# Patient Record
Sex: Female | Born: 1990 | Race: White | Hispanic: No | Smoking: Former smoker
Health system: Southern US, Community
[De-identification: ages and names within clinical notes are randomized; demographics above are authoritative.]

## PROBLEM LIST (undated history)

## (undated) ENCOUNTER — Inpatient Hospital Stay (HOSPITAL_COMMUNITY): Payer: Self-pay

## (undated) DIAGNOSIS — F1010 Alcohol abuse, uncomplicated: Secondary | ICD-10-CM

## (undated) DIAGNOSIS — Z8759 Personal history of other complications of pregnancy, childbirth and the puerperium: Secondary | ICD-10-CM

## (undated) DIAGNOSIS — F410 Panic disorder [episodic paroxysmal anxiety] without agoraphobia: Secondary | ICD-10-CM

## (undated) DIAGNOSIS — F319 Bipolar disorder, unspecified: Secondary | ICD-10-CM

## (undated) DIAGNOSIS — N390 Urinary tract infection, site not specified: Secondary | ICD-10-CM

## (undated) DIAGNOSIS — Z8679 Personal history of other diseases of the circulatory system: Secondary | ICD-10-CM

## (undated) DIAGNOSIS — R87629 Unspecified abnormal cytological findings in specimens from vagina: Secondary | ICD-10-CM

## (undated) DIAGNOSIS — F32A Depression, unspecified: Secondary | ICD-10-CM

## (undated) DIAGNOSIS — D649 Anemia, unspecified: Secondary | ICD-10-CM

## (undated) DIAGNOSIS — A499 Bacterial infection, unspecified: Secondary | ICD-10-CM

## (undated) DIAGNOSIS — B999 Unspecified infectious disease: Secondary | ICD-10-CM

## (undated) DIAGNOSIS — I2699 Other pulmonary embolism without acute cor pulmonale: Secondary | ICD-10-CM

## (undated) DIAGNOSIS — G8929 Other chronic pain: Secondary | ICD-10-CM

## (undated) DIAGNOSIS — N200 Calculus of kidney: Secondary | ICD-10-CM

## (undated) DIAGNOSIS — O24419 Gestational diabetes mellitus in pregnancy, unspecified control: Secondary | ICD-10-CM

## (undated) DIAGNOSIS — R000 Tachycardia, unspecified: Secondary | ICD-10-CM

## (undated) DIAGNOSIS — O99311 Alcohol use complicating pregnancy, first trimester: Secondary | ICD-10-CM

## (undated) DIAGNOSIS — F99 Mental disorder, not otherwise specified: Secondary | ICD-10-CM

## (undated) DIAGNOSIS — Z3490 Encounter for supervision of normal pregnancy, unspecified, unspecified trimester: Secondary | ICD-10-CM

## (undated) DIAGNOSIS — B009 Herpesviral infection, unspecified: Secondary | ICD-10-CM

## (undated) DIAGNOSIS — F329 Major depressive disorder, single episode, unspecified: Secondary | ICD-10-CM

## (undated) HISTORY — DX: Panic disorder (episodic paroxysmal anxiety): F41.0

## (undated) HISTORY — DX: Herpesviral infection, unspecified: B00.9

## (undated) HISTORY — DX: Bipolar disorder, unspecified: F31.9

## (undated) HISTORY — DX: Personal history of other complications of pregnancy, childbirth and the puerperium: Z87.59

## (undated) HISTORY — PX: WISDOM TOOTH EXTRACTION: SHX21

## (undated) HISTORY — DX: Gestational diabetes mellitus in pregnancy, unspecified control: O24.419

## (undated) HISTORY — DX: Mental disorder, not otherwise specified: F99

## (undated) HISTORY — DX: Personal history of other diseases of the circulatory system: Z86.79

## (undated) HISTORY — DX: Alcohol abuse, uncomplicated: F10.10

## (undated) HISTORY — DX: Alcohol use complicating pregnancy, first trimester: O99.311

---
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
## 2012-03-20 ENCOUNTER — Emergency Department: Payer: Self-pay | Admitting: Emergency Medicine

## 2012-04-01 ENCOUNTER — Emergency Department: Payer: Self-pay | Admitting: Emergency Medicine

## 2012-04-11 ENCOUNTER — Emergency Department: Payer: Self-pay | Admitting: Emergency Medicine

## 2012-04-11 LAB — COMPREHENSIVE METABOLIC PANEL
Alkaline Phosphatase: 52 U/L — ABNORMAL LOW (ref 82–169)
Anion Gap: 8 (ref 7–16)
Bilirubin,Total: 0.4 mg/dL (ref 0.2–1.0)
Chloride: 108 mmol/L — ABNORMAL HIGH (ref 98–107)
Co2: 22 mmol/L (ref 21–32)
Creatinine: 0.69 mg/dL (ref 0.60–1.30)
EGFR (African American): >60
EGFR (Non-African Amer.): >60
Osmolality: 277 (ref 275–301)
Potassium: 4.2 mmol/L (ref 3.5–5.1)
Sodium: 138 mmol/L (ref 136–145)

## 2012-04-11 LAB — URINALYSIS, COMPLETE
Bilirubin,UR: NEGATIVE
Glucose,UR: NEGATIVE mg/dL (ref 0–75)
Nitrite: NEGATIVE
Ph: 6 (ref 4.5–8.0)
Specific Gravity: 1.029 (ref 1.003–1.030)
Squamous Epithelial: 11

## 2012-04-11 LAB — CBC
HCT: 40.6 % (ref 35.0–47.0)
MCH: 29 pg (ref 26.0–34.0)
MCHC: 32.9 g/dL (ref 32.0–36.0)
Platelet: 245 10*3/uL (ref 150–440)
RDW: 12.3 % (ref 11.5–14.5)
WBC: 12.1 10*3/uL — ABNORMAL HIGH (ref 3.6–11.0)

## 2012-04-11 LAB — LIPASE, BLOOD: Lipase: 173 U/L (ref 73–393)

## 2012-04-29 ENCOUNTER — Emergency Department: Payer: Self-pay | Admitting: Emergency Medicine

## 2012-06-01 ENCOUNTER — Emergency Department: Payer: Self-pay | Admitting: Emergency Medicine

## 2012-06-02 LAB — COMPREHENSIVE METABOLIC PANEL
Albumin: 4.2 g/dL (ref 3.8–5.6)
Alkaline Phosphatase: 74 U/L — ABNORMAL LOW (ref 82–169)
BUN: 11 mg/dL (ref 7–18)
Bilirubin,Total: 0.3 mg/dL (ref 0.2–1.0)
Calcium, Total: 9.3 mg/dL (ref 9.0–10.7)
Chloride: 106 mmol/L (ref 98–107)
EGFR (African American): >60
EGFR (Non-African Amer.): >60
Osmolality: 275 (ref 275–301)
SGOT(AST): 113 U/L — ABNORMAL HIGH (ref 0–26)
Sodium: 138 mmol/L (ref 136–145)
Total Protein: 7.8 g/dL (ref 6.4–8.6)

## 2012-06-02 LAB — TROPONIN I: Troponin-I: <0.02 ng/mL

## 2012-06-02 LAB — CBC
MCHC: 34.1 g/dL (ref 32.0–36.0)
MCV: 89 fL (ref 80–100)
Platelet: 235 10*3/uL (ref 150–440)

## 2012-07-31 ENCOUNTER — Encounter (HOSPITAL_COMMUNITY): Payer: Self-pay | Admitting: *Deleted

## 2012-07-31 ENCOUNTER — Emergency Department (HOSPITAL_COMMUNITY)
Admission: EM | Admit: 2012-07-31 | Discharge: 2012-07-31 | Disposition: A | Discharge status: Home / Self Care | Payer: Self-pay | Attending: Emergency Medicine | Admitting: Emergency Medicine

## 2012-07-31 DIAGNOSIS — Z79899 Other long term (current) drug therapy: Secondary | ICD-10-CM | POA: Insufficient documentation

## 2012-07-31 DIAGNOSIS — R300 Dysuria: Secondary | ICD-10-CM | POA: Insufficient documentation

## 2012-07-31 DIAGNOSIS — R350 Frequency of micturition: Secondary | ICD-10-CM | POA: Insufficient documentation

## 2012-07-31 DIAGNOSIS — N390 Urinary tract infection, site not specified: Secondary | ICD-10-CM | POA: Insufficient documentation

## 2012-07-31 DIAGNOSIS — Z862 Personal history of diseases of the blood and blood-forming organs and certain disorders involving the immune mechanism: Secondary | ICD-10-CM | POA: Insufficient documentation

## 2012-07-31 DIAGNOSIS — R110 Nausea: Secondary | ICD-10-CM | POA: Insufficient documentation

## 2012-07-31 DIAGNOSIS — Z3202 Encounter for pregnancy test, result negative: Secondary | ICD-10-CM | POA: Insufficient documentation

## 2012-07-31 LAB — URINALYSIS, ROUTINE W REFLEX MICROSCOPIC
Bilirubin Urine: NEGATIVE
Ketones, ur: NEGATIVE mg/dL
Leukocytes, UA: NEGATIVE
Nitrite: NEGATIVE
Specific Gravity, Urine: >1.03 — ABNORMAL HIGH (ref 1.005–1.030)
Urobilinogen, UA: 0.2 mg/dL (ref 0.0–1.0)

## 2012-07-31 LAB — POCT PREGNANCY, URINE: Preg Test, Ur: NEGATIVE

## 2012-07-31 MED ORDER — HYDROCODONE-ACETAMINOPHEN 5-325 MG PO TABS: ORAL_TABLET | ORAL | Status: DC

## 2012-07-31 NOTE — ED Provider Notes (Signed)
History     CSN: [id number]  Arrival date & time 07/31/12  4098   First MD Initiated Contact with Patient 07/31/12 1956      Chief Complaint  Patient presents with  . Back Pain    (Consider location/radiation/quality/duration/timing/severity/associated sxs/prior treatment) HPI Comments: Patient c/o pain to her left lower back for several days.  States she was seen in the ER at another hospital 2 days ago and told she had a UTI.  Was prescribed Cipro but she just started taking the medication on the day prior to this visit.  She states that her friend told her she may have kidney failure and she became afraid and came to the ED for re-evaluation.  She denies fever, vomiting, hematuria or abd pain.  No hx of kidney stones or pyleonephritis  Patient is a 22 y.o. female presenting with back pain. The history is provided by the patient.  Back Pain Location:  Lumbar spine Quality:  Aching Radiates to:  Does not radiate Pain severity:  Mild Pain is:  Same all the time Onset quality:  Gradual Duration:  3 days Timing:  Constant Progression:  Unchanged Chronicity:  New Context comment:  Recently diagnosed with a UTI Relieved by:  Nothing Worsened by:  Movement and twisting Ineffective treatments: tramadol. Associated symptoms: dysuria   Associated symptoms: no abdominal pain, no abdominal swelling, no bladder incontinence, no bowel incontinence, no chest pain, no fever, no headaches, no leg pain, no numbness, no paresthesias, no pelvic pain, no perianal numbness, no tingling and no weakness     Past Medical History  Diagnosis Date  . Anemia   . Urinary tract infection     gets them frequently    Past Surgical History  Procedure Laterality Date  . Dilation and evacuation  11/28/2010    History reviewed. No pertinent family history.  History  Substance Use Topics  . Smoking status: Never Smoker   . Smokeless tobacco: Not on file  . Alcohol Use: Yes     Comment: Marajuanna  use. Not used in 1 month    OB History   Grav Para Term Preterm Abortions TAB SAB Ect Mult Living   2 1 1  0 1 1 0 0 0 1      Review of Systems  Constitutional: Negative for fever, chills, activity change and appetite change.  Cardiovascular: Negative for chest pain.  Gastrointestinal: Positive for nausea. Negative for vomiting, abdominal pain and bowel incontinence.  Genitourinary: Positive for dysuria and frequency. Negative for bladder incontinence, hematuria, flank pain, decreased urine volume, vaginal bleeding, vaginal discharge, difficulty urinating and pelvic pain.  Musculoskeletal: Positive for back pain. Negative for joint swelling.  Skin: Negative for rash.  Neurological: Negative for tingling, weakness, numbness, headaches and paresthesias.  All other systems reviewed and are negative.    Allergies  Review of patient's allergies indicates no known allergies.  Home Medications   Current Outpatient Rx  Name  Route  Sig  Dispense  Refill  . ciprofloxacin (CIPRO) 250 MG tablet   Oral   Take 250 mg by mouth 2 (two) times daily.         . traMADol (ULTRAM) 50 MG tablet   Oral   Take 50 mg by mouth every 6 (six) hours as needed for pain.           BP 123/79  Pulse 86  Temp(Src) 97.5 F (36.4 C) (Oral)  Resp 20  Ht 5\' 3"  (1.6 m)  Wt 105  lb (47.628 kg)  BMI 18.6 kg/m2  SpO2 97%  LMP 07/30/2012  Physical Exam  Nursing note and vitals reviewed. Constitutional: She is oriented to person, place, and time. She appears well-developed and well-nourished. No distress.  HENT:  Head: Normocephalic and atraumatic.  Mouth/Throat: Oropharynx is clear and moist.  Cardiovascular: Normal rate, regular rhythm, normal heart sounds and intact distal pulses.   No murmur heard. Pulmonary/Chest: Effort normal and breath sounds normal. No respiratory distress. She exhibits no tenderness.  Abdominal: Soft. She exhibits no distension. There is no tenderness. There is no rebound  and no guarding.  No CVA tenderness on exam.    Musculoskeletal: She exhibits tenderness.       Lumbar back: She exhibits tenderness. She exhibits normal range of motion, no bony tenderness, no swelling, no edema, no deformity and no laceration.       Back:  Localized ttp of the left lumbar paraspinal muscles.  Distal sensation intact.  SLR is negative on the left. No spinal tenderness   Neurological: She is alert and oriented to person, place, and time. She exhibits normal muscle tone. Coordination normal.  Skin: Skin is warm and dry.    ED Course  Procedures (including critical care time)  Labs Reviewed  URINALYSIS, ROUTINE W REFLEX MICROSCOPIC - Abnormal; Notable for the following:    Specific Gravity, Urine >1.030 (*)    Hgb urine dipstick LARGE (*)    All other components within normal limits  URINE MICROSCOPIC-ADD ON - Abnormal; Notable for the following:    Squamous Epithelial / LPF FEW (*)    Bacteria, UA FEW (*)    All other components within normal limits  URINE CULTURE   Urinalysis is a clean catch specimen and patient started her menses yesterday, which explains the lg amt of blood in the specimen  Urine culture is pending/   MDM    Upon entering the exam room, patient is laughing and talking with her friends.  Lying on the stretcher with her legs crossed.  She is well appearing, NAD.  Requesting a different pain medication stating that the tramadol she was given is not helping her pain.  I doubt kidney stone , pyleo, or emergent neurological process.    Pt agrees to cont abx, norco #8 prescribed.  Agrees to f/u with her PMD./  The patient appears reasonably screened and/or stabilized for discharge and I doubt any other medical condition or other [facility] requiring further screening, evaluation, or treatment in the ED at this time prior to discharge.       Ericah Scotto L. Samaiyah Howes, PA-C 08/02/12 0212  Collins Kerby L. Trisha Mangle, PA-C 08/02/12 0454

## 2012-07-31 NOTE — ED Notes (Signed)
Seen [place] on Sat . And dx with UTI.  Started cipro yesterday,    Nausea, no vomiting.  Today  Cont pain lt side of back

## 2012-07-31 NOTE — ED Notes (Signed)
Pt c/o left side back pain. Pt was seen at [facility] on Saturday and diagnosed with a UTI. Pt was given cipro which she has started. Pt here due to back pain worsening.

## 2012-08-01 LAB — URINE CULTURE: Culture: NO GROWTH

## 2012-08-02 NOTE — ED Provider Notes (Signed)
Medical screening examination/treatment/procedure(s) were performed by non-physician practitioner and as supervising physician I was immediately available for consultation/collaboration.   Shelda Jakes, MD 08/02/12 2015

## 2012-11-07 ENCOUNTER — Encounter (HOSPITAL_COMMUNITY): Payer: Self-pay | Admitting: *Deleted

## 2012-11-07 ENCOUNTER — Emergency Department (HOSPITAL_COMMUNITY)
Admission: EM | Admit: 2012-11-07 | Discharge: 2012-11-08 | Disposition: A | Discharge status: Home / Self Care | Payer: Self-pay | Attending: Emergency Medicine | Admitting: Emergency Medicine

## 2012-11-07 DIAGNOSIS — R059 Cough, unspecified: Secondary | ICD-10-CM | POA: Insufficient documentation

## 2012-11-07 DIAGNOSIS — R110 Nausea: Secondary | ICD-10-CM | POA: Insufficient documentation

## 2012-11-07 DIAGNOSIS — Z79899 Other long term (current) drug therapy: Secondary | ICD-10-CM | POA: Insufficient documentation

## 2012-11-07 DIAGNOSIS — R05 Cough: Secondary | ICD-10-CM | POA: Insufficient documentation

## 2012-11-07 DIAGNOSIS — N949 Unspecified condition associated with female genital organs and menstrual cycle: Secondary | ICD-10-CM | POA: Insufficient documentation

## 2012-11-07 DIAGNOSIS — Z3202 Encounter for pregnancy test, result negative: Secondary | ICD-10-CM | POA: Insufficient documentation

## 2012-11-07 DIAGNOSIS — M549 Dorsalgia, unspecified: Secondary | ICD-10-CM | POA: Insufficient documentation

## 2012-11-07 DIAGNOSIS — N390 Urinary tract infection, site not specified: Secondary | ICD-10-CM | POA: Insufficient documentation

## 2012-11-07 DIAGNOSIS — N898 Other specified noninflammatory disorders of vagina: Secondary | ICD-10-CM | POA: Insufficient documentation

## 2012-11-07 DIAGNOSIS — R102 Pelvic and perineal pain: Secondary | ICD-10-CM

## 2012-11-07 DIAGNOSIS — Z862 Personal history of diseases of the blood and blood-forming organs and certain disorders involving the immune mechanism: Secondary | ICD-10-CM | POA: Insufficient documentation

## 2012-11-07 DIAGNOSIS — R350 Frequency of micturition: Secondary | ICD-10-CM | POA: Insufficient documentation

## 2012-11-07 DIAGNOSIS — Z202 Contact with and (suspected) exposure to infections with a predominantly sexual mode of transmission: Secondary | ICD-10-CM | POA: Insufficient documentation

## 2012-11-07 LAB — URINE MICROSCOPIC-ADD ON

## 2012-11-07 LAB — URINALYSIS, ROUTINE W REFLEX MICROSCOPIC
Bilirubin Urine: NEGATIVE
Nitrite: POSITIVE — AB
Protein, ur: NEGATIVE mg/dL
Specific Gravity, Urine: 1.025 (ref 1.005–1.030)
Urobilinogen, UA: 0.2 mg/dL (ref 0.0–1.0)

## 2012-11-07 NOTE — ED Notes (Signed)
Flank pain x 3 weeks. Pt reports vaginal discharge.

## 2012-11-07 NOTE — ED Notes (Signed)
Pain in right flank for 3 weeks, also has a vaginal discharge, wishes to be checked for STD and UTI

## 2012-11-08 ENCOUNTER — Other Ambulatory Visit (HOSPITAL_COMMUNITY): Payer: Self-pay | Admitting: Nurse Practitioner

## 2012-11-08 ENCOUNTER — Ambulatory Visit (HOSPITAL_COMMUNITY)
Admit: 2012-11-08 | Discharge: 2012-11-08 | Disposition: A | Discharge status: Home / Self Care | Payer: Self-pay | Attending: Obstetrics and Gynecology | Admitting: Obstetrics and Gynecology

## 2012-11-08 DIAGNOSIS — R102 Pelvic and perineal pain: Secondary | ICD-10-CM

## 2012-11-08 DIAGNOSIS — N949 Unspecified condition associated with female genital organs and menstrual cycle: Secondary | ICD-10-CM | POA: Insufficient documentation

## 2012-11-08 LAB — CBC WITH DIFFERENTIAL/PLATELET
Hemoglobin: 11.7 g/dL — ABNORMAL LOW (ref 12.0–15.0)
Lymphocytes Relative: 29 % (ref 12–46)
Lymphs Abs: 2.4 10*3/uL (ref 0.7–4.0)
Monocytes Relative: 7 % (ref 3–12)
Neutro Abs: 5.3 10*3/uL (ref 1.7–7.7)
Neutrophils Relative %: 63 % (ref 43–77)
Platelets: 245 10*3/uL (ref 150–400)
RBC: 3.85 MIL/uL — ABNORMAL LOW (ref 3.87–5.11)
WBC: 8.5 10*3/uL (ref 4.0–10.5)

## 2012-11-08 LAB — WET PREP, GENITAL: Trich, Wet Prep: NONE SEEN

## 2012-11-08 MED ORDER — METRONIDAZOLE 500 MG PO TABS: 500.0000 mg | ORAL_TABLET | Freq: Two times a day (BID) | ORAL | Status: DC

## 2012-11-08 MED ORDER — AZITHROMYCIN 250 MG PO TABS
1000.0000 mg | ORAL_TABLET | Freq: Once | ORAL | Status: AC
  Administered 2012-11-08: 1000 mg via ORAL
  Filled 2012-11-08: qty 4

## 2012-11-08 MED ORDER — CIPROFLOXACIN HCL 500 MG PO TABS: 500.0000 mg | ORAL_TABLET | Freq: Two times a day (BID) | ORAL | Status: DC

## 2012-11-08 MED ORDER — CEFTRIAXONE SODIUM 250 MG IJ SOLR
250.0000 mg | Freq: Once | INTRAMUSCULAR | Status: AC
  Administered 2012-11-08: 250 mg via INTRAMUSCULAR
  Filled 2012-11-08: qty 250

## 2012-11-08 MED ORDER — LIDOCAINE HCL (PF) 1 % IJ SOLN
INTRAMUSCULAR | Status: AC
  Filled 2012-11-08: qty 5

## 2012-11-08 NOTE — Progress Notes (Signed)
4:39 PM Pt had pelvic ultrasound, as ordered last night, which was negative.  Pt so advised.
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
## 2012-11-08 NOTE — ED Notes (Signed)
Pt alert & oriented x4, stable gait. Patient given discharge instructions, paperwork & prescription(s). Patient  instructed to stop at the registration desk to finish any additional paperwork. Patient verbalized understanding. Pt left department w/ no further questions. 

## 2012-11-09 LAB — URINE CULTURE

## 2012-11-09 LAB — GC/CHLAMYDIA PROBE AMP: CT Probe RNA: NEGATIVE

## 2012-11-10 NOTE — ED Notes (Signed)
+  gonorrhea Patient treated with Rocephin And Zithromax-[facility] faxed 

## 2012-11-10 NOTE — ED Notes (Signed)
Post ED Visit - Positive Culture Follow-up  Culture report reviewed by antimicrobial stewardship pharmacist: []  Wes Dulaney, Pharm.D., BCPS [x]  Celedonio Miyamoto, Pharm.D., BCPS []  Georgina Pillion, [address].D., BCPS []  [place], [place].D., BCPS, AAHIVP []  Estella Husk, Pharm.D., BCPS, AAHIVP  Positive urine culture Treated with Cipro, organism sensitive to the same and no further patient follow-up is required at this time.  Larena Sox 11/10/2012, 2:54 PM

## 2012-11-11 NOTE — ED Provider Notes (Signed)
Medical screening examination/treatment/procedure(s) were performed by non-physician practitioner and as supervising physician I was immediately available for consultation/collaboration.  Neetu Carrozza S. Camri Molloy, MD 11/11/12 0746 

## 2012-11-12 ENCOUNTER — Telehealth (HOSPITAL_COMMUNITY): Payer: Self-pay | Admitting: Emergency Medicine

## 2012-11-14 ENCOUNTER — Telehealth: Payer: Self-pay | Admitting: Obstetrics and Gynecology

## 2012-11-14 NOTE — Telephone Encounter (Signed)
Left message for patent to call office regarding tests., and phone # of office.

## 2012-11-15 ENCOUNTER — Telehealth (HOSPITAL_COMMUNITY): Payer: Self-pay | Admitting: Emergency Medicine

## 2012-11-15 ENCOUNTER — Telehealth: Payer: Self-pay | Admitting: Obstetrics and Gynecology

## 2012-11-15 NOTE — ED Notes (Signed)
Patient informed of positive results after id'd x 2 and informed of need to notify partner to be treated. 

## 2013-05-17 NOTE — L&D Delivery Note (Signed)
Delivery Note At 5:08 PM a viable female was delivered via  (Presentation:LOA  ).  APGAR: 8,9 ; weight pending.   Placenta status: intact ,spontaneous .  Cord: 3 vessel with the following complications: none .    Anesthesia: Epidural  Episiotomy: None Lacerations: None Suture Repair: N/A Est. Blood Loss (mL): 300  Mom to postpartum.  Baby to Couplet care / Skin to Skin.  Called to delivery. Mother pushed over 1.5 hrs. Recurrent variable decels to 60's, however infant progressed well without incident. Infant delivered to maternal abdomen. Delayed cord clamping performed. Cord clamped and cut. Active management of 3rd stage with traction and Pitocin. Placenta delivered intact with 3v cord. No tears.EBL 300cc. Counts correct. Hemostatic.    [place], [place] Ram 12/18/2013, 5:21 PM    I have seen and examined this patient and agree with above documentation in the resident's note. Dr. Roselie Awkward present for delivery as well.  Nila Nephew, MD OB Fellow 12/19/2013 12:13 AM

## 2013-05-22 ENCOUNTER — Encounter (HOSPITAL_COMMUNITY): Payer: Self-pay | Admitting: *Deleted

## 2013-05-22 ENCOUNTER — Inpatient Hospital Stay (HOSPITAL_COMMUNITY)
Admission: AD | Admit: 2013-05-22 | Discharge: 2013-05-22 | Disposition: A | Discharge status: Home / Self Care | Payer: Self-pay | Source: Ambulatory Visit | Attending: Obstetrics and Gynecology | Admitting: Obstetrics and Gynecology

## 2013-05-22 ENCOUNTER — Inpatient Hospital Stay (HOSPITAL_COMMUNITY): Payer: Medicaid Other

## 2013-05-22 DIAGNOSIS — O26899 Other specified pregnancy related conditions, unspecified trimester: Secondary | ICD-10-CM

## 2013-05-22 DIAGNOSIS — O210 Mild hyperemesis gravidarum: Secondary | ICD-10-CM | POA: Insufficient documentation

## 2013-05-22 DIAGNOSIS — R109 Unspecified abdominal pain: Secondary | ICD-10-CM

## 2013-05-22 DIAGNOSIS — R1031 Right lower quadrant pain: Secondary | ICD-10-CM | POA: Insufficient documentation

## 2013-05-22 DIAGNOSIS — N644 Mastodynia: Secondary | ICD-10-CM | POA: Insufficient documentation

## 2013-05-22 DIAGNOSIS — O9989 Other specified diseases and conditions complicating pregnancy, childbirth and the puerperium: Principal | ICD-10-CM

## 2013-05-22 DIAGNOSIS — O99891 Other specified diseases and conditions complicating pregnancy: Secondary | ICD-10-CM | POA: Insufficient documentation

## 2013-05-22 DIAGNOSIS — Z87891 Personal history of nicotine dependence: Secondary | ICD-10-CM | POA: Insufficient documentation

## 2013-05-22 LAB — CBC
HCT: 33.7 % — ABNORMAL LOW (ref 36.0–46.0)
Hemoglobin: 11.6 g/dL — ABNORMAL LOW (ref 12.0–15.0)
MCH: 30.1 pg (ref 26.0–34.0)
MCHC: 34.4 g/dL (ref 30.0–36.0)
MCV: 87.3 fL (ref 78.0–100.0)
PLATELETS: 225 10*3/uL (ref 150–400)
RBC: 3.86 MIL/uL — AB (ref 3.87–5.11)
RDW: 12.4 % (ref 11.5–15.5)
WBC: 8.2 10*3/uL (ref 4.0–10.5)

## 2013-05-22 LAB — URINALYSIS, ROUTINE W REFLEX MICROSCOPIC
BILIRUBIN URINE: NEGATIVE
Glucose, UA: NEGATIVE mg/dL
Hgb urine dipstick: NEGATIVE
KETONES UR: NEGATIVE mg/dL
Leukocytes, UA: NEGATIVE
NITRITE: NEGATIVE
PH: 7.5 (ref 5.0–8.0)
Protein, ur: NEGATIVE mg/dL
Specific Gravity, Urine: 1.02 (ref 1.005–1.030)
UROBILINOGEN UA: 0.2 mg/dL (ref 0.0–1.0)

## 2013-05-22 LAB — WET PREP, GENITAL
CLUE CELLS WET PREP: NONE SEEN
Trich, Wet Prep: NONE SEEN
Yeast Wet Prep HPF POC: NONE SEEN

## 2013-05-22 LAB — HCG, QUANTITATIVE, PREGNANCY: hCG, Beta Chain, Quant, S: 130754 m[IU]/mL — ABNORMAL HIGH (ref <5)

## 2013-05-22 LAB — POCT PREGNANCY, URINE: Preg Test, Ur: POSITIVE — AB

## 2013-05-22 NOTE — Discharge Instructions (Signed)
Abdominal Pain During Pregnancy Abdominal discomfort is common in pregnancy. Most of the time, it does not cause harm. There are many causes of abdominal pain. Some causes are more serious than others. Some of the causes of abdominal pain in pregnancy are easily diagnosed. Occasionally, the diagnosis takes time to understand. Other times, the cause is not determined. Abdominal pain can be a sign that something is very wrong with the pregnancy, or the pain may have nothing to do with the pregnancy at all. For this reason, always tell your caregiver if you have any abdominal discomfort. CAUSES Common and harmless causes of abdominal pain include:  Constipation.  Excess gas and bloating.  Round ligament pain. This is pain that is felt in the folds of the groin.  The position the baby or placenta is in.  Baby kicks.  Braxton-Hicks contractions. These are mild contractions that do not cause cervical dilation. Serious causes of abdominal pain include:  Ectopic pregnancy. This happens when a fertilized egg implants outside of the uterus.  Miscarriage.  Preterm labor. This is when labor starts at less than 37 weeks of pregnancy.  Placental abruption. This is when the placenta partially or completely separates from the uterus.  Preeclampsia. This is often associated with high blood pressure and has been referred to as "toxemia in pregnancy."  Uterine or amniotic fluid infections. Causes unrelated to pregnancy include:  Urinary tract infection.  Gallbladder stones or inflammation.  Hepatitis or other liver illness.  Intestinal problems, stomach flu, food poisoning, or ulcer.  Appendicitis.  Kidney (renal) stones.  Kidney infection (pylonephritis). HOME CARE INSTRUCTIONS  For mild pain:  Do not have sexual intercourse or put anything in your vagina until your symptoms go away completely.  Get plenty of rest until your pain improves. If your pain does not improve in 1 hour, call  your caregiver.  Drink clear fluids if you feel nauseous. Avoid solid food as long as you are uncomfortable or nauseous.  Only take medicine as directed by your caregiver.  Keep all follow-up appointments with your caregiver. SEEK IMMEDIATE MEDICAL CARE IF:  You are bleeding, leaking fluid, or passing tissue from the vagina.  You have increasing pain or cramping.  You have persistent vomiting.  You have painful or bloody urination.  You have a fever.  You notice a decrease in your baby's movements.  You have extreme weakness or feel faint.  You have shortness of breath, with or without abdominal pain.  You develop a severe headache with abdominal pain.  You have abnormal vaginal discharge with abdominal pain.  You have persistent diarrhea.  You have abdominal pain that continues even after rest, or gets worse. MAKE SURE YOU:   Understand these instructions.  Will watch your condition.  Will get help right away if you are not doing well or get worse. Document Released: 05/03/2005 Document Revised: 07/26/2011 Document Reviewed: 11/30/2012 [facility] Patient Information 2014 [place], [place].

## 2013-05-22 NOTE — Progress Notes (Signed)
Pt states pain is off and on and she knows she can take Tylenol

## 2013-05-22 NOTE — MAU Provider Note (Signed)
History     CSN: [id number]  Arrival date and time: 05/22/13 1257   First Provider Initiated Contact with Patient 05/22/13 1544      Chief Complaint  Patient presents with  . Abdominal Pain  . Possible Pregnancy   HPI Patricia Richards is a 23 year old G38P1011 female at 7-8 weeks presenting to MAU c/o RLQ abdominal pain x 2-3 weeks.  Described as alternating b/t sharp and dull, intermittent, and radiating to her back.  Also admits to nausea, vomiting, breast pain and tenderness.  Denies fever, chest pain, SOB, vaginal bleeding, vaginal discharge, dysuria, vaginal itching, breast redness or discharge.  No medications.  Patient was seen at another hospital 4 weeks ago for this same pain.  [place] did not show "anything" so she was advised to return if abdominal pain came back - per patient.  She was also treated for BV with metronidazole at this time.  She is accompanied today by the father of her baby.   Past Medical History  Diagnosis Date  . Anemia   . Urinary tract infection     gets them frequently    Past Surgical History  Procedure Laterality Date  . Dilation and evacuation  11/28/2010    History reviewed. No pertinent family history.  History  Substance Use Topics  . Smoking status: Former Research scientist (life sciences)  . Smokeless tobacco: Not on file  . Alcohol Use: Yes     Comment: Marajuanna use. Not used in 1 month    Allergies: No Known Allergies  No prescriptions prior to admission    ROS Physical Exam   Blood pressure 118/61, pulse 86, temperature 98.5 F (36.9 C), temperature source Oral, resp. rate 16, height 5\' 3"  (1.6 m), weight 49.533 kg (109 lb 3.2 oz), last menstrual period 03/01/2013, SpO2 98.00%.  Physical Exam  Constitutional: She is oriented to person, place, and time. She appears well-developed and well-nourished. No distress.  HENT:  Head: Normocephalic.  Neck: Normal range of motion. Neck supple.  Cardiovascular: Normal rate, regular rhythm and normal heart sounds.   Exam reveals no gallop and no friction rub.   No murmur heard. Respiratory: Effort normal and breath sounds normal. No respiratory distress.  GI: Soft. She exhibits no mass. There is tenderness (generalized, most severe in RUQ and RLQ; negative murphy's sign, psoas sign, obturator sign). There is no rebound and no guarding.  Genitourinary: Uterus is enlarged. Right adnexum displays no mass, no tenderness and no fullness. Left adnexum displays no mass, no tenderness and no fullness. No bleeding around the vagina.  Musculoskeletal: Normal range of motion.  Neurological: She is alert and oriented to person, place, and time.  Skin: Skin is warm and dry.     MAU Course  Procedures Results for orders placed during the hospital encounter of 05/22/13 (from the past 24 hour(s))  URINALYSIS, ROUTINE W REFLEX MICROSCOPIC     Status: Abnormal   Collection Time    05/22/13  1:33 PM      Result Value Range   Color, Urine YELLOW  YELLOW   APPearance CLOUDY (*) CLEAR   Specific Gravity, Urine 1.020  1.005 - 1.030   pH 7.5  5.0 - 8.0   Glucose, UA NEGATIVE  NEGATIVE mg/dL   Hgb urine dipstick NEGATIVE  NEGATIVE   Bilirubin Urine NEGATIVE  NEGATIVE   Ketones, ur NEGATIVE  NEGATIVE mg/dL   Protein, ur NEGATIVE  NEGATIVE mg/dL   Urobilinogen, UA 0.2  0.0 - 1.0 mg/dL  Nitrite NEGATIVE  NEGATIVE   Leukocytes, UA NEGATIVE  NEGATIVE  POCT PREGNANCY, URINE     Status: Abnormal   Collection Time    05/22/13  2:35 PM      Result Value Range   Preg Test, Ur POSITIVE (*) NEGATIVE  WET PREP, GENITAL     Status: Abnormal   Collection Time    05/22/13  4:00 PM      Result Value Range   Yeast Wet Prep HPF POC NONE SEEN  NONE SEEN   Trich, Wet Prep NONE SEEN  NONE SEEN   Clue Cells Wet Prep HPF POC NONE SEEN  NONE SEEN   WBC, Wet Prep HPF POC FEW (*) NONE SEEN  CBC     Status: Abnormal   Collection Time    05/22/13  4:39 PM      Result Value Range   WBC 8.2  4.0 - 10.5 K/uL   RBC 3.86 (*) 3.87 - 5.11  MIL/uL   Hemoglobin 11.6 (*) 12.0 - 15.0 g/dL   HCT 33.7 (*) 36.0 - 46.0 %   MCV 87.3  78.0 - 100.0 fL   MCH 30.1  26.0 - 34.0 pg   MCHC 34.4  30.0 - 36.0 g/dL   RDW 12.4  11.5 - 15.5 %   Platelets 225  150 - 400 K/uL   Ultrasound: FINDINGS:  Intrauterine gestational sac: Visualized/normal in shape.  Yolk sac: Visualized.  Embryo: Visualized.  Cardiac Activity: Visualized.  Heart Rate: 172 bpm  CRL: 25.1 mm 9 w 2 d [place] EDC: 12/23/2013  Maternal uterus/adnexae: There is no evidence of subchorionic  hematoma. Both maternal ovaries are visualized and appear normal.  There is no adnexal mass or free pelvic fluid.  IMPRESSION:  Single live intrauterine pregnancy with best estimated gestational  age of [redacted] weeks 2 days. No fetal, placental or adnexal abnormalities  identified.  Assessment and Plan  23 yo G3P1011 at 9.2 wks IUP Abdominal Pain in Pregnancy - Normal Exam  Plan: Discharge to home Provided reassurance Begin prenatal care > message routed to clinic   [facility] 05/22/2013, 4:23 PM

## 2013-05-22 NOTE — MAU Note (Signed)
Pt states her stomach is "hurting really bad on my right side" pt states she has had this pain for 3wks

## 2013-05-22 NOTE — MAU Note (Signed)
Patient states she has had a positive home pregnancy test. States she was seen in [place] 3-4 weeks ago and had an ultrasound with a gestational sac. States she has been having pain since that time. Denies bleeding but has a vaginal discharge. Burns with urination and urine has a strong smell. Was treated for BS when she was in [place].

## 2013-05-23 LAB — GC/CHLAMYDIA PROBE AMP
CT Probe RNA: NEGATIVE
GC Probe RNA: NEGATIVE

## 2013-05-23 NOTE — MAU Provider Note (Signed)
Attestation of Attending Supervision of Advanced Practitioner (CNM/NP): Evaluation and management procedures were performed by the Advanced Practitioner under my supervision and collaboration.  I have reviewed the Advanced Practitioner's note and chart, and I agree with the management and plan.  Patricia Richards 05/23/2013 7:30 AM

## 2013-06-15 ENCOUNTER — Inpatient Hospital Stay (HOSPITAL_COMMUNITY)
Admission: AD | Admit: 2013-06-15 | Discharge: 2013-06-15 | Disposition: A | Discharge status: Home / Self Care | Payer: Self-pay | Source: Ambulatory Visit | Attending: Obstetrics & Gynecology | Admitting: Obstetrics & Gynecology

## 2013-06-15 ENCOUNTER — Encounter (HOSPITAL_COMMUNITY): Payer: Self-pay

## 2013-06-15 DIAGNOSIS — Z5900 Homelessness unspecified: Secondary | ICD-10-CM | POA: Insufficient documentation

## 2013-06-15 DIAGNOSIS — Z87891 Personal history of nicotine dependence: Secondary | ICD-10-CM | POA: Insufficient documentation

## 2013-06-15 DIAGNOSIS — N760 Acute vaginitis: Secondary | ICD-10-CM | POA: Insufficient documentation

## 2013-06-15 DIAGNOSIS — O2390 Unspecified genitourinary tract infection in pregnancy, unspecified trimester: Secondary | ICD-10-CM | POA: Insufficient documentation

## 2013-06-15 DIAGNOSIS — A499 Bacterial infection, unspecified: Secondary | ICD-10-CM

## 2013-06-15 DIAGNOSIS — R319 Hematuria, unspecified: Secondary | ICD-10-CM | POA: Insufficient documentation

## 2013-06-15 DIAGNOSIS — Z3492 Encounter for supervision of normal pregnancy, unspecified, second trimester: Secondary | ICD-10-CM

## 2013-06-15 DIAGNOSIS — B9689 Other specified bacterial agents as the cause of diseases classified elsewhere: Secondary | ICD-10-CM | POA: Insufficient documentation

## 2013-06-15 DIAGNOSIS — R109 Unspecified abdominal pain: Secondary | ICD-10-CM | POA: Insufficient documentation

## 2013-06-15 LAB — URINALYSIS, ROUTINE W REFLEX MICROSCOPIC
BILIRUBIN URINE: NEGATIVE
Glucose, UA: NEGATIVE mg/dL
Ketones, ur: 15 mg/dL — AB
NITRITE: NEGATIVE
PH: 6 (ref 5.0–8.0)
Protein, ur: 30 mg/dL — AB
Urobilinogen, UA: 0.2 mg/dL (ref 0.0–1.0)

## 2013-06-15 LAB — WET PREP, GENITAL
TRICH WET PREP: NONE SEEN
YEAST WET PREP: NONE SEEN

## 2013-06-15 LAB — URINE MICROSCOPIC-ADD ON

## 2013-06-15 LAB — POCT FERN TEST: POCT Fern Test: NEGATIVE — NL

## 2013-06-15 MED ORDER — CEPHALEXIN 500 MG PO CAPS: 500.0000 mg | ORAL_CAPSULE | Freq: Four times a day (QID) | ORAL | Status: DC

## 2013-06-15 MED ORDER — METRONIDAZOLE 500 MG PO TABS: 500.0000 mg | ORAL_TABLET | Freq: Two times a day (BID) | ORAL | Status: DC

## 2013-06-15 NOTE — MAU Provider Note (Signed)
Attestation of Attending Supervision of Advanced Practitioner (CNM/NP): Evaluation and management procedures were performed by the Advanced Practitioner under my supervision and collaboration. I have reviewed the Advanced Practitioner's note and chart, and I agree with the management and plan.  Karesa Maultsby H. 4:55 PM   

## 2013-06-15 NOTE — Discharge Instructions (Signed)
Bacterial Vaginosis Bacterial vaginosis is an infection of the vagina. It happens when too many of certain germs (bacteria) grow in the vagina. HOME CARE  Take your medicine as told by your doctor.  Finish your medicine even if you start to feel better.  Do not have sex until you finish your medicine and are better.  Tell your sex partner that you have an infection. They should see their doctor for treatment.  Practice safe sex. Use condoms. Have only one sex partner. GET HELP IF:  You are not getting better after 3 days of treatment.  You have more grey fluid (discharge) coming from your vagina than before.  You have more pain than before.  You have a fever. MAKE SURE YOU:   Understand these instructions.  Will watch your condition.  Will get help right away if you are not doing well or get worse. Document Released: 02/10/2008 Document Revised: 02/21/2013 Document Reviewed: 12/13/2012 [facility] Patient Information 2014 [place]. Urinary Tract Infection A urinary tract infection (UTI) can occur any place along the urinary tract. The tract includes the kidneys, ureters, bladder, and urethra. A type of germ called bacteria often causes a UTI. UTIs are often helped with antibiotic medicine.  HOME CARE   If given, take antibiotics as told by your doctor. Finish them even if you start to feel better.  Drink enough fluids to keep your pee (urine) clear or pale yellow.  Avoid tea, drinks with caffeine, and bubbly (carbonated) drinks.  Pee often. Avoid holding your pee in for a long time.  Pee before and after having sex (intercourse).  Wipe from front to back after you poop (bowel movement) if you are a woman. Use each tissue only once. GET HELP RIGHT AWAY IF:   You have back pain.  You have lower belly (abdominal) pain.  You have chills.  You feel sick to your stomach (nauseous).  You throw up (vomit).  Your burning or discomfort with peeing does not go  away.  You have a fever.  Your symptoms are not better in 3 days. MAKE SURE YOU:   Understand these instructions.  Will watch your condition.  Will get help right away if you are not doing well or get worse. Document Released: 10/20/2007 Document Revised: 01/26/2012 Document Reviewed: 12/02/2011 [facility] Patient Information 2014 [place], [place].

## 2013-06-15 NOTE — MAU Note (Signed)
SW called to see pt.

## 2013-06-15 NOTE — MAU Note (Signed)
Pt states here for ?leaking of fluid. Was on ladder and put arms up, felt gush of fluid come out.

## 2013-06-15 NOTE — MAU Provider Note (Signed)
History     CSN: [id number]  Arrival date and time: 06/15/13 1315   First Provider Initiated Contact with Patient 06/15/13 1358      Chief Complaint  Patient presents with  . Vaginal Discharge   HPI Patricia Richards is 23 y.o. G3P1011 [redacted]w[redacted]d weeks presenting with ? Leaking of fluid.  She was on a ladder and felt a gush of fluid. Spotting at the same time.  She did not fall.  Just stepped up and clear fluid came out.  Fluid had an odor. Having pressure and cramping.  Last intercourse was last night.  She has not begun prenatal care because she isn't sure if she will in [place] or [place].  Will know in 2 weeks where she will leave.  She then reported she is homeless.     Past Medical History  Diagnosis Date  . Anemia   . Urinary tract infection     gets them frequently    Past Surgical History  Procedure Laterality Date  . Dilation and evacuation  11/28/2010    History reviewed. No pertinent family history.  History  Substance Use Topics  . Smoking status: Former Research scientist (life sciences)  . Smokeless tobacco: Not on file  . Alcohol Use: Yes     Comment: Marajuanna use. Not used in 1 month    Allergies: No Known Allergies  Prescriptions prior to admission  Medication Sig Dispense Refill  . acetaminophen (TYLENOL) 500 MG tablet Take 500 mg by mouth every 6 (six) hours as needed for headache.      . ibuprofen (ADVIL,MOTRIN) 200 MG tablet Take 200 mg by mouth every 6 (six) hours as needed for moderate pain.      . Prenatal Vit-Fe Fumarate-FA (PRENATAL MULTIVITAMIN) TABS tablet Take 1 tablet by mouth daily at 12 noon.        Review of Systems  Constitutional: Negative for fever and chills.  Gastrointestinal: Positive for abdominal pain (mild cramping). Negative for nausea and vomiting.  Genitourinary: Negative for dysuria, urgency, frequency and hematuria.       Spotting noted at the time fluid was seen  Neurological: Negative for headaches.   Physical Exam   Blood pressure 114/71,  pulse 91, temperature 98.1 F (36.7 C), temperature source Oral, resp. rate 18, height 5\' 3"  (1.6 m), weight 109 lb 6 oz (49.612 kg), last menstrual period 03/01/2013.  Physical Exam  Constitutional: She is oriented to person, place, and time. She appears well-developed and well-nourished. No distress.  HENT:  Head: Normocephalic.  Neck: Normal range of motion.  Cardiovascular: Normal rate.   Respiratory: Effort normal.  GI: Soft. She exhibits no distension and no mass. There is no tenderness. There is no rebound and no guarding.  Genitourinary:  Neg for pooling of fluid.  Neg for abnormal discharge or blood.  Cervix is closed, thick.  Neurological: She is alert and oriented to person, place, and time.  Skin: Skin is warm and dry.  Psychiatric: She has a normal mood and affect. Her behavior is normal.   Fetal heart rate by doppler 166.  Results for orders placed during the hospital encounter of 06/15/13 (from the past 24 hour(s))  URINALYSIS, ROUTINE W REFLEX MICROSCOPIC     Status: Abnormal   Collection Time    06/15/13  1:30 PM      Result Value Range   Color, Urine YELLOW  YELLOW   APPearance CLOUDY (*) CLEAR   Specific Gravity, Urine >1.030 (*) 1.005 - 1.030   pH  6.0  5.0 - 8.0   Glucose, UA NEGATIVE  NEGATIVE mg/dL   Hgb urine dipstick TRACE (*) NEGATIVE   Bilirubin Urine NEGATIVE  NEGATIVE   Ketones, ur 15 (*) NEGATIVE mg/dL   Protein, ur 30 (*) NEGATIVE mg/dL   Urobilinogen, UA 0.2  0.0 - 1.0 mg/dL   Nitrite NEGATIVE  NEGATIVE   Leukocytes, UA SMALL (*) NEGATIVE  URINE MICROSCOPIC-ADD ON     Status: Abnormal   Collection Time    06/15/13  1:30 PM      Result Value Range   Squamous Epithelial / LPF MANY (*) RARE   WBC, UA 21-50  <3 WBC/hpf   RBC / HPF 0-2  <3 RBC/hpf   Bacteria, UA FEW (*) RARE   Urine-Other MUCOUS PRESENT    WET PREP, GENITAL     Status: Abnormal   Collection Time    06/15/13  2:20 PM      Result Value Range   Yeast Wet Prep HPF POC NONE SEEN   NONE SEEN   Trich, Wet Prep NONE SEEN  NONE SEEN   Clue Cells Wet Prep HPF POC FEW (*) NONE SEEN   WBC, Wet Prep HPF POC MODERATE (*) NONE SEEN   FERN--NEGATIVE  MAU Course  Procedures  GC/CHL Neg on 05/22/13.   UA reflexed to urine culture--pending  MDM Social work consult   Came in to see patient.  Verification letter given to patient to begin Medicaid process.   Assessment and Plan  A:  Hematuria       Bacterial vaginitis      [redacted]w[redacted]d gestation  P:  Rx for Flagyl and Keflex to pharmacy       These are $4 Rx at [facility]      Instructed patient to compete medications      Encouraged her to begin prenatal care as soon as she can         Louis Ivery,EVE M 06/15/2013, 3:16 PM

## 2013-06-16 LAB — URINE CULTURE: Colony Count: >=100000

## 2013-07-23 ENCOUNTER — Emergency Department (HOSPITAL_COMMUNITY)
Admission: EM | Admit: 2013-07-23 | Discharge: 2013-07-23 | Disposition: A | Discharge status: Home / Self Care | Payer: Medicaid Other | Attending: Emergency Medicine | Admitting: Emergency Medicine

## 2013-07-23 ENCOUNTER — Encounter (HOSPITAL_COMMUNITY): Payer: Self-pay | Admitting: Emergency Medicine

## 2013-07-23 DIAGNOSIS — Z792 Long term (current) use of antibiotics: Secondary | ICD-10-CM | POA: Diagnosis not present

## 2013-07-23 DIAGNOSIS — Z3490 Encounter for supervision of normal pregnancy, unspecified, unspecified trimester: Secondary | ICD-10-CM

## 2013-07-23 DIAGNOSIS — Z79899 Other long term (current) drug therapy: Secondary | ICD-10-CM | POA: Diagnosis not present

## 2013-07-23 DIAGNOSIS — O2390 Unspecified genitourinary tract infection in pregnancy, unspecified trimester: Secondary | ICD-10-CM | POA: Insufficient documentation

## 2013-07-23 DIAGNOSIS — Z87891 Personal history of nicotine dependence: Secondary | ICD-10-CM | POA: Diagnosis not present

## 2013-07-23 DIAGNOSIS — M545 Low back pain, unspecified: Secondary | ICD-10-CM | POA: Insufficient documentation

## 2013-07-23 DIAGNOSIS — Z87440 Personal history of urinary (tract) infections: Secondary | ICD-10-CM | POA: Insufficient documentation

## 2013-07-23 DIAGNOSIS — Z862 Personal history of diseases of the blood and blood-forming organs and certain disorders involving the immune mechanism: Secondary | ICD-10-CM | POA: Diagnosis not present

## 2013-07-23 DIAGNOSIS — N72 Inflammatory disease of cervix uteri: Secondary | ICD-10-CM | POA: Insufficient documentation

## 2013-07-23 HISTORY — DX: Encounter for supervision of normal pregnancy, unspecified, unspecified trimester: Z34.90

## 2013-07-23 LAB — URINE MICROSCOPIC-ADD ON

## 2013-07-23 LAB — URINALYSIS, ROUTINE W REFLEX MICROSCOPIC
BILIRUBIN URINE: NEGATIVE
GLUCOSE, UA: NEGATIVE mg/dL
KETONES UR: NEGATIVE mg/dL
LEUKOCYTES UA: NEGATIVE
Nitrite: NEGATIVE
PH: 6.5 (ref 5.0–8.0)
PROTEIN: NEGATIVE mg/dL
Specific Gravity, Urine: 1.025 (ref 1.005–1.030)
Urobilinogen, UA: 0.2 mg/dL (ref 0.0–1.0)

## 2013-07-23 LAB — WET PREP, GENITAL
TRICH WET PREP: NONE SEEN
Yeast Wet Prep HPF POC: NONE SEEN

## 2013-07-23 MED ORDER — STERILE WATER FOR INJECTION IJ SOLN
INTRAMUSCULAR | Status: AC
  Administered 2013-07-23: 18:00:00
  Filled 2013-07-23: qty 10

## 2013-07-23 MED ORDER — CEFTRIAXONE SODIUM 250 MG IJ SOLR
250.0000 mg | Freq: Once | INTRAMUSCULAR | Status: AC
  Administered 2013-07-23: 250 mg via INTRAMUSCULAR
  Filled 2013-07-23: qty 250

## 2013-07-23 MED ORDER — AZITHROMYCIN 250 MG PO TABS
1000.0000 mg | ORAL_TABLET | Freq: Once | ORAL | Status: AC
  Administered 2013-07-23: 1000 mg via ORAL
  Filled 2013-07-23: qty 4

## 2013-07-23 NOTE — ED Provider Notes (Addendum)
CSN: [id number]     Arrival date & time 07/23/13  1538 History  This chart was scribed for Orpah Greek, MD,  by Stacy Gardner, ED Scribe. The patient was seen in room APA11/APA11 and the patient's care was started at 4:14 PM.    First MD Initiated Contact with Patient 07/23/13 1604     Chief Complaint  Patient presents with  . Abdominal Pain     (Consider location/radiation/quality/duration/timing/severity/associated sxs/prior Treatment) Patient is a 23 y.o. female presenting with abdominal pain. The history is provided by the patient and medical records. No language interpreter was used.  Abdominal Pain Pain location:  RLQ Timing:  Constant Progression:  Unchanged Relieved by:  Nothing  HPI Comments: Marcel Sorter is a 23 y.o. female who is 5 months pregnant presents to the Emergency Department complaining of persistent moderate RLQ pain for the past three months which has gotten worse over the past two weeks. She has the associated symptoms of constant lumbar mild back pain and urinary frequency. She was recently treated for a UTI with antibiotics. She has finished her medications. Denies seeing a OB/GYN. Nothing seems to relieve her symptoms.  Past Medical History  Diagnosis Date  . Anemia   . Urinary tract infection     gets them frequently  . Pregnant    Past Surgical History  Procedure Laterality Date  . Dilation and evacuation  11/28/2010   No family history on file. History  Substance Use Topics  . Smoking status: Former Research scientist (life sciences)  . Smokeless tobacco: Not on file  . Alcohol Use: Yes     Comment: Last use of marijuana was 1 mo. ago per pt. 07/23/13   OB History   Grav Para Term Preterm Abortions TAB SAB Ect Mult Living   3 1 1  0 1 1 0 0 0 1     Review of Systems  Gastrointestinal: Positive for abdominal pain.  Genitourinary: Positive for frequency.  Musculoskeletal: Positive for back pain.  All other systems reviewed and are  negative.      Allergies  Review of patient's allergies indicates no known allergies.  Home Medications   Current Outpatient Rx  Name  Route  Sig  Dispense  Refill  . acetaminophen (TYLENOL) 500 MG tablet   Oral   Take 500 mg by mouth every 6 (six) hours as needed for headache.         . cephALEXin (KEFLEX) 500 MG capsule   Oral   Take 1 capsule (500 mg total) by mouth 4 (four) times daily.   28 capsule   0   . metroNIDAZOLE (FLAGYL) 500 MG tablet   Oral   Take 1 tablet (500 mg total) by mouth 2 (two) times daily.   14 tablet   0   . Prenatal Vit-Fe Fumarate-FA (PRENATAL MULTIVITAMIN) TABS tablet   Oral   Take 1 tablet by mouth daily at 12 noon.          BP 98/59  Pulse 97  Temp(Src) 97.9 F (36.6 C) (Oral)  Resp 16  Ht 5\' 3"  (1.6 m)  Wt 110 lb (49.896 kg)  BMI 19.49 kg/m2  SpO2 97%  LMP 03/01/2013 Physical Exam  Constitutional: She is oriented to person, place, and time. She appears well-developed and well-nourished. No distress.  HENT:  Head: Normocephalic and atraumatic.  Right Ear: Hearing normal.  Left Ear: Hearing normal.  Nose: Nose normal.  Mouth/Throat: Oropharynx is clear and moist and mucous membranes are normal.  Eyes: Conjunctivae and EOM are normal. Pupils are equal, round, and reactive to light.  Neck: Normal range of motion. Neck supple.  Cardiovascular: Regular rhythm, S1 normal and S2 normal.  Exam reveals no gallop and no friction rub.   No murmur heard. Pulmonary/Chest: Effort normal and breath sounds normal. No respiratory distress. She exhibits no tenderness.  Abdominal: Soft. Normal appearance and bowel sounds are normal. There is no hepatosplenomegaly. There is no tenderness. There is no rebound, no guarding, no tenderness at McBurney's point and negative Murphy's sign. No hernia.  Genitourinary: Cervix exhibits motion tenderness and discharge. Right adnexum displays no mass. Left adnexum displays no mass.  Musculoskeletal: Normal  range of motion.  Neurological: She is alert and oriented to person, place, and time. She has normal strength. No cranial nerve deficit or sensory deficit. Coordination normal. GCS eye subscore is 4. GCS verbal subscore is 5. GCS motor subscore is 6.  Skin: Skin is warm, dry and intact. No rash noted. No cyanosis.  Psychiatric: She has a normal mood and affect. Her speech is normal and behavior is normal. Thought content normal.    ED Course  Procedures (including critical care time) DIAGNOSTIC STUDIES: Oxygen Saturation is 97% on room air, normal by my interpretation.    COORDINATION OF CARE:  4:17 PM Discussed course of care with pt which includes laboratory tests and pelvic exam. Pt understands and agrees.   Labs Review Labs Reviewed - No data to display Imaging Review No results found.   EKG Interpretation None      MDM   Final diagnoses:  None    Patient presents to the ER for evaluation of pelvic pain. Patient reports pain in the right lower pelvic region has been present for several weeks. Patient is pregnant, has not had any routine prenatal care. Reviewing her records, however, does reveal that she has been at the [facility] in [place] twice with this pregnancy. She has had IUP confirmed by ultrasound. She is 9 weeks on January 6, which would put her in the 17-18 week range today (nonviable).  Pelvic examination reveals copious white discharge with cervical motion tenderness. She also had tenderness in the area of the right adnexa without any palpable masses. Pain has been ongoing for 2 weeks, I do not have any suspicion for appendicitis.  She was also seen recently for this problem at another ER. She was started on Macrobid for this. Her urinalysis today does not show any significant signs of infection.   Patient will be treated with Rocephin and Zithromax here in the ER empirically. GC and Chlamydia cultures pending. Patient referred back to [facility] in [place] for further care.  I personally performed the services described in this documentation, which was scribed in my presence. The recorded information has been reviewed and is accurate.      Orpah Greek, MD 07/23/13 2725  Orpah Greek, MD 07/23/13 [place], MD 08/02/13 [phone]

## 2013-07-23 NOTE — Discharge Instructions (Signed)
Cervicitis Cervicitis is a soreness and swelling (inflammation) of the cervix. Your cervix is located at the bottom of your uterus. It opens up to the vagina. CAUSES   Sexually transmitted infections (STIs).   Allergic reaction.   Medicines or birth control devices that are put in the vagina.   Injury to the cervix.   Bacterial infections.  RISK FACTORS You are at greater risk if you:  Have unprotected sexual intercourse.  Have sexual intercourse with many partners.  Began sexual intercourse at an early age.  Have a history of STIs. SYMPTOMS  There may be no symptoms. If symptoms occur, they may include:   Grey, white, yellow, or bad-smelling vaginal discharge.   Pain or itching of the area outside the vagina.   Painful sexual intercourse.   Lower abdominal or lower back pain, especially during intercourse.   Frequent urination.   Abnormal vaginal bleeding between periods, after sexual intercourse, or after menopause.   Pressure or a heavy feeling in the pelvis.  DIAGNOSIS  Diagnosis is made after a pelvic exam. Other tests may include:   Examination of any discharge under a microscope (wet prep).   A Pap test.  TREATMENT  Treatment will depend on the cause of cervicitis. If it is caused by an STI, both you and your partner will need to be treated. Antibiotic medicines will be given.  HOME CARE INSTRUCTIONS   Do not have sexual intercourse until your health care provider says it is okay.   Do not have sexual intercourse until your partner has been treated, if your cervicitis is caused by an STI.   Take your antibiotics as directed. Finish them even if you start to feel better.  SEEK MEDICAL CARE IF:  Your symptoms come back.   You have a fever.  MAKE SURE YOU:   Understand these instructions.  Will watch your condition.  Will get help right away if you are not doing well or get worse. Document Released: 05/03/2005 Document Revised:  01/03/2013 Document Reviewed: 10/25/2012 [facility] Patient Information 2014 [place].  Pregnancy If you are planning on getting pregnant, it is a good idea to make a preconception appointment with your caregiver to discuss having a healthy lifestyle before getting pregnant. This includes diet, weight, exercise, taking prenatal vitamins (especially folic acid, which helps prevent brain and spinal cord defects), avoiding alcohol, smoking and illegal drugs, medical problems (diabetes, convulsions), family history of genetic problems, working conditions, and immunizations. It is better to have knowledge of these things and do something about them before getting pregnant. During your pregnancy, it is important to follow certain guidelines in order to have a healthy baby. It is very important to get good prenatal care and follow your caregiver's instructions. Prenatal care includes all the medical care you receive before your baby's birth. This helps to prevent problems during the pregnancy and childbirth. HOME CARE INSTRUCTIONS   Start your prenatal visits by the 12th week of pregnancy or earlier, if possible. At first, appointments are usually scheduled monthly. They become more frequent in the last 2 months before delivery. It is important that you keep your caregiver's appointments and follow your caregiver's instructions regarding medication use, exercise, and diet.  During pregnancy, you are providing food for you and your baby. Eat a regular, well-balanced diet. Choose foods such as meat, fish, milk and other dairy products, vegetables, fruits, whole-grain breads and cereals. Your caregiver will inform you of the ideal weight gain depending on your current height  and weight. Drink lots of liquids. Try to drink 8 glasses of water a day.  Alcohol is associated with a number of birth defects including fetal alcohol syndrome. It is best to avoid alcohol completely. Smoking will cause low birth rate  and prematurity. Use of alcohol and nicotine during your pregnancy also increases the chances that your child will be chemically dependent later in their life and may contribute to SIDS (Sudden Infant Death Syndrome).  Do not use illegal drugs.  Only take prescription or over-the-counter medications that are recommended by your caregiver. Other medications can cause genetic and physical problems in the baby.  Morning sickness can often be helped by keeping soda crackers at the bedside. Eat a few before getting up in the morning.  A sexual relationship may be continued until near the end of pregnancy if there are no other problems such as early (premature) leaking of amniotic fluid from the membranes, vaginal bleeding, painful intercourse or belly (abdominal) pain.  Exercise regularly. Check with your caregiver if you are unsure of the safety of some of your exercises.  Do not use hot tubs, steam rooms or saunas. These increase the risk of fainting and hurting yourself and the baby. Swimming is OK for exercise. Get plenty of rest, including afternoon naps when possible, especially in the third trimester.  Avoid toxic odors and chemicals.  Do not wear high heels. They may cause you to lose your balance and fall.  Do not lift over 5 pounds. If you do lift anything, lift with your legs and thighs, not your back.  Avoid long trips, especially in the third trimester.  If you have to travel out of the city or state, take a copy of your medical records with you. SEEK IMMEDIATE MEDICAL CARE IF:   You develop an unexplained oral temperature above 102 F (38.9 C), or as your caregiver suggests.  You have leaking of fluid from the vagina. If leaking membranes are suspected, take your temperature and inform your caregiver of this when you call.  There is vaginal spotting or bleeding. Notify your caregiver of the amount and how many pads are used.  You continue to feel sick to your stomach  (nauseous) and have no relief from remedies suggested, or you throw up (vomit) blood or coffee ground like materials.  You develop upper abdominal pain.  You have round ligament discomfort in the lower abdominal area. This still must be evaluated by your caregiver.  You feel contractions of the uterus.  You do not feel the baby move, or there is less movement than before.  You have painful urination.  You have abnormal vaginal discharge.  You have persistent diarrhea.  You get a severe headache.  You have problems with your vision.  You develop muscle weakness.  You feel dizzy and faint.  You develop shortness of breath.  You develop chest pain.  You have back pain that travels down to your leg and feet.  You feel irregular or a very fast heartbeat.  You develop excessive weight gain in a short period of time (5 pounds in 3 to 5 days).  You are involved in a domestic violence situation. Document Released: 05/03/2005 Document Revised: 11/02/2011 Document Reviewed: 10/25/2008 [facility] Patient Information 2014 [place].

## 2013-07-23 NOTE — ED Notes (Signed)
Pt states RLQ pain ("where ovary is" per pt)  and lower back pain x 3 mo. Recently treated at [facility] for UTI, stated she has completed her medication. Pt is 5 months pregnant. Has not seen OB/ GYN.

## 2013-07-24 LAB — GC/CHLAMYDIA PROBE AMP
CT Probe RNA: NEGATIVE
GC Probe RNA: NEGATIVE

## 2013-08-14 ENCOUNTER — Emergency Department (HOSPITAL_COMMUNITY): Admission: EM | Admit: 2013-08-14 | Discharge: 2013-08-14 | Discharge status: Against Medical Advice | Payer: Self-pay

## 2013-08-14 ENCOUNTER — Inpatient Hospital Stay (HOSPITAL_COMMUNITY): Payer: Medicaid Other

## 2013-08-14 ENCOUNTER — Inpatient Hospital Stay (HOSPITAL_COMMUNITY)
Admission: AD | Admit: 2013-08-14 | Discharge: 2013-08-14 | Disposition: A | Discharge status: Home / Self Care | Payer: Self-pay | Source: Ambulatory Visit | Attending: Obstetrics & Gynecology | Admitting: Obstetrics & Gynecology

## 2013-08-14 ENCOUNTER — Encounter (HOSPITAL_COMMUNITY): Payer: Self-pay | Admitting: *Deleted

## 2013-08-14 DIAGNOSIS — O209 Hemorrhage in early pregnancy, unspecified: Secondary | ICD-10-CM | POA: Insufficient documentation

## 2013-08-14 DIAGNOSIS — R109 Unspecified abdominal pain: Secondary | ICD-10-CM | POA: Insufficient documentation

## 2013-08-14 DIAGNOSIS — O4690 Antepartum hemorrhage, unspecified, unspecified trimester: Secondary | ICD-10-CM

## 2013-08-14 DIAGNOSIS — D649 Anemia, unspecified: Secondary | ICD-10-CM | POA: Insufficient documentation

## 2013-08-14 DIAGNOSIS — Z87891 Personal history of nicotine dependence: Secondary | ICD-10-CM | POA: Insufficient documentation

## 2013-08-14 DIAGNOSIS — M545 Low back pain, unspecified: Secondary | ICD-10-CM | POA: Insufficient documentation

## 2013-08-14 LAB — WET PREP, GENITAL
CLUE CELLS WET PREP: NONE SEEN
TRICH WET PREP: NONE SEEN
YEAST WET PREP: NONE SEEN

## 2013-08-14 LAB — URINALYSIS, ROUTINE W REFLEX MICROSCOPIC
Bilirubin Urine: NEGATIVE
GLUCOSE, UA: NEGATIVE mg/dL
KETONES UR: 40 mg/dL — AB
LEUKOCYTES UA: NEGATIVE
Nitrite: NEGATIVE
Protein, ur: NEGATIVE mg/dL
Specific Gravity, Urine: 1.015 (ref 1.005–1.030)
Urobilinogen, UA: 0.2 mg/dL (ref 0.0–1.0)
pH: 6.5 (ref 5.0–8.0)

## 2013-08-14 LAB — URINE MICROSCOPIC-ADD ON

## 2013-08-14 NOTE — Discharge Instructions (Signed)
Vaginal Bleeding During Pregnancy, Second Trimester °A small amount of bleeding (spotting) from the vagina is relatively common in pregnancy. It usually stops on its own. Various things can cause bleeding or spotting in pregnancy. Some bleeding may be related to the pregnancy, and some may not. Sometimes the bleeding is normal and is not a problem. However, bleeding can also be a sign of something serious. Be sure to tell your health care provider about any vaginal bleeding right away. °Some possible causes of vaginal bleeding during the second trimester include: °· Infection, inflammation, or growths on the cervix.   °· The placenta may be partially or completely covering the opening of the cervix inside the uterus (placenta previa). °· The placenta may have separated from the uterus (abruption of the placenta).   °· You may be having early (preterm) labor.   °· The cervix may not be strong enough to keep a baby inside the uterus (cervical insufficiency).   °· Tiny cysts may have developed in the uterus instead of pregnancy tissue (molar pregnancy).  °HOME CARE INSTRUCTIONS  °Watch your condition for any changes. The following actions may help to lessen any discomfort you are feeling: °· Follow your health care provider's instructions for limiting your activity. If your health care provider orders bed rest, you may need to stay in bed and only get up to use the bathroom. However, your health care provider may allow you to continue light activity. °· If needed, make plans for someone to help with your regular activities and responsibilities while you are on bed rest. °· Keep track of the number of pads you use each day, how often you change pads, and how soaked (saturated) they are. Write this down. °· Do not use tampons. Do not douche. °· Do not have sexual intercourse or orgasms until approved by your health care provider. °· If you pass any tissue from your vagina, save the tissue so you can show it to your  health care provider. °· Only take over-the-counter or prescription medicines as directed by your health care provider. °· Do not take aspirin because it can make you bleed. °· Do not exercise or perform any strenuous activities or heavy lifting without your health care provider's permission. °· Keep all follow-up appointments as directed by your health care provider. °SEEK MEDICAL CARE IF: °· You have any vaginal bleeding during any part of your pregnancy. °· You have cramps or labor pains. °SEEK IMMEDIATE MEDICAL CARE IF:  °· You have severe cramps in your back or belly (abdomen). °· You have contractions. °· You have a fever, not controlled by medicine. °· You have chills. °· You pass large clots or tissue from your vagina. °· Your bleeding increases. °· You feel lightheaded or weak, or you have fainting episodes. °· You are leaking fluid or have a gush of fluid from your vagina. °MAKE SURE YOU: °· Understand these instructions. °· Will watch your condition. °· Will get help right away if you are not doing well or get worse. °Document Released: 02/10/2005 Document Revised: 02/21/2013 Document Reviewed: 01/08/2013 °[employer]® Patient Information ©2014 [employer]. ° °

## 2013-08-14 NOTE — MAU Provider Note (Signed)
History     CSN: [id number]  Arrival date and time: 08/14/13 2440   First Provider Initiated Contact with Patient 08/14/13 2033      Chief Complaint  Patient presents with  . Vaginal Bleeding  . Abdominal Pain   HPI Pt is [redacted]w[redacted]d pregnant and presents with post coital bleeding.  She had intercourse about 4:30pm and had cramping And has continued.  Pt has appt on April 14 @ 9am for [facility].  Pt also states she has had a white vaginal discharge Without odor.  Pt has hx of yeast and UTI sx.  Pt denies pain with urination now.  Pt denies nausea or vomiting, constipation or diarrhea.   Pt has difficulty breathing feels like pressure when breathing.  Pt has nosebleeds. Pt also c/o of low back pain.  Past Medical History  Diagnosis Date  . Anemia   . Urinary tract infection     gets them frequently  . Pregnant     Past Surgical History  Procedure Laterality Date  . Dilation and evacuation  11/28/2010    Family History  Problem Relation Age of Onset  . Hypertension Father     History  Substance Use Topics  . Smoking status: Former Research scientist (life sciences)  . Smokeless tobacco: Not on file  . Alcohol Use: Yes     Comment: Last use of marijuana was 1 mo. ago per pt. 07/23/13    Allergies:  Allergies  Allergen Reactions  . Metronidazole Shortness Of Breath    Prescriptions prior to admission  Medication Sig Dispense Refill  . acetaminophen (TYLENOL) 500 MG tablet Take 1,000 mg by mouth every 6 (six) hours as needed for headache.       . Prenatal Vit-Fe Fumarate-FA (PRENATAL MULTIVITAMIN) TABS tablet Take 1 tablet by mouth at bedtime.         Review of Systems  Constitutional: Negative for fever and chills.  Gastrointestinal: Positive for abdominal pain. Negative for nausea, vomiting, diarrhea and constipation.  Genitourinary: Negative for dysuria and urgency.   Physical Exam   Blood pressure 108/63, pulse 112, temperature 98.1 F (36.7 C), temperature source Oral, resp. rate 18,  height 5\' 2"  (1.575 m), weight 50.349 kg (111 lb), last menstrual period 03/01/2013, SpO2 100.00%.  Physical Exam  Nursing note and vitals reviewed. Constitutional: She is oriented to person, place, and time. She appears well-developed and well-nourished. No distress.  HENT:  Head: Normocephalic.  Eyes: Pupils are equal, round, and reactive to light.  Neck: Normal range of motion. Neck supple.  Cardiovascular: Normal rate, regular rhythm and normal heart sounds.   Respiratory: Effort normal and breath sounds normal. No respiratory distress. She has no wheezes. She has no rales.  SpO2 100%  GI: Soft.  Genitourinary:  Small amount of dark brown blood in vault; cervix parous- endocervical polyp noted (not bleeding or friable); cervix closed NT; uterus gravid FHR 154 bpm with doppelr  Musculoskeletal: Normal range of motion.  Neurological: She is alert and oriented to person, place, and time.  Skin: Skin is warm and dry.  Psychiatric: She has a normal mood and affect.    MAU Course  Procedures Results for orders placed during the hospital encounter of 08/14/13 (from the past 24 hour(s))  URINALYSIS, ROUTINE W REFLEX MICROSCOPIC     Status: Abnormal   Collection Time    08/14/13  8:08 PM      Result Value Ref Range   Color, Urine YELLOW  YELLOW   APPearance CLEAR  CLEAR   Specific Gravity, Urine 1.015  1.005 - 1.030   pH 6.5  5.0 - 8.0   Glucose, UA NEGATIVE  NEGATIVE mg/dL   Hgb urine dipstick LARGE (*) NEGATIVE   Bilirubin Urine NEGATIVE  NEGATIVE   Ketones, ur 40 (*) NEGATIVE mg/dL   Protein, ur NEGATIVE  NEGATIVE mg/dL   Urobilinogen, UA 0.2  0.0 - 1.0 mg/dL   Nitrite NEGATIVE  NEGATIVE   Leukocytes, UA NEGATIVE  NEGATIVE  URINE MICROSCOPIC-ADD ON     Status: Abnormal   Collection Time    08/14/13  8:08 PM      Result Value Ref Range   Squamous Epithelial / LPF FEW (*) RARE   WBC, UA 0-2  <3 WBC/hpf   RBC / HPF 3-6  <3 RBC/hpf   Bacteria, UA FEW (*) RARE   Urine-Other  MUCOUS PRESENT    WET PREP, GENITAL     Status: Abnormal   Collection Time    08/14/13  8:55 PM      Result Value Ref Range   Yeast Wet Prep HPF POC NONE SEEN  NONE SEEN   Trich, Wet Prep NONE SEEN  NONE SEEN   Clue Cells Wet Prep HPF POC NONE SEEN  NONE SEEN   WBC, Wet Prep HPF POC FEW (*) NONE SEEN   Preliminary US showed placenta placenta posterior above cervicc, FHR 150 BPD [redacted]w[redacted]d with cervical length 3.27cm   Assessment and Plan  Bleeding in 2nd trimester Radiology will call you for an appointment for your anatomy ultrasound Keep your OB appointment in the clinic Return for any increase in bleeding or pain  Brand Siever 08/14/2013, 8:36 PM

## 2013-08-14 NOTE — MAU Note (Signed)
Pt reports she has been bleeding since 4 pm today. Lower abd pain, lower back pain. Pt is not wearing a pad

## 2013-08-15 LAB — GC/CHLAMYDIA PROBE AMP
CT Probe RNA: NEGATIVE
GC Probe RNA: NEGATIVE

## 2013-08-28 ENCOUNTER — Encounter: Payer: Self-pay | Admitting: Obstetrics & Gynecology

## 2013-08-28 ENCOUNTER — Ambulatory Visit (INDEPENDENT_AMBULATORY_CARE_PROVIDER_SITE_OTHER): Payer: Self-pay | Admitting: Obstetrics & Gynecology

## 2013-08-28 VITALS — BP 100/70 | Temp 97.4°F | Wt 113.6 lb

## 2013-08-28 DIAGNOSIS — Z6791 Unspecified blood type, Rh negative: Secondary | ICD-10-CM | POA: Insufficient documentation

## 2013-08-28 DIAGNOSIS — O0930 Supervision of pregnancy with insufficient antenatal care, unspecified trimester: Secondary | ICD-10-CM

## 2013-08-28 DIAGNOSIS — O26899 Other specified pregnancy related conditions, unspecified trimester: Secondary | ICD-10-CM

## 2013-08-28 DIAGNOSIS — O4690 Antepartum hemorrhage, unspecified, unspecified trimester: Secondary | ICD-10-CM

## 2013-08-28 DIAGNOSIS — O360990 Maternal care for other rhesus isoimmunization, unspecified trimester, not applicable or unspecified: Secondary | ICD-10-CM

## 2013-08-28 LAB — POCT URINALYSIS DIP (DEVICE)
Bilirubin Urine: NEGATIVE
Glucose, UA: NEGATIVE mg/dL
Hgb urine dipstick: NEGATIVE
KETONES UR: NEGATIVE mg/dL
NITRITE: NEGATIVE
Protein, ur: NEGATIVE mg/dL
Specific Gravity, Urine: >=1.03 (ref 1.005–1.030)
UROBILINOGEN UA: 0.2 mg/dL (ref 0.0–1.0)
pH: 6.5 (ref 5.0–8.0)

## 2013-08-28 NOTE — MAU Provider Note (Signed)
Attestation of Attending Supervision of Advanced Practitioner (CNM/NP): Evaluation and management procedures were performed by the Advanced Practitioner under my supervision and collaboration. I have reviewed the Advanced Practitioner's note and chart, and I agree with the management and plan.  Guss Bunde 6:22 PM

## 2013-08-28 NOTE — Progress Notes (Signed)
   Subjective:    Patricia Richards is being seen today for her first obstetrical visit.  This is a planned pregnancy. She is 23 y.o. G3P1011 at [redacted]w[redacted]d gestation. Her obstetrical history is significant for term SVD. Relationship with FOB: significant other, living together. Patient does intend to breast feed. Pregnancy history fully reviewed.  Patient complains of occasional post-coital bleeding, had negative GC/Chlam and wet prep cultures when she was evaluated in the MAU on 08/14/13.  Menstrual History: OB History   Grav Para Term Preterm Abortions TAB SAB Ect Mult Living   3 1 1  0 1 1 0 0 0 1    SVD x 1, induced for PUPPS?.Female, 8 lbs 7 oz on 05/28/10 TAB X 1 Patient's last menstrual period was 03/01/2013.    The following portions of the patient's history were reviewed and updated as appropriate: allergies, current medications, past family history, past medical history, past social history, past surgical history and problem list.  Review of Systems Pertinent items are noted in HPI.    Objective:   BP 100/70  Temp(Src) 97.4 F (36.3 C)  Wt 113 lb 9.6 oz (51.529 kg)  LMP 03/01/2013 GENERAL: Well-developed, well-nourished female in no acute distress.  HEENT: Normocephalic, atraumatic. Sclerae anicteric.  NECK: Supple. Normal thyroid.  LUNGS: Clear to auscultation bilaterally.  HEART: Regular rate and rhythm. BREASTS:Deferred ABDOMEN: Soft, nontender, nondistended. No organomegaly. PELVIC: Deferred  EXTREMITIES: No cyanosis, clubbing, or edema, 2+ distal pulses.   Assessment:   Pregnancy at [redacted]w[redacted]d, late prenatal care   Plan:   Initial labs drawn. Prenatal vitamins Problem list reviewed and updated. Role of ultrasound in pregnancy discussed; fetal survey: ordered. Follow up in 4 weeks; will get 1 hr GTT, labs, TDap, Rhogam Fetal movement and labor precautions reviewed.   Verita Schneiders, MD, [place] Attending [place], [place]

## 2013-08-28 NOTE — Patient Instructions (Signed)
Second Trimester of Pregnancy The second trimester is from week 13 through week 28, months 4 through 6. The second trimester is often a time when you feel your best. Your body has also adjusted to being pregnant, and you begin to feel better physically. Usually, morning sickness has lessened or quit completely, you may have more energy, and you may have an increase in appetite. The second trimester is also a time when the fetus is growing rapidly. At the end of the sixth month, the fetus is about 9 inches long and weighs about 1 pounds. You will likely begin to feel the baby move (quickening) between 18 and 20 weeks of the pregnancy. BODY CHANGES Your body goes through many changes during pregnancy. The changes vary from woman to woman.   Your weight will continue to increase. You will notice your lower abdomen bulging out.  You may begin to get stretch marks on your hips, abdomen, and breasts.  You may develop headaches that can be relieved by medicines approved by your caregiver.  You may urinate more often because the fetus is pressing on your bladder.  You may develop or continue to have heartburn as a result of your pregnancy.  You may develop constipation because certain hormones are causing the muscles that push waste through your intestines to slow down.  You may develop hemorrhoids or swollen, bulging veins (varicose veins).  You may have back pain because of the weight gain and pregnancy hormones relaxing your joints between the bones in your pelvis and as a result of a shift in weight and the muscles that support your balance.  Your breasts will continue to grow and be tender.  Your gums may bleed and may be sensitive to brushing and flossing.  Dark spots or blotches (chloasma, mask of pregnancy) may develop on your face. This will likely fade after the baby is born.  A dark line from your belly button to the pubic area (linea nigra) may appear. This will likely fade after the  baby is born. WHAT TO EXPECT AT YOUR PRENATAL VISITS During a routine prenatal visit:  You will be weighed to make sure you and the fetus are growing normally.  Your blood pressure will be taken.  Your abdomen will be measured to track your baby's growth.  The fetal heartbeat will be listened to.  Any test results from the previous visit will be discussed. Your caregiver may ask you:  How you are feeling.  If you are feeling the baby move.  If you have had any abnormal symptoms, such as leaking fluid, bleeding, severe headaches, or abdominal cramping.  If you have any questions. Other tests that may be performed during your second trimester include:  Blood tests that check for:  Low iron levels (anemia).  Gestational diabetes (between 24 and 28 weeks).  Rh antibodies.  Urine tests to check for infections, diabetes, or protein in the urine.  An ultrasound to confirm the proper growth and development of the baby.  An amniocentesis to check for possible genetic problems.  Fetal screens for spina bifida and Down syndrome. HOME CARE INSTRUCTIONS   Avoid all smoking, herbs, alcohol, and unprescribed drugs. These chemicals affect the formation and growth of the baby.  Follow your caregiver's instructions regarding medicine use. There are medicines that are either safe or unsafe to take during pregnancy.  Exercise only as directed by your caregiver. Experiencing uterine cramps is a good sign to stop exercising.  Continue to eat regular,   healthy meals.  Wear a good support bra for breast tenderness.  Do not use hot tubs, steam rooms, or saunas.  Wear your seat belt at all times when driving.  Avoid raw meat, uncooked cheese, cat litter boxes, and soil used by cats. These carry germs that can cause birth defects in the baby.  Take your prenatal vitamins.  Try taking a stool softener (if your caregiver approves) if you develop constipation. Eat more high-fiber foods,  such as fresh vegetables or fruit and whole grains. Drink plenty of fluids to keep your urine clear or pale yellow.  Take warm sitz baths to soothe any pain or discomfort caused by hemorrhoids. Use hemorrhoid cream if your caregiver approves.  If you develop varicose veins, wear support hose. Elevate your feet for 15 minutes, 3 4 times a day. Limit salt in your diet.  Avoid heavy lifting, wear low heel shoes, and practice good posture.  Rest with your legs elevated if you have leg cramps or low back pain.  Visit your dentist if you have not gone yet during your pregnancy. Use a soft toothbrush to brush your teeth and be gentle when you floss.  A sexual relationship may be continued unless your caregiver directs you otherwise.  Continue to go to all your prenatal visits as directed by your caregiver. SEEK MEDICAL CARE IF:   You have dizziness.  You have mild pelvic cramps, pelvic pressure, or nagging pain in the abdominal area.  You have persistent nausea, vomiting, or diarrhea.  You have a bad smelling vaginal discharge.  You have pain with urination. SEEK IMMEDIATE MEDICAL CARE IF:   You have a fever.  You are leaking fluid from your vagina.  You have spotting or bleeding from your vagina.  You have severe abdominal cramping or pain.  You have rapid weight gain or loss.  You have shortness of breath with chest pain.  You notice sudden or extreme swelling of your face, hands, ankles, feet, or legs.  You have not felt your baby move in over an hour.  You have severe headaches that do not go away with medicine.  You have vision changes. Document Released: 04/27/2001 Document Revised: 01/03/2013 Document Reviewed: 07/04/2012 [facility] Patient Information 2014 [place].  Third Trimester of Pregnancy The third trimester is from week 29 through week 42, months 7 through 9. The third trimester is a time when the fetus is growing rapidly. At the end of the ninth  month, the fetus is about 20 inches in length and weighs 6 10 pounds.  BODY CHANGES Your body goes through many changes during pregnancy. The changes vary from woman to woman.   Your weight will continue to increase. You can expect to gain 25 35 pounds (11 16 kg) by the end of the pregnancy.  You may begin to get stretch marks on your hips, abdomen, and breasts.  You may urinate more often because the fetus is moving lower into your pelvis and pressing on your bladder.  You may develop or continue to have heartburn as a result of your pregnancy.  You may develop constipation because certain hormones are causing the muscles that push waste through your intestines to slow down.  You may develop hemorrhoids or swollen, bulging veins (varicose veins).  You may have pelvic pain because of the weight gain and pregnancy hormones relaxing your joints between the bones in your pelvis. Back aches may result from over exertion of the muscles supporting your posture.  Your  breasts will continue to grow and be tender. A yellow discharge may leak from your breasts called colostrum.  Your belly button may stick out.  You may feel short of breath because of your expanding uterus.  You may notice the fetus "dropping," or moving lower in your abdomen.  You may have a bloody mucus discharge. This usually occurs a few days to a week before labor begins.  Your cervix becomes thin and soft (effaced) near your due date. WHAT TO EXPECT AT YOUR PRENATAL EXAMS  You will have prenatal exams every 2 weeks until week 36. Then, you will have weekly prenatal exams. During a routine prenatal visit:  You will be weighed to make sure you and the fetus are growing normally.  Your blood pressure is taken.  Your abdomen will be measured to track your baby's growth.  The fetal heartbeat will be listened to.  Any test results from the previous visit will be discussed.  You may have a cervical check near your due  date to see if you have effaced. At around 36 weeks, your caregiver will check your cervix. At the same time, your caregiver will also perform a test on the secretions of the vaginal tissue. This test is to determine if a type of bacteria, Group B streptococcus, is present. Your caregiver will explain this further. Your caregiver may ask you:  What your birth plan is.  How you are feeling.  If you are feeling the baby move.  If you have had any abnormal symptoms, such as leaking fluid, bleeding, severe headaches, or abdominal cramping.  If you have any questions. Other tests or screenings that may be performed during your third trimester include:  Blood tests that check for low iron levels (anemia).  Fetal testing to check the health, activity level, and growth of the fetus. Testing is done if you have certain medical conditions or if there are problems during the pregnancy. FALSE LABOR You may feel small, irregular contractions that eventually go away. These are called Braxton Hicks contractions, or false labor. Contractions may last for hours, days, or even weeks before true labor sets in. If contractions come at regular intervals, intensify, or become painful, it is best to be seen by your caregiver.  SIGNS OF LABOR   Menstrual-like cramps.  Contractions that are 5 minutes apart or less.  Contractions that start on the top of the uterus and spread down to the lower abdomen and back.  A sense of increased pelvic pressure or back pain.  A watery or bloody mucus discharge that comes from the vagina. If you have any of these signs before the 37th week of pregnancy, call your caregiver right away. You need to go to the hospital to get checked immediately. HOME CARE INSTRUCTIONS   Avoid all smoking, herbs, alcohol, and unprescribed drugs. These chemicals affect the formation and growth of the baby.  Follow your caregiver's instructions regarding medicine use. There are medicines that  are either safe or unsafe to take during pregnancy.  Exercise only as directed by your caregiver. Experiencing uterine cramps is a good sign to stop exercising.  Continue to eat regular, healthy meals.  Wear a good support bra for breast tenderness.  Do not use hot tubs, steam rooms, or saunas.  Wear your seat belt at all times when driving.  Avoid raw meat, uncooked cheese, cat litter boxes, and soil used by cats. These carry germs that can cause birth defects in the baby.  Take  your prenatal vitamins.  Try taking a stool softener (if your caregiver approves) if you develop constipation. Eat more high-fiber foods, such as fresh vegetables or fruit and whole grains. Drink plenty of fluids to keep your urine clear or pale yellow.  Take warm sitz baths to soothe any pain or discomfort caused by hemorrhoids. Use hemorrhoid cream if your caregiver approves.  If you develop varicose veins, wear support hose. Elevate your feet for 15 minutes, 3 4 times a day. Limit salt in your diet.  Avoid heavy lifting, wear low heal shoes, and practice good posture.  Rest a lot with your legs elevated if you have leg cramps or low back pain.  Visit your dentist if you have not gone during your pregnancy. Use a soft toothbrush to brush your teeth and be gentle when you floss.  A sexual relationship may be continued unless your caregiver directs you otherwise.  Do not travel far distances unless it is absolutely necessary and only with the approval of your caregiver.  Take prenatal classes to understand, practice, and ask questions about the labor and delivery.  Make a trial run to the hospital.  Pack your hospital bag.  Prepare the baby's nursery.  Continue to go to all your prenatal visits as directed by your caregiver. SEEK MEDICAL CARE IF:  You are unsure if you are in labor or if your water has broken.  You have dizziness.  You have mild pelvic cramps, pelvic pressure, or nagging pain  in your abdominal area.  You have persistent nausea, vomiting, or diarrhea.  You have a bad smelling vaginal discharge.  You have pain with urination. SEEK IMMEDIATE MEDICAL CARE IF:   You have a fever.  You are leaking fluid from your vagina.  You have spotting or bleeding from your vagina.  You have severe abdominal cramping or pain.  You have rapid weight loss or gain.  You have shortness of breath with chest pain.  You notice sudden or extreme swelling of your face, hands, ankles, feet, or legs.  You have not felt your baby move in over an hour.  You have severe headaches that do not go away with medicine.  You have vision changes. Document Released: 04/27/2001 Document Revised: 01/03/2013 Document Reviewed: 07/04/2012 [facility] Patient Information 2014 [place].

## 2013-08-28 NOTE — Progress Notes (Signed)
Pulse- 90 Patient reports constant pelvic pain and lower back pain; also reports one day of really bad contractions the other day; also reports bleeding after intercourse every time, sometimes more than spotting

## 2013-08-29 ENCOUNTER — Encounter: Payer: Self-pay | Admitting: Obstetrics & Gynecology

## 2013-08-29 DIAGNOSIS — O9989 Other specified diseases and conditions complicating pregnancy, childbirth and the puerperium: Secondary | ICD-10-CM

## 2013-08-29 DIAGNOSIS — Z283 Underimmunization status: Secondary | ICD-10-CM | POA: Insufficient documentation

## 2013-08-29 DIAGNOSIS — O09899 Supervision of other high risk pregnancies, unspecified trimester: Secondary | ICD-10-CM | POA: Insufficient documentation

## 2013-08-29 DIAGNOSIS — Z2839 Other underimmunization status: Secondary | ICD-10-CM | POA: Insufficient documentation

## 2013-08-29 LAB — OBSTETRIC PANEL
ANTIBODY SCREEN: NEGATIVE
BASOS ABS: 0 10*3/uL (ref 0.0–0.1)
BASOS PCT: 0 % (ref 0–1)
Eosinophils Absolute: 0.1 10*3/uL (ref 0.0–0.7)
Eosinophils Relative: 1 % (ref 0–5)
HEMATOCRIT: 29.7 % — AB (ref 36.0–46.0)
HEMOGLOBIN: 10 g/dL — AB (ref 12.0–15.0)
HEP B S AG: NEGATIVE
LYMPHS PCT: 25 % (ref 12–46)
Lymphs Abs: 1.9 10*3/uL (ref 0.7–4.0)
MCH: 29.8 pg (ref 26.0–34.0)
MCHC: 33.7 g/dL (ref 30.0–36.0)
MCV: 88.4 fL (ref 78.0–100.0)
Monocytes Absolute: 0.5 10*3/uL (ref 0.1–1.0)
Monocytes Relative: 7 % (ref 3–12)
Neutro Abs: 5 10*3/uL (ref 1.7–7.7)
Neutrophils Relative %: 67 % (ref 43–77)
PLATELETS: 239 10*3/uL (ref 150–400)
RBC: 3.36 MIL/uL — ABNORMAL LOW (ref 3.87–5.11)
RDW: 13.6 % (ref 11.5–15.5)
Rh Type: NEGATIVE
Rubella: 0.6 Index (ref <0.90)
WBC: 7.5 10*3/uL (ref 4.0–10.5)

## 2013-08-29 LAB — HIV ANTIBODY (ROUTINE TESTING W REFLEX): HIV 1&2 Ab, 4th Generation: NONREACTIVE

## 2013-08-30 LAB — PRESCRIPTION MONITORING PROFILE (19 PANEL)
AMPHETAMINE/METH: NEGATIVE ng/mL
Barbiturate Screen, Urine: NEGATIVE ng/mL
Benzodiazepine Screen, Urine: NEGATIVE ng/mL
Buprenorphine, Urine: NEGATIVE ng/mL
CANNABINOID SCRN UR: NEGATIVE ng/mL
CREATININE, URINE: 131.06 mg/dL (ref >20.0)
Carisoprodol, Urine: NEGATIVE ng/mL
Cocaine Metabolites: NEGATIVE ng/mL
FENTANYL URINE: NEGATIVE ng/mL
MDMA URINE: NEGATIVE ng/mL
METHADONE SCREEN, URINE: NEGATIVE ng/mL
Meperidine, Ur: NEGATIVE ng/mL
Methaqualone: NEGATIVE ng/mL
NITRITES URINE, INITIAL: NEGATIVE ug/mL
OXYCODONE SCRN UR: NEGATIVE ng/mL
Opiate Screen, Urine: NEGATIVE ng/mL
Phencyclidine, Ur: NEGATIVE ng/mL
Propoxyphene: NEGATIVE ng/mL
TAPENTADOLUR: NEGATIVE ng/mL
Tramadol Scrn, Ur: NEGATIVE ng/mL
Zolpidem, Urine: NEGATIVE ng/mL
pH, Initial: 6.6 pH (ref 4.5–8.9)

## 2013-08-30 LAB — CULTURE, OB URINE
Colony Count: NO GROWTH
Organism ID, Bacteria: NO GROWTH

## 2013-09-04 ENCOUNTER — Ambulatory Visit (HOSPITAL_COMMUNITY)
Admission: RE | Admit: 2013-09-04 | Discharge: 2013-09-04 | Disposition: A | Discharge status: Home / Self Care | Payer: Medicaid Other | Source: Ambulatory Visit | Attending: Gynecology | Admitting: Gynecology

## 2013-09-04 DIAGNOSIS — O0930 Supervision of pregnancy with insufficient antenatal care, unspecified trimester: Secondary | ICD-10-CM | POA: Insufficient documentation

## 2013-09-04 DIAGNOSIS — O98519 Other viral diseases complicating pregnancy, unspecified trimester: Secondary | ICD-10-CM | POA: Insufficient documentation

## 2013-09-04 DIAGNOSIS — O360990 Maternal care for other rhesus isoimmunization, unspecified trimester, not applicable or unspecified: Secondary | ICD-10-CM | POA: Diagnosis present

## 2013-09-04 DIAGNOSIS — B069 Rubella without complication: Secondary | ICD-10-CM | POA: Diagnosis not present

## 2013-09-06 ENCOUNTER — Encounter: Payer: Self-pay | Admitting: Obstetrics & Gynecology

## 2013-09-25 ENCOUNTER — Encounter: Payer: Self-pay | Admitting: Obstetrics & Gynecology

## 2013-10-09 ENCOUNTER — Ambulatory Visit (INDEPENDENT_AMBULATORY_CARE_PROVIDER_SITE_OTHER): Payer: Self-pay | Admitting: Obstetrics & Gynecology

## 2013-10-09 ENCOUNTER — Encounter: Payer: Self-pay | Admitting: Obstetrics & Gynecology

## 2013-10-09 VITALS — BP 103/68 | HR 103 | Temp 97.0°F | Wt 118.0 lb

## 2013-10-09 DIAGNOSIS — O26899 Other specified pregnancy related conditions, unspecified trimester: Secondary | ICD-10-CM

## 2013-10-09 DIAGNOSIS — Z6791 Unspecified blood type, Rh negative: Secondary | ICD-10-CM

## 2013-10-09 DIAGNOSIS — O360990 Maternal care for other rhesus isoimmunization, unspecified trimester, not applicable or unspecified: Secondary | ICD-10-CM

## 2013-10-09 DIAGNOSIS — O0930 Supervision of pregnancy with insufficient antenatal care, unspecified trimester: Secondary | ICD-10-CM

## 2013-10-09 DIAGNOSIS — Z23 Encounter for immunization: Secondary | ICD-10-CM

## 2013-10-09 LAB — POCT URINALYSIS DIP (DEVICE)
Bilirubin Urine: NEGATIVE
Glucose, UA: NEGATIVE mg/dL
Hgb urine dipstick: NEGATIVE
Ketones, ur: NEGATIVE mg/dL
Nitrite: NEGATIVE
PH: 6.5 (ref 5.0–8.0)
PROTEIN: 30 mg/dL — AB
Specific Gravity, Urine: 1.025 (ref 1.005–1.030)
UROBILINOGEN UA: 0.2 mg/dL (ref 0.0–1.0)

## 2013-10-09 LAB — CBC
HCT: 31.4 % — ABNORMAL LOW (ref 36.0–46.0)
Hemoglobin: 10.6 g/dL — ABNORMAL LOW (ref 12.0–15.0)
MCH: 30.4 pg (ref 26.0–34.0)
MCHC: 33.8 g/dL (ref 30.0–36.0)
MCV: 90 fL (ref 78.0–100.0)
PLATELETS: 206 10*3/uL (ref 150–400)
RBC: 3.49 MIL/uL — ABNORMAL LOW (ref 3.87–5.11)
RDW: 13.1 % (ref 11.5–15.5)
WBC: 7 10*3/uL (ref 4.0–10.5)

## 2013-10-09 MED ORDER — TETANUS-DIPHTH-ACELL PERTUSSIS 5-2.5-18.5 LF-MCG/0.5 IM SUSP: 0.5000 mL | Freq: Once | INTRAMUSCULAR | Status: DC

## 2013-10-09 MED ORDER — RHO D IMMUNE GLOBULIN 1500 UNIT/2ML IJ SOSY
300.0000 ug | PREFILLED_SYRINGE | Freq: Once | INTRAMUSCULAR | Status: AC
  Administered 2013-10-09: 300 ug via INTRAMUSCULAR

## 2013-10-09 NOTE — Progress Notes (Signed)
28 week labs Tdap today Rh today F/u in 1 weeks

## 2013-10-09 NOTE — Patient Instructions (Signed)
Glucose Tolerance Test This is a test to see how your body processes carbohydrates. This test is often done to check patients for diabetes or the possibility of developing it. PREPARATION FOR TEST You should have nothing to eat or drink 12 hours before the test. You will be given a form of sugar (glucose) and then blood samples will be drawn from your vein to determine the level of sugar in your blood. Alternatively, blood may be drawn from your finger for testing. You should not smoke or exercise during the test. NORMAL FINDINGS  Fasting: 70-115 mg/dL  30 minutes: less than 200 mg/dL  1 hour: less than 200 mg/dL  2 hours: less than 140 mg/dL  3 hours: 70-115 mg/dL  4 hours: 70-115 mg/dL Ranges for normal findings may vary among different laboratories and hospitals. You should always check with your doctor after having lab work or other tests done to discuss the meaning of your test results and whether your values are considered within normal limits. MEANING OF TEST Your caregiver will go over the test results with you and discuss the importance and meaning of your results, as well as treatment options and the need for additional tests. OBTAINING THE TEST RESULTS It is your responsibility to obtain your test results. Ask the lab or department performing the test when and how you will get your results. Document Released: 05/26/2004 Document Revised: 07/26/2011 Document Reviewed: 04/13/2008 [employer] Patient Information 2014 [employer].  

## 2013-10-09 NOTE — Progress Notes (Signed)
C/o pain in calf both legs only at night when lie down. C/o feeling cramping or contractions every other day.

## 2013-10-10 LAB — RPR

## 2013-10-10 LAB — GLUCOSE TOLERANCE, 1 HOUR (50G) W/O FASTING: GLUCOSE 1 HOUR GTT: 120 mg/dL (ref 70–140)

## 2013-10-10 LAB — HIV ANTIBODY (ROUTINE TESTING W REFLEX): HIV: NONREACTIVE

## 2013-10-11 ENCOUNTER — Encounter: Payer: Self-pay | Admitting: Obstetrics & Gynecology

## 2013-10-26 ENCOUNTER — Ambulatory Visit (INDEPENDENT_AMBULATORY_CARE_PROVIDER_SITE_OTHER): Payer: Self-pay | Admitting: Advanced Practice Midwife

## 2013-10-26 VITALS — BP 103/66 | HR 95 | Wt 122.1 lb

## 2013-10-26 DIAGNOSIS — O9989 Other specified diseases and conditions complicating pregnancy, childbirth and the puerperium: Secondary | ICD-10-CM

## 2013-10-26 DIAGNOSIS — N898 Other specified noninflammatory disorders of vagina: Secondary | ICD-10-CM

## 2013-10-26 DIAGNOSIS — O479 False labor, unspecified: Secondary | ICD-10-CM

## 2013-10-26 DIAGNOSIS — O26893 Other specified pregnancy related conditions, third trimester: Secondary | ICD-10-CM

## 2013-10-26 DIAGNOSIS — O4700 False labor before 37 completed weeks of gestation, unspecified trimester: Secondary | ICD-10-CM

## 2013-10-26 DIAGNOSIS — O0930 Supervision of pregnancy with insufficient antenatal care, unspecified trimester: Secondary | ICD-10-CM

## 2013-10-26 LAB — POCT URINALYSIS DIP (DEVICE)
BILIRUBIN URINE: NEGATIVE
Glucose, UA: NEGATIVE mg/dL
Hgb urine dipstick: NEGATIVE
KETONES UR: NEGATIVE mg/dL
Nitrite: NEGATIVE
PROTEIN: NEGATIVE mg/dL
SPECIFIC GRAVITY, URINE: 1.02 (ref 1.005–1.030)
Urobilinogen, UA: 0.2 mg/dL (ref 0.0–1.0)
pH: 6.5 (ref 5.0–8.0)

## 2013-10-26 LAB — FETAL FIBRONECTIN: Fetal Fibronectin: NEGATIVE

## 2013-10-26 NOTE — Progress Notes (Signed)
Having 3-4 UCs per hour at times. No bleeding. FFn collected. Instructed pt on pelvic rest and will do Steroids if it comes back positive. Wet prep done due to vaginal discharge.

## 2013-10-26 NOTE — Addendum Note (Signed)
Addended by: Christiana Pellant A on: 10/26/2013 11:21 AM   Modules accepted: Orders

## 2013-10-26 NOTE — Progress Notes (Signed)
FFN negative.  Patient called at listed home number, father answered. Left message for patient to call the clinic on Monday; he was told to tell her "everything was fine" but did not discuss specifics of test or results.  Initially called her listed mobile number, but an angry woman picked up and said that [place] Le had not lived there for years and demanded the number be removed from the demographic information.  This number was removed.   Osborne Oman, MD

## 2013-10-26 NOTE — Patient Instructions (Signed)
Preterm Labor Information Preterm labor is when labor starts at less than 37 weeks of pregnancy. The normal length of a pregnancy is 39 to 41 weeks. CAUSES Often, there is no identifiable underlying cause as to why a woman goes into preterm labor. One of the most common known causes of preterm labor is infection. Infections of the uterus, cervix, vagina, amniotic sac, bladder, kidney, or even the lungs (pneumonia) can cause labor to start. Other suspected causes of preterm labor include:   Urogenital infections, such as yeast infections and bacterial vaginosis.   Uterine abnormalities (uterine shape, uterine septum, fibroids, or bleeding from the placenta).   A cervix that has been operated on (it may fail to stay closed).   Malformations in the fetus.   Multiple gestations (twins, triplets, and so on).   Breakage of the amniotic sac.  RISK FACTORS  Having a previous history of preterm labor.   Having premature rupture of membranes (PROM).   Having a placenta that covers the opening of the cervix (placenta previa).   Having a placenta that separates from the uterus (placental abruption).   Having a cervix that is too weak to hold the fetus in the uterus (incompetent cervix).   Having too much fluid in the amniotic sac (polyhydramnios).   Taking illegal drugs or smoking while pregnant.   Not gaining enough weight while pregnant.   Being younger than 56 and older than 21 years old.   Having a low socioeconomic status.   Being African American. SYMPTOMS Signs and symptoms of preterm labor include:   Menstrual-like cramps, abdominal pain, or back pain.  Uterine contractions that are regular, as frequent as six in an hour, regardless of their intensity (may be mild or painful).  Contractions that start on the top of the uterus and spread down to the lower abdomen and back.   A sense of increased pelvic pressure.   A watery or bloody mucus discharge that  comes from the vagina.  TREATMENT Depending on the length of the pregnancy and other circumstances, your health care provider may suggest bed rest. If necessary, there are medicines that can be given to stop contractions and to mature the fetal lungs. If labor happens before 34 weeks of pregnancy, a prolonged hospital stay may be recommended. Treatment depends on the condition of both you and the fetus.  WHAT SHOULD YOU DO IF YOU THINK YOU ARE IN PRETERM LABOR? Call your health care provider right away. You will need to go to the hospital to get checked immediately. HOW CAN YOU PREVENT PRETERM LABOR IN FUTURE PREGNANCIES? You should:   Stop smoking if you smoke.  Maintain healthy weight gain and avoid chemicals and drugs that are not necessary.  Be watchful for any type of infection.  Inform your health care provider if you have a known history of preterm labor. Document Released: 07/24/2003 Document Revised: 01/03/2013 Document Reviewed: 06/05/2012 [facility] Patient Information 2014 [place], [place].

## 2013-10-26 NOTE — Progress Notes (Signed)
Reports discharge with foul smell.  Reports pelvic pressure with on and off contractions.

## 2013-10-26 NOTE — Addendum Note (Signed)
Addended by: Hansel Feinstein L on: 10/26/2013 11:13 AM   Modules accepted: Orders

## 2013-10-27 LAB — WET PREP, GENITAL: Trich, Wet Prep: NONE SEEN

## 2013-10-29 ENCOUNTER — Telehealth: Payer: Self-pay

## 2013-10-29 DIAGNOSIS — B3731 Acute candidiasis of vulva and vagina: Secondary | ICD-10-CM

## 2013-10-29 DIAGNOSIS — B9689 Other specified bacterial agents as the cause of diseases classified elsewhere: Secondary | ICD-10-CM

## 2013-10-29 DIAGNOSIS — N760 Acute vaginitis: Principal | ICD-10-CM

## 2013-10-29 DIAGNOSIS — B373 Candidiasis of vulva and vagina: Secondary | ICD-10-CM

## 2013-10-29 MED ORDER — FLUCONAZOLE 150 MG PO TABS: 150.0000 mg | ORAL_TABLET | Freq: Once | ORAL | Status: DC

## 2013-10-29 MED ORDER — CLINDAMYCIN HCL 300 MG PO CAPS: 300.0000 mg | ORAL_CAPSULE | Freq: Two times a day (BID) | ORAL | Status: DC

## 2013-10-29 NOTE — Telephone Encounter (Signed)
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

## 2013-10-29 NOTE — Telephone Encounter (Signed)
Consulted Dr. Roselie Awkward here in clinic regarding high dose clindamycin for treatment of BV (suspect typo)-- recommended order Cleocin 300mg  BID X7 days. Also stated Diflucan OK to be ordered with patient's allergy to metronidazole. Medications e-prescribed. Attempted to call patient. No answer. Left message stating we are calling with results and of information of prescriptions sent to your pharmacy, please call clinic.

## 2013-10-29 NOTE — Telephone Encounter (Signed)
Thanks for catching that.   We can use clindamcyin 600mg  q 6 hrs for one week, #28, no refills.  Thanks [employer]

## 2013-10-30 NOTE — Telephone Encounter (Signed)
Attempted to call patient. NO answer. Left message stating we are calling with results and of information of prescriptions that have been sent to your pharmacy, please call clinic. Letter sent.

## 2013-11-06 ENCOUNTER — Encounter (HOSPITAL_COMMUNITY): Payer: Self-pay

## 2013-11-13 ENCOUNTER — Ambulatory Visit (INDEPENDENT_AMBULATORY_CARE_PROVIDER_SITE_OTHER): Payer: Self-pay | Admitting: Obstetrics and Gynecology

## 2013-11-13 VITALS — BP 108/71 | HR 98 | Temp 96.9°F | Wt 127.0 lb

## 2013-11-13 DIAGNOSIS — A499 Bacterial infection, unspecified: Secondary | ICD-10-CM

## 2013-11-13 DIAGNOSIS — O360990 Maternal care for other rhesus isoimmunization, unspecified trimester, not applicable or unspecified: Secondary | ICD-10-CM

## 2013-11-13 DIAGNOSIS — B9689 Other specified bacterial agents as the cause of diseases classified elsewhere: Secondary | ICD-10-CM

## 2013-11-13 DIAGNOSIS — N760 Acute vaginitis: Secondary | ICD-10-CM

## 2013-11-13 LAB — POCT URINALYSIS DIP (DEVICE)
Bilirubin Urine: NEGATIVE
Glucose, UA: NEGATIVE mg/dL
HGB URINE DIPSTICK: NEGATIVE
KETONES UR: NEGATIVE mg/dL
Nitrite: NEGATIVE
Protein, ur: NEGATIVE mg/dL
SPECIFIC GRAVITY, URINE: 1.02 (ref 1.005–1.030)
UROBILINOGEN UA: 0.2 mg/dL (ref 0.0–1.0)
pH: 7.5 (ref 5.0–8.0)

## 2013-11-13 NOTE — Patient Instructions (Signed)
Bacterial Vaginosis Bacterial vaginosis is a vaginal infection that occurs when the normal balance of bacteria in the vagina is disrupted. It results from an overgrowth of certain bacteria. This is the most common vaginal infection in women of childbearing age. Treatment is important to prevent complications, especially in pregnant women, as it can cause a premature delivery. CAUSES  Bacterial vaginosis is caused by an increase in harmful bacteria that are normally present in smaller amounts in the vagina. Several different kinds of bacteria can cause bacterial vaginosis. However, the reason that the condition develops is not fully understood. RISK FACTORS Certain activities or behaviors can put you at an increased risk of developing bacterial vaginosis, including:  Having a new sex partner or multiple sex partners.  Douching.  Using an intrauterine device (IUD) for contraception. Women do not get bacterial vaginosis from toilet seats, bedding, swimming pools, or contact with objects around them. SIGNS AND SYMPTOMS  Some women with bacterial vaginosis have no signs or symptoms. Common symptoms include:  Grey vaginal discharge.  A fishlike odor with discharge, especially after sexual intercourse.  Itching or burning of the vagina and vulva.  Burning or pain with urination. DIAGNOSIS  Your health care provider will take a medical history and examine the vagina for signs of bacterial vaginosis. A sample of vaginal fluid may be taken. Your health care provider will look at this sample under a microscope to check for bacteria and abnormal cells. A vaginal pH test may also be done.  TREATMENT  Bacterial vaginosis may be treated with antibiotic medicines. These may be given in the form of a pill or a vaginal cream. A second round of antibiotics may be prescribed if the condition comes back after treatment.  HOME CARE INSTRUCTIONS   Only take over-the-counter or prescription medicines as  directed by your health care provider.  If antibiotic medicine was prescribed, take it as directed. Make sure you finish it even if you start to feel better.  Do not have sex until treatment is completed.  Tell all sexual partners that you have a vaginal infection. They should see their health care provider and be treated if they have problems, such as a mild rash or itching.  Practice safe sex by using condoms and only having one sex partner. SEEK MEDICAL CARE IF:   Your symptoms are not improving after 3 days of treatment.  You have increased discharge or pain.  You have a fever. MAKE SURE YOU:   Understand these instructions.  Will watch your condition.  Will get help right away if you are not doing well or get worse. FOR MORE INFORMATION  Centers for Disease Control and Prevention, Division of STD Prevention: www.cdc.gov/std American Sexual Health Association (ASHA): www.ashastd.org  Document Released: 05/03/2005 Document Revised: 02/21/2013 Document Reviewed: 12/13/2012 [employer] Patient Information 2015 [employer]. This information is not intended to replace advice given to you by your health care provider. Make sure you discuss any questions you have with your health care provider.  

## 2013-11-13 NOTE — Progress Notes (Signed)
Mod clue and few yeast on [facility] 10/26/13. Still with increased malodorous discharge. No itch. Pt did not take antibiotics because she did not know about it. Will get meds and start.  Undecided re circ. Wants Depo

## 2013-11-13 NOTE — Progress Notes (Signed)
Pt states she was unaware of needing to take any medication for infection.  Pt states she feels like the baby is trying to turn or crush her from the inside.

## 2013-11-20 ENCOUNTER — Encounter: Payer: Self-pay | Admitting: Obstetrics & Gynecology

## 2013-11-26 ENCOUNTER — Ambulatory Visit (INDEPENDENT_AMBULATORY_CARE_PROVIDER_SITE_OTHER): Payer: Medicaid Other | Admitting: Family Medicine

## 2013-11-26 VITALS — BP 103/87 | HR 98 | Temp 97.5°F | Wt 125.6 lb

## 2013-11-26 DIAGNOSIS — B3731 Acute candidiasis of vulva and vagina: Secondary | ICD-10-CM

## 2013-11-26 DIAGNOSIS — A499 Bacterial infection, unspecified: Secondary | ICD-10-CM

## 2013-11-26 DIAGNOSIS — B9689 Other specified bacterial agents as the cause of diseases classified elsewhere: Secondary | ICD-10-CM

## 2013-11-26 DIAGNOSIS — Z3685 Encounter for antenatal screening for Streptococcus B: Secondary | ICD-10-CM

## 2013-11-26 DIAGNOSIS — Z113 Encounter for screening for infections with a predominantly sexual mode of transmission: Secondary | ICD-10-CM

## 2013-11-26 DIAGNOSIS — B373 Candidiasis of vulva and vagina: Secondary | ICD-10-CM

## 2013-11-26 DIAGNOSIS — O0933 Supervision of pregnancy with insufficient antenatal care, third trimester: Secondary | ICD-10-CM

## 2013-11-26 DIAGNOSIS — N760 Acute vaginitis: Secondary | ICD-10-CM

## 2013-11-26 DIAGNOSIS — Z3480 Encounter for supervision of other normal pregnancy, unspecified trimester: Secondary | ICD-10-CM

## 2013-11-26 LAB — POCT URINALYSIS DIP (DEVICE)
BILIRUBIN URINE: NEGATIVE
GLUCOSE, UA: NEGATIVE mg/dL
Hgb urine dipstick: NEGATIVE
KETONES UR: NEGATIVE mg/dL
NITRITE: NEGATIVE
PH: 7.5 (ref 5.0–8.0)
Protein, ur: NEGATIVE mg/dL
Specific Gravity, Urine: 1.015 (ref 1.005–1.030)
Urobilinogen, UA: 0.2 mg/dL (ref 0.0–1.0)

## 2013-11-26 LAB — OB RESULTS CONSOLE GBS: GBS: NEGATIVE

## 2013-11-26 LAB — OB RESULTS CONSOLE GC/CHLAMYDIA
Chlamydia: NEGATIVE
GC PROBE AMP, GENITAL: NEGATIVE

## 2013-11-26 MED ORDER — FLUCONAZOLE 150 MG PO TABS: 150.0000 mg | ORAL_TABLET | Freq: Once | ORAL | Status: DC

## 2013-11-26 MED ORDER — CLINDAMYCIN HCL 300 MG PO CAPS: 300.0000 mg | ORAL_CAPSULE | Freq: Two times a day (BID) | ORAL | Status: DC

## 2013-11-26 NOTE — Progress Notes (Signed)
S: 23 yo [id number] @ [redacted]w[redacted]d here for ROBV - doing well.  - having vaginal pains from time to time.  - has not picked up rx for BV or yeast infection. Says she will get it today.  - no regular ctx, lof, vb. +FM   O: see flowsheet.   A/P - 36 week cultures today  - PTL precautions discussed.  - f/u in 1 week.

## 2013-11-26 NOTE — Patient Instructions (Signed)
Third Trimester of Pregnancy The third trimester is from week 29 through week 42, months 7 through 9. The third trimester is a time when the fetus is growing rapidly. At the end of the ninth month, the fetus is about 20 inches in length and weighs 6-10 pounds.  BODY CHANGES Your body goes through many changes during pregnancy. The changes vary from woman to woman.   Your weight will continue to increase. You can expect to gain 25-35 pounds (11-16 kg) by the end of the pregnancy.  You may begin to get stretch marks on your hips, abdomen, and breasts.  You may urinate more often because the fetus is moving lower into your pelvis and pressing on your bladder.  You may develop or continue to have heartburn as a result of your pregnancy.  You may develop constipation because certain hormones are causing the muscles that push waste through your intestines to slow down.  You may develop hemorrhoids or swollen, bulging veins (varicose veins).  You may have pelvic pain because of the weight gain and pregnancy hormones relaxing your joints between the bones in your pelvis. Backaches may result from overexertion of the muscles supporting your posture.  You may have changes in your hair. These can include thickening of your hair, rapid growth, and changes in texture. Some women also have hair loss during or after pregnancy, or hair that feels dry or thin. Your hair will most likely return to normal after your baby is born.  Your breasts will continue to grow and be tender. A yellow discharge may leak from your breasts called colostrum.  Your belly button may stick out.  You may feel short of breath because of your expanding uterus.  You may notice the fetus "dropping," or moving lower in your abdomen.  You may have a bloody mucus discharge. This usually occurs a few days to a week before labor begins.  Your cervix becomes thin and soft (effaced) near your due date. WHAT TO EXPECT AT YOUR PRENATAL  EXAMS  You will have prenatal exams every 2 weeks until week 36. Then, you will have weekly prenatal exams. During a routine prenatal visit:  You will be weighed to make sure you and the fetus are growing normally.  Your blood pressure is taken.  Your abdomen will be measured to track your baby's growth.  The fetal heartbeat will be listened to.  Any test results from the previous visit will be discussed.  You may have a cervical check near your due date to see if you have effaced. At around 36 weeks, your caregiver will check your cervix. At the same time, your caregiver will also perform a test on the secretions of the vaginal tissue. This test is to determine if a type of bacteria, Group B streptococcus, is present. Your caregiver will explain this further. Your caregiver may ask you:  What your birth plan is.  How you are feeling.  If you are feeling the baby move.  If you have had any abnormal symptoms, such as leaking fluid, bleeding, severe headaches, or abdominal cramping.  If you have any questions. Other tests or screenings that may be performed during your third trimester include:  Blood tests that check for low iron levels (anemia).  Fetal testing to check the health, activity level, and growth of the fetus. Testing is done if you have certain medical conditions or if there are problems during the pregnancy. FALSE LABOR You may feel small, irregular contractions that   eventually go away. These are called Braxton Hicks contractions, or false labor. Contractions may last for hours, days, or even weeks before true labor sets in. If contractions come at regular intervals, intensify, or become painful, it is best to be seen by your caregiver.  SIGNS OF LABOR   Menstrual-like cramps.  Contractions that are 5 minutes apart or less.  Contractions that start on the top of the uterus and spread down to the lower abdomen and back.  A sense of increased pelvic pressure or back  pain.  A watery or bloody mucus discharge that comes from the vagina. If you have any of these signs before the 37th week of pregnancy, call your caregiver right away. You need to go to the hospital to get checked immediately. HOME CARE INSTRUCTIONS   Avoid all smoking, herbs, alcohol, and unprescribed drugs. These chemicals affect the formation and growth of the baby.  Follow your caregiver's instructions regarding medicine use. There are medicines that are either safe or unsafe to take during pregnancy.  Exercise only as directed by your caregiver. Experiencing uterine cramps is a good sign to stop exercising.  Continue to eat regular, healthy meals.  Wear a good support bra for breast tenderness.  Do not use hot tubs, steam rooms, or saunas.  Wear your seat belt at all times when driving.  Avoid raw meat, uncooked cheese, cat litter boxes, and soil used by cats. These carry germs that can cause birth defects in the baby.  Take your prenatal vitamins.  Try taking a stool softener (if your caregiver approves) if you develop constipation. Eat more high-fiber foods, such as fresh vegetables or fruit and whole grains. Drink plenty of fluids to keep your urine clear or pale yellow.  Take warm sitz baths to soothe any pain or discomfort caused by hemorrhoids. Use hemorrhoid cream if your caregiver approves.  If you develop varicose veins, wear support hose. Elevate your feet for 15 minutes, 3-4 times a day. Limit salt in your diet.  Avoid heavy lifting, wear low heal shoes, and practice good posture.  Rest a lot with your legs elevated if you have leg cramps or low back pain.  Visit your dentist if you have not gone during your pregnancy. Use a soft toothbrush to brush your teeth and be gentle when you floss.  A sexual relationship may be continued unless your caregiver directs you otherwise.  Do not travel far distances unless it is absolutely necessary and only with the approval  of your caregiver.  Take prenatal classes to understand, practice, and ask questions about the labor and delivery.  Make a trial run to the hospital.  Pack your hospital bag.  Prepare the baby's nursery.  Continue to go to all your prenatal visits as directed by your caregiver. SEEK MEDICAL CARE IF:  You are unsure if you are in labor or if your water has broken.  You have dizziness.  You have mild pelvic cramps, pelvic pressure, or nagging pain in your abdominal area.  You have persistent nausea, vomiting, or diarrhea.  You have a bad smelling vaginal discharge.  You have pain with urination. SEEK IMMEDIATE MEDICAL CARE IF:   You have a fever.  You are leaking fluid from your vagina.  You have spotting or bleeding from your vagina.  You have severe abdominal cramping or pain.  You have rapid weight loss or gain.  You have shortness of breath with chest pain.  You notice sudden or extreme swelling   of your face, hands, ankles, feet, or legs.  You have not felt your baby move in over an hour.  You have severe headaches that do not go away with medicine.  You have vision changes. Document Released: 04/27/2001 Document Revised: 05/08/2013 Document Reviewed: 07/04/2012 [employer] Patient Information 2015 [employer]. This information is not intended to replace advice given to you by your health care provider. Make sure you discuss any questions you have with your health care provider.  

## 2013-11-26 NOTE — Progress Notes (Signed)
Reports pelvic pressure and occasional sharp vaginal pains.  Reports thick, white discharge--- has not picked up RX for BV or yeast infection-- medications re-prescribed to go to [facility] on [address] so patient can go get them.  Reports being nauseas while eating since last week.

## 2013-11-27 LAB — GC/CHLAMYDIA PROBE AMP
CT PROBE, AMP APTIMA: NEGATIVE
GC Probe RNA: NEGATIVE

## 2013-11-29 LAB — CULTURE, BETA STREP (GROUP B ONLY)

## 2013-11-30 ENCOUNTER — Encounter: Payer: Self-pay | Admitting: Family Medicine

## 2013-12-06 ENCOUNTER — Ambulatory Visit (INDEPENDENT_AMBULATORY_CARE_PROVIDER_SITE_OTHER): Payer: Medicaid Other | Admitting: Obstetrics & Gynecology

## 2013-12-06 VITALS — BP 109/86 | HR 88 | Temp 98.8°F | Wt 128.4 lb

## 2013-12-06 DIAGNOSIS — O0930 Supervision of pregnancy with insufficient antenatal care, unspecified trimester: Secondary | ICD-10-CM

## 2013-12-06 DIAGNOSIS — O0933 Supervision of pregnancy with insufficient antenatal care, third trimester: Secondary | ICD-10-CM

## 2013-12-06 LAB — POCT URINALYSIS DIP (DEVICE)
Bilirubin Urine: NEGATIVE
Glucose, UA: NEGATIVE mg/dL
Hgb urine dipstick: NEGATIVE
KETONES UR: NEGATIVE mg/dL
LEUKOCYTES UA: NEGATIVE
Nitrite: NEGATIVE
Protein, ur: NEGATIVE mg/dL
SPECIFIC GRAVITY, URINE: 1.02 (ref 1.005–1.030)
Urobilinogen, UA: 0.2 mg/dL (ref 0.0–1.0)
pH: 6.5 (ref 5.0–8.0)

## 2013-12-06 NOTE — Progress Notes (Signed)
Pain-upper back, shoulder    Pt reports not feeling the baby move as much in past two days Had x1 episode of clear snot like mucous Pt reports also only taking clendomycin once a day and not q 12 hours

## 2013-12-06 NOTE — Progress Notes (Signed)
Pt is frustrated at being pregnant.  Explained benefits of going into labor naturally.  No issues.  RTC 1 week.

## 2013-12-07 ENCOUNTER — Encounter: Payer: Self-pay | Admitting: General Practice

## 2013-12-13 ENCOUNTER — Ambulatory Visit (INDEPENDENT_AMBULATORY_CARE_PROVIDER_SITE_OTHER): Payer: Medicaid Other | Admitting: Obstetrics & Gynecology

## 2013-12-13 VITALS — BP 111/75 | HR 101 | Temp 97.7°F | Wt 127.8 lb

## 2013-12-13 DIAGNOSIS — O368131 Decreased fetal movements, third trimester, fetus 1: Secondary | ICD-10-CM

## 2013-12-13 DIAGNOSIS — O368190 Decreased fetal movements, unspecified trimester, not applicable or unspecified: Secondary | ICD-10-CM

## 2013-12-13 DIAGNOSIS — O3090 Multiple gestation, unspecified, unspecified trimester: Secondary | ICD-10-CM

## 2013-12-13 LAB — POCT URINALYSIS DIP (DEVICE)
BILIRUBIN URINE: NEGATIVE
Glucose, UA: NEGATIVE mg/dL
HGB URINE DIPSTICK: NEGATIVE
KETONES UR: NEGATIVE mg/dL
Nitrite: NEGATIVE
Protein, ur: 30 mg/dL — AB
SPECIFIC GRAVITY, URINE: 1.025 (ref 1.005–1.030)
UROBILINOGEN UA: 0.2 mg/dL (ref 0.0–1.0)
pH: 6.5 (ref 5.0–8.0)

## 2013-12-13 NOTE — Progress Notes (Signed)
Patient reports N/V since this morning and diarrhea for the last two days. Reports on and off cramping with lower back pain. Reports baby decreased movement for the last day; felt him move this morning on the way to clinic.

## 2013-12-13 NOTE — Patient Instructions (Signed)

## 2013-12-13 NOTE — Progress Notes (Signed)
No change in mucus discharge. Has few contractions, decreased fetal movement. Labor precautions given. NST ordered

## 2013-12-18 ENCOUNTER — Inpatient Hospital Stay (HOSPITAL_COMMUNITY): Payer: Medicaid Other | Admitting: Anesthesiology

## 2013-12-18 ENCOUNTER — Encounter (HOSPITAL_COMMUNITY): Payer: Medicaid Other | Admitting: Anesthesiology

## 2013-12-18 ENCOUNTER — Encounter (HOSPITAL_COMMUNITY): Payer: Self-pay | Admitting: *Deleted

## 2013-12-18 ENCOUNTER — Inpatient Hospital Stay (HOSPITAL_COMMUNITY)
Admission: AD | Admit: 2013-12-18 | Discharge: 2013-12-19 | DRG: 775 | Disposition: A | Discharge status: Home / Self Care | Payer: Medicaid Other | Source: Ambulatory Visit | Attending: Obstetrics & Gynecology | Admitting: Obstetrics & Gynecology

## 2013-12-18 DIAGNOSIS — O4290 Premature rupture of membranes, unspecified as to length of time between rupture and onset of labor, unspecified weeks of gestation: Principal | ICD-10-CM | POA: Diagnosis present

## 2013-12-18 DIAGNOSIS — O99344 Other mental disorders complicating childbirth: Secondary | ICD-10-CM | POA: Diagnosis present

## 2013-12-18 DIAGNOSIS — IMO0001 Reserved for inherently not codable concepts without codable children: Secondary | ICD-10-CM

## 2013-12-18 DIAGNOSIS — D649 Anemia, unspecified: Secondary | ICD-10-CM | POA: Diagnosis present

## 2013-12-18 DIAGNOSIS — Z8249 Family history of ischemic heart disease and other diseases of the circulatory system: Secondary | ICD-10-CM

## 2013-12-18 DIAGNOSIS — O360990 Maternal care for other rhesus isoimmunization, unspecified trimester, not applicable or unspecified: Secondary | ICD-10-CM | POA: Diagnosis present

## 2013-12-18 DIAGNOSIS — F1210 Cannabis abuse, uncomplicated: Secondary | ICD-10-CM | POA: Diagnosis present

## 2013-12-18 DIAGNOSIS — O9902 Anemia complicating childbirth: Secondary | ICD-10-CM | POA: Diagnosis present

## 2013-12-18 DIAGNOSIS — O479 False labor, unspecified: Secondary | ICD-10-CM | POA: Diagnosis present

## 2013-12-18 DIAGNOSIS — Z87891 Personal history of nicotine dependence: Secondary | ICD-10-CM

## 2013-12-18 HISTORY — DX: Bacterial infection, unspecified: A49.9

## 2013-12-18 LAB — RPR

## 2013-12-18 LAB — CBC
HCT: 34.3 % — ABNORMAL LOW (ref 36.0–46.0)
HEMOGLOBIN: 11.7 g/dL — AB (ref 12.0–15.0)
MCH: 31 pg (ref 26.0–34.0)
MCHC: 34.1 g/dL (ref 30.0–36.0)
MCV: 91 fL (ref 78.0–100.0)
PLATELETS: 217 10*3/uL (ref 150–400)
RBC: 3.77 MIL/uL — ABNORMAL LOW (ref 3.87–5.11)
RDW: 13.4 % (ref 11.5–15.5)
WBC: 20.5 10*3/uL — ABNORMAL HIGH (ref 4.0–10.5)

## 2013-12-18 MED ORDER — OXYTOCIN 40 UNITS IN LACTATED RINGERS INFUSION - SIMPLE MED
62.5000 mL/h | INTRAVENOUS | Status: DC
  Administered 2013-12-18: 62.5 mL/h via INTRAVENOUS
  Filled 2013-12-18: qty 1000

## 2013-12-18 MED ORDER — SIMETHICONE 80 MG PO CHEW
80.0000 mg | CHEWABLE_TABLET | ORAL | Status: DC | PRN
  Administered 2013-12-19: 80 mg via ORAL

## 2013-12-18 MED ORDER — BENZOCAINE-MENTHOL 20-0.5 % EX AERO
1.0000 "application " | INHALATION_SPRAY | CUTANEOUS | Status: DC | PRN
  Administered 2013-12-18: 1 via TOPICAL
  Filled 2013-12-18: qty 56

## 2013-12-18 MED ORDER — SENNOSIDES-DOCUSATE SODIUM 8.6-50 MG PO TABS
2.0000 | ORAL_TABLET | ORAL | Status: DC
  Administered 2013-12-19: 2 via ORAL
  Filled 2013-12-18: qty 2

## 2013-12-18 MED ORDER — IBUPROFEN 600 MG PO TABS: 600.0000 mg | ORAL_TABLET | Freq: Four times a day (QID) | ORAL | Status: DC | PRN

## 2013-12-18 MED ORDER — DIPHENHYDRAMINE HCL 25 MG PO CAPS: 25.0000 mg | ORAL_CAPSULE | Freq: Four times a day (QID) | ORAL | Status: DC | PRN

## 2013-12-18 MED ORDER — LACTATED RINGERS IV SOLN: 500.0000 mL | INTRAVENOUS | Status: DC | PRN

## 2013-12-18 MED ORDER — OXYCODONE-ACETAMINOPHEN 5-325 MG PO TABS
1.0000 | ORAL_TABLET | ORAL | Status: DC | PRN
  Administered 2013-12-18: 1 via ORAL
  Filled 2013-12-18: qty 1

## 2013-12-18 MED ORDER — ZOLPIDEM TARTRATE 5 MG PO TABS: 5.0000 mg | ORAL_TABLET | Freq: Every evening | ORAL | Status: DC | PRN

## 2013-12-18 MED ORDER — PNEUMOCOCCAL VAC POLYVALENT 25 MCG/0.5ML IJ INJ: 0.5000 mL | INJECTION | INTRAMUSCULAR | Status: DC

## 2013-12-18 MED ORDER — LANOLIN HYDROUS EX OINT: TOPICAL_OINTMENT | CUTANEOUS | Status: DC | PRN

## 2013-12-18 MED ORDER — LACTATED RINGERS IV SOLN
INTRAVENOUS | Status: DC
  Administered 2013-12-18 (×3): via INTRAVENOUS

## 2013-12-18 MED ORDER — OXYCODONE-ACETAMINOPHEN 5-325 MG PO TABS
1.0000 | ORAL_TABLET | ORAL | Status: DC | PRN
Start: 2013-12-18 — End: 2013-12-19
  Administered 2013-12-18 – 2013-12-19 (×2): 1 via ORAL
  Administered 2013-12-19: 2 via ORAL
  Filled 2013-12-18: qty 1
  Filled 2013-12-18: qty 2
  Filled 2013-12-18: qty 1

## 2013-12-18 MED ORDER — DIPHENHYDRAMINE HCL 50 MG/ML IJ SOLN: 12.5000 mg | INTRAMUSCULAR | Status: DC | PRN

## 2013-12-18 MED ORDER — OXYTOCIN 40 UNITS IN LACTATED RINGERS INFUSION - SIMPLE MED
1.0000 m[IU]/min | INTRAVENOUS | Status: DC
  Administered 2013-12-18: 2 m[IU]/min via INTRAVENOUS

## 2013-12-18 MED ORDER — PHENYLEPHRINE 40 MCG/ML (10ML) SYRINGE FOR IV PUSH (FOR BLOOD PRESSURE SUPPORT)
80.0000 ug | PREFILLED_SYRINGE | INTRAVENOUS | Status: DC | PRN
  Filled 2013-12-18: qty 2

## 2013-12-18 MED ORDER — LIDOCAINE HCL (PF) 1 % IJ SOLN
30.0000 mL | INTRAMUSCULAR | Status: DC | PRN
  Filled 2013-12-18: qty 30

## 2013-12-18 MED ORDER — ONDANSETRON HCL 4 MG PO TABS: 4.0000 mg | ORAL_TABLET | ORAL | Status: DC | PRN

## 2013-12-18 MED ORDER — ONDANSETRON HCL 4 MG/2ML IJ SOLN: 4.0000 mg | INTRAMUSCULAR | Status: DC | PRN

## 2013-12-18 MED ORDER — OXYTOCIN BOLUS FROM INFUSION
500.0000 mL | INTRAVENOUS | Status: DC
  Administered 2013-12-18: 500 mL via INTRAVENOUS

## 2013-12-18 MED ORDER — LIDOCAINE HCL (PF) 1 % IJ SOLN
INTRAMUSCULAR | Status: DC | PRN
  Administered 2013-12-18 (×2): 5 mL

## 2013-12-18 MED ORDER — PRENATAL MULTIVITAMIN CH: 1.0000 | ORAL_TABLET | Freq: Every day | ORAL | Status: DC

## 2013-12-18 MED ORDER — ONDANSETRON HCL 4 MG/2ML IJ SOLN: 4.0000 mg | Freq: Four times a day (QID) | INTRAMUSCULAR | Status: DC | PRN

## 2013-12-18 MED ORDER — IBUPROFEN 600 MG PO TABS
600.0000 mg | ORAL_TABLET | Freq: Four times a day (QID) | ORAL | Status: DC
  Administered 2013-12-18 – 2013-12-19 (×4): 600 mg via ORAL
  Filled 2013-12-18 (×4): qty 1

## 2013-12-18 MED ORDER — CITRIC ACID-SODIUM CITRATE 334-500 MG/5ML PO SOLN: 30.0000 mL | ORAL | Status: DC | PRN

## 2013-12-18 MED ORDER — TETANUS-DIPHTH-ACELL PERTUSSIS 5-2.5-18.5 LF-MCG/0.5 IM SUSP: 0.5000 mL | Freq: Once | INTRAMUSCULAR | Status: DC

## 2013-12-18 MED ORDER — ACETAMINOPHEN 325 MG PO TABS: 650.0000 mg | ORAL_TABLET | ORAL | Status: DC | PRN

## 2013-12-18 MED ORDER — TERBUTALINE SULFATE 1 MG/ML IJ SOLN: 0.2500 mg | Freq: Once | INTRAMUSCULAR | Status: DC | PRN

## 2013-12-18 MED ORDER — PHENYLEPHRINE 40 MCG/ML (10ML) SYRINGE FOR IV PUSH (FOR BLOOD PRESSURE SUPPORT)
80.0000 ug | PREFILLED_SYRINGE | INTRAVENOUS | Status: DC | PRN
  Filled 2013-12-18: qty 2
  Filled 2013-12-18: qty 10

## 2013-12-18 MED ORDER — EPHEDRINE 5 MG/ML INJ
10.0000 mg | INTRAVENOUS | Status: DC | PRN
  Administered 2013-12-18: 5 mg via INTRAVENOUS
  Filled 2013-12-18: qty 2

## 2013-12-18 MED ORDER — DIBUCAINE 1 % RE OINT: 1.0000 "application " | TOPICAL_OINTMENT | RECTAL | Status: DC | PRN

## 2013-12-18 MED ORDER — FENTANYL 2.5 MCG/ML BUPIVACAINE 1/10 % EPIDURAL INFUSION (WH - ANES)
14.0000 mL/h | INTRAMUSCULAR | Status: DC | PRN
  Administered 2013-12-18 (×2): 14 mL/h via EPIDURAL
  Filled 2013-12-18 (×2): qty 125

## 2013-12-18 MED ORDER — WITCH HAZEL-GLYCERIN EX PADS: 1.0000 "application " | MEDICATED_PAD | CUTANEOUS | Status: DC | PRN

## 2013-12-18 MED ORDER — EPHEDRINE 5 MG/ML INJ
10.0000 mg | INTRAVENOUS | Status: DC | PRN
  Filled 2013-12-18: qty 4
  Filled 2013-12-18: qty 2

## 2013-12-18 MED ORDER — LACTATED RINGERS IV SOLN
500.0000 mL | Freq: Once | INTRAVENOUS | Status: AC
  Administered 2013-12-18: 1000 mL via INTRAVENOUS

## 2013-12-18 MED ORDER — MEASLES, MUMPS & RUBELLA VAC ~~LOC~~ INJ
0.5000 mL | INJECTION | Freq: Once | SUBCUTANEOUS | Status: AC
  Administered 2013-12-19: 0.5 mL via SUBCUTANEOUS
  Filled 2013-12-18: qty 0.5

## 2013-12-18 NOTE — H&P (Signed)
LABOR ADMISSION HISTORY AND PHYSICAL  Patricia Richards is a 23 y.o. female G33P1011 with IUP at [redacted]w[redacted]d by 9 wk [place] presenting for term labor. Pt presents w/ contractions and SROM at ~0530 (clear fluid). She reports +FMs, no VB, no blurry vision, headaches or peripheral edema, and RUQ pain. She desires an epidural for labor pain control. She plans on breast and bottle feeding. She request Depo for birth control. Epidural ordered  Dating: By 9 wk [place] --->  Estimated Date of Delivery: 12/23/13  Sono:    @[redacted]w[redacted]d , CWD, normal anatomy  Prenatal History/Complications: LTC at  [redacted]w[redacted]d  Past Medical History: Past Medical History  Diagnosis Date  . Anemia   . Urinary tract infection     gets them frequently  . Pregnant   . Bacterial infection     Past Surgical History: Past Surgical History  Procedure Laterality Date  . Dilation and evacuation  11/28/2010    Obstetrical History: OB History   Grav Para Term Preterm Abortions TAB SAB Ect Mult Living   3 1 1  0 1 1 0 0 0 1      Gynecological History: OB History   Grav Para Term Preterm Abortions TAB SAB Ect Mult Living   3 1 1  0 1 1 0 0 0 1      Social History: History   Social History  . Marital Status: Single    Spouse Name: N/A    Number of Children: N/A  . Years of Education: N/A   Social History Main Topics  . Smoking status: Former Research scientist (life sciences)  . Smokeless tobacco: Never Used  . Alcohol Use: Yes     Comment: Last use of marijuana was 1 mo. ago per pt. 07/23/13  . Drug Use: Yes    Special: Other-see comments, Marijuana  . Sexual Activity: Yes    Birth Control/ Protection: None   Other Topics Concern  . None   Social History Narrative  . None    Family History: Family History  Problem Relation Age of Onset  . Hypertension Father   . Cancer Father   . Cancer Maternal Grandmother     Allergies: Allergies  Allergen Reactions  . Metronidazole Shortness Of Breath    Facility-administered medications prior to  admission  Medication Dose Route Frequency Provider Last Rate Last Dose  . Tdap (BOOSTRIX) injection 0.5 mL  0.5 mL Intramuscular Once Lavonia Drafts, MD       Prescriptions prior to admission  Medication Sig Dispense Refill  . acetaminophen (TYLENOL) 500 MG tablet Take 1,000 mg by mouth every 6 (six) hours as needed for headache.       . clindamycin (CLEOCIN) 300 MG capsule Take 1 capsule (300 mg total) by mouth every 12 (twelve) hours.  14 capsule  0  . Prenatal Vit-Fe Fumarate-FA (PRENATAL MULTIVITAMIN) TABS tablet Take 1 tablet by mouth at bedtime.          Review of Systems   All systems reviewed and negative except as stated in HPI  Blood pressure 109/68, pulse 80, temperature 97.8 F (36.6 C), temperature source Oral, resp. rate 18, last menstrual period 03/01/2013. General appearance: alert, cooperative and appears stated age Lungs: clear to auscultation bilaterally Heart: regular rate and rhythm Abdomen: soft, non-tender; bowel sounds normal Pelvic: moderate pooling of clear liquid. Adequate Extremities: Homans sign is negative, no sign of DVT Presentation: cephalic Fetal monitoringBaseline: 140 bpm, Variability: Good {> 6 bpm) and Accelerations: Reactive Uterine activityDate/time of onset: 8/4@3am , Frequency:  Every 2-3 minutes, Duration: 60 seconds and Intensity: strong Dilation: 5 Effacement (%): 70 Station: -2 Exam by:: Osborne Casco RN   Prenatal labs: ABO, Rh: A/NEG/-- (04/14 0937) Antibody: NEG (04/14 0937) Rubella:  NI RPR: NON REAC (05/26 1201)  HBsAg: NEGATIVE (04/14 0539)  HIV: NONREACTIVE (05/26 1201)  GBS: Negative (07/13 0000)  1 hr Glucola  120 (third trimester) Genetic screening  Too late Anatomy US WNL   Prenatal Transfer Tool  Maternal Diabetes: No Genetic Screening: Declined Maternal Ultrasounds/Referrals: Normal Fetal Ultrasounds or other Referrals:  None Maternal Substance Abuse:  No Significant Maternal Medications:   None Significant Maternal Lab Results: Lab values include: Group B Strep negative, Rh negative     No results found for this or any previous visit (from the past 24 hour(s)).  Patient Active Problem List   Diagnosis Date Noted  . BV (bacterial vaginosis) 11/13/2013  . Rubella non-immune status, antepartum 08/29/2013  . Late prenatal care 08/28/2013  . Rh negative, antepartum (A neg blood type) 08/28/2013    Assessment: Patricia Richards is a 23 y.o. G3P1011 at [redacted]w[redacted]d here for active labor and SROM at 0530 w/ Cat I strip.  #Labor: Expectant management #Pain: Epidural #FWB: Cat I, reassuring #ID:  GBS neg #MOF: Breast + Bottle #MOC:Depo #Circ:   Desires, but does not have funds currently.  Tula Nakayama 12/18/2013, 6:05 AM

## 2013-12-18 NOTE — MAU Note (Signed)
Pt started contracting at 0200, leaking clear fluid.

## 2013-12-18 NOTE — Progress Notes (Signed)
Patricia Richards is a 23 y.o. G3P1011 at [redacted]w[redacted]d by ultrasound admitted for active labor, rupture of membranes  Subjective: Pt. Comfortable feeling little pressure, but more than previously. She has no complaints at this time.   Objective: BP 111/66  Pulse 117  Temp(Src) 98.3 F (36.8 C) (Oral)  Resp 18  Ht 5\' 2"  (1.575 m)  Wt 57.607 kg (127 lb)  BMI 23.22 kg/m2  SpO2 100%  LMP 03/01/2013   Total I/O In: -  Out: 1150 [Urine:1150]  FHT:  FHR: 135 bpm, variability: moderate,  accelerations:  Present,  decelerations:  Present Variable decels with contractions. Likely due to advanced location of baby  UC:   regular, every 3-4 minutes SVE:   Dilation: Lip/rim Effacement (%): 100 Station: +2 Exam by:: Dr. Minda Ditto  Labs: Lab Results  Component Value Date   WBC 20.5* 12/18/2013   HGB 11.7* 12/18/2013   HCT 34.3* 12/18/2013   MCV 91.0 12/18/2013   PLT 217 12/18/2013    Assessment / Plan: Augmentation of labor, progressing well  Labor: Progressing normally Preeclampsia:  no signs or symptoms of toxicity Fetal Wellbeing:  Category II overall reassuring Pain Control:  Epidural I/D:  n/a Anticipated MOD:  NSVD  Patricia Richards 12/18/2013, 3:27 PM

## 2013-12-18 NOTE — Progress Notes (Signed)
Patricia Richards is a 23 y.o. G3P1011 at [redacted]w[redacted]d by ultrasound admitted for active labor, PROM  Subjective: Pt. Comfortable not feeling pressure, or contractions at this time. She has no complaints.   Objective: BP 99/56  Pulse 107  Temp(Src) 98 F (36.7 C) (Oral)  Resp 18  Ht 5\' 2"  (1.575 m)  Wt 57.607 kg (127 lb)  BMI 23.22 kg/m2  SpO2 100%  LMP 03/01/2013   Total I/O In: -  Out: 150 [Urine:150]  FHT:  FHR: 145 bpm, variability: moderate,  accelerations:  Present,  decelerations:  Absent UC:   regular, every 2-4 minutes SVE:  Dilation: Lip Effacement %: 100 Station: 0  Labs: Lab Results  Component Value Date   WBC 20.5* 12/18/2013   HGB 11.7* 12/18/2013   HCT 34.3* 12/18/2013   MCV 91.0 12/18/2013   PLT 217 12/18/2013    Assessment / Plan: Spontaneous labor, progressing normally  Labor: Progressing normally Preeclampsia:  no signs or symptoms of toxicity Fetal Wellbeing:  Category I Pain Control:  Epidural I/D:  n/a Anticipated MOD:  NSVD  Patricia Richards 12/18/2013, 10:22 AM

## 2013-12-18 NOTE — Progress Notes (Signed)
Patient ID: TULANI KIDNEY, female   DOB: Nov 13, 1990, 23 y.o.   MRN: [id number] Labor Progress Note  ASSESSMENT:   Patricia Richards 23 y.o. [id number] at [redacted]w[redacted]d in Active term labor   PLAN:  1) Labor curve reviewed.       Progress: Expectantly managing, progressing well             Plan: Expectant management  2) Fetal heart tracing reviewed.    Cat I, reassuring  3) GBS Status - Neg No results found for this basename: STREPBCULT    4) Other Problems Active Problems:   Active labor at term   SUBJECTIVE:  Endorses pain relief after epidural. Feels pressure with contractions.   OBJECTIVE:  Vital Signs: Patient Vitals for the past 2 hrs:  BP Temp Temp src Pulse Resp SpO2  12/18/13 0832 96/59 mmHg - - 101 18 -  12/18/13 0810 103/62 mmHg 97.6 F (36.4 C) Oral 105 18 -  12/18/13 0809 - - - 103 - 100 %  12/18/13 0805 100/64 mmHg - - 104 20 -  12/18/13 0804 - - - 101 - 96 %  12/18/13 0800 105/63 mmHg - - 101 18 -  12/18/13 0759 - - - 105 - 96 %  12/18/13 0755 108/76 mmHg - - 157 20 -  12/18/13 0754 - - - 100 - 99 %  12/18/13 0753 - - - 94 - 100 %  12/18/13 0752 102/37 mmHg - - 158 20 -  12/18/13 0751 - - - 98 - 91 %  12/18/13 0746 - - - 82 - 98 %  12/18/13 0745 94/63 mmHg - - 123 20 -  12/18/13 0741 - - - 72 - 100 %  12/18/13 0740 82/44 mmHg - - 73 18 -  12/18/13 0737 - - - 79 - 100 %  12/18/13 0735 81/47 mmHg - - 80 20 -  12/18/13 0732 - - - 96 - 98 %  12/18/13 0730 83/45 mmHg - - 83 20 -  12/18/13 0727 - - - 85 - 97 %  12/18/13 0725 93/54 mmHg - - 85 20 -  12/18/13 0722 - - - 94 - 100 %  12/18/13 0720 94/59 mmHg - - 90 20 -  12/18/13 0717 76/52 mmHg - - 74 20 88 %  12/18/13 0716 - - - 114 - 95 %  12/18/13 0714 110/69 mmHg - - 118 - -  12/18/13 9326 - - - 133 - -  12/18/13 0711 111/82 mmHg - - 117 - 100 %   SVE: Dilation: 7.5, Effacement (%): 100, Station: 0  FHR Monitoring Baseline Rate (A): 140 bpm   Accelerations: None Contraction Frequency (min):  2-4.5

## 2013-12-18 NOTE — Anesthesia Procedure Notes (Signed)
Epidural Patient location during procedure: OB Start time: 12/18/2013 7:17 AM  Staffing Anesthesiologist: Rudean Curt Performed by: anesthesiologist   Preanesthetic Checklist Completed: patient identified, site marked, surgical consent, pre-op evaluation, timeout performed, IV checked, risks and benefits discussed and monitors and equipment checked  Epidural Patient position: sitting Prep: site prepped and draped and DuraPrep Patient monitoring: continuous pulse ox and blood pressure Approach: midline Injection technique: LOR air  Needle:  Needle type: Tuohy  Needle gauge: 17 G Needle length: 9 cm and 9 Needle insertion depth: 5 cm cm Catheter type: closed end flexible Catheter size: 19 Gauge Catheter at skin depth: 10 cm Test dose: negative  Assessment Events: blood not aspirated, injection not painful, no injection resistance, negative IV test and no paresthesia  Additional Notes Patient identified.  Risk benefits discussed including failed block, incomplete pain control, headache, nerve damage, paralysis, blood pressure changes, nausea, vomiting, reactions to medication both toxic or allergic, and postpartum back pain.  Patient expressed understanding and wished to proceed.  All questions were answered.  Sterile technique used throughout procedure and epidural site dressed with sterile barrier dressing. No paresthesia or other complications noted.The patient did not experience any signs of intravascular injection such as tinnitus or metallic taste in mouth nor signs of intrathecal spread such as rapid motor block. Please see nursing notes for vital signs.

## 2013-12-18 NOTE — Anesthesia Preprocedure Evaluation (Signed)
Anesthesia Evaluation  Patient identified by MRN, date of birth, ID band Patient awake    Reviewed: Allergy & Precautions, H&P , Patient's Chart, lab work & pertinent test results  Airway Mallampati: II TM Distance: >3 FB Neck ROM: full    Dental   Pulmonary former smoker,  breath sounds clear to auscultation        Cardiovascular Rhythm:regular Rate:Normal     Neuro/Psych    GI/Hepatic   Endo/Other    Renal/GU      Musculoskeletal   Abdominal   Peds  Hematology   Anesthesia Other Findings   Reproductive/Obstetrics (+) Pregnancy                           Anesthesia Physical Anesthesia Plan  ASA: II  Anesthesia Plan: Epidural   Post-op Pain Management:    Induction:   Airway Management Planned:   Additional Equipment:   Intra-op Plan:   Post-operative Plan:   Informed Consent: I have reviewed the patients History and Physical, chart, labs and discussed the procedure including the risks, benefits and alternatives for the proposed anesthesia with the patient or authorized representative who has indicated his/her understanding and acceptance.     Plan Discussed with:   Anesthesia Plan Comments:         Anesthesia Quick Evaluation

## 2013-12-19 LAB — CBC
HCT: 28 % — ABNORMAL LOW (ref 36.0–46.0)
Hemoglobin: 9.5 g/dL — ABNORMAL LOW (ref 12.0–15.0)
MCH: 30.9 pg (ref 26.0–34.0)
MCHC: 33.9 g/dL (ref 30.0–36.0)
MCV: 91.2 fL (ref 78.0–100.0)
Platelets: 191 10*3/uL (ref 150–400)
RBC: 3.07 MIL/uL — ABNORMAL LOW (ref 3.87–5.11)
RDW: 13.4 % (ref 11.5–15.5)
WBC: 18.6 10*3/uL — ABNORMAL HIGH (ref 4.0–10.5)

## 2013-12-19 MED ORDER — RHO D IMMUNE GLOBULIN 1500 UNIT/2ML IJ SOSY
300.0000 ug | PREFILLED_SYRINGE | Freq: Once | INTRAMUSCULAR | Status: AC
  Administered 2013-12-19: 300 ug via INTRAVENOUS
  Filled 2013-12-19: qty 2

## 2013-12-19 MED ORDER — IBUPROFEN 200 MG PO TABS: 200.0000 mg | ORAL_TABLET | Freq: Four times a day (QID) | ORAL | Status: DC | PRN

## 2013-12-19 NOTE — Progress Notes (Addendum)
Post Partum Day 1  Subjective: up ad lib, voiding and tolerating PO complaining of moderate back pain and cramping minimally controlled by percocet and ibuprofen.    Objective: Blood pressure 101/67, pulse 74, temperature 96.6 F (35.9 C), temperature source Axillary, resp. rate 18, height 5\' 2"  (1.575 m), weight 57.607 kg (127 lb), last menstrual period 03/01/2013, SpO2 100.00%, unknown if currently breastfeeding.  Physical Exam:  General: alert, cooperative, appears stated age and no distress Lochia: appropriate Uterine Fundus: firm Incision: No incision DVT Evaluation: No evidence of DVT seen on physical exam. Negative Homan's sign. No cords or calf tenderness. No significant calf/ankle edema.   Recent Labs  12/18/13 0610 12/19/13 0615  HGB 11.7* 9.5*  HCT 34.3* 28.0*    Assessment/Plan: Discharge home once pain is controlled.   LOS: 1 day   Morley Kos 12/19/2013, 7:56 AM   I have seen and examined this patient and agree with above documentation in the PA's note.  Bottle feeding, wants depo, declines circ, reports that she is ready for discharge  Nila Nephew, MD OB Fellow 12/19/2013 8:41 AM

## 2013-12-19 NOTE — Discharge Summary (Signed)
Attestation of Attending Supervision of Fellow: Evaluation and management procedures were performed by the Fellow under my supervision and collaboration.  I have reviewed the Fellow's note and chart, and I agree with the management and plan.    

## 2013-12-19 NOTE — Discharge Summary (Signed)
Obstetric Discharge Summary Reason for Admission: onset of labor Prenatal Procedures: none Intrapartum Procedures: spontaneous vaginal delivery Postpartum Procedures: none Complications-Operative and Postpartum: none  Delivery Note At 5:08 PM a viable female was delivered via Vaginal, Spontaneous Delivery (Presentation: Left Occiput Anterior).  APGAR: 7, 9; weight 7 lb 9 oz (3430 g).   Placenta status: Intact, Spontaneous.  Cord: 3 vessels with the following complications: None.   Anesthesia: Epidural  Episiotomy: None Lacerations: bilateral Vaginal Suture Repair: 3.0 vicryl rapide Est. Blood Loss (mL): 300     Hospital Course:  Active Problems:   Active labor at term   Today: No acute events overnight.  Pt denies problems with ambulating, voiding or po intake.  She denies nausea or vomiting.  Pain is well controlled.  She has had flatus. She has not had bowel movement.  Lochia Moderate.  Plan for birth control is Depo-Provera.  Method of Feeding: bottle feeding, declines lactation consult although reports when she gets home she will try breast feeding  Patricia Richards is a 23 y.o. [id number] s/p TSVD.  Patient presented to OBT in active labor.  She has postoperative course that was uncomplicated including no problems with ambulating, PO intake, urination, pain, or bleeding. The pt feels ready to go home and  will be discharged with outpatient follow-up.    H/H: Lab Results  Component Value Date/Time   HGB 9.5* 12/19/2013  6:15 AM   HCT 28.0* 12/19/2013  6:15 AM    Discharge Diagnoses: Term Pregnancy-delivered  Discharge Information: Date: 12/19/13 Activity: pelvic rest Diet: routine  Medications: Ibuprofen and Colace Breast feeding:  Yes Condition: stable Instructions: refer to handout Discharge to: home       Discharge Instructions   Call MD for:  redness, tenderness, or signs of infection (pain, swelling, redness, odor or green/yellow discharge around incision site)     Complete by:  As directed      Call MD for:  severe uncontrolled pain    Complete by:  As directed      Call MD for:  temperature >100.4    Complete by:  As directed      Discharge instructions    Complete by:  As directed   Vaginal delivery, care after            Medication List         acetaminophen 500 MG tablet  Commonly known as:  TYLENOL  Take 1,000 mg by mouth every 6 (six) hours as needed for headache.     clindamycin 300 MG capsule  Commonly known as:  CLEOCIN  Take 1 capsule (300 mg total) by mouth every 12 (twelve) hours.     prenatal multivitamin Tabs tablet  Take 1 tablet by mouth at bedtime.         Patricia Richards ,MD OB Fellow 12/19/2013,8:43 AM
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
## 2013-12-19 NOTE — H&P (Signed)
Attestation of Attending Supervision of Fellow: Evaluation and management procedures were performed by the Fellow under my supervision and collaboration.  I have reviewed the Fellow's note and chart, and I agree with the management and plan.    

## 2013-12-19 NOTE — Progress Notes (Signed)
First rhogam given in office 10/09/13. Notified blood bank.

## 2013-12-19 NOTE — Lactation Note (Signed)
This note was copied from the chart of [place]. Lactation Consultation Note  Patient Name: Patricia Richards YSAYT'K Date: 12/19/2013  Baby 21 hours of life. Mom had indicated breast/formula on admission, but has decided to exclusively formula feed.   Maternal Data    Feeding    [facility] Score/Interventions                      Lactation Tools Discussed/Used     Consult Status      Patricia Richards 12/19/2013, 2:35 PM

## 2013-12-19 NOTE — Anesthesia Postprocedure Evaluation (Signed)
  Anesthesia Post-op Note  Patient: Patricia Richards  Procedure(s) Performed: * No procedures listed *  Patient Location: PACU and Mother/Baby  Anesthesia Type:Epidural  Level of Consciousness: awake, alert , oriented and patient cooperative  Airway and Oxygen Therapy: Patient Spontanous Breathing  Post-op Pain: none  Post-op Assessment: Post-op Vital signs reviewed, Patient's Cardiovascular Status Stable, Respiratory Function Stable, No signs of Nausea or vomiting, Adequate PO intake, Pain level controlled, No headache, No backache, No residual numbness and No residual motor weakness  Post-op Vital Signs: Reviewed and stable  Last Vitals:  Filed Vitals:   12/19/13 0542  BP: 101/67  Pulse: 74  Temp: 35.9 C  Resp: 18    Complications: No apparent anesthesia complications

## 2013-12-20 ENCOUNTER — Other Ambulatory Visit: Payer: Medicaid Other

## 2013-12-20 LAB — RH IG WORKUP (INCLUDES ABO/RH)
ABO/RH(D): A NEG
Antibody Screen: POSITIVE
DAT, IgG: NEGATIVE
Fetal Screen: NEGATIVE
GESTATIONAL AGE(WKS): 39.2
UNIT DIVISION: 0

## 2013-12-21 ENCOUNTER — Encounter: Payer: Self-pay | Admitting: Obstetrics & Gynecology

## 2013-12-21 ENCOUNTER — Encounter: Payer: Self-pay | Admitting: *Deleted

## 2013-12-28 ENCOUNTER — Encounter: Payer: Self-pay | Admitting: General Practice

## 2014-02-01 ENCOUNTER — Ambulatory Visit: Payer: Medicaid Other | Admitting: Obstetrics & Gynecology

## 2014-02-01 ENCOUNTER — Encounter: Payer: Self-pay | Admitting: Obstetrics & Gynecology

## 2014-03-07 ENCOUNTER — Encounter (HOSPITAL_COMMUNITY): Payer: Self-pay | Admitting: Emergency Medicine

## 2014-03-07 ENCOUNTER — Emergency Department (HOSPITAL_COMMUNITY)
Admission: EM | Admit: 2014-03-07 | Discharge: 2014-03-07 | Disposition: A | Discharge status: Home / Self Care | Payer: Medicaid Other | Attending: Emergency Medicine | Admitting: Emergency Medicine

## 2014-03-07 ENCOUNTER — Emergency Department (HOSPITAL_COMMUNITY): Payer: Medicaid Other

## 2014-03-07 DIAGNOSIS — R0600 Dyspnea, unspecified: Secondary | ICD-10-CM

## 2014-03-07 DIAGNOSIS — Z79899 Other long term (current) drug therapy: Secondary | ICD-10-CM | POA: Insufficient documentation

## 2014-03-07 DIAGNOSIS — R079 Chest pain, unspecified: Secondary | ICD-10-CM | POA: Insufficient documentation

## 2014-03-07 DIAGNOSIS — M542 Cervicalgia: Secondary | ICD-10-CM | POA: Diagnosis not present

## 2014-03-07 DIAGNOSIS — Z792 Long term (current) use of antibiotics: Secondary | ICD-10-CM | POA: Insufficient documentation

## 2014-03-07 DIAGNOSIS — R0602 Shortness of breath: Secondary | ICD-10-CM | POA: Diagnosis not present

## 2014-03-07 DIAGNOSIS — Z8619 Personal history of other infectious and parasitic diseases: Secondary | ICD-10-CM | POA: Insufficient documentation

## 2014-03-07 DIAGNOSIS — R51 Headache: Secondary | ICD-10-CM | POA: Insufficient documentation

## 2014-03-07 DIAGNOSIS — F410 Panic disorder [episodic paroxysmal anxiety] without agoraphobia: Secondary | ICD-10-CM | POA: Diagnosis not present

## 2014-03-07 DIAGNOSIS — R52 Pain, unspecified: Secondary | ICD-10-CM

## 2014-03-07 DIAGNOSIS — Z87440 Personal history of urinary (tract) infections: Secondary | ICD-10-CM | POA: Insufficient documentation

## 2014-03-07 DIAGNOSIS — Z862 Personal history of diseases of the blood and blood-forming organs and certain disorders involving the immune mechanism: Secondary | ICD-10-CM | POA: Insufficient documentation

## 2014-03-07 DIAGNOSIS — Z720 Tobacco use: Secondary | ICD-10-CM | POA: Insufficient documentation

## 2014-03-07 DIAGNOSIS — F419 Anxiety disorder, unspecified: Secondary | ICD-10-CM

## 2014-03-07 LAB — COMPREHENSIVE METABOLIC PANEL
ALBUMIN: 4.7 g/dL (ref 3.5–5.2)
ALT: 17 U/L (ref 0–35)
ANION GAP: 11 (ref 5–15)
AST: 17 U/L (ref 0–37)
Alkaline Phosphatase: 48 U/L (ref 39–117)
BUN: 11 mg/dL (ref 6–23)
CO2: 25 mEq/L (ref 19–32)
CREATININE: 0.81 mg/dL (ref 0.50–1.10)
Calcium: 10 mg/dL (ref 8.4–10.5)
Chloride: 103 mEq/L (ref 96–112)
GLUCOSE: 91 mg/dL (ref 70–99)
Potassium: 4.2 mEq/L (ref 3.7–5.3)
Sodium: 139 mEq/L (ref 137–147)
Total Bilirubin: 0.4 mg/dL (ref 0.3–1.2)
Total Protein: 8.1 g/dL (ref 6.0–8.3)

## 2014-03-07 LAB — CBC WITH DIFFERENTIAL/PLATELET
BASOS ABS: 0 10*3/uL (ref 0.0–0.1)
Basophils Relative: 0 % (ref 0–1)
EOS ABS: 0 10*3/uL (ref 0.0–0.7)
EOS PCT: 1 % (ref 0–5)
HCT: 37.5 % (ref 36.0–46.0)
Hemoglobin: 12.5 g/dL (ref 12.0–15.0)
LYMPHS PCT: 45 % (ref 12–46)
Lymphs Abs: 1.8 10*3/uL (ref 0.7–4.0)
MCH: 29.1 pg (ref 26.0–34.0)
MCHC: 33.3 g/dL (ref 30.0–36.0)
MCV: 87.2 fL (ref 78.0–100.0)
Monocytes Absolute: 0.2 10*3/uL (ref 0.1–1.0)
Monocytes Relative: 5 % (ref 3–12)
Neutro Abs: 2 10*3/uL (ref 1.7–7.7)
Neutrophils Relative %: 49 % (ref 43–77)
PLATELETS: 264 10*3/uL (ref 150–400)
RBC: 4.3 MIL/uL (ref 3.87–5.11)
RDW: 12.9 % (ref 11.5–15.5)
WBC: 4 10*3/uL (ref 4.0–10.5)

## 2014-03-07 LAB — D-DIMER, QUANTITATIVE (NOT AT ARMC): D DIMER QUANT: 1.44 ug{FEU}/mL — AB (ref 0.00–0.48)

## 2014-03-07 MED ORDER — HYDROGEN PEROXIDE 3 % EX SOLN
CUTANEOUS | Status: AC
  Administered 2014-03-07: 12:00:00
  Filled 2014-03-07: qty 473

## 2014-03-07 MED ORDER — IOHEXOL 350 MG/ML SOLN
100.0000 mL | Freq: Once | INTRAVENOUS | Status: AC | PRN
Start: 2014-03-07 — End: 2014-03-07
  Administered 2014-03-07: 100 mL via INTRAVENOUS

## 2014-03-07 MED ORDER — HYDROXYZINE HCL 25 MG PO TABS: ORAL_TABLET | ORAL | Status: DC

## 2014-03-07 NOTE — ED Notes (Signed)
MD at bedside. 

## 2014-03-07 NOTE — ED Provider Notes (Signed)
CSN: [id number]     Arrival date & time 03/07/14  1037 History  This chart was scribed for Maudry Diego, MD by Ludger Nutting, ED Scribe. This patient was seen in room APA15/APA15 and the patient's care was started 11:16 AM.    Chief Complaint  Patient presents with  . Chest Pain   Patient is a 24 y.o. female presenting with chest pain. The history is provided by the patient. No language interpreter was used.  Chest Pain Pain location:  L chest Pain severity:  Mild Onset quality:  Gradual Duration:  1 week Timing:  Intermittent Progression:  Unchanged Relieved by:  Nothing Worsened by:  Deep breathing Associated symptoms: anxiety, headache and shortness of breath   Associated symptoms: no abdominal pain, no back pain, no cough, no fatigue and no fever   Risk factors: birth control     HPI Comments: Patricia Richards is a 23 y.o. female who presents to the Emergency Department complaining of multiple complaints for the past 1 week. Patient reports having left sided chest pain, SOB, jaw pain, neck pain, and a HA. She reports her chest pain and SOB is usually present when feeling anxious or when around people who smoke. She states the pain is occasionally worse with breathing but not always. She was seen at [facility] last week and had a negative CXR. She is currently taking the Depo injection and is concerned for a possible PE. She denies fever, chills.   Past Medical History  Diagnosis Date  . Anemia   . Urinary tract infection     gets them frequently  . Pregnant   . Bacterial infection    Past Surgical History  Procedure Laterality Date  . Dilation and evacuation  11/28/2010   Family History  Problem Relation Age of Onset  . Hypertension Father   . Cancer Father   . Cancer Maternal Grandmother    History  Substance Use Topics  . Smoking status: Current Every Day Smoker    Types: Cigarettes  . Smokeless tobacco: Never Used  . Alcohol Use: No   OB History   Grav Para  Term Preterm Abortions TAB SAB Ect Mult Living   3 2 2  0 1 1 0 0 0 2     Review of Systems  Constitutional: Negative for fever, appetite change and fatigue.  HENT: Negative for congestion, ear discharge and sinus pressure.   Eyes: Negative for discharge.  Respiratory: Positive for shortness of breath. Negative for cough.   Cardiovascular: Positive for chest pain.  Gastrointestinal: Negative for abdominal pain and diarrhea.  Genitourinary: Negative for frequency and hematuria.  Musculoskeletal: Positive for neck pain. Negative for back pain.  Skin: Negative for rash.  Neurological: Positive for headaches. Negative for seizures.  Psychiatric/Behavioral: Negative for hallucinations.      Allergies  Metronidazole and Tramadol  Home Medications   Prior to Admission medications   Medication Sig Start Date End Date Taking? Authorizing Provider  acetaminophen (TYLENOL) 500 MG tablet Take 1,000 mg by mouth every 6 (six) hours as needed for headache.     Historical Provider, MD  clindamycin (CLEOCIN) 300 MG capsule Take 1 capsule (300 mg total) by mouth every 12 (twelve) hours. 11/26/13   Kassie Mends, MD  ibuprofen (MOTRIN IB) 200 MG tablet Take 1 tablet (200 mg total) by mouth every 6 (six) hours as needed. 12/19/13   Aquilla Hacker, MD  Prenatal Vit-Fe Fumarate-FA (PRENATAL MULTIVITAMIN) TABS tablet Take 1 tablet by  mouth at bedtime.     Historical Provider, MD   BP 134/92  Pulse 96  Temp(Src) 98.7 F (37.1 C) (Oral)  Resp 18  Ht 5\' 3"  (1.6 m)  Wt 110 lb (49.896 kg)  BMI 19.49 kg/m2  SpO2 100%  LMP 02/28/2014 Physical Exam  Nursing note and vitals reviewed. Constitutional: She is oriented to person, place, and time. She appears well-developed.  HENT:  Head: Normocephalic.  Eyes: Conjunctivae and EOM are normal. No scleral icterus.  Neck: Neck supple. No thyromegaly present.  Cardiovascular: Normal rate and regular rhythm.  Exam reveals no gallop and no friction rub.   No  murmur heard. Pulmonary/Chest: No stridor. She has no wheezes. She has no rales. She exhibits no tenderness.  Abdominal: She exhibits no distension. There is no tenderness. There is no rebound.  Musculoskeletal: Normal range of motion. She exhibits tenderness. She exhibits no edema.  Tenderness to left upper back   Lymphadenopathy:    She has no cervical adenopathy.  Neurological: She is oriented to person, place, and time. She exhibits normal muscle tone. Coordination normal.  Skin: No rash noted. No erythema.  Psychiatric: She has a normal mood and affect. Her behavior is normal.    ED Course  Procedures (including critical care time)  DIAGNOSTIC STUDIES: Oxygen Saturation is 100% on RA, normal by my interpretation.    COORDINATION OF CARE: 11:19 AM Discussed treatment plan with pt at bedside and pt agreed to plan.   Labs Review Labs Reviewed - No data to display  Imaging Review No results found.   EKG Interpretation None      MDM   Final diagnoses:  None  panic attack.   tx with atarax   The chart was scribed for me under my direct supervision.  I personally performed the history, physical, and medical decision making and all procedures in the evaluation of this patient.Maudry Diego, MD 03/07/14 [phone]

## 2014-03-07 NOTE — ED Notes (Signed)
Chest pain, anxiety,sob,  back pain, headaches x 1 wk.  Also wants to be checked for blood clots, reports has been having blood clots with vaginal bleeding.

## 2014-03-07 NOTE — Discharge Instructions (Signed)
Follow up with your md as needed °

## 2014-03-07 NOTE — ED Notes (Signed)
PT c/o left sided pain all the way down her body with chest tightness and increased anxiety since the birth of her 34month son.

## 2014-03-12 ENCOUNTER — Encounter: Payer: Self-pay | Admitting: General Practice

## 2014-03-18 ENCOUNTER — Encounter (HOSPITAL_COMMUNITY): Payer: Self-pay | Admitting: Emergency Medicine

## 2014-04-11 ENCOUNTER — Emergency Department (HOSPITAL_COMMUNITY)
Admission: EM | Admit: 2014-04-11 | Discharge: 2014-04-11 | Disposition: A | Discharge status: Home / Self Care | Payer: Medicaid Other | Attending: Emergency Medicine | Admitting: Emergency Medicine

## 2014-04-11 ENCOUNTER — Encounter (HOSPITAL_COMMUNITY): Payer: Self-pay | Admitting: Emergency Medicine

## 2014-04-11 DIAGNOSIS — Z862 Personal history of diseases of the blood and blood-forming organs and certain disorders involving the immune mechanism: Secondary | ICD-10-CM | POA: Diagnosis not present

## 2014-04-11 DIAGNOSIS — Z87440 Personal history of urinary (tract) infections: Secondary | ICD-10-CM | POA: Insufficient documentation

## 2014-04-11 DIAGNOSIS — B349 Viral infection, unspecified: Secondary | ICD-10-CM | POA: Diagnosis not present

## 2014-04-11 DIAGNOSIS — R509 Fever, unspecified: Secondary | ICD-10-CM | POA: Diagnosis present

## 2014-04-11 DIAGNOSIS — Z720 Tobacco use: Secondary | ICD-10-CM | POA: Insufficient documentation

## 2014-04-11 MED ORDER — ACETAMINOPHEN 500 MG PO TABS
1000.0000 mg | ORAL_TABLET | Freq: Once | ORAL | Status: AC
  Administered 2014-04-11: 1000 mg via ORAL
  Filled 2014-04-11: qty 2

## 2014-04-11 NOTE — ED Notes (Signed)
Pt. reports body aches , fever , chills , headache and nausea onset today .

## 2014-04-11 NOTE — ED Provider Notes (Signed)
CSN: [id number]     Arrival date & time 04/11/14  2222 History   First MD Initiated Contact with Patient 04/11/14 2251     Chief Complaint  Patient presents with  . Generalized Body Aches  . Fever  . Chills     (Consider location/radiation/quality/duration/timing/severity/associated sxs/prior Treatment) HPI Comments: Patient presents to the emergency department with chief complaints of generalized body aches, fevers, chills, headache, nausea. She states that the symptoms started this morning. She denies any sick contacts. She states that she did not get a flu shot. She denies any chest pain, shortness of breath, vomiting, diarrhea, constipation, new vaginal discharge, or dysuria. She has tried taking ibuprofen with some relief. There are no aggravating or alleviating factors.  The history is provided by the patient. No language interpreter was used.    Past Medical History  Diagnosis Date  . Anemia   . Urinary tract infection     gets them frequently  . Pregnant   . Bacterial infection    Past Surgical History  Procedure Laterality Date  . Dilation and evacuation  11/28/2010   Family History  Problem Relation Age of Onset  . Hypertension Father   . Cancer Father   . Cancer Maternal Grandmother    History  Substance Use Topics  . Smoking status: Current Every Day Smoker    Types: Cigarettes  . Smokeless tobacco: Never Used  . Alcohol Use: No   OB History    Gravida Para Term Preterm AB TAB SAB Ectopic Multiple Living   3 2 2  0 1 1 0 0 0 2     Review of Systems  Constitutional: Positive for fever and chills.  HENT: Negative for postnasal drip, rhinorrhea, sinus pressure, sneezing and sore throat.   Respiratory: Negative for cough and shortness of breath.   Cardiovascular: Negative for chest pain.  Gastrointestinal: Negative for nausea, vomiting, abdominal pain, diarrhea and constipation.  Genitourinary: Negative for dysuria.  Neurological: Positive for headaches.   All other systems reviewed and are negative.     Allergies  Metronidazole and Tramadol  Home Medications   Prior to Admission medications   Medication Sig Start Date End Date Taking? Authorizing Provider  acetaminophen (TYLENOL) 500 MG tablet Take 1,500 mg by mouth every 6 (six) hours as needed for moderate pain or headache.     Historical Provider, MD  hydrOXYzine (ATARAX/VISTARIL) 25 MG tablet Take one every 12 hours as needed for panic attacks 03/07/14   Maudry Diego, MD  medroxyPROGESTERone (DEPO-PROVERA) 150 MG/ML injection Inject 150 mg into the muscle every 3 (three) months.    Historical Provider, MD   BP 105/71 mmHg  Pulse 108  Temp(Src) 99.3 F (37.4 C) (Oral)  Resp 14  Ht 5\' 3"  (1.6 m)  Wt 105 lb (47.628 kg)  BMI 18.60 kg/m2  SpO2 99% Physical Exam  Constitutional: She is oriented to person, place, and time. She appears well-developed and well-nourished.  Well appearing  HENT:  Head: Normocephalic and atraumatic.  Eyes: Conjunctivae and EOM are normal. Pupils are equal, round, and reactive to light.  Neck: Normal range of motion. Neck supple.  Cardiovascular: Normal rate and regular rhythm.  Exam reveals no gallop and no friction rub.   No murmur heard. Normal rate on my exam  Pulmonary/Chest: Effort normal and breath sounds normal. No respiratory distress. She has no wheezes. She has no rales. She exhibits no tenderness.  Clear to auscultation  Abdominal: Soft. Bowel sounds are normal. She  exhibits no distension and no mass. There is no tenderness. There is no rebound and no guarding.  No focal abdominal tenderness, no RLQ tenderness or pain at McBurney's point, no RUQ tenderness or Murphy's sign, no left-sided abdominal tenderness, no fluid wave, or signs of peritonitis   Musculoskeletal: Normal range of motion. She exhibits no edema or tenderness.  Neurological: She is alert and oriented to person, place, and time.  Skin: Skin is warm and dry.   Psychiatric: She has a normal mood and affect. Her behavior is normal. Judgment and thought content normal.  Nursing note and vitals reviewed.   ED Course  Procedures (including critical care time) Labs Review Labs Reviewed - No data to display  Imaging Review No results found.   EKG Interpretation None      MDM   Final diagnoses:  Viral syndrome    Patient with viral syndrome, possibly influenza. Patient has had intermittent fevers and chills today as well as generalized body aches, and some nausea. She has taken ibuprofen with some relief. She denies any chest pain or abdominal pain. She is not in any apparent distress. Nontoxic-appearing. Patient tells me that she is a hypochondriac, and is concerned about toxic shock syndrome. We discussed toxic shock syndrome together, the patient was given reassurance this is not TSS, but is rather viral syndrome and possibly influenza. Patient is stable and ready for discharge. Recommend treatment with Tylenol, ibuprofen, and hydration.    Montine Circle, PA-C 04/11/14 7622  Tanna Furry, MD 04/16/14 [phone]

## 2014-04-11 NOTE — Discharge Instructions (Signed)

## 2014-06-02 ENCOUNTER — Encounter (HOSPITAL_COMMUNITY): Payer: Self-pay | Admitting: *Deleted

## 2014-06-02 ENCOUNTER — Emergency Department (HOSPITAL_COMMUNITY): Payer: Medicaid Other

## 2014-06-02 ENCOUNTER — Emergency Department (HOSPITAL_COMMUNITY)
Admission: EM | Admit: 2014-06-02 | Discharge: 2014-06-02 | Disposition: A | Discharge status: Home / Self Care | Payer: Medicaid Other | Attending: Emergency Medicine | Admitting: Emergency Medicine

## 2014-06-02 DIAGNOSIS — R000 Tachycardia, unspecified: Secondary | ICD-10-CM | POA: Diagnosis not present

## 2014-06-02 DIAGNOSIS — J029 Acute pharyngitis, unspecified: Secondary | ICD-10-CM

## 2014-06-02 DIAGNOSIS — Z862 Personal history of diseases of the blood and blood-forming organs and certain disorders involving the immune mechanism: Secondary | ICD-10-CM | POA: Diagnosis not present

## 2014-06-02 DIAGNOSIS — R509 Fever, unspecified: Secondary | ICD-10-CM

## 2014-06-02 DIAGNOSIS — Z87891 Personal history of nicotine dependence: Secondary | ICD-10-CM | POA: Insufficient documentation

## 2014-06-02 DIAGNOSIS — Z3202 Encounter for pregnancy test, result negative: Secondary | ICD-10-CM | POA: Insufficient documentation

## 2014-06-02 DIAGNOSIS — J111 Influenza due to unidentified influenza virus with other respiratory manifestations: Secondary | ICD-10-CM | POA: Diagnosis present

## 2014-06-02 DIAGNOSIS — Z87440 Personal history of urinary (tract) infections: Secondary | ICD-10-CM | POA: Diagnosis not present

## 2014-06-02 DIAGNOSIS — Z8619 Personal history of other infectious and parasitic diseases: Secondary | ICD-10-CM | POA: Insufficient documentation

## 2014-06-02 LAB — URINALYSIS, ROUTINE W REFLEX MICROSCOPIC
Glucose, UA: NEGATIVE mg/dL
Ketones, ur: 40 mg/dL — AB
Leukocytes, UA: NEGATIVE
Nitrite: NEGATIVE
SPECIFIC GRAVITY, URINE: 1.015 (ref 1.005–1.030)
UROBILINOGEN UA: 0.2 mg/dL (ref 0.0–1.0)
pH: 6.5 (ref 5.0–8.0)

## 2014-06-02 LAB — BASIC METABOLIC PANEL
ANION GAP: 6 (ref 5–15)
BUN: 8 mg/dL (ref 6–23)
CO2: 26 mmol/L (ref 19–32)
Calcium: 9.2 mg/dL (ref 8.4–10.5)
Chloride: 100 mEq/L (ref 96–112)
Creatinine, Ser: 0.64 mg/dL (ref 0.50–1.10)
GFR calc Af Amer: >90 mL/min (ref >90)
GFR calc non Af Amer: >90 mL/min (ref >90)
Glucose, Bld: 110 mg/dL — ABNORMAL HIGH (ref 70–99)
Potassium: 3.1 mmol/L — ABNORMAL LOW (ref 3.5–5.1)
Sodium: 132 mmol/L — ABNORMAL LOW (ref 135–145)

## 2014-06-02 LAB — INFLUENZA PANEL BY PCR (TYPE A & B)
H1N1 flu by pcr: NOT DETECTED
Influenza A By PCR: NEGATIVE
Influenza B By PCR: NEGATIVE

## 2014-06-02 LAB — CBC WITH DIFFERENTIAL/PLATELET
BASOS ABS: 0 10*3/uL (ref 0.0–0.1)
Basophils Relative: 0 % (ref 0–1)
Eosinophils Absolute: 0 10*3/uL (ref 0.0–0.7)
Eosinophils Relative: 0 % (ref 0–5)
HCT: 32.3 % — ABNORMAL LOW (ref 36.0–46.0)
Hemoglobin: 10.8 g/dL — ABNORMAL LOW (ref 12.0–15.0)
LYMPHS PCT: 11 % — AB (ref 12–46)
Lymphs Abs: 1.2 10*3/uL (ref 0.7–4.0)
MCH: 30.3 pg (ref 26.0–34.0)
MCHC: 33.4 g/dL (ref 30.0–36.0)
MCV: 90.5 fL (ref 78.0–100.0)
MONOS PCT: 6 % (ref 3–12)
Monocytes Absolute: 0.7 10*3/uL (ref 0.1–1.0)
Neutro Abs: 9.1 10*3/uL — ABNORMAL HIGH (ref 1.7–7.7)
Neutrophils Relative %: 83 % — ABNORMAL HIGH (ref 43–77)
Platelets: 166 10*3/uL (ref 150–400)
RBC: 3.57 MIL/uL — ABNORMAL LOW (ref 3.87–5.11)
RDW: 12.9 % (ref 11.5–15.5)
WBC: 11 10*3/uL — ABNORMAL HIGH (ref 4.0–10.5)

## 2014-06-02 LAB — URINE MICROSCOPIC-ADD ON

## 2014-06-02 LAB — RAPID STREP SCREEN (MED CTR MEBANE ONLY): STREPTOCOCCUS, GROUP A SCREEN (DIRECT): NEGATIVE

## 2014-06-02 LAB — PREGNANCY, URINE: Preg Test, Ur: NEGATIVE

## 2014-06-02 MED ORDER — SODIUM CHLORIDE 0.9 % IV BOLUS (SEPSIS): 1000.0000 mL | Freq: Once | INTRAVENOUS | Status: DC

## 2014-06-02 MED ORDER — POTASSIUM CHLORIDE CRYS ER 20 MEQ PO TBCR
40.0000 meq | EXTENDED_RELEASE_TABLET | Freq: Once | ORAL | Status: AC
  Administered 2014-06-02: 40 meq via ORAL
  Filled 2014-06-02: qty 2

## 2014-06-02 MED ORDER — PENICILLIN G BENZATHINE 1200000 UNIT/2ML IM SUSP
1.2000 10*6.[IU] | Freq: Once | INTRAMUSCULAR | Status: AC
  Administered 2014-06-02: 1.2 10*6.[IU] via INTRAMUSCULAR
  Filled 2014-06-02: qty 2

## 2014-06-02 NOTE — Discharge Instructions (Signed)
Pharyngitis Keep yourself hydrated.  Establish care with a primary doctor. Return to the ED if you develop difficulty breathing, difficulty swallowing, or any other concerns. Pharyngitis is redness, pain, and swelling (inflammation) of your pharynx.  CAUSES  Pharyngitis is usually caused by infection. Most of the time, these infections are from viruses (viral) and are part of a cold. However, sometimes pharyngitis is caused by bacteria (bacterial). Pharyngitis can also be caused by allergies. Viral pharyngitis may be spread from person to person by coughing, sneezing, and personal items or utensils (cups, forks, spoons, toothbrushes). Bacterial pharyngitis may be spread from person to person by more intimate contact, such as kissing.  SIGNS AND SYMPTOMS  Symptoms of pharyngitis include:   Sore throat.   Tiredness (fatigue).   Low-grade fever.   Headache.  Joint pain and muscle aches.  Skin rashes.  Swollen lymph nodes.  Plaque-like film on throat or tonsils (often seen with bacterial pharyngitis). DIAGNOSIS  Your health care provider will ask you questions about your illness and your symptoms. Your medical history, along with a physical exam, is often all that is needed to diagnose pharyngitis. Sometimes, a rapid strep test is done. Other lab tests may also be done, depending on the suspected cause.  TREATMENT  Viral pharyngitis will usually get better in 3-4 days without the use of medicine. Bacterial pharyngitis is treated with medicines that kill germs (antibiotics).  HOME CARE INSTRUCTIONS   Drink enough water and fluids to keep your urine clear or pale yellow.   Only take over-the-counter or prescription medicines as directed by your health care provider:   If you are prescribed antibiotics, make sure you finish them even if you start to feel better.   Do not take aspirin.   Get lots of rest.   Gargle with 8 oz of salt water ( tsp of salt per 1 qt of water) as  often as every 1-2 hours to soothe your throat.   Throat lozenges (if you are not at risk for choking) or sprays may be used to soothe your throat. SEEK MEDICAL CARE IF:   You have large, tender lumps in your neck.  You have a rash.  You cough up green, yellow-brown, or bloody spit. SEEK IMMEDIATE MEDICAL CARE IF:   Your neck becomes stiff.  You drool or are unable to swallow liquids.  You vomit or are unable to keep medicines or liquids down.  You have severe pain that does not go away with the use of recommended medicines.  You have trouble breathing (not caused by a stuffy nose). MAKE SURE YOU:   Understand these instructions.  Will watch your condition.  Will get help right away if you are not doing well or get worse. Document Released: 05/03/2005 Document Revised: 02/21/2013 Document Reviewed: 01/08/2013 [facility] Patient Information 2015 [place], [place]. This information is not intended to replace advice given to you by your health care provider. Make sure you discuss any questions you have with your health care provider.

## 2014-06-02 NOTE — ED Notes (Signed)
Pt reports she is tolerating Gatorade and ginger ale po.

## 2014-06-02 NOTE — ED Provider Notes (Signed)
CSN: [id number]     Arrival date & time 06/02/14  1318 History  This chart was scribed for Patricia Essex, MD by Zola Button, ED Scribe. This patient was seen in room APA04/APA04 and the patient's care was started at 2:34 PM.       Chief Complaint  Patient presents with  . Influenza   The history is provided by the patient. No language interpreter was used.   HPI Comments: Patricia Richards is a 24 y.o. female who presents to the Emergency Department complaining of gradual onset URI symptoms that started yesterday. Patient reports having sore throat, HA, dizziness, difficulty swallowing, general myalgias, fatigue, nausea, chills, productive cough of yellow and green sputum, pinching left-sided neck pain. Her symptoms started with the myalgias. She states her sore throat and dizziness are the worst of her symptoms. Patient denies vomiting and abdominal pain. Patient reports having sick contacts at home. She states she does not take any medications normally. Her LNMP was sometime last month, but she does not remember what date. She states she may have a heart issue as her heart sometimes races. She reports allergies to Tramadol and Flagyl.   No PCP per patient  Past Medical History  Diagnosis Date  . Anemia   . Urinary tract infection     gets them frequently  . Pregnant   . Bacterial infection    Past Surgical History  Procedure Laterality Date  . Dilation and evacuation  11/28/2010   Family History  Problem Relation Age of Onset  . Hypertension Father   . Cancer Father   . Cancer Maternal Grandmother    History  Substance Use Topics  . Smoking status: Former Smoker    Quit date: 02/14/2014  . Smokeless tobacco: Never Used  . Alcohol Use: No   OB History    Gravida Para Term Preterm AB TAB SAB Ectopic Multiple Living   3 2 2  0 1 1 0 0 0 2     Review of Systems A complete 10 system review of systems was obtained and all systems are negative except as noted in the HPI and PMH.     Allergies  Metronidazole and Tramadol  Home Medications   Prior to Admission medications   Medication Sig Start Date End Date Taking? Authorizing Provider  acetaminophen (TYLENOL) 500 MG tablet Take 1,500 mg by mouth every 6 (six) hours as needed for moderate pain or headache.    Yes Historical Provider, MD  hydrOXYzine (ATARAX/VISTARIL) 25 MG tablet Take one every 12 hours as needed for panic attacks 03/07/14  Yes Maudry Diego, MD   BP 105/75 mmHg  Pulse 109  Temp(Src) 98.8 F (37.1 C) (Oral)  Resp 16  Ht 5\' 3"  (1.6 m)  Wt 105 lb (47.628 kg)  BMI 18.60 kg/m2  SpO2 100%  LMP  Physical Exam  Constitutional: She is oriented to person, place, and time. She appears well-developed and well-nourished. No distress.  HENT:  Head: Normocephalic and atraumatic.  Mouth/Throat: Oropharynx is clear and moist.  Tonsillar exudate bilaterally. No asymmetry.  Eyes: Conjunctivae and EOM are normal. Pupils are equal, round, and reactive to light.  Neck: Normal range of motion. Neck supple.  No meningismus.  Cardiovascular: Regular rhythm and intact distal pulses.  Tachycardia present.   No murmur heard. Tachycardic.  Pulmonary/Chest: Effort normal and breath sounds normal. No respiratory distress.  Abdominal: Soft. There is no tenderness. There is no rebound and no guarding.  Musculoskeletal: Normal range of  motion. She exhibits no edema or tenderness.  Neurological: She is alert and oriented to person, place, and time. No cranial nerve deficit. She exhibits normal muscle tone. Coordination normal.  No ataxia on finger to nose bilaterally. No pronator drift. 5/5 strength throughout. CN 2-12 intact. Negative Romberg. Equal grip strength. Sensation intact. Gait is normal.   Skin: Skin is warm.  Psychiatric: She has a normal mood and affect. Her behavior is normal.  Nursing note and vitals reviewed.   ED Course  Procedures  DIAGNOSTIC STUDIES: Oxygen Saturation is 99% on room air,  normal by my interpretation.    COORDINATION OF CARE: 3:24 PM-Discussed treatment plan which includes CXR and labs with pt at bedside and pt agreed to plan.    Labs Review Labs Reviewed  URINALYSIS, ROUTINE W REFLEX MICROSCOPIC - Abnormal; Notable for the following:    Hgb urine dipstick TRACE (*)    Bilirubin Urine SMALL (*)    Ketones, ur 40 (*)    Protein, ur TRACE (*)    All other components within normal limits  CBC WITH DIFFERENTIAL - Abnormal; Notable for the following:    WBC 11.0 (*)    RBC 3.57 (*)    Hemoglobin 10.8 (*)    HCT 32.3 (*)    Neutrophils Relative % 83 (*)    Neutro Abs 9.1 (*)    Lymphocytes Relative 11 (*)    All other components within normal limits  BASIC METABOLIC PANEL - Abnormal; Notable for the following:    Sodium 132 (*)    Potassium 3.1 (*)    Glucose, Bld 110 (*)    All other components within normal limits  URINE MICROSCOPIC-ADD ON - Abnormal; Notable for the following:    Squamous Epithelial / LPF MANY (*)    All other components within normal limits  RAPID STREP SCREEN  CULTURE, GROUP A STREP  PREGNANCY, URINE  INFLUENZA PANEL BY PCR (TYPE A & B, H1N1)    Imaging Review Dg Chest 2 View  06/02/2014   CLINICAL DATA:  Fever, sinus congestion, sore throat, former smoker  EXAM: CHEST  2 VIEW  COMPARISON:  None  FINDINGS: Normal heart size, mediastinal contours, and pulmonary vascularity.  Dextro convex thoracic scoliosis.  Minimal peribronchial thickening and hyperinflation.  No infiltrate, pleural effusion or pneumothorax.  No acute osseous findings.  IMPRESSION: Minimal bronchitic changes and slight hyperinflation without infiltrate.   Electronically Signed   By: Lavonia Dana M.D.   On: 06/02/2014 16:06     EKG Interpretation   Date/Time:  Sunday June 02 2014 17:56:37 EST Ventricular Rate:  105 PR Interval:  146 QRS Duration: 85 QT Interval:  324 QTC Calculation: 428 R Axis:   90 Text Interpretation:  Sinus tachycardia Consider  right ventricular  hypertrophy No previous ECGs available Confirmed by Amillion Scobee  MD, Aurelius Gildersleeve  [phone]) on 06/02/2014 5:59:41 PM      MDM   Final diagnoses:  Fever  Pharyngitis   2 day history of body aches, sore throat, fatigue, headache, chills, nausea and cough.  Tachycardic. She is afebrile. Bilateral tonsillar exudates without asymmetry. Empiric treatment for strep. No chest pain or shortness of breath.  Patient refusing IV fluids. Patient tolerating by mouth fluids well. Heart rate improved to 105. Blood pressure stable. She is in no distress and playing on her phone. Suspect viral syndrome causing symptoms but we'll treat empirically for possible strep. Needs to establish care with PCP. Return precautions discussed.  Patricia Essex, MD  06/02/14 2307 

## 2014-06-02 NOTE — ED Notes (Signed)
Pt refused IV fluids. States she is drinking Gatorade and will continue to do so.

## 2014-06-02 NOTE — ED Notes (Signed)
Pt states sore throat, bodayches, fatigue, pinching pain to left side of neck, nausea, chills, productive cough, yellow and green in color.

## 2014-06-04 LAB — CULTURE, GROUP A STREP

## 2014-10-04 ENCOUNTER — Emergency Department (HOSPITAL_COMMUNITY)
Admission: EM | Admit: 2014-10-04 | Discharge: 2014-10-04 | Disposition: A | Discharge status: Home / Self Care | Payer: Medicaid Other | Attending: Emergency Medicine | Admitting: Emergency Medicine

## 2014-10-04 ENCOUNTER — Emergency Department (HOSPITAL_COMMUNITY): Payer: Medicaid Other

## 2014-10-04 ENCOUNTER — Encounter (HOSPITAL_COMMUNITY): Payer: Self-pay | Admitting: Emergency Medicine

## 2014-10-04 DIAGNOSIS — Z8619 Personal history of other infectious and parasitic diseases: Secondary | ICD-10-CM | POA: Insufficient documentation

## 2014-10-04 DIAGNOSIS — Z87891 Personal history of nicotine dependence: Secondary | ICD-10-CM | POA: Diagnosis not present

## 2014-10-04 DIAGNOSIS — Z862 Personal history of diseases of the blood and blood-forming organs and certain disorders involving the immune mechanism: Secondary | ICD-10-CM | POA: Insufficient documentation

## 2014-10-04 DIAGNOSIS — R002 Palpitations: Secondary | ICD-10-CM | POA: Diagnosis present

## 2014-10-04 DIAGNOSIS — Z87440 Personal history of urinary (tract) infections: Secondary | ICD-10-CM | POA: Insufficient documentation

## 2014-10-04 LAB — COMPREHENSIVE METABOLIC PANEL
ALT: 17 U/L (ref 14–54)
AST: 19 U/L (ref 15–41)
Albumin: 4.1 g/dL (ref 3.5–5.0)
Alkaline Phosphatase: 42 U/L (ref 38–126)
Anion gap: 7 (ref 5–15)
BUN: 15 mg/dL (ref 6–20)
CALCIUM: 9.2 mg/dL (ref 8.9–10.3)
CO2: 27 mmol/L (ref 22–32)
CREATININE: 0.85 mg/dL (ref 0.44–1.00)
Chloride: 105 mmol/L (ref 101–111)
GFR calc non Af Amer: >60 mL/min (ref >60)
GLUCOSE: 82 mg/dL (ref 65–99)
Potassium: 3.6 mmol/L (ref 3.5–5.1)
Sodium: 139 mmol/L (ref 135–145)
Total Bilirubin: 0.5 mg/dL (ref 0.3–1.2)
Total Protein: 7.9 g/dL (ref 6.5–8.1)

## 2014-10-04 LAB — CBC WITH DIFFERENTIAL/PLATELET
Basophils Absolute: 0 10*3/uL (ref 0.0–0.1)
Basophils Relative: 0 % (ref 0–1)
EOS ABS: 0.1 10*3/uL (ref 0.0–0.7)
EOS PCT: 1 % (ref 0–5)
HEMATOCRIT: 38.2 % (ref 36.0–46.0)
HEMOGLOBIN: 12.4 g/dL (ref 12.0–15.0)
LYMPHS PCT: 17 % (ref 12–46)
Lymphs Abs: 1.9 10*3/uL (ref 0.7–4.0)
MCH: 28.8 pg (ref 26.0–34.0)
MCHC: 32.5 g/dL (ref 30.0–36.0)
MCV: 88.8 fL (ref 78.0–100.0)
MONO ABS: 0.4 10*3/uL (ref 0.1–1.0)
MONOS PCT: 3 % (ref 3–12)
Neutro Abs: 8.5 10*3/uL — ABNORMAL HIGH (ref 1.7–7.7)
Neutrophils Relative %: 79 % — ABNORMAL HIGH (ref 43–77)
PLATELETS: 288 10*3/uL (ref 150–400)
RBC: 4.3 MIL/uL (ref 3.87–5.11)
RDW: 12.9 % (ref 11.5–15.5)
WBC: 10.8 10*3/uL — ABNORMAL HIGH (ref 4.0–10.5)

## 2014-10-04 LAB — ETHANOL: Alcohol, Ethyl (B): 5 mg/dL — ABNORMAL HIGH (ref <5)

## 2014-10-04 LAB — MAGNESIUM: Magnesium: 2 mg/dL (ref 1.7–2.4)

## 2014-10-04 MED ORDER — SODIUM CHLORIDE 0.9 % IV SOLN
1000.0000 mL | INTRAVENOUS | Status: DC
  Administered 2014-10-04: 1000 mL via INTRAVENOUS

## 2014-10-04 MED ORDER — SODIUM CHLORIDE 0.9 % IV SOLN
1000.0000 mL | Freq: Once | INTRAVENOUS | Status: AC
  Administered 2014-10-04: 1000 mL via INTRAVENOUS

## 2014-10-04 NOTE — Discharge Instructions (Signed)
Palpitations  A palpitation is the feeling that your heartbeat is irregular. It may feel like your heart is fluttering or skipping a beat. It may also feel like your heart is beating faster than normal. This is usually not a serious problem. In some cases, you may need more medical tests.  HOME CARE  · Avoid:  ¨ Caffeine in coffee, tea, soft drinks, diet pills, and energy drinks.  ¨ Chocolate.  ¨ Alcohol.  · Stop smoking if you smoke.  · Reduce your stress and anxiety. Try:  ¨ A method that measures bodily functions so you can learn to control them (biofeedback).  ¨ Yoga.  ¨ Meditation.  ¨ Physical activity such as swimming, jogging, or walking.  · Get plenty of rest and sleep.  GET HELP IF:  · Your fast or irregular heartbeat continues after 24 hours.  · Your palpitations occur more often.  GET HELP RIGHT AWAY IF:   · You have chest pain.  · You feel short of breath.  · You have a very bad headache.  · You feel dizzy or pass out (faint).  MAKE SURE YOU:   · Understand these instructions.  · Will watch your condition.  · Will get help right away if you are not doing well or get worse.  Document Released: 02/10/2008 Document Revised: 09/17/2013 Document Reviewed: 07/02/2011  ExitCare® Patient Information ©2015 [employer]. This information is not intended to replace advice given to you by your health care provider. Make sure you discuss any questions you have with your health care provider.

## 2014-10-04 NOTE — ED Provider Notes (Signed)
CSN: [id number]     Arrival date & time 10/04/14  2002 History  This chart was scribed for Dorie Rank, MD by Irene Pap, ED Scribe. This patient was seen in room APA11/APA11 and patient care was started at 8:29 PM.   Chief Complaint  Patient presents with  . Palpitations   The history is provided by the patient. No language interpreter was used.    HPI Comments: Patricia Richards is a 24 y.o. female who presents to the Emergency Department complaining of palpitations onset earlier today. She states that she has a normally high heart rate. She states that she was drinking a lot of alcohol last night and woke up feeling nauseous. She states that she tried to make herself throw up, then began to feel her heartbeat racing. She reports associated body shakes and pain in the chest. She states that she was only able to throw up Sprite this morning. She reports pain with movement and laying on her side. She states that she drinks almost every weekend. She denies fever or any current nausea.   Past Medical History  Diagnosis Date  . Anemia   . Urinary tract infection     gets them frequently  . Pregnant   . Bacterial infection    Past Surgical History  Procedure Laterality Date  . Dilation and evacuation  11/28/2010   Family History  Problem Relation Age of Onset  . Hypertension Father   . Cancer Father   . Cancer Maternal Grandmother    History  Substance Use Topics  . Smoking status: Former Smoker    Quit date: 02/14/2014  . Smokeless tobacco: Never Used  . Alcohol Use: No   OB History    Gravida Para Term Preterm AB TAB SAB Ectopic Multiple Living   3 2 2  0 1 1 0 0 0 2     Review of Systems  All other systems reviewed and are negative.     Allergies  Metronidazole and Tramadol  Home Medications   Prior to Admission medications   Medication Sig Start Date End Date Taking? Authorizing Provider  acetaminophen (TYLENOL) 500 MG tablet Take 1,500 mg by mouth every 6 (six)  hours as needed for moderate pain or headache.     Historical Provider, MD  hydrOXYzine (ATARAX/VISTARIL) 25 MG tablet Take one every 12 hours as needed for panic attacks 03/07/14   Milton Ferguson, MD   BP 118/74 mmHg  Pulse 101  Temp(Src) 97.9 F (36.6 C) (Oral)  Resp 20  Ht 5\' 3"  (1.6 m)  Wt 105 lb (47.628 kg)  BMI 18.60 kg/m2  SpO2 100%  LMP 09/15/2014 Physical Exam  Constitutional: She appears well-developed and well-nourished. No distress.  HENT:  Head: Normocephalic and atraumatic.  Right Ear: External ear normal.  Left Ear: External ear normal.  Eyes: Conjunctivae are normal. Right eye exhibits no discharge. Left eye exhibits no discharge. No scleral icterus.  Neck: Neck supple. No tracheal deviation present.  Cardiovascular: Normal rate, regular rhythm and intact distal pulses.   Pulmonary/Chest: Effort normal and breath sounds normal. No stridor. No respiratory distress. She has no wheezes. She has no rales.  Abdominal: Soft. Bowel sounds are normal. She exhibits no distension. There is no tenderness. There is no rebound and no guarding.  Musculoskeletal: She exhibits no edema or tenderness.  Neurological: She is alert. She has normal strength. No cranial nerve deficit (no facial droop, extraocular movements intact, no slurred speech) or sensory deficit. She exhibits  normal muscle tone. She displays no seizure activity. Coordination normal.  Skin: Skin is warm and dry. No rash noted.  Psychiatric: She has a normal mood and affect.  Nursing note and vitals reviewed.   ED Course  Procedures (including critical care time) DIAGNOSTIC STUDIES: Oxygen Saturation is 100% on room air, normal by my interpretation.    COORDINATION OF CARE: 8:32 PM-Discussed treatment plan which includes labs and EKG with pt at bedside and pt agreed to plan.   Labs Review Labs Reviewed  CBC WITH DIFFERENTIAL/PLATELET - Abnormal; Notable for the following:    WBC 10.8 (*)    Neutrophils  Relative % 79 (*)    Neutro Abs 8.5 (*)    All other components within normal limits  ETHANOL - Abnormal; Notable for the following:    Alcohol, Ethyl (B) 5 (*)    All other components within normal limits  COMPREHENSIVE METABOLIC PANEL  MAGNESIUM   Imaging Review Dg Chest 2 View  10/04/2014   CLINICAL DATA:  Acute onset of palpitations and tachycardia. Initial encounter.  EXAM: CHEST  2 VIEW  COMPARISON:  Chest radiograph performed 06/02/2014  FINDINGS: The lungs are well-aerated and clear. There is no evidence of focal opacification, pleural effusion or pneumothorax.  The heart is normal in size; the mediastinal contour is within normal limits. No acute osseous abnormalities are seen.  IMPRESSION: No acute cardiopulmonary process seen.   Electronically Signed   By: Garald Balding M.D.   On: 10/04/2014 21:30     EKG Interpretation   Date/Time:  Friday Oct 04 2014 20:19:07 EDT Ventricular Rate:  106 PR Interval:  160 QRS Duration: 84 QT Interval:  348 QTC Calculation: 462 R Axis:   95 Text Interpretation:  Sinus tachycardia Borderline right axis deviation No  significant change since last tracing Confirmed by Shantal Roan  MD-J, Kendric Sindelar  ([id number]) on 10/04/2014 8:38:57 PM     Medications  0.9 %  sodium chloride infusion (1,000 mLs Intravenous New Bag/Given 10/04/14 2046)    Followed by  0.9 %  sodium chloride infusion (0 mLs Intravenous Stopped 10/04/14 2147)    MDM   Final diagnoses:  Heart palpitations   Doubt withdrawal.  Doubt PE or myocarditis.  Pt has normal labs.  Sinus rhythm on the EKG.  Encouraged to avoid caffeine, alcohol useany stimulants.  Drink fluids.  Follow up with PCP  Dorie Rank, MD 10/04/14 2216

## 2014-10-04 NOTE — ED Notes (Signed)
Patient c/o palpitations and feeling jittery today.

## 2014-10-24 ENCOUNTER — Encounter (HOSPITAL_COMMUNITY): Payer: Self-pay | Admitting: *Deleted

## 2014-10-24 ENCOUNTER — Emergency Department (HOSPITAL_COMMUNITY)
Admission: EM | Admit: 2014-10-24 | Discharge: 2014-10-24 | Disposition: A | Discharge status: Home / Self Care | Payer: Medicaid Other | Attending: Emergency Medicine | Admitting: Emergency Medicine

## 2014-10-24 DIAGNOSIS — Z87891 Personal history of nicotine dependence: Secondary | ICD-10-CM | POA: Diagnosis not present

## 2014-10-24 DIAGNOSIS — Z87440 Personal history of urinary (tract) infections: Secondary | ICD-10-CM | POA: Diagnosis not present

## 2014-10-24 DIAGNOSIS — H5711 Ocular pain, right eye: Secondary | ICD-10-CM | POA: Diagnosis present

## 2014-10-24 DIAGNOSIS — H01003 Unspecified blepharitis right eye, unspecified eyelid: Secondary | ICD-10-CM

## 2014-10-24 DIAGNOSIS — Z8619 Personal history of other infectious and parasitic diseases: Secondary | ICD-10-CM | POA: Diagnosis not present

## 2014-10-24 DIAGNOSIS — Z862 Personal history of diseases of the blood and blood-forming organs and certain disorders involving the immune mechanism: Secondary | ICD-10-CM | POA: Diagnosis not present

## 2014-10-24 NOTE — Discharge Instructions (Signed)
Blepharitis Blepharitis is redness, soreness, and swelling (inflammation) of one or both eyelids. It may be caused by an allergic reaction or a bacterial infection. Blepharitis may also be associated with reddened, scaly skin (seborrhea) of the scalp and eyebrows. While you sleep, eye discharge may cause your eyelashes to stick together. Your eyelids may itch, burn, swell, and may lose their lashes. These will grow back. Your eyes may become sensitive. Blepharitis may recur and need repeated treatment. If this is the case, you may require further evaluation by an eye specialist (ophthalmologist). HOME CARE INSTRUCTIONS   Keep your hands clean.  Use a clean towel each time you dry your eyelids. Do not use this towel to clean other areas. Do not share a towel or makeup with anyone.  Wash your eyelids with warm water or warm water mixed with a small amount of baby shampoo. Do this twice a day or as often as needed.  Wash your face and eyebrows at least once a day.  Use warm compresses 2 times a day for 10 minutes at a time, or as directed by your caregiver.  Apply antibiotic ointment as directed by your caregiver.  Avoid rubbing your eyes.  Avoid wearing makeup until you get better.  Follow up with your caregiver as directed. SEEK IMMEDIATE MEDICAL CARE IF:   You have pain, redness, or swelling that gets worse or spreads to other parts of your face.  Your vision changes, or you have pain when looking at lights or moving objects.  You have a fever.  Your symptoms continue for longer than 2 to 4 days or become worse. MAKE SURE YOU:   Understand these instructions.  Will watch your condition.  Will get help right away if you are not doing well or get worse. Document Released: 04/30/2000 Document Revised: 07/26/2011 Document Reviewed: 06/10/2010 ExitCare Patient Information 2015 [employer]. This information is not intended to replace advice given to you by your health care  provider. Make sure you discuss any questions you have with your health care provider.  

## 2014-10-24 NOTE — ED Notes (Signed)
Pt states pain, redness an swelling to R upper lid since yesterday.

## 2014-10-24 NOTE — ED Provider Notes (Signed)
CSN: [id number]     Arrival date & time 10/24/14  0915 History   This chart was scribed for non-physician practitioner Waynetta Pean, PA-C working with Blanchie Dessert, MD by Hilda Lias, ED Scribe. This patient was seen in room TR04C/TR04C and the patient's care was started at 9:47 AM.  Chief Complaint  Patient presents with  . Eye Pain     The history is provided by the patient. No language interpreter was used.     HPI Comments: Patricia Richards is a 24 y.o. female who presents to the Emergency Department complaining of constant right upper eyelid pain with associated swelling and headache that has been present since yesterday. Pt reports that she still currently has a headache. She denies any foreign body sensation or changes to her vision.   Eye swelling Began yesterday No discharge out of her eyes Tylenol yesterday no relief Headache Itching and pain underneath eye No fevers or chills No changes to vision Pt wears glasses  No discharge from eyes No sensitivity to light No coughing wheezing or abdominal pain  Past Medical History  Diagnosis Date  . Anemia   . Urinary tract infection     gets them frequently  . Pregnant   . Bacterial infection    Past Surgical History  Procedure Laterality Date  . Dilation and evacuation  11/28/2010   Family History  Problem Relation Age of Onset  . Hypertension Father   . Cancer Father   . Cancer Maternal Grandmother    History  Substance Use Topics  . Smoking status: Former Smoker    Quit date: 02/14/2014  . Smokeless tobacco: Never Used  . Alcohol Use: No   OB History    Gravida Para Term Preterm AB TAB SAB Ectopic Multiple Living   3 2 2  0 1 1 0 0 0 2     Review of Systems  HENT: Negative for ear pain, sore throat and trouble swallowing.   Eyes: Negative for photophobia, pain, discharge, redness, itching and visual disturbance.       Right Lid swelling.   Respiratory: Negative for cough and shortness of breath.        Allergies  Metronidazole and Tramadol  Home Medications   Prior to Admission medications   Medication Sig Start Date End Date Taking? Authorizing Provider  etonogestrel-ethinyl estradiol (NUVARING) 0.12-0.015 MG/24HR vaginal ring Place 1 each vaginally every 28 (twenty-eight) days. Insert vaginally and leave in place for 3 consecutive weeks, then remove for 1 week.    Historical Provider, MD  hydrOXYzine (ATARAX/VISTARIL) 25 MG tablet Take one every 12 hours as needed for panic attacks Patient not taking: Reported on 10/04/2014 03/07/14   Milton Ferguson, MD   BP 101/66 mmHg  Pulse 80  Temp(Src) 97.9 F (36.6 C) (Oral)  Resp 16  Ht 5\' 3"  (1.6 m)  Wt 110 lb (49.896 kg)  BMI 19.49 kg/m2  SpO2 100%  LMP 09/15/2014 Physical Exam  Constitutional: She appears well-developed and well-nourished. No distress.  HENT:  Head: Normocephalic and atraumatic.  Eyes: Conjunctivae and EOM are normal. Pupils are equal, round, and reactive to light. Right eye exhibits no chemosis and no discharge. Left eye exhibits no chemosis and no discharge. Right conjunctiva is not injected. Right conjunctiva has no hemorrhage. Left conjunctiva is not injected. Left conjunctiva has no hemorrhage. No scleral icterus.  Edema over the upper eyelid of right eye with slight overlying erythema.  No conjunctival injection. No discharge. No stye noted.  Neck: Neck supple.  Cardiovascular: Normal rate, regular rhythm, normal heart sounds and intact distal pulses.   Pulmonary/Chest: Effort normal and breath sounds normal. No respiratory distress.  Lymphadenopathy:    She has no cervical adenopathy.  Neurological: She is alert. Coordination normal.  Skin: Skin is warm and dry. No rash noted. She is not diaphoretic.  Psychiatric: She has a normal mood and affect. Her behavior is normal.  Nursing note and vitals reviewed.   ED Course  Procedures (including critical care time)  DIAGNOSTIC STUDIES: Oxygen  Saturation is 100% on room air, normal by my interpretation.    COORDINATION OF CARE: 9:59 AM Discussed treatment plan with pt at bedside and pt agreed to plan.   Labs Review Labs Reviewed - No data to display  Imaging Review No results found.   EKG Interpretation None      Filed Vitals:   10/24/14 0921 10/24/14 0957  BP: 104/65 101/66  Pulse: 85 80  Temp: 97.9 F (36.6 C)   TempSrc: Oral   Resp: 16 16  Height: 5\' 3"  (1.6 m)   Weight: 110 lb (49.896 kg)   SpO2: 100%      MDM   Meds given in ED:  Medications - No data to display  New Prescriptions   No medications on file    Final diagnoses:  Blepharitis of eyelid of right eye   This is a 24 year old female presents with right upper eyelid swelling with mild erythema ongoing for the past 2 days. Patient denies any changes to her vision, discharge, or eye pain. Patient has right upper eyelid edema with mild erythema without discharge.  She wears glasses but denies wearing contacts. Visual acuity is normal in her both eyes. Patient exam is consistent with blepharitis. Patient given education on using warm compresses and advised her to follow-up with her PCP to ensure resolution of her symptoms. I advised the patient to follow-up with their primary care provider this week. I advised the patient to return to the emergency department with new or worsening symptoms or new concerns. The patient verbalized understanding and agreement with plan.    I personally performed the services described in this documentation, which was scribed in my presence. The recorded information has been reviewed and is accurate.    Waynetta Pean, PA-C 10/24/14 1002  Blanchie Dessert, MD 10/25/14 2222

## 2014-12-12 ENCOUNTER — Encounter (HOSPITAL_COMMUNITY): Payer: Self-pay

## 2014-12-12 ENCOUNTER — Emergency Department (HOSPITAL_COMMUNITY): Payer: Medicaid Other

## 2014-12-12 ENCOUNTER — Emergency Department (HOSPITAL_COMMUNITY)
Admission: EM | Admit: 2014-12-12 | Discharge: 2014-12-12 | Disposition: A | Discharge status: Home / Self Care | Payer: Medicaid Other | Attending: Emergency Medicine | Admitting: Emergency Medicine

## 2014-12-12 DIAGNOSIS — Z862 Personal history of diseases of the blood and blood-forming organs and certain disorders involving the immune mechanism: Secondary | ICD-10-CM | POA: Diagnosis not present

## 2014-12-12 DIAGNOSIS — R05 Cough: Secondary | ICD-10-CM | POA: Diagnosis not present

## 2014-12-12 DIAGNOSIS — Z3202 Encounter for pregnancy test, result negative: Secondary | ICD-10-CM | POA: Diagnosis not present

## 2014-12-12 DIAGNOSIS — Z87891 Personal history of nicotine dependence: Secondary | ICD-10-CM | POA: Diagnosis not present

## 2014-12-12 DIAGNOSIS — R079 Chest pain, unspecified: Secondary | ICD-10-CM

## 2014-12-12 DIAGNOSIS — Z87440 Personal history of urinary (tract) infections: Secondary | ICD-10-CM | POA: Diagnosis not present

## 2014-12-12 DIAGNOSIS — R42 Dizziness and giddiness: Secondary | ICD-10-CM | POA: Diagnosis not present

## 2014-12-12 DIAGNOSIS — Z8619 Personal history of other infectious and parasitic diseases: Secondary | ICD-10-CM | POA: Diagnosis not present

## 2014-12-12 DIAGNOSIS — R0789 Other chest pain: Secondary | ICD-10-CM | POA: Diagnosis not present

## 2014-12-12 LAB — URINALYSIS, ROUTINE W REFLEX MICROSCOPIC
Bilirubin Urine: NEGATIVE
Glucose, UA: NEGATIVE mg/dL
Hgb urine dipstick: NEGATIVE
Ketones, ur: NEGATIVE mg/dL
LEUKOCYTES UA: NEGATIVE
Nitrite: NEGATIVE
PH: 6.5 (ref 5.0–8.0)
Protein, ur: NEGATIVE mg/dL
SPECIFIC GRAVITY, URINE: 1.01 (ref 1.005–1.030)
Urobilinogen, UA: 0.2 mg/dL (ref 0.0–1.0)

## 2014-12-12 LAB — POC URINE PREG, ED: PREG TEST UR: NEGATIVE

## 2014-12-12 NOTE — ED Provider Notes (Signed)
CSN: [id number]     Arrival date & time 12/12/14  0200 History   First MD Initiated Contact with Patient 12/12/14 0236     Chief Complaint  Patient presents with  . Dizziness     Patient is a 24 y.o. female presenting with dizziness. The history is provided by the patient.  Dizziness Quality:  Lightheadedness Severity:  Mild Onset quality:  Gradual Duration:  5 days Timing:  Constant Progression:  Unchanged Chronicity:  New Relieved by:  Nothing Worsened by:  Nothing Associated symptoms: no headaches and no vomiting   pt reports heavy ETOH use last weekend (over 5 days ago) The following day after ETOH use she has had dizziness No syncope No falls No HA No weakness She also mentions right sided chest wall pain that is worse with palpation She reports mild SOB She has mild cough but no hemoptysis No h/o DVT/PE is reported  Past Medical History  Diagnosis Date  . Anemia   . Urinary tract infection     gets them frequently  . Pregnant   . Bacterial infection    Past Surgical History  Procedure Laterality Date  . Dilation and evacuation  11/28/2010   Family History  Problem Relation Age of Onset  . Hypertension Father   . Cancer Father   . Cancer Maternal Grandmother    History  Substance Use Topics  . Smoking status: Former Smoker    Quit date: 02/14/2014  . Smokeless tobacco: Never Used  . Alcohol Use: Yes   OB History    Gravida Para Term Preterm AB TAB SAB Ectopic Multiple Living   3 2 2  0 1 1 0 0 0 2     Review of Systems  Constitutional: Negative for fever.  Respiratory: Positive for cough.   Gastrointestinal: Negative for vomiting.  Neurological: Positive for dizziness. Negative for syncope and headaches.  All other systems reviewed and are negative.     Allergies  Metronidazole and Tramadol  Home Medications   Prior to Admission medications   Medication Sig Start Date End Date Taking? Authorizing Provider  etonogestrel-ethinyl estradiol  (NUVARING) 0.12-0.015 MG/24HR vaginal ring Place 1 each vaginally every 28 (twenty-eight) days. Insert vaginally and leave in place for 3 consecutive weeks, then remove for 1 week.   Yes Historical Provider, MD  hydrOXYzine (ATARAX/VISTARIL) 25 MG tablet Take one every 12 hours as needed for panic attacks Patient not taking: Reported on 10/04/2014 03/07/14   Milton Ferguson, MD   BP 128/96 mmHg  Pulse 90  Temp(Src) 97.7 F (36.5 C) (Oral)  Resp 16  Ht 5\' 2"  (1.575 m)  Wt 108 lb (48.988 kg)  BMI 19.75 kg/m2  SpO2 99%  LMP 11/16/2014 Physical Exam CONSTITUTIONAL: Well developed/well nourished HEAD: Normocephalic/atraumatic EYES: EOMI/PERRL, no nystagmus, no ptosis ENMT: Mucous membranes moist NECK: supple no meningeal signs, no bruits SPINE/BACK:entire spine nontender CV: S1/S2 noted, no murmurs/rubs/gallops noted LUNGS: Lungs are clear to auscultation bilaterally, no apparent distress Chest - mild tenderness to right upper chest, no bruising/crepitus ABDOMEN: soft, nontender, no rebound or guarding GU:no cva tenderness NEURO:Awake/alert, facies symmetric, no arm or leg drift is noted Equal 5/5 strength with shoulder abduction, elbow flex/extension, wrist flex/extension in upper extremities and equal hand grips bilaterally Equal 5/5 strength with hip flexion,knee flex/extension, foot dorsi/plantar flexion Cranial nerves 3/4/5/6/11/22/08/11/12 tested and intact Gait normal without ataxia No past pointing Sensation to light touch intact in all extremities EXTREMITIES: pulses normal, full ROM SKIN: warm, color normal PSYCH: anxious  ED Course  Procedures   3:38 AM Pt well appearing She has neuro deficits EKG shows no acute change from prior She reports chest wall pain, thinks she may have fallen last weekend while intoxicated and may have injured chest Will get CXR Pt otherwise stable.   Doubt ACS/PE/Dissection at this time  Pt resting comfortably Watching TV, no  distress No hypoxia/tachypnea noted Stable for d/c home and f/u as outpatient  Labs Review Labs Reviewed  URINALYSIS, [place] (NOT AT [facility]) - Abnormal; Notable for the following:    Color, Urine STRAW (*)    All other components within normal limits  POC URINE PREG, ED    Imaging Review Dg Chest 2 View  12/12/2014   CLINICAL DATA:  Chest pain. Dizziness and shortness of breath. Symptoms for 4 days.  EXAM: CHEST  2 VIEW  COMPARISON:  10/04/2014  FINDINGS: The cardiomediastinal contours are normal. The lungs are clear. Pulmonary vasculature is normal. No consolidation, pleural effusion, or pneumothorax. No acute osseous abnormalities are seen.  IMPRESSION: No acute pulmonary process.   Electronically Signed   By: Jeb Levering M.D.   On: 12/12/2014 03:49    ED ECG REPORT   Date: 12/12/2014 0231  Rate: 89  Rhythm: normal sinus rhythm  QRS Axis: right  Intervals: normal  ST/T Wave abnormalities: normal  Conduction Disutrbances:none   MDM   Final diagnoses:  Dizziness  Chest pain, unspecified chest pain type    Nursing notes including past medical history and social history reviewed and considered in documentation Labs/vital reviewed myself and considered during evaluation xrays/imaging reviewed by myself and considered during evaluation     Ripley Fraise, MD 12/12/14 [phone]

## 2014-12-12 NOTE — ED Notes (Signed)
Pt has multiple complaints, states for the past 4 days she has felt dizzy, sob, pain in right shoulder, chest discomfort and tightness, coughing today

## 2014-12-12 NOTE — Discharge Instructions (Signed)

## 2015-01-20 ENCOUNTER — Emergency Department (HOSPITAL_COMMUNITY)
Admission: EM | Admit: 2015-01-20 | Discharge: 2015-01-21 | Disposition: A | Discharge status: Home / Self Care | Payer: Medicaid Other | Attending: Emergency Medicine | Admitting: Emergency Medicine

## 2015-01-20 DIAGNOSIS — B373 Candidiasis of vulva and vagina: Secondary | ICD-10-CM | POA: Diagnosis not present

## 2015-01-20 DIAGNOSIS — Y998 Other external cause status: Secondary | ICD-10-CM | POA: Insufficient documentation

## 2015-01-20 DIAGNOSIS — Z87891 Personal history of nicotine dependence: Secondary | ICD-10-CM | POA: Insufficient documentation

## 2015-01-20 DIAGNOSIS — Z87440 Personal history of urinary (tract) infections: Secondary | ICD-10-CM | POA: Insufficient documentation

## 2015-01-20 DIAGNOSIS — Y9289 Other specified places as the place of occurrence of the external cause: Secondary | ICD-10-CM | POA: Insufficient documentation

## 2015-01-20 DIAGNOSIS — S80862A Insect bite (nonvenomous), left lower leg, initial encounter: Secondary | ICD-10-CM | POA: Diagnosis not present

## 2015-01-20 DIAGNOSIS — N72 Inflammatory disease of cervix uteri: Secondary | ICD-10-CM

## 2015-01-20 DIAGNOSIS — W57XXXA Bitten or stung by nonvenomous insect and other nonvenomous arthropods, initial encounter: Secondary | ICD-10-CM | POA: Diagnosis not present

## 2015-01-20 DIAGNOSIS — Z3202 Encounter for pregnancy test, result negative: Secondary | ICD-10-CM | POA: Insufficient documentation

## 2015-01-20 DIAGNOSIS — B3731 Acute candidiasis of vulva and vagina: Secondary | ICD-10-CM

## 2015-01-20 DIAGNOSIS — Z862 Personal history of diseases of the blood and blood-forming organs and certain disorders involving the immune mechanism: Secondary | ICD-10-CM | POA: Diagnosis not present

## 2015-01-20 DIAGNOSIS — Y9389 Activity, other specified: Secondary | ICD-10-CM | POA: Diagnosis not present

## 2015-01-20 DIAGNOSIS — N898 Other specified noninflammatory disorders of vagina: Secondary | ICD-10-CM | POA: Diagnosis present

## 2015-01-20 LAB — URINALYSIS, ROUTINE W REFLEX MICROSCOPIC
BILIRUBIN URINE: NEGATIVE
GLUCOSE, UA: NEGATIVE mg/dL
Hgb urine dipstick: NEGATIVE
Ketones, ur: NEGATIVE mg/dL
Nitrite: NEGATIVE
PH: 8 (ref 5.0–8.0)
Protein, ur: NEGATIVE mg/dL
Specific Gravity, Urine: 1.02 (ref 1.005–1.030)
Urobilinogen, UA: 0.2 mg/dL (ref 0.0–1.0)

## 2015-01-20 LAB — WET PREP, GENITAL: TRICH WET PREP: NONE SEEN

## 2015-01-20 LAB — URINE MICROSCOPIC-ADD ON

## 2015-01-20 LAB — POC URINE PREG, ED: Preg Test, Ur: NEGATIVE

## 2015-01-20 MED ORDER — CEFTRIAXONE SODIUM 250 MG IJ SOLR
250.0000 mg | Freq: Once | INTRAMUSCULAR | Status: AC
  Administered 2015-01-21: 250 mg via INTRAMUSCULAR
  Filled 2015-01-20: qty 250

## 2015-01-20 MED ORDER — FLUCONAZOLE 100 MG PO TABS
150.0000 mg | ORAL_TABLET | Freq: Once | ORAL | Status: AC
  Administered 2015-01-21: 150 mg via ORAL
  Filled 2015-01-20: qty 2

## 2015-01-20 MED ORDER — DIPHENHYDRAMINE HCL 25 MG PO CAPS
25.0000 mg | ORAL_CAPSULE | Freq: Once | ORAL | Status: AC
  Administered 2015-01-20: 25 mg via ORAL
  Filled 2015-01-20: qty 1

## 2015-01-20 MED ORDER — AZITHROMYCIN 250 MG PO TABS
1000.0000 mg | ORAL_TABLET | Freq: Once | ORAL | Status: AC
  Administered 2015-01-21: 1000 mg via ORAL
  Filled 2015-01-20: qty 4

## 2015-01-20 NOTE — Discharge Instructions (Signed)
Cervicitis Cervicitis is a soreness and puffiness (inflammation) of the cervix.  HOME CARE  Do not have sex (intercourse) until your doctor says it is okay.  Do not have sex until your partner is treated or as told by your doctor.  Take your antibiotic medicine as told. Finish it even if you start to feel better. GET HELP IF:   Your symptoms that brought you to the doctor come back.  You have a fever. MAKE SURE YOU:   Understand these instructions.  Will watch your condition.  Will get help right away if you are not doing well or get worse. Document Released: 02/10/2008 Document Revised: 05/08/2013 Document Reviewed: 10/25/2012 [facility] Patient Information 2015 [place], [place]. This information is not intended to replace advice given to you by your health care provider. Make sure you discuss any questions you have with your health care provider.  Candidal Vulvovaginitis Candidal vulvovaginitis is an infection of the vagina and vulva. The vulva is the skin around the opening of the vagina. This may cause itching and discomfort in and around the vagina.  HOME CARE  Only take medicine as told by your doctor.  Do not have sex (intercourse) until the infection is healed or as told by your doctor.  Practice safe sex.  Tell your sex partner about your infection.  Do not douche or use tampons.  Wear cotton underwear. Do not wear tight pants or panty hose.  Eat yogurt. This may help treat and prevent yeast infections. GET HELP RIGHT AWAY IF:   You have a fever.  Your problems get worse during treatment or do not get better in 3 days.  You have discomfort, irritation, or itching in your vagina or vulva area.  You have pain after sex.  You start to get belly (abdominal) pain. MAKE SURE YOU:  Understand these instructions.  Will watch your condition.  Will get help right away if you are not doing well or get worse. Document Released: 07/30/2008 Document Revised:  05/08/2013 Document Reviewed: 07/30/2008 [facility] Patient Information 2015 [place], [place]. This information is not intended to replace advice given to you by your health care provider. Make sure you discuss any questions you have with your health care provider.      Take you next dose of diflucan tomorrow evening.  Do not have intercourse until you have finished your treatment and your symptoms are gone.  Your partner will need to be notified if your std cultures are positive as he will also need to be treated.

## 2015-01-20 NOTE — ED Notes (Signed)
Pt states she was bitten by insects or mosquitos today and has several bites "all over" cant stop itching. She also complains of vaginal itching from possible yeast infection.

## 2015-01-21 MED ORDER — FLUCONAZOLE 150 MG PO TABS: 150.0000 mg | ORAL_TABLET | Freq: Every day | ORAL | Status: AC

## 2015-01-21 MED ORDER — LIDOCAINE HCL (PF) 1 % IJ SOLN
INTRAMUSCULAR | Status: AC
  Filled 2015-01-21: qty 5

## 2015-01-22 LAB — RPR: RPR Ser Ql: NONREACTIVE

## 2015-01-22 LAB — GC/CHLAMYDIA PROBE AMP (~~LOC~~) NOT AT ARMC
Chlamydia: NEGATIVE
Neisseria Gonorrhea: NEGATIVE

## 2015-01-22 LAB — HIV ANTIBODY (ROUTINE TESTING W REFLEX): HIV SCREEN 4TH GENERATION: NONREACTIVE

## 2015-01-22 NOTE — ED Provider Notes (Signed)
CSN: [id number]     Arrival date & time 01/20/15  2120 History   First MD Initiated Contact with Patient 01/20/15 2139     Chief Complaint  Patient presents with  . Insect Bite     (Consider location/radiation/quality/duration/timing/severity/associated sxs/prior Treatment) The history is provided by the patient.   Patricia Richards is a 24 y.o. female who was recently sexually active with a new partner, unprotected sex and now with complaint of vaginal discharge, vaginal itching which has not responded to monistat (tx x 1 ytd).  Her symptoms started 3 days ago.  She denies fevers, nausea, dysuria or abdominal or pelvic pain.  Her LMP was 01/04/15.    Additionally has complaint of multiple itchy insect bites on her left leg occuring tonight while at a cookout.  She has had no treatments for this complaint.      Past Medical History  Diagnosis Date  . Anemia   . Urinary tract infection     gets them frequently  . Pregnant   . Bacterial infection    Past Surgical History  Procedure Laterality Date  . Dilation and evacuation  11/28/2010   Family History  Problem Relation Age of Onset  . Hypertension Father   . Cancer Father   . Cancer Maternal Grandmother    Social History  Substance Use Topics  . Smoking status: Former Smoker    Quit date: 02/14/2014  . Smokeless tobacco: Never Used  . Alcohol Use: Yes   OB History    Gravida Para Term Preterm AB TAB SAB Ectopic Multiple Living   3 2 2  0 1 1 0 0 0 2     Review of Systems  Constitutional: Negative for fever and chills.  HENT: Negative for congestion and sore throat.   Eyes: Negative.   Respiratory: Negative for chest tightness and shortness of breath.   Cardiovascular: Negative for chest pain.  Gastrointestinal: Negative for nausea, vomiting and abdominal pain.  Genitourinary: Positive for vaginal discharge. Negative for dysuria, vaginal bleeding and vaginal pain.  Musculoskeletal: Negative for joint swelling,  arthralgias and neck pain.  Skin: Negative.  Negative for rash and wound.  Neurological: Negative for dizziness, weakness, light-headedness, numbness and headaches.  Psychiatric/Behavioral: Negative.       Allergies  Metronidazole and Tramadol  Home Medications   Prior to Admission medications   Medication Sig Start Date End Date Taking? Authorizing Provider  etonogestrel-ethinyl estradiol (NUVARING) 0.12-0.015 MG/24HR vaginal ring Place 1 each vaginally every 28 (twenty-eight) days. Insert vaginally and leave in place for 3 consecutive weeks, then remove for 1 week.    Historical Provider, MD  fluconazole (DIFLUCAN) 150 MG tablet Take 1 tablet (150 mg total) by mouth daily. 01/21/15 01/28/15  [name], PA-C  hydrOXYzine (ATARAX/VISTARIL) 25 MG tablet Take one every 12 hours as needed for panic attacks Patient not taking: Reported on 10/04/2014 03/07/14   Milton Ferguson, MD   BP 128/83 mmHg  Pulse 99  Temp(Src) 98 F (36.7 C) (Oral)  Resp 22  Ht 5\' 3"  (1.6 m)  Wt 105 lb (47.628 kg)  BMI 18.60 kg/m2  SpO2 100%  LMP 01/04/2015 Physical Exam  Constitutional: She appears well-developed and well-nourished.  HENT:  Head: Normocephalic and atraumatic.  Eyes: Conjunctivae are normal.  Neck: Normal range of motion.  Cardiovascular: Normal rate, regular rhythm, normal heart sounds and intact distal pulses.   Pulmonary/Chest: Effort normal and breath sounds normal. She has no wheezes.  Abdominal: Soft. Bowel sounds are  normal. She exhibits no distension. There is no tenderness. There is no guarding.  Genitourinary: There is no tenderness or lesion on the right labia. There is no tenderness or lesion on the left labia. Cervix exhibits no motion tenderness, no discharge and no friability. Right adnexum displays no tenderness and no fullness. Left adnexum displays no tenderness and no fullness. There is erythema in the vagina. No tenderness in the vagina. Vaginal discharge found.   Musculoskeletal: Normal range of motion.  Neurological: She is alert.  Skin: Skin is warm and dry.  5 insect bites left leg, appear to be mosquito bites.  Psychiatric: She has a normal mood and affect.  Nursing note and vitals reviewed.   ED Course  Procedures (including critical care time) Labs Review Labs Reviewed  WET PREP, GENITAL - Abnormal; Notable for the following:    Yeast Wet Prep HPF POC FEW (*)    Clue Cells Wet Prep HPF POC FEW (*)    WBC, Wet Prep HPF POC MANY (*)    All other components within normal limits  URINALYSIS, ROUTINE W REFLEX MICROSCOPIC (NOT AT [facility]) - Abnormal; Notable for the following:    APPearance HAZY (*)    Leukocytes, UA MODERATE (*)    All other components within normal limits  URINE MICROSCOPIC-ADD ON - Abnormal; Notable for the following:    Squamous Epithelial / LPF MANY (*)    Bacteria, UA MANY (*)    All other components within normal limits  RPR  HIV ANTIBODY (ROUTINE TESTING)  POC URINE PREG, ED  GC/CHLAMYDIA PROBE AMP ([place]) NOT AT [facility]    Imaging Review No results found. I have personally reviewed and evaluated these images and lab results as part of my medical decision-making.   EKG Interpretation None      MDM   Final diagnoses:  Yeast vaginitis  Cervicitis    Patients  labs reviewed.  Radiological studies were viewed, interpreted and considered during the medical decision making and disposition process. I agree with radiologists reading.  Results were also discussed with patient.  Pt offered gc/chlamydia coverage pending cultures which she approves.  Rocephin and zithromax given.  She was given diflucan for the yeast vaginitis.  Benadryl given for insect bites, suspect mosquito bites.  Cultures pending.  The patient appears reasonably screened and/or stabilized for discharge and I doubt any other medical condition or other [facility] requiring further screening, evaluation, or treatment in the ED at this time prior to  discharge.     [name], PA-C 01/22/15 [place], PA-C 01/22/15 [place], DO 01/23/15 2220

## 2015-01-24 ENCOUNTER — Telehealth (HOSPITAL_BASED_OUTPATIENT_CLINIC_OR_DEPARTMENT_OTHER): Payer: Self-pay | Admitting: Emergency Medicine

## 2015-03-09 ENCOUNTER — Emergency Department (HOSPITAL_COMMUNITY)
Admission: EM | Admit: 2015-03-09 | Discharge: 2015-03-09 | Disposition: A | Discharge status: Home / Self Care | Payer: Medicaid Other | Attending: Emergency Medicine | Admitting: Emergency Medicine

## 2015-03-09 ENCOUNTER — Encounter (HOSPITAL_COMMUNITY): Payer: Self-pay | Admitting: Emergency Medicine

## 2015-03-09 DIAGNOSIS — D649 Anemia, unspecified: Secondary | ICD-10-CM | POA: Insufficient documentation

## 2015-03-09 DIAGNOSIS — Z87891 Personal history of nicotine dependence: Secondary | ICD-10-CM | POA: Insufficient documentation

## 2015-03-09 DIAGNOSIS — Z87440 Personal history of urinary (tract) infections: Secondary | ICD-10-CM | POA: Insufficient documentation

## 2015-03-09 DIAGNOSIS — Z8619 Personal history of other infectious and parasitic diseases: Secondary | ICD-10-CM | POA: Insufficient documentation

## 2015-03-09 DIAGNOSIS — Z3490 Encounter for supervision of normal pregnancy, unspecified, unspecified trimester: Secondary | ICD-10-CM

## 2015-03-09 DIAGNOSIS — Z3A01 Less than 8 weeks gestation of pregnancy: Secondary | ICD-10-CM | POA: Insufficient documentation

## 2015-03-09 DIAGNOSIS — O99011 Anemia complicating pregnancy, first trimester: Secondary | ICD-10-CM | POA: Insufficient documentation

## 2015-03-09 DIAGNOSIS — R109 Unspecified abdominal pain: Secondary | ICD-10-CM | POA: Insufficient documentation

## 2015-03-09 DIAGNOSIS — O9989 Other specified diseases and conditions complicating pregnancy, childbirth and the puerperium: Secondary | ICD-10-CM | POA: Diagnosis present

## 2015-03-09 LAB — HCG, QUANTITATIVE, PREGNANCY: HCG, BETA CHAIN, QUANT, S: 44 m[IU]/mL — AB (ref <5)

## 2015-03-09 LAB — POC URINE PREG, ED: PREG TEST UR: POSITIVE — AB

## 2015-03-09 MED ORDER — PRENATAL COMPLETE 14-0.4 MG PO TABS: 1.0000 | ORAL_TABLET | Freq: Every day | ORAL | Status: DC

## 2015-03-09 NOTE — ED Notes (Signed)
Pt states she had a positive pregnancy test today and has been having abdominal cramping for 2 days.  No bleeding

## 2015-03-09 NOTE — ED Provider Notes (Signed)
CSN: [id number]     Arrival date & time 03/09/15  1924 History  By signing my name below, I, Patricia Richards, attest that this documentation has been prepared under the direction and in the presence of Patricia Jefferson, PA-C. Electronically Signed: Starleen Richards ED Scribe. 03/09/2015. 1:09 PM.    Chief Complaint  Patient presents with  . Abdominal Cramping   The history is provided by the patient. No language interpreter was used.   HPI Comments: Patricia Richards is a 24 y.o. female who presents to the Emergency Department complaining of intermittent low pelvic cramping described as " like her normal menstrual cramping."  Patient reports a positive at-home urine pregnancy test today.  Patient reports using the Nuva Ring until September when she intentionally stopped using as she is trying to get pregnant.  She denies nausea, vomiting, vaginal discharge or bleeding. LNMP 01/31/15 and was a normal cycle.   OB/GYN: none  Past Medical History  Diagnosis Date  . Anemia   . Urinary tract infection     gets them frequently  . Pregnant   . Bacterial infection    Past Surgical History  Procedure Laterality Date  . Dilation and evacuation  11/28/2010   Family History  Problem Relation Age of Onset  . Hypertension Father   . Cancer Father   . Cancer Maternal Grandmother    Social History  Substance Use Topics  . Smoking status: Former Smoker    Quit date: 02/14/2014  . Smokeless tobacco: Never Used  . Alcohol Use: Yes   OB History    Gravida Para Term Preterm AB TAB SAB Ectopic Multiple Living   3 2 2  0 1 1 0 0 0 2     Review of Systems  Constitutional: Negative for fever.  HENT: Negative for congestion and sore throat.   Eyes: Negative.   Respiratory: Negative for chest tightness and shortness of breath.   Cardiovascular: Negative for chest pain.  Gastrointestinal: Negative for nausea and abdominal pain.  Genitourinary: Negative for dysuria, urgency, frequency, hematuria, vaginal  bleeding, vaginal discharge and menstrual problem.  Musculoskeletal: Negative for joint swelling, arthralgias and neck pain.  Skin: Negative.  Negative for rash and wound.  Neurological: Negative for dizziness, weakness, light-headedness, numbness and headaches.  Psychiatric/Behavioral: Negative.       Allergies  Metronidazole and Tramadol  Home Medications   Prior to Admission medications   Medication Sig Start Date End Date Taking? Authorizing Provider  acetaminophen (TYLENOL) 500 MG tablet Take 1,500 mg by mouth every 6 (six) hours as needed for mild pain.   Yes Historical Provider, MD  hydrOXYzine (ATARAX/VISTARIL) 25 MG tablet Take one every 12 hours as needed for panic attacks Patient not taking: Reported on 10/04/2014 03/07/14   Milton Ferguson, MD  Prenatal Vit-Fe Fumarate-FA (PRENATAL COMPLETE) 14-0.4 MG TABS Take 1 tablet by mouth daily. 03/09/15   Patricia Jefferson, PA-C   BP 110/76 mmHg  Pulse 100  Temp(Src) 98.2 F (36.8 C) (Oral)  Resp 18  Ht 5\' 2"  (1.575 m)  Wt 105 lb (47.628 kg)  BMI 19.20 kg/m2  SpO2 100%  LMP 01/31/2015 (Approximate) Physical Exam  Constitutional: She appears well-developed and well-nourished.  HENT:  Head: Normocephalic and atraumatic.  Eyes: Conjunctivae are normal.  Neck: Normal range of motion.  Cardiovascular: Normal rate, regular rhythm, normal heart sounds and intact distal pulses.   Pulmonary/Chest: Effort normal and breath sounds normal. She has no wheezes.  Abdominal: Soft. Bowel sounds are normal. She  exhibits no distension and no mass. There is no tenderness. There is no rebound and no guarding.  Musculoskeletal: Normal range of motion.  Neurological: She is alert.  Skin: Skin is warm and dry.  Psychiatric: She has a normal mood and affect.  Nursing note and vitals reviewed.   ED Course  Procedures (including critical care time)  DIAGNOSTIC STUDIES: Oxygen Saturation is 100% on RA, normal by my interpretation.    COORDINATION  OF CARE:    Labs Review Labs Reviewed  HCG, QUANTITATIVE, PREGNANCY - Abnormal; Notable for the following:    hCG, Beta Chain, Quant, S 44 (*)    All other components within normal limits  POC URINE PREG, ED - Abnormal; Notable for the following:    Preg Test, Ur POSITIVE (*)    All other components within normal limits    Imaging Review No results found. I have personally reviewed and evaluated these images and lab results as part of my medical decision-making.   EKG Interpretation None      MDM   Final diagnoses:  Pregnancy    Patients labs reviewed.   Results were also discussed with patient. Pt apparently very newly pregnant, dates and hCG c/w [redacted] week gestation.  She was placed on prenatal vitamins and referred to gyn to establish prenatal care. She is in no distress during this visit, appears comfortable,has her toddler son also he seeking tx for fever.  No vaginal dc/bleeding.  No further testing felt warranted at this time.  Pt was encouraged to establish care with [facility] for further management of her pregnancy. Pt understands and agrees with plan.   I personally performed the services described in this documentation, which was scribed in my presence. The recorded information has been reviewed and is accurate.   Patricia Jefferson, PA-C 03/11/15 [place], MD 03/12/15 [phone]

## 2015-03-09 NOTE — Discharge Instructions (Signed)
First Trimester of Pregnancy The first trimester of pregnancy is from week 1 until the end of week 12 (months 1 through 3). A week after a sperm fertilizes an egg, the egg will implant on the wall of the uterus. This embryo will begin to develop into a baby. Genes from you and your partner are forming the baby. The female genes determine whether the baby is a boy or a girl. At 6-8 weeks, the eyes and face are formed, and the heartbeat can be seen on ultrasound. At the end of 12 weeks, all the baby's organs are formed.  Now that you are pregnant, you will want to do everything you can to have a healthy baby. Two of the most important things are to get good prenatal care and to follow your health care provider's instructions. Prenatal care is all the medical care you receive before the baby's birth. This care will help prevent, find, and treat any problems during the pregnancy and childbirth. BODY CHANGES Your body goes through many changes during pregnancy. The changes vary from woman to woman.   You may gain or lose a couple of pounds at first.  You may feel sick to your stomach (nauseous) and throw up (vomit). If the vomiting is uncontrollable, call your health care provider.  You may tire easily.  You may develop headaches that can be relieved by medicines approved by your health care provider.  You may urinate more often. Painful urination may mean you have a bladder infection.  You may develop heartburn as a result of your pregnancy.  You may develop constipation because certain hormones are causing the muscles that push waste through your intestines to slow down.  You may develop hemorrhoids or swollen, bulging veins (varicose veins).  Your breasts may begin to grow larger and become tender. Your nipples may stick out more, and the tissue that surrounds them (areola) may become darker.  Your gums may bleed and may be sensitive to brushing and flossing.  Dark spots or blotches (chloasma,  mask of pregnancy) may develop on your face. This will likely fade after the baby is born.  Your menstrual periods will stop.  You may have a loss of appetite.  You may develop cravings for certain kinds of food.  You may have changes in your emotions from day to day, such as being excited to be pregnant or being concerned that something may go wrong with the pregnancy and baby.  You may have more vivid and strange dreams.  You may have changes in your hair. These can include thickening of your hair, rapid growth, and changes in texture. Some women also have hair loss during or after pregnancy, or hair that feels dry or thin. Your hair will most likely return to normal after your baby is born. WHAT TO EXPECT AT YOUR PRENATAL VISITS During a routine prenatal visit:  You will be weighed to make sure you and the baby are growing normally.  Your blood pressure will be taken.  Your abdomen will be measured to track your baby's growth.  The fetal heartbeat will be listened to starting around week 10 or 12 of your pregnancy.  Test results from any previous visits will be discussed. Your health care provider may ask you:  How you are feeling.  If you are feeling the baby move.  If you have had any abnormal symptoms, such as leaking fluid, bleeding, severe headaches, or abdominal cramping.  If you are using any tobacco products,   including cigarettes, chewing tobacco, and electronic cigarettes.  If you have any questions. Other tests that may be performed during your first trimester include:  Blood tests to find your blood type and to check for the presence of any previous infections. They will also be used to check for low iron levels (anemia) and Rh antibodies. Later in the pregnancy, blood tests for diabetes will be done along with other tests if problems develop.  Urine tests to check for infections, diabetes, or protein in the urine.  An ultrasound to confirm the proper growth  and development of the baby.  An amniocentesis to check for possible genetic problems.  Fetal screens for spina bifida and Down syndrome.  You may need other tests to make sure you and the baby are doing well.  HIV (human immunodeficiency virus) testing. Routine prenatal testing includes screening for HIV, unless you choose not to have this test. HOME CARE INSTRUCTIONS  Medicines  Follow your health care provider's instructions regarding medicine use. Specific medicines may be either safe or unsafe to take during pregnancy.  Take your prenatal vitamins as directed.  If you develop constipation, try taking a stool softener if your health care provider approves. Diet  Eat regular, well-balanced meals. Choose a variety of foods, such as meat or vegetable-based protein, fish, milk and low-fat dairy products, vegetables, fruits, and whole grain breads and cereals. Your health care provider will help you determine the amount of weight gain that is right for you.  Avoid raw meat and uncooked cheese. These carry germs that can cause birth defects in the baby.  Eating four or five small meals rather than three large meals a day may help relieve nausea and vomiting. If you start to feel nauseous, eating a few soda crackers can be helpful. Drinking liquids between meals instead of during meals also seems to help nausea and vomiting.  If you develop constipation, eat more high-fiber foods, such as fresh vegetables or fruit and whole grains. Drink enough fluids to keep your urine clear or pale yellow. Activity and Exercise  Exercise only as directed by your health care provider. Exercising will help you:  Control your weight.  Stay in shape.  Be prepared for labor and delivery.  Experiencing pain or cramping in the lower abdomen or low back is a good sign that you should stop exercising. Check with your health care provider before continuing normal exercises.  Try to avoid standing for long  periods of time. Move your legs often if you must stand in one place for a long time.  Avoid heavy lifting.  Wear low-heeled shoes, and practice good posture.  You may continue to have sex unless your health care provider directs you otherwise. Relief of Pain or Discomfort  Wear a good support bra for breast tenderness.   Take warm sitz baths to soothe any pain or discomfort caused by hemorrhoids. Use hemorrhoid cream if your health care provider approves.   Rest with your legs elevated if you have leg cramps or low back pain.  If you develop varicose veins in your legs, wear support hose. Elevate your feet for 15 minutes, 3-4 times a day. Limit salt in your diet. Prenatal Care  Schedule your prenatal visits by the twelfth week of pregnancy. They are usually scheduled monthly at first, then more often in the last 2 months before delivery.  Write down your questions. Take them to your prenatal visits.  Keep all your prenatal visits as directed by your   health care provider. Safety  Wear your seat belt at all times when driving.  Make a list of emergency phone numbers, including numbers for family, friends, the hospital, and police and fire departments. General Tips  Ask your health care provider for a referral to a local prenatal education class. Begin classes no later than at the beginning of month 6 of your pregnancy.  Ask for help if you have counseling or nutritional needs during pregnancy. Your health care provider can offer advice or refer you to specialists for help with various needs.  Do not use hot tubs, steam rooms, or saunas.  Do not douche or use tampons or scented sanitary pads.  Do not cross your legs for long periods of time.  Avoid cat litter boxes and soil used by cats. These carry germs that can cause birth defects in the baby and possibly loss of the fetus by miscarriage or stillbirth.  Avoid all smoking, herbs, alcohol, and medicines not prescribed by  your health care provider. Chemicals in these affect the formation and growth of the baby.  Do not use any tobacco products, including cigarettes, chewing tobacco, and electronic cigarettes. If you need help quitting, ask your health care provider. You may receive counseling support and other resources to help you quit.  Schedule a dentist appointment. At home, brush your teeth with a soft toothbrush and be gentle when you floss. SEEK MEDICAL CARE IF:   You have dizziness.  You have mild pelvic cramps, pelvic pressure, or nagging pain in the abdominal area.  You have persistent nausea, vomiting, or diarrhea.  You have a bad smelling vaginal discharge.  You have pain with urination.  You notice increased swelling in your face, hands, legs, or ankles. SEEK IMMEDIATE MEDICAL CARE IF:   You have a fever.  You are leaking fluid from your vagina.  You have spotting or bleeding from your vagina.  You have severe abdominal cramping or pain.  You have rapid weight gain or loss.  You vomit blood or material that looks like coffee grounds.  You are exposed to [place] measles and have never had them.  You are exposed to fifth disease or chickenpox.  You develop a severe headache.  You have shortness of breath.  You have any kind of trauma, such as from a fall or a car accident.   This information is not intended to replace advice given to you by your health care provider. Make sure you discuss any questions you have with your health care provider.   Document Released: 04/27/2001 Document Revised: 05/24/2014 Document Reviewed: 03/13/2013 Elsevier Interactive Patient Education 2016 [employer]  

## 2015-03-18 ENCOUNTER — Ambulatory Visit (INDEPENDENT_AMBULATORY_CARE_PROVIDER_SITE_OTHER): Payer: Medicaid Other | Admitting: Adult Health

## 2015-03-18 ENCOUNTER — Encounter: Payer: Self-pay | Admitting: Adult Health

## 2015-03-18 VITALS — BP 110/70 | HR 88 | Ht 62.0 in | Wt 108.0 lb

## 2015-03-18 DIAGNOSIS — N926 Irregular menstruation, unspecified: Secondary | ICD-10-CM

## 2015-03-18 DIAGNOSIS — F1011 Alcohol abuse, in remission: Secondary | ICD-10-CM | POA: Insufficient documentation

## 2015-03-18 DIAGNOSIS — F1010 Alcohol abuse, uncomplicated: Secondary | ICD-10-CM | POA: Diagnosis not present

## 2015-03-18 DIAGNOSIS — Z3201 Encounter for pregnancy test, result positive: Secondary | ICD-10-CM

## 2015-03-18 DIAGNOSIS — O3680X0 Pregnancy with inconclusive fetal viability, not applicable or unspecified: Secondary | ICD-10-CM

## 2015-03-18 DIAGNOSIS — O99311 Alcohol use complicating pregnancy, first trimester: Secondary | ICD-10-CM

## 2015-03-18 DIAGNOSIS — Z3490 Encounter for supervision of normal pregnancy, unspecified, unspecified trimester: Secondary | ICD-10-CM

## 2015-03-18 HISTORY — DX: Alcohol abuse, uncomplicated: F10.10

## 2015-03-18 LAB — POCT URINE PREGNANCY: Preg Test, Ur: POSITIVE — AB

## 2015-03-18 NOTE — Patient Instructions (Signed)
First Trimester of Pregnancy The first trimester of pregnancy is from week 1 until the end of week 12 (months 1 through 3). A week after a sperm fertilizes an egg, the egg will implant on the wall of the uterus. This embryo will begin to develop into a baby. Genes from you and your partner are forming the baby. The female genes determine whether the baby is a boy or a girl. At 6-8 weeks, the eyes and face are formed, and the heartbeat can be seen on ultrasound. At the end of 12 weeks, all the baby's organs are formed.  Now that you are pregnant, you will want to do everything you can to have a healthy baby. Two of the most important things are to get good prenatal care and to follow your health care provider's instructions. Prenatal care is all the medical care you receive before the baby's birth. This care will help prevent, find, and treat any problems during the pregnancy and childbirth. BODY CHANGES Your body goes through many changes during pregnancy. The changes vary from woman to woman.   You may gain or lose a couple of pounds at first.  You may feel sick to your stomach (nauseous) and throw up (vomit). If the vomiting is uncontrollable, call your health care provider.  You may tire easily.  You may develop headaches that can be relieved by medicines approved by your health care provider.  You may urinate more often. Painful urination may mean you have a bladder infection.  You may develop heartburn as a result of your pregnancy.  You may develop constipation because certain hormones are causing the muscles that push waste through your intestines to slow down.  You may develop hemorrhoids or swollen, bulging veins (varicose veins).  Your breasts may begin to grow larger and become tender. Your nipples may stick out more, and the tissue that surrounds them (areola) may become darker.  Your gums may bleed and may be sensitive to brushing and flossing.  Dark spots or blotches (chloasma,  mask of pregnancy) may develop on your face. This will likely fade after the baby is born.  Your menstrual periods will stop.  You may have a loss of appetite.  You may develop cravings for certain kinds of food.  You may have changes in your emotions from day to day, such as being excited to be pregnant or being concerned that something may go wrong with the pregnancy and baby.  You may have more vivid and strange dreams.  You may have changes in your hair. These can include thickening of your hair, rapid growth, and changes in texture. Some women also have hair loss during or after pregnancy, or hair that feels dry or thin. Your hair will most likely return to normal after your baby is born. WHAT TO EXPECT AT YOUR PRENATAL VISITS During a routine prenatal visit:  You will be weighed to make sure you and the baby are growing normally.  Your blood pressure will be taken.  Your abdomen will be measured to track your baby's growth.  The fetal heartbeat will be listened to starting around week 10 or 12 of your pregnancy.  Test results from any previous visits will be discussed. Your health care provider may ask you:  How you are feeling.  If you are feeling the baby move.  If you have had any abnormal symptoms, such as leaking fluid, bleeding, severe headaches, or abdominal cramping.  If you are using any tobacco products,   including cigarettes, chewing tobacco, and electronic cigarettes.  If you have any questions. Other tests that may be performed during your first trimester include:  Blood tests to find your blood type and to check for the presence of any previous infections. They will also be used to check for low iron levels (anemia) and Rh antibodies. Later in the pregnancy, blood tests for diabetes will be done along with other tests if problems develop.  Urine tests to check for infections, diabetes, or protein in the urine.  An ultrasound to confirm the proper growth  and development of the baby.  An amniocentesis to check for possible genetic problems.  Fetal screens for spina bifida and Down syndrome.  You may need other tests to make sure you and the baby are doing well.  HIV (human immunodeficiency virus) testing. Routine prenatal testing includes screening for HIV, unless you choose not to have this test. HOME CARE INSTRUCTIONS  Medicines  Follow your health care provider's instructions regarding medicine use. Specific medicines may be either safe or unsafe to take during pregnancy.  Take your prenatal vitamins as directed.  If you develop constipation, try taking a stool softener if your health care provider approves. Diet  Eat regular, well-balanced meals. Choose a variety of foods, such as meat or vegetable-based protein, fish, milk and low-fat dairy products, vegetables, fruits, and whole grain breads and cereals. Your health care provider will help you determine the amount of weight gain that is right for you.  Avoid raw meat and uncooked cheese. These carry germs that can cause birth defects in the baby.  Eating four or five small meals rather than three large meals a day may help relieve nausea and vomiting. If you start to feel nauseous, eating a few soda crackers can be helpful. Drinking liquids between meals instead of during meals also seems to help nausea and vomiting.  If you develop constipation, eat more high-fiber foods, such as fresh vegetables or fruit and whole grains. Drink enough fluids to keep your urine clear or pale yellow. Activity and Exercise  Exercise only as directed by your health care provider. Exercising will help you:  Control your weight.  Stay in shape.  Be prepared for labor and delivery.  Experiencing pain or cramping in the lower abdomen or low back is a good sign that you should stop exercising. Check with your health care provider before continuing normal exercises.  Try to avoid standing for long  periods of time. Move your legs often if you must stand in one place for a long time.  Avoid heavy lifting.  Wear low-heeled shoes, and practice good posture.  You may continue to have sex unless your health care provider directs you otherwise. Relief of Pain or Discomfort  Wear a good support bra for breast tenderness.   Take warm sitz baths to soothe any pain or discomfort caused by hemorrhoids. Use hemorrhoid cream if your health care provider approves.   Rest with your legs elevated if you have leg cramps or low back pain.  If you develop varicose veins in your legs, wear support hose. Elevate your feet for 15 minutes, 3-4 times a day. Limit salt in your diet. Prenatal Care  Schedule your prenatal visits by the twelfth week of pregnancy. They are usually scheduled monthly at first, then more often in the last 2 months before delivery.  Write down your questions. Take them to your prenatal visits.  Keep all your prenatal visits as directed by your   health care provider. Safety  Wear your seat belt at all times when driving.  Make a list of emergency phone numbers, including numbers for family, friends, the hospital, and police and fire departments. General Tips  Ask your health care provider for a referral to a local prenatal education class. Begin classes no later than at the beginning of month 6 of your pregnancy.  Ask for help if you have counseling or nutritional needs during pregnancy. Your health care provider can offer advice or refer you to specialists for help with various needs.  Do not use hot tubs, steam rooms, or saunas.  Do not douche or use tampons or scented sanitary pads.  Do not cross your legs for long periods of time.  Avoid cat litter boxes and soil used by cats. These carry germs that can cause birth defects in the baby and possibly loss of the fetus by miscarriage or stillbirth.  Avoid all smoking, herbs, alcohol, and medicines not prescribed by  your health care provider. Chemicals in these affect the formation and growth of the baby.  Do not use any tobacco products, including cigarettes, chewing tobacco, and electronic cigarettes. If you need help quitting, ask your health care provider. You may receive counseling support and other resources to help you quit.  Schedule a dentist appointment. At home, brush your teeth with a soft toothbrush and be gentle when you floss. SEEK MEDICAL CARE IF:   You have dizziness.  You have mild pelvic cramps, pelvic pressure, or nagging pain in the abdominal area.  You have persistent nausea, vomiting, or diarrhea.  You have a bad smelling vaginal discharge.  You have pain with urination.  You notice increased swelling in your face, hands, legs, or ankles. SEEK IMMEDIATE MEDICAL CARE IF:   You have a fever.  You are leaking fluid from your vagina.  You have spotting or bleeding from your vagina.  You have severe abdominal cramping or pain.  You have rapid weight gain or loss.  You vomit blood or material that looks like coffee grounds.  You are exposed to [place] measles and have never had them.  You are exposed to fifth disease or chickenpox.  You develop a severe headache.  You have shortness of breath.  You have any kind of trauma, such as from a fall or a car accident.   This information is not intended to replace advice given to you by your health care provider. Make sure you discuss any questions you have with your health care provider.   Document Released: 04/27/2001 Document Revised: 05/24/2014 Document Reviewed: 03/13/2013 Elsevier Interactive Patient Education 2016 [employer] Dating [place] in 2 weeks STOP drinking alcohol and will get in to see MD for klonopin use

## 2015-03-18 NOTE — Progress Notes (Signed)
Subjective:     Patient ID: Patricia Richards, female   DOB: 1990-07-06, 24 y.o.   MRN: [id number]  HPI Patricia Richards is a 24 year old white female in for UPT, she has missed a period and has had 2 +HPT after 2 negative,ones.She has some cramping no bleeding.She admits to drinking alcohol often,but not daily, and takes klonopin off the street.She has history of panic attacks.She is new to this practice, has 2 boys.Blood type A-.  Review of Systems Patient denies any headaches, hearing loss, fatigue, blurred vision, shortness of breath, chest pain,  problems with bowel movements, urination, or intercourse. No joint pain or mood swings.See HPI for positives. Reviewed past medical,surgical, social and family history. Reviewed medications and allergies.     Objective:   Physical Exam BP 110/70 mmHg  Pulse 88  Ht 5\' 2"  (1.575 m)  Wt 108 lb (48.988 kg)  BMI 19.75 kg/m2  LMP 02/07/2015  Breastfeeding? No UPT +,about 5+4 weeks by LMP with EDD 11/14/15,medicaid form given,Skin warm and dry. Neck: mid line trachea, normal thyroid, good ROM, no lymphadenopathy noted. Lungs: clear to ausculation bilaterally. Cardiovascular: regular rate and rhythm.Abdomen soft, mildly tender, has tattoos and piercing's.Counselled over stopping alcohol,it is not good for the baby, and will get in to talk with Dr Elonda Husky about klonopin use.She voiced understanding but was nonchalant about it.    Assessment:     Pregnant  Alcohol abuse,?addiction to klonopin    Plan:     Return in 2 weeks for dating [place] and see Dr Elonda Husky about klonopin Review handout on first trimester Take prenatal gummies  Try to stop alcohol use

## 2015-04-01 ENCOUNTER — Ambulatory Visit (INDEPENDENT_AMBULATORY_CARE_PROVIDER_SITE_OTHER): Payer: Medicaid Other

## 2015-04-01 ENCOUNTER — Encounter: Payer: Self-pay | Admitting: Obstetrics & Gynecology

## 2015-04-01 ENCOUNTER — Ambulatory Visit (INDEPENDENT_AMBULATORY_CARE_PROVIDER_SITE_OTHER): Payer: Medicaid Other | Admitting: Obstetrics & Gynecology

## 2015-04-01 VITALS — BP 100/58 | HR 70 | Ht 62.0 in | Wt 107.0 lb

## 2015-04-01 DIAGNOSIS — F419 Anxiety disorder, unspecified: Secondary | ICD-10-CM | POA: Diagnosis not present

## 2015-04-01 DIAGNOSIS — O3680X0 Pregnancy with inconclusive fetal viability, not applicable or unspecified: Secondary | ICD-10-CM

## 2015-04-01 DIAGNOSIS — O0991 Supervision of high risk pregnancy, unspecified, first trimester: Secondary | ICD-10-CM | POA: Diagnosis not present

## 2015-04-01 DIAGNOSIS — Z3A01 Less than 8 weeks gestation of pregnancy: Secondary | ICD-10-CM

## 2015-04-01 DIAGNOSIS — F32A Depression, unspecified: Secondary | ICD-10-CM

## 2015-04-01 DIAGNOSIS — F329 Major depressive disorder, single episode, unspecified: Secondary | ICD-10-CM

## 2015-04-01 DIAGNOSIS — O99341 Other mental disorders complicating pregnancy, first trimester: Secondary | ICD-10-CM

## 2015-04-01 MED ORDER — ESCITALOPRAM OXALATE 10 MG PO TABS: 10.0000 mg | ORAL_TABLET | Freq: Every day | ORAL | Status: DC

## 2015-04-01 MED ORDER — CLONAZEPAM 1 MG PO TABS: 2.0000 mg | ORAL_TABLET | Freq: Two times a day (BID) | ORAL | Status: DC
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
## 2015-04-01 NOTE — Progress Notes (Signed)
[place] 6+5wks single IUP w/ys,pos FHT 129 bpm,normal ov's bilat,crl 9 mm

## 2015-04-15 ENCOUNTER — Encounter: Payer: Medicaid Other | Admitting: Women's Health

## 2015-04-15 ENCOUNTER — Encounter: Payer: Self-pay | Admitting: *Deleted

## 2015-04-21 ENCOUNTER — Telehealth: Payer: Self-pay | Admitting: Women's Health

## 2015-04-21 ENCOUNTER — Encounter: Payer: Medicaid Other | Admitting: Women's Health

## 2015-04-21 NOTE — Telephone Encounter (Signed)
Pt will need to be seen before we can address that issue

## 2015-04-21 NOTE — Telephone Encounter (Signed)
Pt given an appt here tomorrow with Dr.Eure to discuss refills on Klonopin and Lexapro.
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
## 2015-04-22 ENCOUNTER — Encounter: Payer: Self-pay | Admitting: Obstetrics & Gynecology

## 2015-04-22 ENCOUNTER — Ambulatory Visit (INDEPENDENT_AMBULATORY_CARE_PROVIDER_SITE_OTHER): Payer: Medicaid Other | Admitting: Obstetrics & Gynecology

## 2015-04-22 DIAGNOSIS — Z331 Pregnant state, incidental: Secondary | ICD-10-CM | POA: Diagnosis not present

## 2015-04-22 DIAGNOSIS — Z3A08 8 weeks gestation of pregnancy: Secondary | ICD-10-CM

## 2015-04-22 DIAGNOSIS — F329 Major depressive disorder, single episode, unspecified: Secondary | ICD-10-CM

## 2015-04-22 DIAGNOSIS — F419 Anxiety disorder, unspecified: Secondary | ICD-10-CM | POA: Diagnosis not present

## 2015-04-22 DIAGNOSIS — O0991 Supervision of high risk pregnancy, unspecified, first trimester: Secondary | ICD-10-CM | POA: Diagnosis not present

## 2015-04-22 DIAGNOSIS — Z1389 Encounter for screening for other disorder: Secondary | ICD-10-CM

## 2015-04-22 DIAGNOSIS — F32A Depression, unspecified: Secondary | ICD-10-CM

## 2015-04-22 LAB — POCT URINALYSIS DIPSTICK
Blood, UA: NEGATIVE
GLUCOSE UA: NEGATIVE
Ketones, UA: NEGATIVE
LEUKOCYTES UA: NEGATIVE
NITRITE UA: NEGATIVE
Protein, UA: NEGATIVE

## 2015-04-22 MED ORDER — CLONAZEPAM 1 MG PO TABS: 2.0000 mg | ORAL_TABLET | Freq: Two times a day (BID) | ORAL | Status: DC

## 2015-04-22 MED ORDER — ESCITALOPRAM OXALATE 10 MG PO TABS: 10.0000 mg | ORAL_TABLET | Freq: Every day | ORAL | Status: DC
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
## 2015-04-29 ENCOUNTER — Encounter: Payer: Medicaid Other | Admitting: Women's Health

## 2015-05-13 ENCOUNTER — Encounter: Payer: Self-pay | Admitting: Women's Health

## 2015-05-13 ENCOUNTER — Other Ambulatory Visit (HOSPITAL_COMMUNITY)
Admission: RE | Admit: 2015-05-13 | Discharge: 2015-05-13 | Disposition: A | Discharge status: Home / Self Care | Payer: Medicaid Other | Source: Ambulatory Visit | Attending: Obstetrics & Gynecology | Admitting: Obstetrics & Gynecology

## 2015-05-13 ENCOUNTER — Ambulatory Visit (INDEPENDENT_AMBULATORY_CARE_PROVIDER_SITE_OTHER): Payer: Medicaid Other | Admitting: Women's Health

## 2015-05-13 VITALS — BP 110/60 | HR 102 | Wt 106.5 lb

## 2015-05-13 DIAGNOSIS — F418 Other specified anxiety disorders: Secondary | ICD-10-CM

## 2015-05-13 DIAGNOSIS — O09899 Supervision of other high risk pregnancies, unspecified trimester: Secondary | ICD-10-CM | POA: Insufficient documentation

## 2015-05-13 DIAGNOSIS — Z3682 Encounter for antenatal screening for nuchal translucency: Secondary | ICD-10-CM

## 2015-05-13 DIAGNOSIS — Z8659 Personal history of other mental and behavioral disorders: Secondary | ICD-10-CM | POA: Insufficient documentation

## 2015-05-13 DIAGNOSIS — O99342 Other mental disorders complicating pregnancy, second trimester: Secondary | ICD-10-CM | POA: Diagnosis not present

## 2015-05-13 DIAGNOSIS — Z369 Encounter for antenatal screening, unspecified: Secondary | ICD-10-CM

## 2015-05-13 DIAGNOSIS — O99310 Alcohol use complicating pregnancy, unspecified trimester: Secondary | ICD-10-CM

## 2015-05-13 DIAGNOSIS — Z01411 Encounter for gynecological examination (general) (routine) with abnormal findings: Secondary | ICD-10-CM | POA: Diagnosis present

## 2015-05-13 DIAGNOSIS — O09891 Supervision of other high risk pregnancies, first trimester: Secondary | ICD-10-CM

## 2015-05-13 DIAGNOSIS — Z1389 Encounter for screening for other disorder: Secondary | ICD-10-CM | POA: Diagnosis not present

## 2015-05-13 DIAGNOSIS — O360110 Maternal care for anti-D [Rh] antibodies, first trimester, not applicable or unspecified: Secondary | ICD-10-CM | POA: Diagnosis not present

## 2015-05-13 DIAGNOSIS — Z0283 Encounter for blood-alcohol and blood-drug test: Secondary | ICD-10-CM

## 2015-05-13 DIAGNOSIS — Z1151 Encounter for screening for human papillomavirus (HPV): Secondary | ICD-10-CM | POA: Diagnosis not present

## 2015-05-13 DIAGNOSIS — Z331 Pregnant state, incidental: Secondary | ICD-10-CM | POA: Diagnosis not present

## 2015-05-13 DIAGNOSIS — R443 Hallucinations, unspecified: Secondary | ICD-10-CM | POA: Insufficient documentation

## 2015-05-13 DIAGNOSIS — Z3A13 13 weeks gestation of pregnancy: Secondary | ICD-10-CM | POA: Diagnosis not present

## 2015-05-13 DIAGNOSIS — Z113 Encounter for screening for infections with a predominantly sexual mode of transmission: Secondary | ICD-10-CM | POA: Diagnosis present

## 2015-05-13 LAB — POCT URINALYSIS DIPSTICK
Blood, UA: NEGATIVE
Glucose, UA: NEGATIVE
KETONES UA: NEGATIVE
LEUKOCYTES UA: NEGATIVE
Nitrite, UA: NEGATIVE
Protein, UA: NEGATIVE

## 2015-05-13 NOTE — Patient Instructions (Signed)
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
## 2015-05-14 ENCOUNTER — Ambulatory Visit (INDEPENDENT_AMBULATORY_CARE_PROVIDER_SITE_OTHER): Payer: Medicaid Other

## 2015-05-14 ENCOUNTER — Other Ambulatory Visit: Payer: Medicaid Other

## 2015-05-14 DIAGNOSIS — Z36 Encounter for antenatal screening of mother: Secondary | ICD-10-CM

## 2015-05-14 DIAGNOSIS — Z369 Encounter for antenatal screening, unspecified: Secondary | ICD-10-CM

## 2015-05-14 DIAGNOSIS — Z3A13 13 weeks gestation of pregnancy: Secondary | ICD-10-CM | POA: Diagnosis not present

## 2015-05-14 DIAGNOSIS — Z3682 Encounter for antenatal screening for nuchal translucency: Secondary | ICD-10-CM

## 2015-05-14 LAB — URINALYSIS, ROUTINE W REFLEX MICROSCOPIC
Bilirubin, UA: NEGATIVE
GLUCOSE, UA: NEGATIVE
KETONES UA: NEGATIVE
LEUKOCYTES UA: NEGATIVE
Nitrite, UA: NEGATIVE
PROTEIN UA: NEGATIVE
RBC, UA: NEGATIVE
SPEC GRAV UA: 1.019 (ref 1.005–1.030)
Urobilinogen, Ur: 0.2 mg/dL (ref 0.2–1.0)
pH, UA: 7.5 (ref 5.0–7.5)

## 2015-05-14 LAB — PMP SCREEN PROFILE (10S), URINE
AMPHETAMINE SCRN UR: NEGATIVE ng/mL
Barbiturate Screen, Ur: NEGATIVE ng/mL
Benzodiazepine Screen, Urine: NEGATIVE ng/mL
CANNABINOIDS UR QL SCN: NEGATIVE ng/mL
CREATININE(CRT), U: 95.7 mg/dL (ref 20.0–300.0)
Cocaine(Metab.)Screen, Urine: NEGATIVE ng/mL
Methadone Scn, Ur: NEGATIVE ng/mL
OPIATE SCRN UR: NEGATIVE ng/mL
OXYCODONE+OXYMORPHONE UR QL SCN: NEGATIVE ng/mL
PCP SCRN UR: NEGATIVE ng/mL
PH UR, DRUG SCRN: 8 (ref 4.5–8.9)
Propoxyphene, Screen: NEGATIVE ng/mL

## 2015-05-14 LAB — ANTIBODY SCREEN: ANTIBODY SCREEN: NEGATIVE

## 2015-05-14 LAB — ETHANOL: ETHANOL LVL: NEGATIVE %

## 2015-05-14 LAB — CBC
HEMOGLOBIN: 11 g/dL — AB (ref 11.1–15.9)
Hematocrit: 32.8 % — ABNORMAL LOW (ref 34.0–46.6)
MCH: 29.3 pg (ref 26.6–33.0)
MCHC: 33.5 g/dL (ref 31.5–35.7)
MCV: 88 fL (ref 79–97)
PLATELETS: 253 10*3/uL (ref 150–379)
RBC: 3.75 x10E6/uL — AB (ref 3.77–5.28)
RDW: 14.2 % (ref 12.3–15.4)
WBC: 6.7 10*3/uL (ref 3.4–10.8)

## 2015-05-14 LAB — VARICELLA ZOSTER ANTIBODY, IGG: Varicella zoster IgG: 484 index (ref >165)

## 2015-05-14 LAB — RPR: RPR: NONREACTIVE

## 2015-05-14 LAB — ABO/RH: Rh Factor: NEGATIVE

## 2015-05-14 LAB — RUBELLA SCREEN: Rubella Antibodies, IGG: 4.33 index (ref >0.99)

## 2015-05-14 LAB — HIV ANTIBODY (ROUTINE TESTING W REFLEX): HIV Screen 4th Generation wRfx: NONREACTIVE

## 2015-05-14 LAB — HEPATITIS B SURFACE ANTIGEN: HEP B S AG: NEGATIVE

## 2015-05-14 NOTE — Progress Notes (Signed)
US12+6wks,measurements c/w dates,normal ov's bilat,fhr 159 bpm,NB present,NT 1.7,CRL 69.91mm

## 2015-05-15 LAB — URINE CULTURE

## 2015-05-15 LAB — CYTOLOGY - PAP

## 2015-05-16 LAB — MATERNAL SCREEN, INTEGRATED #1
Crown Rump Length: 69.2 mm
GEST. AGE ON COLLECTION DATE: 13 wk
MATERNAL AGE AT EDD: 22.7 a
NUCHAL TRANSLUCENCY (NT): 1.7 mm
NUMBER OF FETUSES: 1
PAPP-A VALUE: 642.4 ng/mL
WEIGHT: 108 [lb_av]

## 2015-05-20 ENCOUNTER — Encounter: Payer: Self-pay | Admitting: Women's Health

## 2015-05-20 ENCOUNTER — Telehealth: Payer: Self-pay | Admitting: Women's Health

## 2015-05-20 DIAGNOSIS — R87619 Unspecified abnormal cytological findings in specimens from cervix uteri: Secondary | ICD-10-CM | POA: Insufficient documentation

## 2015-05-20 DIAGNOSIS — A749 Chlamydial infection, unspecified: Secondary | ICD-10-CM

## 2015-05-20 DIAGNOSIS — O09891 Supervision of other high risk pregnancies, first trimester: Secondary | ICD-10-CM

## 2015-05-20 DIAGNOSIS — O98811 Other maternal infectious and parasitic diseases complicating pregnancy, first trimester: Secondary | ICD-10-CM

## 2015-05-20 MED ORDER — AZITHROMYCIN 500 MG PO TABS: 1000.0000 mg | ORAL_TABLET | Freq: Once | ORAL | Status: DC

## 2015-05-20 NOTE — Telephone Encounter (Signed)
Called 1st number listed, VM box not set up, 2nd number listed wrong # per person who answered. Need to notify pt of +CT, rx sent to pharmacy, find out if want me to tx partner, also pap smear abnormal, needs colpo at next visit. Will send letter.  Roma Schanz, CNM, [facility] 05/20/2015 2:53 PM

## 2015-05-27 ENCOUNTER — Ambulatory Visit (INDEPENDENT_AMBULATORY_CARE_PROVIDER_SITE_OTHER): Payer: Medicaid Other | Admitting: Obstetrics & Gynecology

## 2015-05-27 ENCOUNTER — Encounter: Payer: Self-pay | Admitting: Obstetrics & Gynecology

## 2015-05-27 VITALS — BP 110/60 | HR 92 | Wt 113.0 lb

## 2015-05-27 DIAGNOSIS — Z331 Pregnant state, incidental: Secondary | ICD-10-CM

## 2015-05-27 DIAGNOSIS — Z1389 Encounter for screening for other disorder: Secondary | ICD-10-CM

## 2015-05-27 DIAGNOSIS — R896 Abnormal cytological findings in specimens from other organs, systems and tissues: Secondary | ICD-10-CM | POA: Diagnosis not present

## 2015-05-27 DIAGNOSIS — IMO0002 Reserved for concepts with insufficient information to code with codable children: Secondary | ICD-10-CM

## 2015-05-27 DIAGNOSIS — O09893 Supervision of other high risk pregnancies, third trimester: Secondary | ICD-10-CM

## 2015-05-27 LAB — POCT URINALYSIS DIPSTICK
Glucose, UA: NEGATIVE
KETONES UA: NEGATIVE
Leukocytes, UA: NEGATIVE
NITRITE UA: NEGATIVE
PROTEIN UA: NEGATIVE
RBC UA: NEGATIVE

## 2015-05-27 MED ORDER — CLINDAMYCIN PHOSPHATE 100 MG VA SUPP: 100.0000 mg | Freq: Every day | VAGINAL | Status: DC

## 2015-05-27 NOTE — Addendum Note (Signed)
Addended by: Florian Buff on: 05/27/2015 03:04 PM   Modules accepted: Orders

## 2015-05-27 NOTE — Progress Notes (Signed)
[id number] [redacted]w[redacted]d Estimated Date of Delivery: 11/20/15  Blood pressure 110/60, pulse 92, weight 113 lb (51.256 kg), last menstrual period 02/07/2015, unknown if currently breastfeeding.   BP weight and urine results all reviewed and noted.  Please refer to the obstetrical flow sheet for the fundal height and fetal heart rate documentation:  Patient reports good fetal movement, denies any bleeding and no rupture of membranes symptoms or regular contractions. Patient is without complaints. All questions were answered.  Orders Placed This Encounter  Procedures  . POCT urinalysis dipstick    Plan:  Continued routine obstetrical care, see colpo note below  No Follow-up on file.   Colposcopy Procedure Note:  Colposcopy Procedure Note  Indications: Pap smear 1 months ago showed: ASCUS with POSITIVE high risk HPV. The prior pap showed ASCUS with POSITIVE high risk HPV.  Prior cervical/vaginal disease: . Prior cervical treatment: .  Smoker:  Yes.   New sexual partner:  No.  : time frame:    History of abnormal Pap: yes  Procedure Details  The risks and benefits of the procedure and Written informed consent obtained.  Speculum placed in vagina and excellent visualization of cervix achieved, cervix swabbed x 3 with acetic acid solution.  Findings: Cervix: visible lesion(s) at 2 o'clock, acetowhite lesion(s) noted at 2 o'clock and punctation noted at 2 o'clock; no biopsies taken., pregnant Vaginal inspection: normal without visible lesions. Vulvar colposcopy: vulvar colposcopy not performed.  Specimens: none  Complications: none.  Plan: Repeat colpscopy at 6-8 weeks postpartum

## 2015-05-28 ENCOUNTER — Telehealth: Payer: Self-pay | Admitting: Women's Health

## 2015-05-28 NOTE — Telephone Encounter (Signed)
Pt states that she got a letter that she was given an antibiotic for chlamydia. Pt is aware that Rx has been sent to the pharmacy.

## 2015-06-10 ENCOUNTER — Ambulatory Visit (INDEPENDENT_AMBULATORY_CARE_PROVIDER_SITE_OTHER): Payer: Medicaid Other | Admitting: Obstetrics & Gynecology

## 2015-06-10 ENCOUNTER — Encounter: Payer: Self-pay | Admitting: Obstetrics & Gynecology

## 2015-06-10 VITALS — BP 100/60 | HR 80 | Wt 113.0 lb

## 2015-06-10 DIAGNOSIS — O09892 Supervision of other high risk pregnancies, second trimester: Secondary | ICD-10-CM

## 2015-06-10 DIAGNOSIS — Z3A17 17 weeks gestation of pregnancy: Secondary | ICD-10-CM | POA: Diagnosis not present

## 2015-06-10 DIAGNOSIS — Z331 Pregnant state, incidental: Secondary | ICD-10-CM

## 2015-06-10 DIAGNOSIS — Z1389 Encounter for screening for other disorder: Secondary | ICD-10-CM

## 2015-06-10 DIAGNOSIS — Z3492 Encounter for supervision of normal pregnancy, unspecified, second trimester: Secondary | ICD-10-CM

## 2015-06-10 DIAGNOSIS — O99322 Drug use complicating pregnancy, second trimester: Secondary | ICD-10-CM | POA: Diagnosis not present

## 2015-06-10 DIAGNOSIS — Z3682 Encounter for antenatal screening for nuchal translucency: Secondary | ICD-10-CM

## 2015-06-10 DIAGNOSIS — O98312 Other infections with a predominantly sexual mode of transmission complicating pregnancy, second trimester: Secondary | ICD-10-CM

## 2015-06-10 DIAGNOSIS — O360110 Maternal care for anti-D [Rh] antibodies, first trimester, not applicable or unspecified: Secondary | ICD-10-CM

## 2015-06-10 LAB — POCT URINALYSIS DIPSTICK
GLUCOSE UA: NEGATIVE
KETONES UA: NEGATIVE
Leukocytes, UA: NEGATIVE
Nitrite, UA: NEGATIVE
PROTEIN UA: NEGATIVE
RBC UA: NEGATIVE

## 2015-06-10 MED ORDER — CLONAZEPAM 1 MG PO TABS: 1.0000 mg | ORAL_TABLET | Freq: Two times a day (BID) | ORAL | Status: DC

## 2015-06-10 MED ORDER — AZITHROMYCIN 500 MG PO TABS: 1000.0000 mg | ORAL_TABLET | Freq: Once | ORAL | Status: DC

## 2015-06-12 LAB — MATERNAL SCREEN, INTEGRATED #2
ADSF: 1
AFP MoM: 0.86
Alpha-Fetoprotein: 35.1 ng/mL
CROWN RUMP LENGTH: 69.2 mm
DIA MOM: 0.82
DIA VALUE: 174 pg/mL
Estriol, Unconjugated: 1.1 ng/mL
Gest. Age on Collection Date: 13 weeks
Gestational Age: 16.9 weeks
Maternal Age at EDD: 22.7 years
NUCHAL TRANSLUCENCY (NT): 1.7 mm
NUCHAL TRANSLUCENCY MOM: 0.99
NUMBER OF FETUSES: 1
PAPP-A MoM: 0.37
PAPP-A VALUE: 642.4 ng/mL
TEST RESULTS: NEGATIVE
WEIGHT: 108 [lb_av]
Weight: 108 [lb_av]
hCG MoM: 1.42
hCG Value: 49.4 IU/mL

## 2015-06-16 ENCOUNTER — Other Ambulatory Visit: Payer: Self-pay | Admitting: Obstetrics & Gynecology

## 2015-06-16 ENCOUNTER — Telehealth: Payer: Self-pay | Admitting: *Deleted

## 2015-06-16 MED ORDER — FLUCONAZOLE 150 MG PO TABS: 150.0000 mg | ORAL_TABLET | Freq: Once | ORAL | Status: DC

## 2015-06-16 NOTE — Telephone Encounter (Signed)
Pt states just finished Azithromycin and thinks she now has yeast infection, c/o vaginal itching. Pt states she thinks it is yeast but has recently been diagnosed with BV. Please advise.

## 2015-06-18 ENCOUNTER — Other Ambulatory Visit: Payer: Self-pay | Admitting: Obstetrics & Gynecology

## 2015-06-18 NOTE — Telephone Encounter (Signed)
The prescription was e prescibed already.  Please look under medications and see that it was e prescribed on 06/16/2015

## 2015-06-30 ENCOUNTER — Other Ambulatory Visit: Payer: Self-pay | Admitting: Obstetrics & Gynecology

## 2015-06-30 DIAGNOSIS — Z1389 Encounter for screening for other disorder: Secondary | ICD-10-CM

## 2015-07-01 ENCOUNTER — Ambulatory Visit (INDEPENDENT_AMBULATORY_CARE_PROVIDER_SITE_OTHER): Payer: Medicaid Other

## 2015-07-01 ENCOUNTER — Ambulatory Visit (INDEPENDENT_AMBULATORY_CARE_PROVIDER_SITE_OTHER): Payer: Medicaid Other | Admitting: Advanced Practice Midwife

## 2015-07-01 ENCOUNTER — Encounter: Payer: Self-pay | Admitting: Advanced Practice Midwife

## 2015-07-01 VITALS — BP 100/60 | HR 89 | Wt 115.5 lb

## 2015-07-01 DIAGNOSIS — O99322 Drug use complicating pregnancy, second trimester: Secondary | ICD-10-CM | POA: Diagnosis not present

## 2015-07-01 DIAGNOSIS — O23592 Infection of other part of genital tract in pregnancy, second trimester: Secondary | ICD-10-CM

## 2015-07-01 DIAGNOSIS — Z3A20 20 weeks gestation of pregnancy: Secondary | ICD-10-CM

## 2015-07-01 DIAGNOSIS — Z36 Encounter for antenatal screening of mother: Secondary | ICD-10-CM | POA: Diagnosis not present

## 2015-07-01 DIAGNOSIS — O09892 Supervision of other high risk pregnancies, second trimester: Secondary | ICD-10-CM | POA: Diagnosis not present

## 2015-07-01 DIAGNOSIS — Z1389 Encounter for screening for other disorder: Secondary | ICD-10-CM | POA: Diagnosis not present

## 2015-07-01 DIAGNOSIS — O360110 Maternal care for anti-D [Rh] antibodies, first trimester, not applicable or unspecified: Secondary | ICD-10-CM

## 2015-07-01 DIAGNOSIS — Z331 Pregnant state, incidental: Secondary | ICD-10-CM | POA: Diagnosis not present

## 2015-07-01 DIAGNOSIS — Z369 Encounter for antenatal screening, unspecified: Secondary | ICD-10-CM

## 2015-07-01 DIAGNOSIS — O358XX0 Maternal care for other (suspected) fetal abnormality and damage, not applicable or unspecified: Secondary | ICD-10-CM

## 2015-07-01 DIAGNOSIS — O35EXX0 Maternal care for other (suspected) fetal abnormality and damage, fetal genitourinary anomalies, not applicable or unspecified: Secondary | ICD-10-CM | POA: Insufficient documentation

## 2015-07-01 DIAGNOSIS — IMO0002 Reserved for concepts with insufficient information to code with codable children: Secondary | ICD-10-CM

## 2015-07-01 LAB — POCT URINALYSIS DIPSTICK
Blood, UA: NEGATIVE
GLUCOSE UA: NEGATIVE
Ketones, UA: NEGATIVE
LEUKOCYTES UA: NEGATIVE
NITRITE UA: NEGATIVE
Protein, UA: NEGATIVE

## 2015-07-01 MED ORDER — TERCONAZOLE 0.4 % VA CREA: 1.0000 | TOPICAL_CREAM | Freq: Every day | VAGINAL | Status: DC

## 2015-07-01 NOTE — Patient Instructions (Addendum)
Pyelectasis is a dilation of the renal pelvis and may be unilateral or bilateral.  Pyelectasis is identified in approximately 2-3% of prenatal ultrasounds and is more common in males than females.  It can be a normal variant or can reflect a structural renal abnormality.  It can also be associated with other anomalies as part of a syndrome (e.g., chromosome abnormality).  When pyelectasis is apparently isolated, there may be an indication for genetic screening.  YOUR GENETIC SCREENING WAS NEGATIVE.  Prenatally diagnosed cases of pyelectasis may resolve prenatally or in the neonatal period, remain stable, or progresse m to more significant renal involvement, including hydronephrosis and renal failure. We will recheck around 26-28 weeks.

## 2015-07-01 NOTE — Progress Notes (Signed)
Fetal Surveillance Testing today:  [place]   High Risk Pregnancy Diagnosis(es):   Illicit drug use, severe anxiety  [id number] [redacted]w[redacted]d Estimated Date of Delivery: 11/20/15  Blood pressure 100/60, pulse 89, weight 115 lb 8 oz (52.39 kg), last menstrual period 02/07/2015, unknown if currently breastfeeding.  Urinalysis: Negative   HPI: The patient is being seen today for ongoing management of pregnancy. She is only using her prescribed meds, doing better.  Maudry Diego is still undecided about adoption. Praying on it.  If he doesn't adopt, she will keep the baby herself.  Boyfriend (FOB of 1st child) doesn't want to even talk about the pregnancy since he isn't the FOB.  Pt's dad is giving her pushback about adoption.  Hasn't gone to therapy in a few months.   Today she reports vaginal itch/discharge.  She took her meds for chl 3 weeks ago, boyfriend about 2 weeks ago, but has been having unprotected sex with him.  Bleeds after sex.    SSE:  Thick white dc Wet prep:  Yeast, some WBC's, no clue or trich    BP weight and urine results all reviewed and noted. Patient reports some fetal movement, denies any bleeding and no rupture of membranes symptoms or regular contractions.   Patient is without complaints other than noted in her HPI. All questions were answered.  All lab and sonogram results have been reviewed. Comments:  [place] 19+5 wks,measurements c/w dates,cx 3.9cm,ant pl gr 0, normal ov's bilat,fhr 155 bpm,svp of fluid 5.9cm,bilat pyelectasis,RT renal pelvis 4.65mm,LT renal pelvis 4.48mm,cephalic,efw 123456 g,anatomy complete   Assessment:  1.  Pregnancy at [redacted]w[redacted]d,  Estimated Date of Delivery: 11/20/15 :                          2.  Anxiey/depression                         Medication(s) Plans:  Continue Klonopin 2mg  BID and Lexapro 10mg  daily.  Rx terazol for yeast  Treatment Plan:  Restart therapy--discussed at length and pt promises she will schedule an appointment;  Recheck kidneys 28 weeks  Return in about  4 weeks (around 07/29/2015) for [place]. for appointment for high risk OB care  Meds ordered this encounter  Medications  . Prenatal Vit-Fe Fumarate-FA (MULTIVITAMIN-PRENATAL) 27-0.8 MG TABS tablet    Sig: Take 1 tablet by mouth daily at 12 noon.   Orders Placed This Encounter  Procedures  . Pain Management Screening Profile (10S)  . POCT urinalysis dipstick

## 2015-07-01 NOTE — Progress Notes (Signed)
Pt states that she is still having a discharge and she doesn't think the yeast infection went away.

## 2015-07-01 NOTE — Progress Notes (Signed)
[place] 19+5 wks,measurements c/w dates,cx 3.9cm,ant pl gr 0, normal ov's bilat,fhr 155 bpm,svp of fluid 5.9cm,bilat pyelectasis,RT renal pelvis 4.62mm,LT renal pelvis 4.4mm,cephalic,efw 123456 g,anatomy complete

## 2015-07-02 LAB — PMP SCREEN PROFILE (10S), URINE
Amphetamine Screen, Ur: NEGATIVE ng/mL
BARBITURATE SCRN UR: NEGATIVE ng/mL
BENZODIAZEPINE SCREEN, URINE: NEGATIVE ng/mL
CANNABINOIDS UR QL SCN: NEGATIVE ng/mL
CREATININE(CRT), U: 57.8 mg/dL (ref 20.0–300.0)
Cocaine(Metab.)Screen, Urine: NEGATIVE ng/mL
Methadone Scn, Ur: NEGATIVE ng/mL
OPIATE SCRN UR: NEGATIVE ng/mL
Oxycodone+Oxymorphone Ur Ql Scn: NEGATIVE ng/mL
PCP Scrn, Ur: NEGATIVE ng/mL
Ph of Urine: 6.1 (ref 4.5–8.9)
Propoxyphene, Screen: NEGATIVE ng/mL

## 2015-07-29 ENCOUNTER — Encounter: Payer: Self-pay | Admitting: Obstetrics & Gynecology

## 2015-07-29 ENCOUNTER — Ambulatory Visit (INDEPENDENT_AMBULATORY_CARE_PROVIDER_SITE_OTHER): Payer: Medicaid Other | Admitting: Obstetrics & Gynecology

## 2015-07-29 VITALS — BP 100/60 | HR 102 | Wt 123.0 lb

## 2015-07-29 DIAGNOSIS — Z3492 Encounter for supervision of normal pregnancy, unspecified, second trimester: Secondary | ICD-10-CM

## 2015-07-29 DIAGNOSIS — Z331 Pregnant state, incidental: Secondary | ICD-10-CM | POA: Diagnosis not present

## 2015-07-29 DIAGNOSIS — Z1389 Encounter for screening for other disorder: Secondary | ICD-10-CM | POA: Diagnosis not present

## 2015-07-29 DIAGNOSIS — O99322 Drug use complicating pregnancy, second trimester: Secondary | ICD-10-CM

## 2015-07-29 DIAGNOSIS — O360110 Maternal care for anti-D [Rh] antibodies, first trimester, not applicable or unspecified: Secondary | ICD-10-CM | POA: Diagnosis not present

## 2015-07-29 DIAGNOSIS — O99310 Alcohol use complicating pregnancy, unspecified trimester: Secondary | ICD-10-CM

## 2015-07-29 DIAGNOSIS — Z3A26 26 weeks gestation of pregnancy: Secondary | ICD-10-CM

## 2015-07-29 DIAGNOSIS — O0992 Supervision of high risk pregnancy, unspecified, second trimester: Secondary | ICD-10-CM | POA: Diagnosis not present

## 2015-07-29 DIAGNOSIS — O99342 Other mental disorders complicating pregnancy, second trimester: Secondary | ICD-10-CM | POA: Diagnosis not present

## 2015-07-29 LAB — POCT URINALYSIS DIPSTICK
Blood, UA: NEGATIVE
Glucose, UA: NEGATIVE
KETONES UA: NEGATIVE
LEUKOCYTES UA: NEGATIVE
Nitrite, UA: NEGATIVE
PROTEIN UA: NEGATIVE

## 2015-07-29 MED ORDER — CLONAZEPAM 1 MG PO TABS: 1.0000 mg | ORAL_TABLET | Freq: Two times a day (BID) | ORAL | Status: DC

## 2015-07-29 MED ORDER — ESCITALOPRAM OXALATE 10 MG PO TABS: 10.0000 mg | ORAL_TABLET | Freq: Every day | ORAL | Status: DC

## 2015-07-29 NOTE — Progress Notes (Signed)
[id number] [redacted]w[redacted]d Estimated Date of Delivery: 11/20/15  Blood pressure 100/60, pulse 102, weight 123 lb (55.792 kg), last menstrual period 02/07/2015, unknown if currently breastfeeding.   BP weight and urine results all reviewed and noted.  Please refer to the obstetrical flow sheet for the fundal height and fetal heart rate documentation:  Patient reports good fetal movement, denies any bleeding and no rupture of membranes symptoms or regular contractions. Patient is without complaints. All questions were answered.  Orders Placed This Encounter  Procedures  . POCT urinalysis dipstick    Plan:  Continued routine obstetrical care, continue klonipin, lexapro  Return in about 4 weeks (around 08/26/2015) for LROB, PN2.

## 2015-07-30 ENCOUNTER — Telehealth: Payer: Self-pay | Admitting: Obstetrics and Gynecology

## 2015-07-30 NOTE — Telephone Encounter (Signed)
Summary of several calls: Pt allegedly needs an EARLY REFIL of Klonipin , which is written for 1 mg bid.  Pt Pharmacy documents that she has received 60 tabs of Kloniipin 1 mg on 2/24, and received an "emergency " refil of 8 tabs., an alleged 2 day supply, on 3/13, and she needs an early refil TODAY.(3/15). Pt denies selling or sharing meds , and doesn't know of any stolen pills. Pt alleges she has never taken 4 pills in a day, reluctantly acknowleges having used 3 tabs in a day "once or twice",  And has no clear explanation for 68 pills being used in 19 days.  I have authorized an early refil of Klonipin 1 mg, x 30 tabs, 2 week supply, with 3 refils. Pharmacist will fill the Rx the pt has in her possession in 30-pill increments. Pt is aware that we will be monitoring pill use going forward.

## 2015-08-15 ENCOUNTER — Encounter (HOSPITAL_COMMUNITY): Payer: Self-pay | Admitting: *Deleted

## 2015-08-15 ENCOUNTER — Emergency Department (HOSPITAL_COMMUNITY)
Admission: EM | Admit: 2015-08-15 | Discharge: 2015-08-16 | Disposition: A | Discharge status: Home / Self Care | Payer: Medicaid Other | Attending: Emergency Medicine | Admitting: Emergency Medicine

## 2015-08-15 DIAGNOSIS — Z79899 Other long term (current) drug therapy: Secondary | ICD-10-CM | POA: Diagnosis not present

## 2015-08-15 DIAGNOSIS — R109 Unspecified abdominal pain: Secondary | ICD-10-CM

## 2015-08-15 DIAGNOSIS — O9989 Other specified diseases and conditions complicating pregnancy, childbirth and the puerperium: Secondary | ICD-10-CM | POA: Insufficient documentation

## 2015-08-15 DIAGNOSIS — Z87891 Personal history of nicotine dependence: Secondary | ICD-10-CM | POA: Diagnosis not present

## 2015-08-15 DIAGNOSIS — R1011 Right upper quadrant pain: Secondary | ICD-10-CM | POA: Diagnosis not present

## 2015-08-15 DIAGNOSIS — O26899 Other specified pregnancy related conditions, unspecified trimester: Secondary | ICD-10-CM

## 2015-08-15 DIAGNOSIS — R1013 Epigastric pain: Secondary | ICD-10-CM | POA: Insufficient documentation

## 2015-08-15 DIAGNOSIS — Z3A26 26 weeks gestation of pregnancy: Secondary | ICD-10-CM | POA: Insufficient documentation
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
## 2015-08-15 NOTE — ED Notes (Signed)
Pt reports abdominal pain to the right side of her rib cage down to the right lower quadrant. Pt states she can feel baby moving at present. Pt also says she is nauseated.

## 2015-08-16 LAB — CBC WITH DIFFERENTIAL/PLATELET
Basophils Absolute: 0 10*3/uL (ref 0.0–0.1)
Basophils Relative: 0 %
EOS ABS: 0 10*3/uL (ref 0.0–0.7)
EOS PCT: 0 %
HCT: 28.8 % — ABNORMAL LOW (ref 36.0–46.0)
HEMOGLOBIN: 9.7 g/dL — AB (ref 12.0–15.0)
LYMPHS ABS: 1.3 10*3/uL (ref 0.7–4.0)
Lymphocytes Relative: 14 %
MCH: 30.6 pg (ref 26.0–34.0)
MCHC: 33.7 g/dL (ref 30.0–36.0)
MCV: 90.9 fL (ref 78.0–100.0)
MONOS PCT: 6 %
Monocytes Absolute: 0.5 10*3/uL (ref 0.1–1.0)
Neutro Abs: 7.3 10*3/uL (ref 1.7–7.7)
Neutrophils Relative %: 80 %
PLATELETS: 220 10*3/uL (ref 150–400)
RBC: 3.17 MIL/uL — ABNORMAL LOW (ref 3.87–5.11)
RDW: 13.2 % (ref 11.5–15.5)
WBC: 9.2 10*3/uL (ref 4.0–10.5)

## 2015-08-16 LAB — URINALYSIS, ROUTINE W REFLEX MICROSCOPIC
BILIRUBIN URINE: NEGATIVE
Glucose, UA: NEGATIVE mg/dL
HGB URINE DIPSTICK: NEGATIVE
KETONES UR: NEGATIVE mg/dL
LEUKOCYTES UA: NEGATIVE
NITRITE: NEGATIVE
PH: 7 (ref 5.0–8.0)
Protein, ur: NEGATIVE mg/dL
Specific Gravity, Urine: 1.01 (ref 1.005–1.030)

## 2015-08-16 LAB — WET PREP, GENITAL
SPERM: NONE SEEN
TRICH WET PREP: NONE SEEN
Yeast Wet Prep HPF POC: NONE SEEN

## 2015-08-16 LAB — COMPREHENSIVE METABOLIC PANEL
ALK PHOS: 51 U/L (ref 38–126)
ALT: 10 U/L — ABNORMAL LOW (ref 14–54)
ANION GAP: 6 (ref 5–15)
AST: 14 U/L — ABNORMAL LOW (ref 15–41)
Albumin: 3.1 g/dL — ABNORMAL LOW (ref 3.5–5.0)
BUN: 6 mg/dL (ref 6–20)
CALCIUM: 8.1 mg/dL — AB (ref 8.9–10.3)
CO2: 23 mmol/L (ref 22–32)
Chloride: 104 mmol/L (ref 101–111)
Creatinine, Ser: 0.41 mg/dL — ABNORMAL LOW (ref 0.44–1.00)
GFR calc non Af Amer: >60 mL/min (ref >60)
Glucose, Bld: 88 mg/dL (ref 65–99)
Potassium: 3.4 mmol/L — ABNORMAL LOW (ref 3.5–5.1)
SODIUM: 133 mmol/L — AB (ref 135–145)
Total Bilirubin: 0.4 mg/dL (ref 0.3–1.2)
Total Protein: 6.5 g/dL (ref 6.5–8.1)

## 2015-08-16 MED ORDER — ACETAMINOPHEN 500 MG PO TABS
1000.0000 mg | ORAL_TABLET | Freq: Once | ORAL | Status: AC
  Administered 2015-08-16: 1000 mg via ORAL
  Filled 2015-08-16: qty 2

## 2015-08-16 MED ORDER — ONDANSETRON 4 MG PO TBDP
4.0000 mg | ORAL_TABLET | Freq: Once | ORAL | Status: AC
  Administered 2015-08-16: 4 mg via ORAL
  Filled 2015-08-16: qty 1

## 2015-08-16 NOTE — ED Notes (Signed)
Lab at the bedside 

## 2015-08-16 NOTE — ED Notes (Signed)
Patient given discharge instruction, verbalized understand. Patient ambulatory out of the department.  
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
## 2015-08-16 NOTE — Progress Notes (Signed)
Pt is a G4P2 @ 26wk2d. She is a patient of [facility]. Complaining of abdominal pain. Fetal heart tone 150bpm with moderate variability when tracing visible (sporadic, intermitent tracing). Requested that fetal monitor be adjusted. No contractions seen.

## 2015-08-16 NOTE — Discharge Instructions (Signed)
You may take Tylenol 1000 mg every 6 hours as needed for pain. If you develop worsening abdominal pain, vomiting and cannot stop, bloody in your stool or black and tarry stools, do not feel your baby move for more than 6 hours, begin leaking fluid from your vagina, have vaginal bleeding or any other symptoms concerning to you, please follow-up in the emergency department. You've a follow-up with your PCP and OB/GYN for further management. Your labs, urine, pelvic exam was normal today. Gonorrhea, chlamydia cultures are pending.   Abdominal Pain, Adult Many things can cause abdominal pain. Usually, abdominal pain is not caused by a disease and will improve without treatment. It can often be observed and treated at home. Your health care provider will do a physical exam and possibly order blood tests and X-rays to help determine the seriousness of your pain. However, in many cases, more time must pass before a clear cause of the pain can be found. Before that point, your health care provider may not know if you need more testing or further treatment. HOME CARE INSTRUCTIONS Monitor your abdominal pain for any changes. The following actions may help to alleviate any discomfort you are experiencing:  Only take over-the-counter or prescription medicines as directed by your health care provider.  Do not take laxatives unless directed to do so by your health care provider.  Try a clear liquid diet (broth, tea, or water) as directed by your health care provider. Slowly move to a bland diet as tolerated. SEEK MEDICAL CARE IF:  You have unexplained abdominal pain.  You have abdominal pain associated with nausea or diarrhea.  You have pain when you urinate or have a bowel movement.  You experience abdominal pain that wakes you in the night.  You have abdominal pain that is worsened or improved by eating food.  You have abdominal pain that is worsened with eating fatty foods.  You have a fever. SEEK  IMMEDIATE MEDICAL CARE IF:  Your pain does not go away within 2 hours.  You keep throwing up (vomiting).  Your pain is felt only in portions of the abdomen, such as the right side or the left lower portion of the abdomen.  You pass bloody or black tarry stools. MAKE SURE YOU:  Understand these instructions.  Will watch your condition.  Will get help right away if you are not doing well or get worse.   This information is not intended to replace advice given to you by your health care provider. Make sure you discuss any questions you have with your health care provider.   Document Released: 02/10/2005 Document Revised: 01/22/2015 Document Reviewed: 01/10/2013 Elsevier Interactive Patient Education [employer].

## 2015-08-18 LAB — GC/CHLAMYDIA PROBE AMP (~~LOC~~) NOT AT ARMC
CHLAMYDIA, DNA PROBE: POSITIVE — AB
NEISSERIA GONORRHEA: NEGATIVE

## 2015-08-19 ENCOUNTER — Telehealth: Payer: Self-pay | Admitting: *Deleted

## 2015-08-19 ENCOUNTER — Telehealth: Payer: Self-pay | Admitting: Obstetrics & Gynecology

## 2015-08-19 DIAGNOSIS — O98811 Other maternal infectious and parasitic diseases complicating pregnancy, first trimester: Principal | ICD-10-CM

## 2015-08-19 DIAGNOSIS — A749 Chlamydial infection, unspecified: Secondary | ICD-10-CM

## 2015-08-19 NOTE — Telephone Encounter (Signed)
Pt states had a positive Chlamydia result from 08/16/2015 ED visit. Pt states the ED did not treat her for the Chlamydia. Pt informed message routed to Knute Neu, CNM.   No sex until POC/3 weeks, appt scheduled for 09/11/2015 for POC.

## 2015-08-19 NOTE — Telephone Encounter (Signed)
Patricia Richards with the [facility] Department states pt had a positive Chlamydia result from ED on 08/16/2015, however pt was not treated. Langley Gauss is sending fax and pt to make an appt to discuss with provider treatment etc.

## 2015-08-20 MED ORDER — AZITHROMYCIN 500 MG PO TABS: 1000.0000 mg | ORAL_TABLET | Freq: Once | ORAL | Status: DC
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
## 2015-08-21 ENCOUNTER — Telehealth (HOSPITAL_BASED_OUTPATIENT_CLINIC_OR_DEPARTMENT_OTHER): Payer: Self-pay | Admitting: Emergency Medicine

## 2015-08-21 NOTE — Telephone Encounter (Signed)
Post ED Visit - Positive Culture Follow-up: Successful Patient Follow-Up  Culture assessed and recommendations reviewed by: []  Elenor Quinones, Pharm.D. []  Heide Guile, Pharm.D., BCPS []  Parks Neptune, Pharm.D. []  Alycia Rossetti, Pharm.D., BCPS []  [place], Pharm.D., BCPS, AAHIVP []  Legrand Como, Pharm.D., BCPS, AAHIVP []  Milus Glazier, Pharm.D. []  Stephens November, Pharm.D.  Positive chlamydia  [x]  Patient discharged without antimicrobial prescription and treatment is now indicated []  Organism is resistant to prescribed ED discharge antimicrobial []  Patient with positive blood cultures  Changes discussed with ED provider: Blanchie Dessert PA New antibiotic prescription Azithromycin 1000mg  po x 1  Attempting to contact patient   Hazle Nordmann 08/21/2015, 4:57 PM

## 2015-08-26 ENCOUNTER — Ambulatory Visit (INDEPENDENT_AMBULATORY_CARE_PROVIDER_SITE_OTHER): Payer: Medicaid Other | Admitting: Women's Health

## 2015-08-26 ENCOUNTER — Other Ambulatory Visit (INDEPENDENT_AMBULATORY_CARE_PROVIDER_SITE_OTHER): Payer: Medicaid Other

## 2015-08-26 ENCOUNTER — Other Ambulatory Visit: Payer: Medicaid Other

## 2015-08-26 VITALS — BP 100/48 | HR 88 | Wt 128.0 lb

## 2015-08-26 DIAGNOSIS — Z131 Encounter for screening for diabetes mellitus: Secondary | ICD-10-CM

## 2015-08-26 DIAGNOSIS — Z369 Encounter for antenatal screening, unspecified: Secondary | ICD-10-CM

## 2015-08-26 DIAGNOSIS — O99313 Alcohol use complicating pregnancy, third trimester: Secondary | ICD-10-CM

## 2015-08-26 DIAGNOSIS — O283 Abnormal ultrasonic finding on antenatal screening of mother: Secondary | ICD-10-CM

## 2015-08-26 DIAGNOSIS — O09892 Supervision of other high risk pregnancies, second trimester: Secondary | ICD-10-CM

## 2015-08-26 DIAGNOSIS — Z331 Pregnant state, incidental: Secondary | ICD-10-CM | POA: Diagnosis not present

## 2015-08-26 DIAGNOSIS — N898 Other specified noninflammatory disorders of vagina: Secondary | ICD-10-CM

## 2015-08-26 DIAGNOSIS — O35EXX0 Maternal care for other (suspected) fetal abnormality and damage, fetal genitourinary anomalies, not applicable or unspecified: Secondary | ICD-10-CM

## 2015-08-26 DIAGNOSIS — Z0283 Encounter for blood-alcohol and blood-drug test: Secondary | ICD-10-CM

## 2015-08-26 DIAGNOSIS — Z1389 Encounter for screening for other disorder: Secondary | ICD-10-CM

## 2015-08-26 DIAGNOSIS — O358XX0 Maternal care for other (suspected) fetal abnormality and damage, not applicable or unspecified: Secondary | ICD-10-CM

## 2015-08-26 DIAGNOSIS — O26892 Other specified pregnancy related conditions, second trimester: Secondary | ICD-10-CM

## 2015-08-26 DIAGNOSIS — Z3A28 28 weeks gestation of pregnancy: Secondary | ICD-10-CM

## 2015-08-26 DIAGNOSIS — Z0189 Encounter for other specified special examinations: Secondary | ICD-10-CM

## 2015-08-26 LAB — POCT URINALYSIS DIPSTICK
GLUCOSE UA: NEGATIVE
Ketones, UA: NEGATIVE
Leukocytes, UA: NEGATIVE
NITRITE UA: NEGATIVE
Protein, UA: NEGATIVE
RBC UA: NEGATIVE

## 2015-08-26 LAB — POCT WET PREP (WET MOUNT): Clue Cells Wet Prep Whiff POC: NEGATIVE

## 2015-08-26 NOTE — Progress Notes (Signed)
[place] [id number] wks,cephalic,efw [id number] g [id number] 22 cm,ant pl 0,cx 3.9cm,normal ov's bilat,kidneys wnl

## 2015-08-26 NOTE — Patient Instructions (Signed)
[facility]  Call the office [phone]) or go to [facility] if:  You begin to have strong, frequent contractions  Your water breaks.  Sometimes it is a big gush of fluid, sometimes it is just a trickle that keeps getting your panties wet or running down your legs  You have vaginal bleeding.  It is normal to have a small amount of spotting if your cervix was checked.   You don't feel your baby moving like normal.  If you don't, get you something to eat and drink and lay down and focus on feeling your baby move.  You should feel at least 10 movements in 2 hours.  If you don't, you should call the office or go to [facility].    Tdap Vaccine  It is recommended that you get the Tdap vaccine during the third trimester of EACH pregnancy to help protect your baby from getting pertussis (whooping cough)  27-36 weeks is the BEST time to do this so that you can pass the protection on to your baby. During pregnancy is better than after pregnancy, but if you are unable to get it during pregnancy it will be offered at the hospital.   You can get this vaccine at the health department or your family doctor  Everyone who will be around your baby should also be up-to-date on their vaccines. Adults (who are not pregnant) only need 1 dose of Tdap during adulthood.   Third Trimester of Pregnancy The third trimester is from week 29 through week 42, months 7 through 9. The third trimester is a time when the fetus is growing rapidly. At the end of the ninth month, the fetus is about 20 inches in length and weighs 6-10 pounds.  BODY CHANGES Your body goes through many changes during pregnancy. The changes vary from woman to woman.   Your weight will continue to increase. You can expect to gain 25-35 pounds (11-16 kg) by the end of the pregnancy.  You may begin to get stretch marks on your hips, abdomen, and breasts.  You may urinate  more often because the fetus is moving lower into your pelvis and pressing on your bladder.  You may develop or continue to have heartburn as a result of your pregnancy.  You may develop constipation because certain hormones are causing the muscles that push waste through your intestines to slow down.  You may develop hemorrhoids or swollen, bulging veins (varicose veins).  You may have pelvic pain because of the weight gain and pregnancy hormones relaxing your joints between the bones in your pelvis. Backaches may result from overexertion of the muscles supporting your posture.  You may have changes in your hair. These can include thickening of your hair, rapid growth, and changes in texture. Some women also have hair loss during or after pregnancy, or hair that feels dry or thin. Your hair will most likely return to normal after your baby is born.  Your breasts will continue to grow and be tender. A yellow discharge may leak from your breasts called colostrum.  Your belly button may stick out.  You may feel short of breath because of your expanding uterus.  You may notice the fetus "dropping," or moving lower in your abdomen.  You may have a bloody mucus discharge. This usually occurs a few days to a week before labor begins.  Your cervix becomes thin and soft (effaced) near  your due date. WHAT TO EXPECT AT YOUR PRENATAL EXAMS  You will have prenatal exams every 2 weeks until week 36. Then, you will have weekly prenatal exams. During a routine prenatal visit:  You will be weighed to make sure you and the fetus are growing normally.  Your blood pressure is taken.  Your abdomen will be measured to track your baby's growth.  The fetal heartbeat will be listened to.  Any test results from the previous visit will be discussed.  You may have a cervical check near your due date to see if you have effaced. At around 36 weeks, your caregiver will check your cervix. At the same time,  your caregiver will also perform a test on the secretions of the vaginal tissue. This test is to determine if a type of bacteria, Group B streptococcus, is present. Your caregiver will explain this further. Your caregiver may ask you:  What your birth plan is.  How you are feeling.  If you are feeling the baby move.  If you have had any abnormal symptoms, such as leaking fluid, bleeding, severe headaches, or abdominal cramping.  If you have any questions. Other tests or screenings that may be performed during your third trimester include:  Blood tests that check for low iron levels (anemia).  Fetal testing to check the health, activity level, and growth of the fetus. Testing is done if you have certain medical conditions or if there are problems during the pregnancy. FALSE LABOR You may feel small, irregular contractions that eventually go away. These are called Braxton Hicks contractions, or false labor. Contractions may last for hours, days, or even weeks before true labor sets in. If contractions come at regular intervals, intensify, or become painful, it is best to be seen by your caregiver.  SIGNS OF LABOR   Menstrual-like cramps.  Contractions that are 5 minutes apart or less.  Contractions that start on the top of the uterus and spread down to the lower abdomen and back.  A sense of increased pelvic pressure or back pain.  A watery or bloody mucus discharge that comes from the vagina. If you have any of these signs before the 37th week of pregnancy, call your caregiver right away. You need to go to the hospital to get checked immediately. HOME CARE INSTRUCTIONS   Avoid all smoking, herbs, alcohol, and unprescribed drugs. These chemicals affect the formation and growth of the baby.  Follow your caregiver's instructions regarding medicine use. There are medicines that are either safe or unsafe to take during pregnancy.  Exercise only as directed by your caregiver.  Experiencing uterine cramps is a good sign to stop exercising.  Continue to eat regular, healthy meals.  Wear a good support bra for breast tenderness.  Do not use hot tubs, steam rooms, or saunas.  Wear your seat belt at all times when driving.  Avoid raw meat, uncooked cheese, cat litter boxes, and soil used by cats. These carry germs that can cause birth defects in the baby.  Take your prenatal vitamins.  Try taking a stool softener (if your caregiver approves) if you develop constipation. Eat more high-fiber foods, such as fresh vegetables or fruit and whole grains. Drink plenty of fluids to keep your urine clear or pale yellow.  Take warm sitz baths to soothe any pain or discomfort caused by hemorrhoids. Use hemorrhoid cream if your caregiver approves.  If you develop varicose veins, wear support hose. Elevate your feet for 15 minutes, 3-4  times a day. Limit salt in your diet.  Avoid heavy lifting, wear low heal shoes, and practice good posture.  Rest a lot with your legs elevated if you have leg cramps or low back pain.  Visit your dentist if you have not gone during your pregnancy. Use a soft toothbrush to brush your teeth and be gentle when you floss.  A sexual relationship may be continued unless your caregiver directs you otherwise.  Do not travel far distances unless it is absolutely necessary and only with the approval of your caregiver.  Take prenatal classes to understand, practice, and ask questions about the labor and delivery.  Make a trial run to the hospital.  Pack your hospital bag.  Prepare the baby's nursery.  Continue to go to all your prenatal visits as directed by your caregiver. SEEK MEDICAL CARE IF:  You are unsure if you are in labor or if your water has broken.  You have dizziness.  You have mild pelvic cramps, pelvic pressure, or nagging pain in your abdominal area.  You have persistent nausea, vomiting, or diarrhea.  You have a bad  smelling vaginal discharge.  You have pain with urination. SEEK IMMEDIATE MEDICAL CARE IF:   You have a fever.  You are leaking fluid from your vagina.  You have spotting or bleeding from your vagina.  You have severe abdominal cramping or pain.  You have rapid weight loss or gain.  You have shortness of breath with chest pain.  You notice sudden or extreme swelling of your face, hands, ankles, feet, or legs.  You have not felt your baby move in over an hour.  You have severe headaches that do not go away with medicine.  You have vision changes. Document Released: 04/27/2001 Document Revised: 05/08/2013 Document Reviewed: 07/04/2012 [facility] Patient Information 2015 [place], [place]. This information is not intended to replace advice given to you by your health care provider. Make sure you discuss any questions you have with your health care provider.
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
## 2015-08-27 LAB — RPR: RPR Ser Ql: NONREACTIVE

## 2015-08-27 LAB — PMP SCREEN PROFILE (10S), URINE
Amphetamine Screen, Ur: NEGATIVE ng/mL
BARBITURATE SCRN UR: NEGATIVE ng/mL
BENZODIAZEPINE SCREEN, URINE: NEGATIVE ng/mL
COCAINE(METAB.) SCREEN, URINE: NEGATIVE ng/mL
Cannabinoids Ur Ql Scn: NEGATIVE ng/mL
Creatinine(Crt), U: 86.9 mg/dL (ref 20.0–300.0)
Methadone Scn, Ur: NEGATIVE ng/mL
OPIATE SCRN UR: NEGATIVE ng/mL
Oxycodone+Oxymorphone Ur Ql Scn: NEGATIVE ng/mL
PCP Scrn, Ur: NEGATIVE ng/mL
Ph of Urine: 6.4 (ref 4.5–8.9)
Propoxyphene, Screen: NEGATIVE ng/mL

## 2015-08-27 LAB — ETHANOL: Ethanol: NEGATIVE %

## 2015-08-27 LAB — CBC
HEMATOCRIT: 29.4 % — AB (ref 34.0–46.6)
HEMOGLOBIN: 9.6 g/dL — AB (ref 11.1–15.9)
MCH: 29.7 pg (ref 26.6–33.0)
MCHC: 32.7 g/dL (ref 31.5–35.7)
MCV: 91 fL (ref 79–97)
Platelets: 242 10*3/uL (ref 150–379)
RBC: 3.23 x10E6/uL — ABNORMAL LOW (ref 3.77–5.28)
RDW: 13.4 % (ref 12.3–15.4)
WBC: 9 10*3/uL (ref 3.4–10.8)

## 2015-08-27 LAB — ANTIBODY SCREEN: ANTIBODY SCREEN: NEGATIVE

## 2015-08-27 LAB — HIV ANTIBODY (ROUTINE TESTING W REFLEX): HIV SCREEN 4TH GENERATION: NONREACTIVE

## 2015-08-27 LAB — GLUCOSE TOLERANCE, 2 HOURS W/ 1HR
GLUCOSE, 2 HOUR: 151 mg/dL (ref 65–152)
GLUCOSE, FASTING: 81 mg/dL (ref 65–91)
Glucose, 1 hour: 129 mg/dL (ref 65–179)

## 2015-09-08 ENCOUNTER — Telehealth: Payer: Self-pay | Admitting: *Deleted

## 2015-09-08 NOTE — Telephone Encounter (Signed)
Johnnette Litter from the [facility] Dept requesting when was pt treated for + CT. Informed Rx was e-scribed on 04/05, however per Knute Neu, CNM documentation from 08/26/2015 encounter pt has not got the Rx due to cost. Langley Gauss states will contact pt and make them aware can be treated at the [facility] Department.

## 2015-09-11 ENCOUNTER — Ambulatory Visit (INDEPENDENT_AMBULATORY_CARE_PROVIDER_SITE_OTHER): Payer: Medicaid Other | Admitting: Advanced Practice Midwife

## 2015-09-11 ENCOUNTER — Encounter: Payer: Self-pay | Admitting: Advanced Practice Midwife

## 2015-09-11 VITALS — BP 100/60 | HR 96 | Wt 131.5 lb

## 2015-09-11 DIAGNOSIS — Z3A30 30 weeks gestation of pregnancy: Secondary | ICD-10-CM | POA: Diagnosis not present

## 2015-09-11 DIAGNOSIS — O358XX0 Maternal care for other (suspected) fetal abnormality and damage, not applicable or unspecified: Secondary | ICD-10-CM

## 2015-09-11 DIAGNOSIS — Z1389 Encounter for screening for other disorder: Secondary | ICD-10-CM

## 2015-09-11 DIAGNOSIS — Z331 Pregnant state, incidental: Secondary | ICD-10-CM | POA: Diagnosis not present

## 2015-09-11 DIAGNOSIS — O98811 Other maternal infectious and parasitic diseases complicating pregnancy, first trimester: Secondary | ICD-10-CM

## 2015-09-11 DIAGNOSIS — O99313 Alcohol use complicating pregnancy, third trimester: Secondary | ICD-10-CM

## 2015-09-11 DIAGNOSIS — O360130 Maternal care for anti-D [Rh] antibodies, third trimester, not applicable or unspecified: Secondary | ICD-10-CM | POA: Diagnosis not present

## 2015-09-11 DIAGNOSIS — O09893 Supervision of other high risk pregnancies, third trimester: Secondary | ICD-10-CM | POA: Diagnosis not present

## 2015-09-11 DIAGNOSIS — A749 Chlamydial infection, unspecified: Secondary | ICD-10-CM

## 2015-09-11 DIAGNOSIS — O35EXX0 Maternal care for other (suspected) fetal abnormality and damage, fetal genitourinary anomalies, not applicable or unspecified: Secondary | ICD-10-CM

## 2015-09-11 DIAGNOSIS — O98311 Other infections with a predominantly sexual mode of transmission complicating pregnancy, first trimester: Secondary | ICD-10-CM | POA: Diagnosis not present

## 2015-09-11 LAB — POCT URINALYSIS DIPSTICK
Blood, UA: NEGATIVE
GLUCOSE UA: NEGATIVE
Ketones, UA: NEGATIVE
NITRITE UA: NEGATIVE

## 2015-09-11 MED ORDER — LIDOCAINE VISCOUS 2 % MT SOLN: OROMUCOSAL | Status: DC

## 2015-09-11 NOTE — Progress Notes (Signed)
Fetal Surveillance Testing today:  doppler   High Risk Pregnancy Diagnosis(es):   ETOH use  [id number] [redacted]w[redacted]d Estimated Date of Delivery: 11/20/15  Blood pressure 100/60, pulse 96, weight 131 lb 8 oz (59.648 kg), last menstrual period 02/07/2015, unknown if currently breastfeeding.  Urinalysis: Negative  HPI: The patient is being seen today for ongoing management of the above. Today she reports picking up her rx for azithromycin until 4/14.  Has vaginal itching/irritaiton. Has had several yeast infections.  She denies using ETOH since last visit.  Saw a dentis earlier in the week--stated "she was going to give me vicodin 5s, but didn't since I was pregnant."  States dentist recommended extraction, but would make her wait until after delivery. Tooth not abscessed, no antibiotics rxd.    BP weight and urine results all reviewed and noted. Patient reports good fetal movement, denies any bleeding and no rupture of membranes symptoms or regular contractions.  Fundal Height:  30 Fetal Heart rate:  140 Edema:  No SSE:  Heavy white dc c/w yeast  Patient is without complaints other than noted in her HPI. All questions were answered.  All lab and sonogram results have been reviewed. Comments:    Assessment:  1.  Pregnancy at [redacted]w[redacted]d,  Estimated Date of Delivery: 11/20/15 :                          2.  ETOH use in pregnnacy, difficult to assess how much                        3.  Overuse of klonopin (see note from 3/14, [facility]).   4.  Tooth pain   5. Vaginal yeast   Medication(s) Plans:  Continue Klonopin 1mg  BID (rx for #60 w/1 rf written by [facility] on 3/14--will not need new rx until 5/14--J[facility] suggests writing for 2 week supplies.  Rx viscous lidocaine for tooth--given form to take to dentist that allows extractions.  To get OTC yeast med.   Treatment Plan:  Continue bimonthly visits for now  Return in about 2 weeks (around 09/25/2015) for HROB and POC. for appointment for high risk OB care 5 Meds ordered  this encounter  Medications  . lidocaine (XYLOCAINE) 2 % solution    Sig: Apply a small amount to affected tooth as needed for pain    Dispense:  50 mL    Refill:  0    Order Specific Question:  Supervising Provider    Answer:  Tania Ade H [[id number]]   Orders Placed This Encounter  Procedures  . POCT Urinalysis Dipstick

## 2015-09-16 ENCOUNTER — Encounter: Payer: Medicaid Other | Admitting: Advanced Practice Midwife

## 2015-09-17 ENCOUNTER — Telehealth: Payer: Self-pay | Admitting: Emergency Medicine

## 2015-09-17 NOTE — Telephone Encounter (Signed)
Lost to followup 

## 2015-09-19 ENCOUNTER — Telehealth: Payer: Self-pay | Admitting: Obstetrics & Gynecology

## 2015-09-19 NOTE — Telephone Encounter (Signed)
THIS PATIENT CALLED AND STATED SHE WAS SEEN IN [facility] ED A "FEW DAYS AGO".  WAS TOLD SHE HAD A KIDNEY STONE.  SHE STATES SHE IS IN SEVERE PAIN.  I SPOKE WITH CRYSTAL AND SHE ADVISED THE PATIENT TO GO TO [place] (AS SHE IS [redacted] WEEKS PREGNANT) AND BE SEEN THERE.  I HAVE INFORMED THE PATIENT AND SHE ASKED IF SHE COULD GO TO [place], I TOLD HER NO THAT SHE NEEDS TO GO TO [place].  SHE UNDERSTOOD. Darreld Mclean

## 2015-09-25 ENCOUNTER — Ambulatory Visit (INDEPENDENT_AMBULATORY_CARE_PROVIDER_SITE_OTHER): Payer: Medicaid Other | Admitting: Advanced Practice Midwife

## 2015-09-25 VITALS — BP 106/58 | HR 92 | Wt 129.8 lb

## 2015-09-25 DIAGNOSIS — Z1159 Encounter for screening for other viral diseases: Secondary | ICD-10-CM

## 2015-09-25 DIAGNOSIS — Z331 Pregnant state, incidental: Secondary | ICD-10-CM | POA: Diagnosis not present

## 2015-09-25 DIAGNOSIS — O99322 Drug use complicating pregnancy, second trimester: Secondary | ICD-10-CM

## 2015-09-25 DIAGNOSIS — O99342 Other mental disorders complicating pregnancy, second trimester: Secondary | ICD-10-CM | POA: Diagnosis not present

## 2015-09-25 DIAGNOSIS — Z3A32 32 weeks gestation of pregnancy: Secondary | ICD-10-CM | POA: Diagnosis not present

## 2015-09-25 DIAGNOSIS — O09893 Supervision of other high risk pregnancies, third trimester: Secondary | ICD-10-CM

## 2015-09-25 DIAGNOSIS — Z118 Encounter for screening for other infectious and parasitic diseases: Secondary | ICD-10-CM

## 2015-09-25 DIAGNOSIS — Z6791 Unspecified blood type, Rh negative: Secondary | ICD-10-CM

## 2015-09-25 DIAGNOSIS — Z1389 Encounter for screening for other disorder: Secondary | ICD-10-CM

## 2015-09-25 DIAGNOSIS — O0992 Supervision of high risk pregnancy, unspecified, second trimester: Secondary | ICD-10-CM | POA: Diagnosis not present

## 2015-09-25 DIAGNOSIS — O360110 Maternal care for anti-D [Rh] antibodies, first trimester, not applicable or unspecified: Secondary | ICD-10-CM | POA: Diagnosis not present

## 2015-09-25 DIAGNOSIS — R809 Proteinuria, unspecified: Secondary | ICD-10-CM

## 2015-09-25 LAB — POCT URINALYSIS DIPSTICK
GLUCOSE UA: NEGATIVE
KETONES UA: NEGATIVE
Nitrite, UA: NEGATIVE
RBC UA: NEGATIVE

## 2015-09-25 MED ORDER — CLONAZEPAM 1 MG PO TABS: 1.0000 mg | ORAL_TABLET | Freq: Two times a day (BID) | ORAL | Status: DC

## 2015-09-25 MED ORDER — RHO D IMMUNE GLOBULIN 1500 UNIT/2ML IJ SOSY
300.0000 ug | PREFILLED_SYRINGE | Freq: Once | INTRAMUSCULAR | Status: AC
  Administered 2015-09-25: 300 ug via INTRAMUSCULAR

## 2015-09-25 NOTE — Progress Notes (Signed)
Fetal Surveillance Testing today:  Doppler   High Risk Pregnancy Diagnosis(es):   ETOH use  [id number] [redacted]w[redacted]d Estimated Date of Delivery: 11/20/15  Blood pressure 106/58, pulse 92, weight 129 lb 12 oz (58.854 kg), last menstrual period 02/07/2015, unknown if currently breastfeeding.  Urinalysis: Positive for +1 protein. Denies UTI sx.  Says was dx with kidney stone in [place] a week ago based on sx. Feels better now, but still has back/flank pain.    HPI: The patient is being seen today for ongoing management of the above. Today she reports no ETOH use   BP weight and urine results all reviewed and noted. Patient reports good fetal movement, denies any bleeding and no rupture of membranes symptoms or regular contractions.  Fundal Height:  32 Fetal Heart rate:  140 Edema:  no  Patient is without complaints other than noted in her HPI. All questions were answered.  All lab and sonogram results have been reviewed. Comments:    Assessment:  1.  Pregnancy at [redacted]w[redacted]d,  Estimated Date of Delivery: 11/20/15 :                          2.  ETOH use, none per pt                        3.    Medication(s) Plans:  Klonopin 1mg  BID # 30 w/3 RF (2 week supply)  Treatment Plan:  q 2 week visits until 36 weeks, then weekly  Return in about 2 weeks (around 10/09/2015) for [place]. for appointment for high risk OB care  Meds ordered this encounter  Medications  . rho (d) immune globulin (RHIG/RHOPHYLAC) injection 300 mcg    Sig:   . clonazePAM (KLONOPIN) 1 MG tablet    Sig: Take 1 tablet (1 mg total) by mouth 2 (two) times daily.    Dispense:  30 tablet    Refill:  3    2 week supply    Order Specific Question:  Supervising Provider    Answer:  Elonda Husky, LUTHER H [[id number]]  . cyclobenzaprine (FLEXERIL) 10 MG tablet    Sig: Take 10 mg by mouth 3 (three) times daily as needed for muscle spasms.   Orders Placed This Encounter  Procedures  . GC/Chlamydia Probe Amp  . Urine culture  . POCT urinalysis  dipstick

## 2015-09-27 LAB — URINE CULTURE

## 2015-09-27 LAB — GC/CHLAMYDIA PROBE AMP
CHLAMYDIA, DNA PROBE: NEGATIVE
Neisseria gonorrhoeae by PCR: NEGATIVE

## 2015-09-29 ENCOUNTER — Telehealth: Payer: Self-pay | Admitting: Advanced Practice Midwife

## 2015-09-29 NOTE — Telephone Encounter (Signed)
Spoke with pt letting her know GC/CHL was negative. Pt voiced understanding. [place]

## 2015-10-09 ENCOUNTER — Encounter: Payer: Self-pay | Admitting: Obstetrics & Gynecology

## 2015-10-09 ENCOUNTER — Ambulatory Visit (INDEPENDENT_AMBULATORY_CARE_PROVIDER_SITE_OTHER): Payer: Medicaid Other | Admitting: Obstetrics & Gynecology

## 2015-10-09 VITALS — BP 100/60 | HR 90 | Wt 130.3 lb

## 2015-10-09 DIAGNOSIS — Z331 Pregnant state, incidental: Secondary | ICD-10-CM | POA: Diagnosis not present

## 2015-10-09 DIAGNOSIS — O360131 Maternal care for anti-D [Rh] antibodies, third trimester, fetus 1: Secondary | ICD-10-CM | POA: Diagnosis not present

## 2015-10-09 DIAGNOSIS — O99343 Other mental disorders complicating pregnancy, third trimester: Secondary | ICD-10-CM | POA: Diagnosis not present

## 2015-10-09 DIAGNOSIS — O99313 Alcohol use complicating pregnancy, third trimester: Secondary | ICD-10-CM | POA: Diagnosis not present

## 2015-10-09 DIAGNOSIS — O09893 Supervision of other high risk pregnancies, third trimester: Secondary | ICD-10-CM

## 2015-10-09 DIAGNOSIS — O99310 Alcohol use complicating pregnancy, unspecified trimester: Secondary | ICD-10-CM

## 2015-10-09 DIAGNOSIS — Z3A34 34 weeks gestation of pregnancy: Secondary | ICD-10-CM | POA: Diagnosis not present

## 2015-10-09 DIAGNOSIS — Z1389 Encounter for screening for other disorder: Secondary | ICD-10-CM

## 2015-10-09 DIAGNOSIS — F1010 Alcohol abuse, uncomplicated: Secondary | ICD-10-CM

## 2015-10-09 LAB — POCT URINALYSIS DIPSTICK
Blood, UA: NEGATIVE
GLUCOSE UA: NEGATIVE
Ketones, UA: NEGATIVE
LEUKOCYTES UA: NEGATIVE
NITRITE UA: NEGATIVE

## 2015-10-09 MED ORDER — CLONAZEPAM 1 MG PO TABS: 1.0000 mg | ORAL_TABLET | Freq: Two times a day (BID) | ORAL | Status: DC

## 2015-10-09 NOTE — Progress Notes (Signed)
Fetal Surveillance Testing today:  FHR   High Risk Pregnancy Diagnosis(es):   EtOH, Benzodiazepene use due to hallucinations/panic  [id number] [redacted]w[redacted]d Estimated Date of Delivery: 11/20/15  Blood pressure 100/60, pulse 90, weight 130 lb 4.8 oz (59.104 kg), last menstrual period 02/07/2015, unknown if currently breastfeeding.  Urinalysis: Negative   HPI: The patient is being seen today for ongoing management of as above. Today she reports no alcohol use, sticking to her klonipen plan of 2 tabs daily   BP weight and urine results all reviewed and noted. Patient reports good fetal movement, denies any bleeding and no rupture of membranes symptoms or regular contractions.  Fundal Height:  32 Fetal Heart rate:  144 Edema:  none  Patient is without complaints other than noted in her HPI. All questions were answered.  All lab and sonogram results have been reviewed. Comments: abnormal:    Assessment:  1.  Pregnancy at [redacted]w[redacted]d,  Estimated Date of Delivery: 11/20/15 :                          2.  EtOH                        3.  klonipen use  Medication(s) Plans:  Cont klonipen 1 mg BID  Treatment Plan:  Sonogram to check fetal weight  No Follow-up on file. for appointment for high risk OB care  No orders of the defined types were placed in this encounter.   Orders Placed This Encounter  Procedures  . POCT urinalysis dipstick

## 2015-10-23 ENCOUNTER — Encounter: Payer: Self-pay | Admitting: Obstetrics & Gynecology

## 2015-10-23 ENCOUNTER — Ambulatory Visit (INDEPENDENT_AMBULATORY_CARE_PROVIDER_SITE_OTHER): Payer: Medicaid Other

## 2015-10-23 ENCOUNTER — Ambulatory Visit (INDEPENDENT_AMBULATORY_CARE_PROVIDER_SITE_OTHER): Payer: Medicaid Other | Admitting: Obstetrics & Gynecology

## 2015-10-23 VITALS — BP 100/60 | HR 84 | Wt 136.0 lb

## 2015-10-23 DIAGNOSIS — O3663X1 Maternal care for excessive fetal growth, third trimester, fetus 1: Secondary | ICD-10-CM

## 2015-10-23 DIAGNOSIS — O09893 Supervision of other high risk pregnancies, third trimester: Secondary | ICD-10-CM

## 2015-10-23 DIAGNOSIS — Z3A36 36 weeks gestation of pregnancy: Secondary | ICD-10-CM

## 2015-10-23 DIAGNOSIS — O358XX0 Maternal care for other (suspected) fetal abnormality and damage, not applicable or unspecified: Secondary | ICD-10-CM

## 2015-10-23 DIAGNOSIS — Z331 Pregnant state, incidental: Secondary | ICD-10-CM | POA: Diagnosis not present

## 2015-10-23 DIAGNOSIS — O99323 Drug use complicating pregnancy, third trimester: Secondary | ICD-10-CM | POA: Diagnosis not present

## 2015-10-23 DIAGNOSIS — F1010 Alcohol abuse, uncomplicated: Secondary | ICD-10-CM

## 2015-10-23 DIAGNOSIS — O99310 Alcohol use complicating pregnancy, unspecified trimester: Secondary | ICD-10-CM

## 2015-10-23 DIAGNOSIS — Z1389 Encounter for screening for other disorder: Secondary | ICD-10-CM

## 2015-10-23 DIAGNOSIS — O35EXX0 Maternal care for other (suspected) fetal abnormality and damage, fetal genitourinary anomalies, not applicable or unspecified: Secondary | ICD-10-CM

## 2015-10-23 LAB — POCT URINALYSIS DIPSTICK
Blood, UA: NEGATIVE
GLUCOSE UA: NEGATIVE
Ketones, UA: NEGATIVE
Leukocytes, UA: NEGATIVE
NITRITE UA: NEGATIVE
Protein, UA: NEGATIVE

## 2015-10-23 NOTE — Progress Notes (Signed)
[place] 36 wks,cephalic,ant pl gr 1,afi [id number] cm,normal ov's bilat,fhr 156 bpm,right renal pelvis 5.8 mm,LK pyelectasis 8.64mm,EFW 3867 >90%

## 2015-10-23 NOTE — Progress Notes (Signed)
Fetal Surveillance Testing today:  Sonogram:suspected fetal macrosomia   High Risk Pregnancy Diagnosis(es):   Substance abuse  [id number] [redacted]w[redacted]d Estimated Date of Delivery: 11/20/15  Blood pressure 100/60, pulse 84, weight 136 lb (61.689 kg), last menstrual period 02/07/2015, unknown if currently breastfeeding.  Urinalysis: Negative   HPI: The patient is being seen today for ongoing management of as above. Today she reports no problems   BP weight and urine results all reviewed and noted. Patient reports good fetal movement, denies any bleeding and no rupture of membranes symptoms or regular contractions.  Fundal Height:  36 Fetal Heart rate:  156 Edema:  none  Patient is without complaints other than noted in her HPI. All questions were answered.  All lab and sonogram results have been reviewed. Comments:    Assessment:  1.  Pregnancy at [redacted]w[redacted]d,  Estimated Date of Delivery: 11/20/15 :                          2.  Substance abuse                        3.  Suspected fetal macrosomia, normal 2 hour  Medication(s) Plans:  Continue her prescribed meds  Treatment Plan:  Weekly visits  No Follow-up on file. for appointment for high risk OB care  Meds ordered this encounter  Medications  . iron polysaccharides (NIFEREX) 150 MG capsule    Sig: Take 150 mg by mouth daily.   Orders Placed This Encounter  Procedures  . POCT urinalysis dipstick

## 2015-10-27 ENCOUNTER — Inpatient Hospital Stay (HOSPITAL_COMMUNITY)
Admission: AD | Admit: 2015-10-27 | Discharge: 2015-10-27 | Disposition: A | Discharge status: Home / Self Care | Payer: Medicaid Other | Source: Ambulatory Visit | Attending: Obstetrics and Gynecology | Admitting: Obstetrics and Gynecology

## 2015-10-27 ENCOUNTER — Telehealth: Payer: Self-pay | Admitting: *Deleted

## 2015-10-27 ENCOUNTER — Encounter (HOSPITAL_COMMUNITY): Payer: Self-pay | Admitting: *Deleted

## 2015-10-27 DIAGNOSIS — O358XX0 Maternal care for other (suspected) fetal abnormality and damage, not applicable or unspecified: Secondary | ICD-10-CM

## 2015-10-27 DIAGNOSIS — O09893 Supervision of other high risk pregnancies, third trimester: Secondary | ICD-10-CM

## 2015-10-27 DIAGNOSIS — Z3493 Encounter for supervision of normal pregnancy, unspecified, third trimester: Secondary | ICD-10-CM | POA: Diagnosis present

## 2015-10-27 DIAGNOSIS — O35EXX0 Maternal care for other (suspected) fetal abnormality and damage, fetal genitourinary anomalies, not applicable or unspecified: Secondary | ICD-10-CM

## 2015-10-27 HISTORY — DX: Depression, unspecified: F32.A

## 2015-10-27 HISTORY — DX: Unspecified infectious disease: B99.9

## 2015-10-27 HISTORY — DX: Unspecified abnormal cytological findings in specimens from vagina: R87.629

## 2015-10-27 HISTORY — DX: Major depressive disorder, single episode, unspecified: F32.9

## 2015-10-27 HISTORY — DX: Calculus of kidney: N20.0

## 2015-10-27 NOTE — MAU Note (Addendum)
Had some brownish bleeding today when used restroom.  Increased pelvic pressure and back pain.  Feels like pelvic bone is going to break

## 2015-10-27 NOTE — MAU Note (Signed)
Urine in lab 

## 2015-10-27 NOTE — Telephone Encounter (Signed)
Pt c/o "bad pelvic pressure,old blood when wiping and once noticed clot in toilet, decrease in fetal movement, constipation. Pt advised to go to [facility] for evaluation now. Pt verbalized understanding.

## 2015-10-28 ENCOUNTER — Ambulatory Visit (INDEPENDENT_AMBULATORY_CARE_PROVIDER_SITE_OTHER): Payer: Medicaid Other | Admitting: Advanced Practice Midwife

## 2015-10-28 ENCOUNTER — Telehealth: Payer: Self-pay | Admitting: *Deleted

## 2015-10-28 VITALS — BP 98/50 | HR 72 | Wt 135.0 lb

## 2015-10-28 DIAGNOSIS — O99313 Alcohol use complicating pregnancy, third trimester: Secondary | ICD-10-CM

## 2015-10-28 DIAGNOSIS — Z3A37 37 weeks gestation of pregnancy: Secondary | ICD-10-CM | POA: Diagnosis not present

## 2015-10-28 DIAGNOSIS — O09893 Supervision of other high risk pregnancies, third trimester: Secondary | ICD-10-CM

## 2015-10-28 DIAGNOSIS — F418 Other specified anxiety disorders: Secondary | ICD-10-CM

## 2015-10-28 DIAGNOSIS — Z331 Pregnant state, incidental: Secondary | ICD-10-CM | POA: Diagnosis not present

## 2015-10-28 DIAGNOSIS — O3663X1 Maternal care for excessive fetal growth, third trimester, fetus 1: Secondary | ICD-10-CM

## 2015-10-28 DIAGNOSIS — Z1389 Encounter for screening for other disorder: Secondary | ICD-10-CM

## 2015-10-28 DIAGNOSIS — Z3685 Encounter for antenatal screening for Streptococcus B: Secondary | ICD-10-CM

## 2015-10-28 DIAGNOSIS — Z369 Encounter for antenatal screening, unspecified: Secondary | ICD-10-CM

## 2015-10-28 LAB — POCT URINALYSIS DIPSTICK
Blood, UA: NEGATIVE
Glucose, UA: NEGATIVE
KETONES UA: NEGATIVE
LEUKOCYTES UA: NEGATIVE
NITRITE UA: NEGATIVE

## 2015-10-28 MED ORDER — OMEPRAZOLE 20 MG PO CPDR: 20.0000 mg | DELAYED_RELEASE_CAPSULE | Freq: Every day | ORAL | Status: DC

## 2015-10-28 NOTE — Telephone Encounter (Signed)
Pt called stating that she went to [facility] and they sent her home yesterday, she states that she is still having the pressure and the bloody show that she was having yesterday. Pt states that sfter leaving the hospital last night she started having palpitations and they continued this morning when she woke up. The pt called at 12:30 and I advised her that there was no provider here at this time to see her, and if the palpitations were bad she would need to go to [facility]. THe pt stated that she wasted her gas to go over there yesterday and they sent her home, and wanted to know if she could just go to [facility]. I advised the pt that she would need to go to [facility] and be evaluated. Pt persisted and was given an appointment at 1:30 to see a provider for evaluation. Pt was advised that if anything changed before her appointment to please go to [facility]. Pt verbalized understanding.

## 2015-10-28 NOTE — Progress Notes (Signed)
Fetal Surveillance Testing today:  doppler   High Risk Pregnancy Diagnosis(es):   Substance abuse  [id number] [redacted]w[redacted]d Estimated Date of Delivery: 11/20/15  Blood pressure 98/50, pulse 72, weight 135 lb (61.236 kg), last menstrual period 02/07/2015, unknown if currently breastfeeding.  Urinalysis: Negative   HPI: The patient is being seen today for ongoing management of the above.  Denies ETOH, taking Klonopin as rx'd. . Today she reports having several issues:  Feels "like my chest is bloated and gets cold" at times.  Belches a lot.  Also has palpitations.  C/O lots of pressure and "feels like baby is breaking my pelvis.". Went to [facility]U 2 days ago for same c/o.  Doesn't like either feeling, worried something is wrong with her or the baby.  Had an "episode' of the chest sx while in office. No SOB, difficulty talking, no change in anything.     BP weight and urine results all reviewed and noted. Patient reports good fetal movement, denies any bleeding and no rupture of membranes symptoms or regular contractions.  Fundal Height:  35 Fetal Heart rate:  150 Edema:  no  Patient is without complaints other than noted in her HPI. All questions were answered.  All lab and sonogram results have been reviewed. Comments:    Assessment:  1.  Pregnancy at [redacted]w[redacted]d,  Estimated Date of Delivery: 11/20/15 :                          2.  GI sx                        3.  palpitations  Medication(s) Plans:  prilosec 20 mg; continue klonopin 1mg  BID (has rx)  Treatment Plan:  Valsalva when palpitates;   Return in about 1 week (around 11/04/2015) for [place]. for appointment for high risk OB care  Meds ordered this encounter  Medications  . omeprazole (PRILOSEC) 20 MG capsule    Sig: Take 1 capsule (20 mg total) by mouth daily.    Dispense:  30 capsule    Refill:  1    Order Specific Question:  Supervising Provider    Answer:  Tania Ade H [[id number]]   Orders Placed This Encounter  Procedures  . Strep Gp B NAA   . GC/Chlamydia Probe Amp  . POCT urinalysis dipstick

## 2015-10-28 NOTE — Patient Instructions (Signed)
Vagal Maneuver Techniques  There are different types of vagal maneuvers that can be used to slow a person's heart rate. There is not one maneuver which will work for all patients. In some instances, it may take a little trial and error to determine what technique works best for an individual patient. All of the procedures or techniques below may stimulate the vagal nerve.  Bearing Down: Bearing down, which medically is referred to as the Valsalva maneuver, is one of the most common ways to stimulate the vagus nerve. The patient is instructed to bear down as if they were having a bowel movement. In effect, the patient is expiring against a closed glottis. An alternative way to perform a Valsalva maneuver is to tell the patient to blow through an occluded straw for several seconds (Hold one end closed and blow through the other). These maneuvers increase intrathoracic pressure and stimulate the vagus nerve.  Coughing: Coughing creates the same physiological response as bearing down, but some people may find it easier to perform. The cough must be forceful and sustained i.e. a single cough will likely not be effective in terminating an arrhythmia  Cold Stimulus to the Face: This technique involves emerging the face in ice cold water. Alternative methods include placing on icepack on the face or a washcloth soaked in ice water. The cold stimuli to the face should last about 10 seconds. This type of vagal maneuver creates a physiological response similar to that which occurs if a person is submerged in cold water (diver's reflex).  Gagging: Although it may not sound pleasant, gagging also stimulates the vagus nerve and can stop an episode of SVT.  A tongue depressor is briefly inserted into the mouth, touching the back of the throat, which causes the person to reflexively gag. The gag reflex stimulates the vagus nerve.    

## 2015-10-29 ENCOUNTER — Telehealth: Payer: Self-pay | Admitting: *Deleted

## 2015-10-29 LAB — GC/CHLAMYDIA PROBE AMP
Chlamydia trachomatis, NAA: NEGATIVE
NEISSERIA GONORRHOEAE BY PCR: NEGATIVE

## 2015-10-30 ENCOUNTER — Encounter: Payer: Medicaid Other | Admitting: Obstetrics & Gynecology

## 2015-10-30 LAB — STREP GP B NAA: Strep Gp B NAA: POSITIVE — AB

## 2015-10-30 NOTE — Telephone Encounter (Signed)
Thank you noted.

## 2015-11-04 ENCOUNTER — Ambulatory Visit (INDEPENDENT_AMBULATORY_CARE_PROVIDER_SITE_OTHER): Payer: Medicaid Other | Admitting: Obstetrics & Gynecology

## 2015-11-04 ENCOUNTER — Encounter: Payer: Self-pay | Admitting: Obstetrics & Gynecology

## 2015-11-04 VITALS — BP 110/60 | HR 76 | Wt 137.0 lb

## 2015-11-04 DIAGNOSIS — Z331 Pregnant state, incidental: Secondary | ICD-10-CM

## 2015-11-04 DIAGNOSIS — O3663X1 Maternal care for excessive fetal growth, third trimester, fetus 1: Secondary | ICD-10-CM

## 2015-11-04 DIAGNOSIS — O99313 Alcohol use complicating pregnancy, third trimester: Secondary | ICD-10-CM | POA: Diagnosis not present

## 2015-11-04 DIAGNOSIS — O09893 Supervision of other high risk pregnancies, third trimester: Secondary | ICD-10-CM

## 2015-11-04 DIAGNOSIS — Z1389 Encounter for screening for other disorder: Secondary | ICD-10-CM | POA: Diagnosis not present

## 2015-11-04 LAB — POCT URINALYSIS DIPSTICK
Blood, UA: NEGATIVE
Glucose, UA: NEGATIVE
KETONES UA: NEGATIVE
LEUKOCYTES UA: NEGATIVE
NITRITE UA: NEGATIVE
PROTEIN UA: 1

## 2015-11-04 MED ORDER — CLONAZEPAM 1 MG PO TABS: 1.0000 mg | ORAL_TABLET | Freq: Two times a day (BID) | ORAL | Status: DC

## 2015-11-04 NOTE — Progress Notes (Signed)
Fetal Surveillance Testing today:  FHR 145   High Risk Pregnancy Diagnosis(es):   Substance abuse  [id number] [redacted]w[redacted]d Estimated Date of Delivery: 11/20/15  Blood pressure 110/60, pulse 76, weight 137 lb (62.143 kg), last menstrual period 02/07/2015, unknown if currently breastfeeding.  Urinalysis: Positive for 1+ protein   HPI: The patient is being seen today for ongoing management of substance abuse. Today she reports pt confronted with telephone call we received regarding her selling her klonipen, she denies any it emphatically or any knowledge of it   BP weight and urine results all reviewed and noted. Patient reports good fetal movement, denies any bleeding and no rupture of membranes symptoms or regular contractions.  Fundal Height:  35 Fetal Heart rate:  140 Edema:  none  Patient is without complaints other than noted in her HPI. All questions were answered.  All lab and sonogram results have been reviewed. Comments: abnormal mild bilateral fetal pyelectaisis  Assessment:  1.  Pregnancy at [redacted]w[redacted]d,  Estimated Date of Delivery: 11/20/15 :                          2.  Substance abuse                        3.  Suspected fetal macrosomia  Medication(s) Plans:  Continue klonipen 1 mg BID  Treatment Plan:  Follow up next week No Follow-up on file. for appointment for high risk OB care  No orders of the defined types were placed in this encounter.   Orders Placed This Encounter  Procedures  . POCT urinalysis dipstick

## 2015-11-12 ENCOUNTER — Inpatient Hospital Stay (HOSPITAL_COMMUNITY)
Admission: AD | Admit: 2015-11-12 | Discharge: 2015-11-12 | Disposition: A | Discharge status: Home / Self Care | Payer: Medicaid Other | Source: Ambulatory Visit | Attending: Obstetrics and Gynecology | Admitting: Obstetrics and Gynecology

## 2015-11-12 ENCOUNTER — Encounter (HOSPITAL_COMMUNITY): Payer: Self-pay

## 2015-11-12 DIAGNOSIS — Z3A38 38 weeks gestation of pregnancy: Secondary | ICD-10-CM | POA: Diagnosis not present

## 2015-11-12 DIAGNOSIS — F418 Other specified anxiety disorders: Secondary | ICD-10-CM | POA: Diagnosis not present

## 2015-11-12 DIAGNOSIS — O26893 Other specified pregnancy related conditions, third trimester: Secondary | ICD-10-CM | POA: Diagnosis not present

## 2015-11-12 DIAGNOSIS — O99891 Other specified diseases and conditions complicating pregnancy: Secondary | ICD-10-CM

## 2015-11-12 DIAGNOSIS — Z87891 Personal history of nicotine dependence: Secondary | ICD-10-CM | POA: Diagnosis not present

## 2015-11-12 DIAGNOSIS — R102 Pelvic and perineal pain: Secondary | ICD-10-CM | POA: Insufficient documentation

## 2015-11-12 DIAGNOSIS — O99343 Other mental disorders complicating pregnancy, third trimester: Secondary | ICD-10-CM | POA: Insufficient documentation

## 2015-11-12 DIAGNOSIS — M791 Myalgia: Secondary | ICD-10-CM | POA: Diagnosis not present

## 2015-11-12 DIAGNOSIS — Z87442 Personal history of urinary calculi: Secondary | ICD-10-CM | POA: Insufficient documentation

## 2015-11-12 DIAGNOSIS — O9989 Other specified diseases and conditions complicating pregnancy, childbirth and the puerperium: Secondary | ICD-10-CM

## 2015-11-12 DIAGNOSIS — M7918 Myalgia, other site: Secondary | ICD-10-CM

## 2015-11-12 NOTE — MAU Provider Note (Signed)
History     CSN: [id number]  Arrival date and time: 11/12/15 2229   First Provider Initiated Contact with Patient 11/12/15 2243      No chief complaint on file.  HPI [name] is a 25yo [id number] @ 38.6wks by 6wk scan who presents for eval of pain over pubic symphysis and in the low right abd. Denies leaking or bldg. Having irreg ctx. She reports running with her child today and falling on her buttocks. No fever, N/V/D. Her preg has been followed by the [facility] service and has been remarkable for 1) depression w/ anxiety/hx SI- taking Klonopin and Lexapro 2) abnl Pap 3) Rh neg 4) GBS pos 5) suspected macrosomia. Per nursing, pt very tearful upon arrival. States to me that she just wants to be induced.   OB History    Gravida Para Term Preterm AB TAB SAB Ectopic Multiple Living   4 2 2  0 1 1 0 0 0 2      Past Medical History  Diagnosis Date  . Anemia   . Urinary tract infection     gets them frequently  . Pregnant   . Bacterial infection   . Panic attacks   . 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 Infection     UTI  . Kidney stones   . Vaginal Pap smear, abnormal   . Depression     on meds now, doing well    Past Surgical History  Procedure Laterality Date  . Dilation and evacuation  11/28/2010    Family History  Problem Relation Age of Onset  . Hypertension Father   . Cancer Father     skin  . Mental illness Father   . Cancer Maternal Grandmother   . Mental illness Maternal Grandmother   . Cerebral palsy Mother   . Other Brother     problems with gums  . Heart attack Paternal Grandmother     Social History  Substance Use Topics  . Smoking status: Former Smoker    Types: Cigarettes    Quit date: 02/14/2014  . Smokeless tobacco: Never Used  . Alcohol Use: 0.0 oz/week    0 Standard drinks or equivalent per week     Comment: not since  early in preg    Allergies:  Allergies  Allergen Reactions  . Metronidazole Shortness Of Breath  . Tramadol Anaphylaxis    "causes throat to swell", states pt.  Patient states has taken Percocet before with no reaction or problem.    Facility-administered medications prior to admission  Medication Dose Route Frequency Provider Last Rate Last Dose  . Tdap (BOOSTRIX) injection 0.5 mL  0.5 mL Intramuscular Once Lavonia Drafts, MD       Prescriptions prior to admission  Medication Sig Dispense Refill Last Dose  . clonazePAM (KLONOPIN) 1 MG tablet Take 1 tablet (1 mg total) by mouth 2 (two) times daily. 28 tablet 0   . escitalopram (LEXAPRO) 10 MG tablet Take 1 tablet (10 mg total) by mouth daily. 30 tablet 11 Taking  . iron polysaccharides (NIFEREX) 150 MG capsule Take 150 mg by mouth daily.   Taking  . omeprazole (PRILOSEC) 20 MG capsule Take 1 capsule (20 mg total) by mouth daily. (Patient not taking: Reported on 11/04/2015) 30 capsule 1 Not Taking  . Prenatal Vit-Fe Fumarate-FA (PRENATAL MULTIVITAMIN) TABS tablet Take  1 tablet by mouth daily at 12 noon.   Taking    ROS None other than the pertinents listed in the HPI Physical Exam   Blood pressure 116/69, pulse 106, temperature 97.7 F (36.5 C), last menstrual period 02/07/2015, unknown if currently breastfeeding.  Physical Exam  Constitutional: She is oriented to person, place, and time. She appears well-developed.  HENT:  Head: Normocephalic.  Neck: Normal range of motion.  Cardiovascular: Normal rate.   Respiratory: Effort normal.  GI:  EFM 140s, +accels, no decels Irreg ctx, palp mild Tender to palp over PS and right low abd  Genitourinary: Vagina normal.  Cx 2/60/-2, sm bloody show on glove  Musculoskeletal:  MAE x 4, ambulates and shifts position slowly  Neurological: She is alert and oriented to person, place, and time.  Skin: Skin is warm and dry.  Psychiatric: She has a normal mood and affect. Her  behavior is normal. Thought content normal.    MAU Course  Procedures  MDM NST read Cx exam  Assessment and Plan  IUP@38 .6wks Possible separation of PS vs discomforts of late preg  Rev'd comfort measures including maternity support belt and referral to PT Pt stating she really wants to be induced Rec f/u at scheduled FT appt on 6/29 for plan of care  Serita Grammes CNM 11/12/2015, 10:56 PM

## 2015-11-12 NOTE — Discharge Instructions (Signed)
Braxton Hicks Contractions °Contractions of the uterus can occur throughout pregnancy. Contractions are not always a sign that you are in labor.  °WHAT ARE BRAXTON HICKS CONTRACTIONS?  °Contractions that occur before labor are called Braxton Hicks contractions, or false labor. Toward the end of pregnancy (32-34 weeks), these contractions can develop more often and may become more forceful. This is not true labor because these contractions do not result in opening (dilatation) and thinning of the cervix. They are sometimes difficult to tell apart from true labor because these contractions can be forceful and people have different pain tolerances. You should not feel embarrassed if you go to the hospital with false labor. Sometimes, the only way to tell if you are in true labor is for your health care provider to look for changes in the cervix. °If there are no prenatal problems or other health problems associated with the pregnancy, it is completely safe to be sent home with false labor and await the onset of true labor. °HOW CAN YOU TELL THE DIFFERENCE BETWEEN TRUE AND FALSE LABOR? °False Labor °· The contractions of false labor are usually shorter and not as hard as those of true labor.   °· The contractions are usually irregular.   °· The contractions are often felt in the front of the lower abdomen and in the groin.   °· The contractions may go away when you walk around or change positions while lying down.   °· The contractions get weaker and are shorter lasting as time goes on.   °· The contractions do not usually become progressively stronger, regular, and closer together as with true labor.   °True Labor °· Contractions in true labor last 30-70 seconds, become very regular, usually become more intense, and increase in frequency.   °· The contractions do not go away with walking.   °· The discomfort is usually felt in the top of the uterus and spreads to the lower abdomen and low back.   °· True labor can be  determined by your health care provider with an exam. This will show that the cervix is dilating and getting thinner.   °WHAT TO REMEMBER °· Keep up with your usual exercises and follow other instructions given by your health care provider.   °· Take medicines as directed by your health care provider.   °· Keep your regular prenatal appointments.   °· Eat and drink lightly if you think you are going into labor.   °· If Braxton Hicks contractions are making you uncomfortable:   °¨ Change your position from lying down or resting to walking, or from walking to resting.   °¨ Sit and rest in a tub of warm water.   °¨ Drink 2-3 glasses of water. Dehydration may cause these contractions.   °¨ Do slow and deep breathing several times an hour.   °WHEN SHOULD I SEEK IMMEDIATE MEDICAL CARE? °Seek immediate medical care if: °· Your contractions become stronger, more regular, and closer together.   °· You have fluid leaking or gushing from your vagina.   °· You have a fever.   °· You pass blood-tinged mucus.   °· You have vaginal bleeding.   °· You have continuous abdominal pain.   °· You have low back pain that you never had before.   °· You feel your baby's head pushing down and causing pelvic pressure.   °· Your baby is not moving as much as it used to.   °  °This information is not intended to replace advice given to you by your health care provider. Make sure you discuss any questions you have with your health care   provider. °  °Document Released: 05/03/2005 Document Revised: 05/08/2013 Document Reviewed: 02/12/2013 °Elsevier Interactive Patient Education ©2016 Elsevier Inc. ° °

## 2015-11-13 ENCOUNTER — Ambulatory Visit (INDEPENDENT_AMBULATORY_CARE_PROVIDER_SITE_OTHER): Payer: Medicaid Other | Admitting: Obstetrics & Gynecology

## 2015-11-13 ENCOUNTER — Encounter: Payer: Self-pay | Admitting: Obstetrics & Gynecology

## 2015-11-13 VITALS — BP 110/60 | HR 100 | Wt 137.4 lb

## 2015-11-13 DIAGNOSIS — Z331 Pregnant state, incidental: Secondary | ICD-10-CM | POA: Diagnosis not present

## 2015-11-13 DIAGNOSIS — O3663X1 Maternal care for excessive fetal growth, third trimester, fetus 1: Secondary | ICD-10-CM | POA: Diagnosis not present

## 2015-11-13 DIAGNOSIS — Z1389 Encounter for screening for other disorder: Secondary | ICD-10-CM | POA: Diagnosis not present

## 2015-11-13 DIAGNOSIS — Z3A40 40 weeks gestation of pregnancy: Secondary | ICD-10-CM | POA: Diagnosis not present

## 2015-11-13 DIAGNOSIS — O99313 Alcohol use complicating pregnancy, third trimester: Secondary | ICD-10-CM

## 2015-11-13 DIAGNOSIS — O99323 Drug use complicating pregnancy, third trimester: Secondary | ICD-10-CM

## 2015-11-13 DIAGNOSIS — O09893 Supervision of other high risk pregnancies, third trimester: Secondary | ICD-10-CM

## 2015-11-13 LAB — POCT URINALYSIS DIPSTICK
Blood, UA: NEGATIVE
GLUCOSE UA: NEGATIVE
KETONES UA: NEGATIVE
LEUKOCYTES UA: NEGATIVE
NITRITE UA: NEGATIVE

## 2015-11-19 ENCOUNTER — Encounter: Payer: Medicaid Other | Admitting: Women's Health

## 2015-11-20 ENCOUNTER — Encounter: Payer: Self-pay | Admitting: Women's Health

## 2015-11-20 ENCOUNTER — Ambulatory Visit (INDEPENDENT_AMBULATORY_CARE_PROVIDER_SITE_OTHER): Payer: Medicaid Other | Admitting: Women's Health

## 2015-11-20 VITALS — BP 112/70 | HR 73 | Wt 136.0 lb

## 2015-11-20 DIAGNOSIS — O360130 Maternal care for anti-D [Rh] antibodies, third trimester, not applicable or unspecified: Secondary | ICD-10-CM

## 2015-11-20 DIAGNOSIS — IMO0002 Reserved for concepts with insufficient information to code with codable children: Secondary | ICD-10-CM

## 2015-11-20 DIAGNOSIS — Z369 Encounter for antenatal screening, unspecified: Secondary | ICD-10-CM

## 2015-11-20 DIAGNOSIS — Z3A40 40 weeks gestation of pregnancy: Secondary | ICD-10-CM | POA: Diagnosis not present

## 2015-11-20 DIAGNOSIS — O358XX0 Maternal care for other (suspected) fetal abnormality and damage, not applicable or unspecified: Secondary | ICD-10-CM

## 2015-11-20 DIAGNOSIS — Z331 Pregnant state, incidental: Secondary | ICD-10-CM | POA: Diagnosis not present

## 2015-11-20 DIAGNOSIS — O35EXX0 Maternal care for other (suspected) fetal abnormality and damage, fetal genitourinary anomalies, not applicable or unspecified: Secondary | ICD-10-CM

## 2015-11-20 DIAGNOSIS — O3663X0 Maternal care for excessive fetal growth, third trimester, not applicable or unspecified: Secondary | ICD-10-CM | POA: Diagnosis not present

## 2015-11-20 DIAGNOSIS — O98811 Other maternal infectious and parasitic diseases complicating pregnancy, first trimester: Secondary | ICD-10-CM

## 2015-11-20 DIAGNOSIS — A749 Chlamydial infection, unspecified: Secondary | ICD-10-CM

## 2015-11-20 DIAGNOSIS — O09893 Supervision of other high risk pregnancies, third trimester: Secondary | ICD-10-CM | POA: Diagnosis not present

## 2015-11-20 DIAGNOSIS — Z1389 Encounter for screening for other disorder: Secondary | ICD-10-CM

## 2015-11-20 DIAGNOSIS — O99323 Drug use complicating pregnancy, third trimester: Secondary | ICD-10-CM

## 2015-11-20 LAB — POCT URINALYSIS DIPSTICK
GLUCOSE UA: NEGATIVE
Ketones, UA: NEGATIVE
Leukocytes, UA: NEGATIVE
Nitrite, UA: NEGATIVE
Protein, UA: NEGATIVE
RBC UA: NEGATIVE

## 2015-11-20 NOTE — Progress Notes (Signed)
High Risk Pregnancy Diagnosis(es): Substance abuse, suspected fetal macrosomia [id number] [redacted]w[redacted]d Estimated Date of Delivery: 11/20/15 BP 112/70 mmHg  Pulse 73  Wt 136 lb (61.689 kg)  LMP 02/07/2015 (Exact Date)  Urinalysis: Negative HPI:  Thought she was in labor yesterday, went to [facility] and they kept her overnight and told her she was 3-4cm and sent her home. Does not want to deliver at [facility] b/c of baby being 'born on drugs', but it was closer to her. UCs are still irregular today. States all she is taking right now is Klonopin, however all of her screens have been neg for benzos. She was confronted at last visit about selling it, which she denied. Will send uds today.  BP, weight, and urine reviewed.  Reports good fm. Denies regular uc's, lof, vb, uti s/s.   Fundal Height:  39 Fetal Heart rate:  150 Edema:  None SVE per request: 4/70/-2, declines membrane sweeping  Reviewed labor s/s, fkc.  EFW @ 36wks 3867g/>90% w/ normal 2hr gtt, [facility] doesn't have any openings today, so will get her in tomorrow for efw/afi. Largest baby 0000000 w/o complications.  All questions were answered Assessment: [redacted]w[redacted]d substance abuse, suspected fetal macrosomia Medication(s) Plans:  klonipin 1mg  BID as previously rx'd Treatment Plan:  Called to schedule IOL for 41wks for postdates, pt states she has a court date on 7/13 and she will go to jail if she misses it. D/t suspected fetal macrosomia, favorable multip, will schedule for 7/9 @ 0800 Follow up in 1d for efw/afi u/s (no visit), then 4wks for pp visit, abstinence until birth control

## 2015-11-20 NOTE — Progress Notes (Signed)
Pt states that she is having contractions but not regular.

## 2015-11-20 NOTE — Patient Instructions (Signed)
Your induction is scheduled for 7/9 @ 8:00am. Go to [facility] hospital, Maternity Admissions Unit (Emergency) entrance and let them know you are there to be induced. They will send someone from Labor & Delivery to come get you.    Call the office [phone]) or go to [facility] if:  You begin to have strong, frequent contractions  Your water breaks.  Sometimes it is a big gush of fluid, sometimes it is just a trickle that keeps getting your panties wet or running down your legs  You have vaginal bleeding.  It is normal to have a small amount of spotting if your cervix was checked.   You don't feel your baby moving like normal.  If you don't, get you something to eat and drink and lay down and focus on feeling your baby move.  You should feel at least 10 movements in 2 hours.  If you don't, you should call the office or go to [place] Contractions Contractions of the uterus can occur throughout pregnancy. Contractions are not always a sign that you are in labor.  WHAT ARE BRAXTON HICKS CONTRACTIONS?  Contractions that occur before labor are called Braxton Hicks contractions, or false labor. Toward the end of pregnancy (32-34 weeks), these contractions can develop more often and may become more forceful. This is not true labor because these contractions do not result in opening (dilatation) and thinning of the cervix. They are sometimes difficult to tell apart from true labor because these contractions can be forceful and people have different pain tolerances. You should not feel embarrassed if you go to the hospital with false labor. Sometimes, the only way to tell if you are in true labor is for your health care provider to look for changes in the cervix. If there are no prenatal problems or other health problems associated with the pregnancy, it is completely safe to be sent home with false labor and await the onset of true labor. HOW CAN YOU TELL THE DIFFERENCE BETWEEN  TRUE AND FALSE LABOR? False Labor  The contractions of false labor are usually shorter and not as hard as those of true labor.   The contractions are usually irregular.   The contractions are often felt in the front of the lower abdomen and in the groin.   The contractions may go away when you walk around or change positions while lying down.   The contractions get weaker and are shorter lasting as time goes on.   The contractions do not usually become progressively stronger, regular, and closer together as with true labor.  True Labor  Contractions in true labor last 30-70 seconds, become very regular, usually become more intense, and increase in frequency.   The contractions do not go away with walking.   The discomfort is usually felt in the top of the uterus and spreads to the lower abdomen and low back.   True labor can be determined by your health care provider with an exam. This will show that the cervix is dilating and getting thinner.  WHAT TO REMEMBER  Keep up with your usual exercises and follow other instructions given by your health care provider.   Take medicines as directed by your health care provider.   Keep your regular prenatal appointments.   Eat and drink lightly if you think you are going into labor.   If Braxton Hicks contractions are making you uncomfortable:   Change your position from lying down or  resting to walking, or from walking to resting.   Sit and rest in a tub of warm water.   Drink 2-3 glasses of water. Dehydration may cause these contractions.   Do slow and deep breathing several times an hour.  WHEN SHOULD I SEEK IMMEDIATE MEDICAL CARE? Seek immediate medical care if:  Your contractions become stronger, more regular, and closer together.   You have fluid leaking or gushing from your vagina.   You have a fever.   You pass blood-tinged mucus.   You have vaginal bleeding.   You have continuous abdominal  pain.   You have low back pain that you never had before.   You feel your baby's head pushing down and causing pelvic pressure.   Your baby is not moving as much as it used to.    This information is not intended to replace advice given to you by your health care provider. Make sure you discuss any questions you have with your health care provider.   Document Released: 05/03/2005 Document Revised: 05/08/2013 Document Reviewed: 02/12/2013 Elsevier Interactive Patient Education [employer].

## 2015-11-21 ENCOUNTER — Encounter (HOSPITAL_COMMUNITY): Payer: Self-pay | Admitting: *Deleted

## 2015-11-21 ENCOUNTER — Ambulatory Visit (INDEPENDENT_AMBULATORY_CARE_PROVIDER_SITE_OTHER): Payer: Medicaid Other

## 2015-11-21 ENCOUNTER — Telehealth (HOSPITAL_COMMUNITY): Payer: Self-pay | Admitting: *Deleted

## 2015-11-21 DIAGNOSIS — Z3A41 41 weeks gestation of pregnancy: Secondary | ICD-10-CM

## 2015-11-21 DIAGNOSIS — O3663X0 Maternal care for excessive fetal growth, third trimester, not applicable or unspecified: Secondary | ICD-10-CM

## 2015-11-21 DIAGNOSIS — O358XX0 Maternal care for other (suspected) fetal abnormality and damage, not applicable or unspecified: Secondary | ICD-10-CM

## 2015-11-21 DIAGNOSIS — O480 Post-term pregnancy: Secondary | ICD-10-CM | POA: Diagnosis not present

## 2015-11-21 DIAGNOSIS — O09893 Supervision of other high risk pregnancies, third trimester: Secondary | ICD-10-CM

## 2015-11-21 DIAGNOSIS — O35EXX0 Maternal care for other (suspected) fetal abnormality and damage, fetal genitourinary anomalies, not applicable or unspecified: Secondary | ICD-10-CM

## 2015-11-21 LAB — PMP SCREEN PROFILE (10S), URINE
AMPHETAMINE SCRN UR: NEGATIVE ng/mL
Barbiturate Screen, Ur: NEGATIVE ng/mL
Benzodiazepine Screen, Urine: POSITIVE ng/mL
CANNABINOIDS UR QL SCN: NEGATIVE ng/mL
CREATININE(CRT), U: 95.5 mg/dL (ref 20.0–300.0)
Cocaine(Metab.)Screen, Urine: NEGATIVE ng/mL
METHADONE SCREEN, URINE: NEGATIVE ng/mL
Opiate Scrn, Ur: NEGATIVE ng/mL
Oxycodone+Oxymorphone Ur Ql Scn: NEGATIVE ng/mL
PCP SCRN UR: NEGATIVE ng/mL
PH UR, DRUG SCRN: 7.5 (ref 4.5–8.9)
PROPOXYPHENE SCREEN: NEGATIVE ng/mL

## 2015-11-21 NOTE — Progress Notes (Signed)
[place] [id number] wks,cephalic,ant pl gr 3,normal ov's bilat,afi 10.6 cm,fhr 144 bpm,LK pyelectasis 9.1 mm,efw 4824 >90%

## 2015-11-21 NOTE — Telephone Encounter (Signed)
Preadmission screen  

## 2015-11-23 ENCOUNTER — Inpatient Hospital Stay (HOSPITAL_COMMUNITY): Admission: RE | Admit: 2015-11-23 | Payer: Medicaid Other | Source: Ambulatory Visit

## 2015-11-27 ENCOUNTER — Telehealth: Payer: Self-pay | Admitting: Obstetrics & Gynecology

## 2015-11-27 NOTE — Telephone Encounter (Signed)
Pt called stating that she would like to speak with a nurse or a doctor regarding her stitches. Please contact pt

## 2015-11-27 NOTE — Telephone Encounter (Signed)
Pt c/o itching where her vaginal stitches are at. Pt states she delivered at [facility]. No c/o drainage, heat, fever, or redness/swelling at incision site. Pt informed itching could be from normal healing or from wearing a pad. Pt informed in no improvement in a 2-3 days call our office back. Pt verbalized understanding.

## 2015-12-05 ENCOUNTER — Telehealth: Payer: Self-pay | Admitting: Obstetrics & Gynecology

## 2015-12-08 ENCOUNTER — Telehealth: Payer: Self-pay | Admitting: *Deleted

## 2015-12-08 NOTE — Telephone Encounter (Signed)
Pt requesting refill on Klonopin and Lexapro. Please advise.

## 2015-12-10 NOTE — Telephone Encounter (Signed)
Pt states [facility] was who she got the Klonopin and Lexapro RX prior to pregnancy.   Pt informed she would need to get these RX from them since she is no longer pregnant per Dr. Elonda Husky.  Pt states would Dr.Eure be willing to prescribe medications until her appt with [facility] August 8,201?  Please advise.

## 2015-12-15 ENCOUNTER — Other Ambulatory Visit: Payer: Self-pay | Admitting: Obstetrics & Gynecology

## 2015-12-15 MED ORDER — CLONAZEPAM 1 MG PO TABS: 1.0000 mg | ORAL_TABLET | Freq: Two times a day (BID) | ORAL | 0 refills | Status: DC

## 2015-12-15 MED ORDER — ESCITALOPRAM OXALATE 10 MG PO TABS
10.0000 mg | ORAL_TABLET | Freq: Every day | ORAL | 0 refills | Status: DC
Start: 2015-12-15 — End: 2016-01-12

## 2015-12-16 NOTE — Telephone Encounter (Signed)
Done

## 2015-12-18 ENCOUNTER — Encounter: Payer: Self-pay | Admitting: Advanced Practice Midwife

## 2015-12-18 ENCOUNTER — Ambulatory Visit (INDEPENDENT_AMBULATORY_CARE_PROVIDER_SITE_OTHER): Payer: Medicaid Other | Admitting: Advanced Practice Midwife

## 2015-12-18 VITALS — BP 108/76 | HR 104 | Ht 63.0 in | Wt 113.0 lb

## 2015-12-18 DIAGNOSIS — Z3202 Encounter for pregnancy test, result negative: Secondary | ICD-10-CM

## 2015-12-18 LAB — POCT URINE PREGNANCY: Preg Test, Ur: NEGATIVE

## 2015-12-18 NOTE — Progress Notes (Signed)
Patricia Richards is a 25 y.o. who presents for a postpartum visit. She is 4 weeks postpartum following a spontaneous vaginal delivery. I have fully reviewed the prenatal and intrapartum course. The delivery was at 40.1 gestational weeks. She delivered in the parking lot of the hospital. Anesthesia: none. Postpartum course has been uneventful. Baby's course has been uneventful. Baby is feeding by bottle. Bleeding: off an on light. Bowel function is normal. Bladder function is normal. Patient is not sexually active. Contraception method is none. Postpartum depression screening: equivocal.  Feeling better now than she has over the past several weeks.  Had been out of Klonopin and felt anxious.  Has appt w/therapist 8/10.  Denies SI/HI. Wants Mirena IUD.  FOB in jail.  .   Current Outpatient Prescriptions:  .  clonazePAM (KLONOPIN) 1 MG tablet, Take 1 tablet (1 mg total) by mouth 2 (two) times daily., Disp: 28 tablet, Rfl: 0 .  escitalopram (LEXAPRO) 10 MG tablet, Take 1 tablet (10 mg total) by mouth daily., Disp: 30 tablet, Rfl: 0  Review of Systems   Constitutional: Negative for fever and chills Eyes: Negative for visual disturbances Respiratory: Negative for shortness of breath, dyspnea Cardiovascular: Negative for chest pain or palpitations  Gastrointestinal: Negative for vomiting, diarrhea and constipation Genitourinary: Negative for dysuria and urgency Musculoskeletal: Negative for back pain, joint pain, myalgias  Neurological: Negative for dizziness and headaches   Objective:     Vitals:   12/18/15 1103  BP: 108/76  Pulse: (!) 104   General:  alert, cooperative and no distress   Breasts:  negative  Lungs: clear to auscultation bilaterally  Heart:  regular rate and rhythm  Abdomen: Soft, nontender   Vulva:  normal  Vagina: normal vagina  Cervix:  closed  Corpus: Well involuted     Rectal Exam: no hemorrhoids        Assessment:    normal postpartum exam.  Plan:    1.  Contraception: IUD 2. Follow up in: days for IUD, then colpo.

## 2015-12-30 ENCOUNTER — Ambulatory Visit: Payer: Medicaid Other | Admitting: Advanced Practice Midwife

## 2015-12-30 ENCOUNTER — Encounter: Payer: Self-pay | Admitting: Advanced Practice Midwife

## 2016-01-05 ENCOUNTER — Encounter: Payer: Medicaid Other | Admitting: Obstetrics & Gynecology

## 2016-01-12 ENCOUNTER — Encounter: Payer: Self-pay | Admitting: Obstetrics & Gynecology

## 2016-01-12 ENCOUNTER — Ambulatory Visit (INDEPENDENT_AMBULATORY_CARE_PROVIDER_SITE_OTHER): Payer: Medicaid Other | Admitting: Obstetrics & Gynecology

## 2016-01-12 ENCOUNTER — Other Ambulatory Visit: Payer: Self-pay | Admitting: Obstetrics & Gynecology

## 2016-01-12 DIAGNOSIS — R896 Abnormal cytological findings in specimens from other organs, systems and tissues: Secondary | ICD-10-CM | POA: Diagnosis not present

## 2016-01-12 DIAGNOSIS — IMO0002 Reserved for concepts with insufficient information to code with codable children: Secondary | ICD-10-CM

## 2016-01-12 NOTE — Progress Notes (Signed)
Colposcopy Procedure Note:  Colposcopy Procedure Note  Indications: Pap smear 8 months ago showed: ASCUS with POSITIVE high risk HPV. The prior pap showed no abnormalities.  Prior cervical/vaginal disease: normal exam without visible pathology. Prior cervical treatment: no treatment.  Smoker:  Yes.   New sexual partner:  No.  : time frame:    History of abnormal Pap: yes  Procedure Details  The risks and benefits of the procedure and Written informed consent obtained.  Speculum placed in vagina and excellent visualization of cervix achieved, cervix swabbed x 3 with acetic acid solution.  Findings: Cervix: acetowhite lesion(s) noted at 9 o'clock and punctation noted at 9  o'clock; SCJ visualized - lesion at 9  o'clock, cervical biopsies taken at 9 o'clock, specimen labelled and sent to pathology and hemostasis achieved with Monsel's solution. Vaginal inspection: normal without visible lesions. Vulvar colposcopy: vulvar colposcopy not performed.  Specimens: cervical biopsy  Complications: none.  Plan: Specimens labelled and sent to Pathology. Will base further treatment on Pathology findings. Post biopsy instructions given to patient. Return to discuss Pathology results in 1 week.

## 2016-01-12 NOTE — Addendum Note (Signed)
Addended by: Diona Fanti A on: 01/12/2016 04:32 PM   Modules accepted: Orders

## 2016-01-15 ENCOUNTER — Emergency Department (HOSPITAL_COMMUNITY): Payer: Medicaid Other

## 2016-01-15 ENCOUNTER — Emergency Department (HOSPITAL_COMMUNITY)
Admission: EM | Admit: 2016-01-15 | Discharge: 2016-01-16 | Disposition: A | Discharge status: Home / Self Care | Payer: Medicaid Other | Source: Home / Self Care | Attending: Emergency Medicine | Admitting: Emergency Medicine

## 2016-01-15 ENCOUNTER — Encounter (HOSPITAL_COMMUNITY): Payer: Self-pay | Admitting: Emergency Medicine

## 2016-01-15 ENCOUNTER — Emergency Department (HOSPITAL_COMMUNITY)
Admission: EM | Admit: 2016-01-15 | Discharge: 2016-01-15 | Disposition: A | Discharge status: Home / Self Care | Payer: Medicaid Other | Attending: Emergency Medicine | Admitting: Emergency Medicine

## 2016-01-15 DIAGNOSIS — K529 Noninfective gastroenteritis and colitis, unspecified: Secondary | ICD-10-CM

## 2016-01-15 DIAGNOSIS — R109 Unspecified abdominal pain: Secondary | ICD-10-CM | POA: Diagnosis present

## 2016-01-15 DIAGNOSIS — R112 Nausea with vomiting, unspecified: Secondary | ICD-10-CM

## 2016-01-15 DIAGNOSIS — F17210 Nicotine dependence, cigarettes, uncomplicated: Secondary | ICD-10-CM | POA: Insufficient documentation

## 2016-01-15 DIAGNOSIS — Z79899 Other long term (current) drug therapy: Secondary | ICD-10-CM | POA: Diagnosis not present

## 2016-01-15 LAB — COMPREHENSIVE METABOLIC PANEL
ALBUMIN: 4.6 g/dL (ref 3.5–5.0)
ALK PHOS: 57 U/L (ref 38–126)
ALT: 17 U/L (ref 14–54)
AST: 19 U/L (ref 15–41)
Anion gap: 8 (ref 5–15)
BILIRUBIN TOTAL: 0.6 mg/dL (ref 0.3–1.2)
BUN: 16 mg/dL (ref 6–20)
CALCIUM: 9.5 mg/dL (ref 8.9–10.3)
CO2: 26 mmol/L (ref 22–32)
CREATININE: 0.76 mg/dL (ref 0.44–1.00)
Chloride: 104 mmol/L (ref 101–111)
GFR calc Af Amer: >60 mL/min (ref >60)
GFR calc non Af Amer: >60 mL/min (ref >60)
GLUCOSE: 81 mg/dL (ref 65–99)
Potassium: 3.9 mmol/L (ref 3.5–5.1)
SODIUM: 138 mmol/L (ref 135–145)
TOTAL PROTEIN: 8 g/dL (ref 6.5–8.1)

## 2016-01-15 LAB — URINALYSIS, ROUTINE W REFLEX MICROSCOPIC
BILIRUBIN URINE: NEGATIVE
Glucose, UA: NEGATIVE mg/dL
KETONES UR: NEGATIVE mg/dL
Leukocytes, UA: NEGATIVE
Nitrite: NEGATIVE
Protein, ur: NEGATIVE mg/dL
SPECIFIC GRAVITY, URINE: 1.025 (ref 1.005–1.030)
pH: 7 (ref 5.0–8.0)

## 2016-01-15 LAB — URINE MICROSCOPIC-ADD ON: WBC, UA: NONE SEEN WBC/hpf (ref 0–5)

## 2016-01-15 LAB — CBC
HCT: 39.7 % (ref 36.0–46.0)
HEMOGLOBIN: 12.8 g/dL (ref 12.0–15.0)
MCH: 28.5 pg (ref 26.0–34.0)
MCHC: 32.2 g/dL (ref 30.0–36.0)
MCV: 88.4 fL (ref 78.0–100.0)
Platelets: 282 10*3/uL (ref 150–400)
RBC: 4.49 MIL/uL (ref 3.87–5.11)
RDW: 14 % (ref 11.5–15.5)
WBC: 6.5 10*3/uL (ref 4.0–10.5)

## 2016-01-15 LAB — LIPASE, BLOOD: Lipase: 38 U/L (ref 11–51)

## 2016-01-15 LAB — PREGNANCY, URINE: PREG TEST UR: NEGATIVE

## 2016-01-15 MED ORDER — ONDANSETRON HCL 4 MG PO TABS: 4.0000 mg | ORAL_TABLET | Freq: Three times a day (TID) | ORAL | 0 refills | Status: DC | PRN

## 2016-01-15 MED ORDER — AMOXICILLIN-POT CLAVULANATE 875-125 MG PO TABS: 1.0000 | ORAL_TABLET | Freq: Two times a day (BID) | ORAL | 0 refills | Status: DC

## 2016-01-15 MED ORDER — ACETAMINOPHEN 325 MG PO TABS: 650.0000 mg | ORAL_TABLET | Freq: Once | ORAL | Status: DC

## 2016-01-15 MED ORDER — ONDANSETRON HCL 4 MG/2ML IJ SOLN
4.0000 mg | Freq: Once | INTRAMUSCULAR | Status: AC
  Administered 2016-01-15: 4 mg via INTRAVENOUS
  Filled 2016-01-15: qty 2

## 2016-01-15 MED ORDER — IBUPROFEN 400 MG PO TABS
400.0000 mg | ORAL_TABLET | Freq: Once | ORAL | Status: AC
  Administered 2016-01-15: 400 mg via ORAL
  Filled 2016-01-15: qty 1

## 2016-01-15 MED ORDER — ACETAMINOPHEN 325 MG PO TABS
650.0000 mg | ORAL_TABLET | Freq: Once | ORAL | Status: AC
  Administered 2016-01-15: 650 mg via ORAL
  Filled 2016-01-15: qty 2

## 2016-01-15 MED ORDER — SODIUM CHLORIDE 0.9 % IV BOLUS (SEPSIS)
1000.0000 mL | Freq: Once | INTRAVENOUS | Status: AC
  Administered 2016-01-15: 1000 mL via INTRAVENOUS

## 2016-01-15 NOTE — Discharge Instructions (Signed)
Take the prescriptions as directed.  Increase your fluid intake (ie:  Gatoraide) for the next few days.  Eat a bland diet and advance to your regular diet slowly as you can tolerate it.   Avoid full strength juices, as well as milk and milk products until your diarrhea has resolved.   Call your regular medical doctor today to schedule a follow up appointment this week. Call the GI doctor today to schedule a follow up appointment within the next week. Return to the Emergency Department immediately sooner if not improving (or even worsening) despite taking the medicines as prescribed, any black or bloody stool or vomit, if you develop a fever over "101," or for any other concerns.

## 2016-01-15 NOTE — ED Provider Notes (Signed)
[facility] DEPT Provider Note   CSN: [id number] Arrival date & time: 01/15/16  2147     History   Chief Complaint Chief Complaint  Patient presents with  . Emesis    HPI Patricia Richards is a 25 y.o. female.   Emesis   Pertinent negatives include no abdominal pain, no chills, no diarrhea and no fever.     Patricia Richards is a 25 y.o. female who presents to the Emergency Department complaining of vomiting.  She was seen here earlier today for left flank pain for last 2 days.  Pain is associated with movement and palpation.  She was diagnosed earlier with colitis and given Augmentin.  She took one dose this afternoon and developed nausea and vomiting beginning around 8 pm.  Last meal today was 10:00 am this morning.  She reports 4 episodes of yellow colored vomitus.  She denies diarrhea, worsening flank pain, fever, chills, and abdominal pain.      Past Medical History:  Diagnosis Date  . 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 Anemia   . Bacterial infection   . Depression    on meds now, doing well  . Infection    UTI  . Kidney stones   . 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 Panic attacks   . Pregnant   . Urinary tract infection    gets them frequently  . Vaginal Pap smear, abnormal     Patient Active Problem List   Diagnosis Date Noted  . Suspected Fetal macrosomia during pregnancy in third trimester, antepartum 11/20/2015  . Pyelectasis of fetus on prenatal ultrasound 07/01/2015  . 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 Chlamydia infection affecting pregnancy in first trimester, antepartum 05/20/2015  . Depression with anxiety 05/13/2015  . Hallucinations 05/13/2015  . Supervision of other high-risk pregnancy 05/13/2015  . History of suicidal ideation 05/13/2015  . Alcohol use affecting pregnancy, antepartum 03/18/2015  . BV (bacterial vaginosis) 11/13/2013  . Rh  negative, antepartum (A neg blood type) 08/28/2013    Past Surgical History:  Procedure Laterality Date  . DILATION AND EVACUATION  11/28/2010    OB History    Gravida Para Term Preterm AB Living   4 3 3  0 1 3   SAB TAB Ectopic Multiple Live Births   0 1 0 0 3       Home Medications    Prior to Admission medications   Medication Sig Start Date End Date Taking? Authorizing Provider  amoxicillin-clavulanate (AUGMENTIN) 875-125 MG tablet Take 1 tablet by mouth every 12 (twelve) hours. 01/15/16   Francine Graven, DO  clonazePAM (KLONOPIN) 1 MG tablet Take 1 tablet (1 mg total) by mouth 2 (two) times daily. 12/15/15   Florian Buff, MD  FLUoxetine (PROZAC) 10 MG tablet Take 10 mg by mouth daily.    Historical Provider, MD  ondansetron (ZOFRAN) 4 MG tablet Take 1 tablet (4 mg total) by mouth every 8 (eight) hours as needed for nausea or vomiting. 01/15/16   Francine Graven, DO    Family History Family History  Problem Relation Age of Onset  . Hypertension Father   . Cancer Father     skin  . Mental illness Father   . Cancer Maternal Grandmother   . Mental illness Maternal Grandmother   . Cerebral palsy Mother   . Other Brother     problems  with gums  . Heart attack Paternal Grandmother     Social History Social History  Substance Use Topics  . Smoking status: Current Some Day Smoker    Types: Cigarettes    Last attempt to quit: 02/14/2014  . Smokeless tobacco: Never Used  . Alcohol use Not on file     Comment: occasionally      Allergies   Metronidazole and Tramadol   Review of Systems Review of Systems  Constitutional: Positive for appetite change. Negative for chills and fever.  HENT: Negative for sore throat.   Respiratory: Negative for shortness of breath.   Cardiovascular: Negative for chest pain.  Gastrointestinal: Positive for nausea and vomiting. Negative for abdominal distention, abdominal pain, constipation and diarrhea.  Genitourinary: Positive for  flank pain. Negative for difficulty urinating and vaginal bleeding.  Musculoskeletal: Negative for back pain.  Skin: Negative for rash.  Neurological: Negative for dizziness and weakness.     Physical Exam Updated Vital Signs BP 116/75   Pulse 101   Temp 97.7 F (36.5 C)   Resp 22   Ht 5\' 6"  (1.676 m)   Wt 54.9 kg   LMP 01/14/2016   SpO2 100%   BMI 19.53 kg/m   Physical Exam  Constitutional: She appears well-developed and well-nourished. No distress.  HENT:  Head: Normocephalic.  Mouth/Throat: Oropharynx is clear and moist.  Neck: Normal range of motion.  Cardiovascular: Normal rate and regular rhythm.   Pulmonary/Chest: Effort normal. No respiratory distress.  Abdominal: Soft. Normal appearance. She exhibits no distension and no mass. There is no tenderness. There is no guarding and no CVA tenderness.  Musculoskeletal:       Back:  ttp of the left lumbar paraspinal muscles.  Nursing note and vitals reviewed.    ED Treatments / Results  Labs (all labs ordered are listed, but only abnormal results are displayed) Labs Reviewed - No data to display  EKG  EKG Interpretation None       Radiology Ct Renal Stone Study  Result Date: 01/15/2016 CLINICAL DATA:  Left flank pain radiating to the left back with nausea. EXAM: CT ABDOMEN AND PELVIS WITHOUT CONTRAST TECHNIQUE: Multidetector CT imaging of the abdomen and pelvis was performed following the standard protocol without IV contrast. COMPARISON:  None. FINDINGS: Lower chest:  No acute findings. Hepatobiliary: The liver and gallbladder are normal. No mass visualized on this un-enhanced exam. Pancreas: No mass or inflammatory process identified on this un-enhanced exam. Spleen: Within normal limits in size. Adrenals/Urinary Tract: The adrenal glands and kidneys are normal. The bladder is normal. No evidence of nephrolithiasis or hydronephrosis. No definite mass visualized on this un-enhanced exam. Stomach/Bowel: There is  no small bowel obstruction. There is inflammation surrounding the colon at the splenic flexure. No abnormal fluid collection is identified. The appendix is normal. Vascular/Lymphatic: No pathologically enlarged lymph nodes. No evidence of abdominal aortic aneurysm. Reproductive: No mass or other significant abnormality. Other: There is mild broad-based midline anterior herniation at the level of the umbilicus of colon and mesenteric fat. Musculoskeletal:  There is scoliosis of spine. IMPRESSION: Mild inflammation surrounding the colon at the splenic fracture consistent with colitis either infectious or inflammatory. No nephrolithiasis or hydroureteronephrosis bilaterally. Electronically Signed   By: Abelardo Diesel M.D.   On: 01/15/2016 13:53    Procedures Procedures (including critical care time)  Medications Ordered in ED Medications  sodium chloride 0.9 % bolus 1,000 mL (not administered)  ondansetron (ZOFRAN) injection 4 mg (not administered)  Initial Impression / Assessment and Plan / ED Course  I have reviewed the triage vital signs and the nursing notes.  Pertinent labs & imaging results that were available during my care of the patient were reviewed by me and considered in my medical decision making (see chart for details).  Clinical Course   Pt seen here earlier today and developed vomiting after taking Augmentin on empty stomach.  Abd remains soft, NT.  No concerning sx's for surgical abdomen.  Labs and imaging from earlier reviewed by me.    Pt feeling better after IVF's and zofran.  No vomiting during ED stay.  Nausea resolved.  Appears stable for d/c.  Agrees to try taking medication with food.  Has zofran Rx.  Agrees to PMD f/u.  Has referral info for GI.     Final Clinical Impressions(s) / ED Diagnoses   Final diagnoses:  Non-intractable vomiting with nausea, vomiting of unspecified type    New Prescriptions New Prescriptions   No medications on file     Bufford Lope 01/16/16 0019    Virgel Manifold, MD 01/21/16 1227

## 2016-01-15 NOTE — ED Provider Notes (Signed)
[facility] DEPT Provider Note   CSN: [id number] Arrival date & time: 01/15/16  1214     History   Chief Complaint Chief Complaint  Patient presents with  . Flank Pain    HPI Patricia Richards is a 25 y.o. female.  HPI  Pt was seen at 1305. Per pt, c/o gradual onset and persistence of constant left sided flank "pain" for the past 2 days.  Pt describes the pain as "sharp," worsens with palpation of the area and body position changes. Denies vaginal bleeding/discharge, no dysuria/hematuria, no abd pain, no N/V, no diarrhea, no black or blood in stools, no CP/SOB, no fevers, no rash.    Past Medical History:  Diagnosis Date  . 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 Anemia   . Bacterial infection   . Depression    on meds now, doing well  . Infection    UTI  . Kidney stones   . 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 Panic attacks   . Pregnant   . Urinary tract infection    gets them frequently  . Vaginal Pap smear, abnormal     Patient Active Problem List   Diagnosis Date Noted  . Suspected Fetal macrosomia during pregnancy in third trimester, antepartum 11/20/2015  . Pyelectasis of fetus on prenatal ultrasound 07/01/2015  . 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 Chlamydia infection affecting pregnancy in first trimester, antepartum 05/20/2015  . Depression with anxiety 05/13/2015  . Hallucinations 05/13/2015  . Supervision of other high-risk pregnancy 05/13/2015  . History of suicidal ideation 05/13/2015  . Alcohol use affecting pregnancy, antepartum 03/18/2015  . BV (bacterial vaginosis) 11/13/2013  . Rh negative, antepartum (A neg blood type) 08/28/2013    Past Surgical History:  Procedure Laterality Date  . DILATION AND EVACUATION  11/28/2010    OB History    Gravida Para Term Preterm AB Living   4 3 3  0 1 3   SAB TAB Ectopic Multiple Live Births   0 1 0 0 3        Home Medications    Prior to Admission medications   Medication Sig Start Date End Date Taking? Authorizing Provider  clonazePAM (KLONOPIN) 1 MG tablet Take 1 tablet (1 mg total) by mouth 2 (two) times daily. 12/15/15  Yes Florian Buff, MD  FLUoxetine (PROZAC) 10 MG tablet Take 10 mg by mouth daily.   Yes Historical Provider, MD    Family History Family History  Problem Relation Age of Onset  . Hypertension Father   . Cancer Father     skin  . Mental illness Father   . Cancer Maternal Grandmother   . Mental illness Maternal Grandmother   . Cerebral palsy Mother   . Other Brother     problems with gums  . Heart attack Paternal Grandmother     Social History Social History  Substance Use Topics  . Smoking status: Current Some Day Smoker    Types: Cigarettes    Last attempt to quit: 02/14/2014  . Smokeless tobacco: Never Used  . Alcohol use Not on file     Comment: occasionally      Allergies   Metronidazole and Tramadol   Review of Systems Review of Systems ROS: Statement: All systems negative except as marked or noted in the HPI; Constitutional: Negative for fever and chills. ; ; Eyes: Negative for  eye pain, redness and discharge. ; ; ENMT: Negative for ear pain, hoarseness, nasal congestion, sinus pressure and sore throat. ; ; Cardiovascular: Negative for chest pain, palpitations, diaphoresis, dyspnea and peripheral edema. ; ; Respiratory: Negative for cough, wheezing and stridor. ; ; Gastrointestinal: Negative for nausea, vomiting, diarrhea, abdominal pain, blood in stool, hematemesis, jaundice and rectal bleeding. . ; ; Genitourinary: +flank pain. Negative for dysuria and hematuria. ; ; GYN:  No pelvic pain, no vaginal bleeding, no vaginal discharge, no vulvar pain. ;; Musculoskeletal: Negative for back pain and neck pain. Negative for swelling and trauma.; ; Skin: Negative for pruritus, rash, abrasions, blisters, bruising and skin lesion.; ; Neuro: Negative for  headache, lightheadedness and neck stiffness. Negative for weakness, altered level of consciousness, altered mental status, extremity weakness, paresthesias, involuntary movement, seizure and syncope.      Physical Exam Updated Vital Signs BP 110/82 (BP Location: Left Arm)   Pulse 86   Temp 97.8 F (36.6 C) (Oral)   Resp 16   Ht 5\' 6"  (1.676 m)   Wt 121 lb (54.9 kg)   LMP 02/07/2015 (Exact Date)   SpO2 100%   BMI 19.53 kg/m   Physical Exam 1310: Physical examination:  Nursing notes reviewed; Vital signs and O2 SAT reviewed;  Constitutional: Well developed, Well nourished, Well hydrated, In no acute distress. Using electronic handheld device on my arrival to exam room.; Head:  Normocephalic, atraumatic; Eyes: EOMI, PERRL, No scleral icterus; ENMT: Mouth and pharynx normal, Mucous membranes moist; Neck: Supple, Full range of motion, No lymphadenopathy; Cardiovascular: Regular rate and rhythm, No murmur, rub, or gallop; Respiratory: Breath sounds clear & equal bilaterally, No rales, rhonchi, wheezes.  Speaking full sentences with ease, Normal respiratory effort/excursion; Chest: Nontender, Movement normal; Abdomen: Soft, Nontender, Nondistended, Normal bowel sounds; Genitourinary: No CVA tenderness; Spine:  No midline CS, TS, LS tenderness. +TTP left lumbar paraspinal muscles.;; Extremities: Pulses normal, No tenderness, No edema, No calf edema or asymmetry.; Neuro: AA&Ox3, Major CN grossly intact.  Speech clear. No gross focal motor or sensory deficits in extremities.; Skin: Color normal, Warm, Dry.   ED Treatments / Results  Labs (all labs ordered are listed, but only abnormal results are displayed)   EKG  EKG Interpretation None       Radiology   Procedures Procedures (including critical care time)  Medications Ordered in ED Medications  ibuprofen (ADVIL,MOTRIN) tablet 400 mg (not administered)  acetaminophen (TYLENOL) tablet 650 mg (not administered)     Initial  Impression / Assessment and Plan / ED Course  I have reviewed the triage vital signs and the nursing notes.  Pertinent labs & imaging results that were available during my care of the patient were reviewed by me and considered in my medical decision making (see chart for details).  MDM Reviewed: nursing note, vitals and previous chart Reviewed previous: labs Interpretation: labs and CT scan   Results for orders placed or performed during the hospital encounter of 01/15/16  Lipase, blood  Result Value Ref Range   Lipase 38 11 - 51 U/L  Comprehensive metabolic panel  Result Value Ref Range   Sodium 138 135 - 145 mmol/L   Potassium 3.9 3.5 - 5.1 mmol/L   Chloride 104 101 - 111 mmol/L   CO2 26 22 - 32 mmol/L   Glucose, Bld 81 65 - 99 mg/dL   BUN 16 6 - 20 mg/dL   Creatinine, Ser 0.76 0.44 - 1.00 mg/dL   Calcium 9.5 8.9 - 10.3  mg/dL   Total Protein 8.0 6.5 - 8.1 g/dL   Albumin 4.6 3.5 - 5.0 g/dL   AST 19 15 - 41 U/L   ALT 17 14 - 54 U/L   Alkaline Phosphatase 57 38 - 126 U/L   Total Bilirubin 0.6 0.3 - 1.2 mg/dL   GFR calc non Af Amer >60 >60 mL/min   GFR calc Af Amer >60 >60 mL/min   Anion gap 8 5 - 15  CBC  Result Value Ref Range   WBC 6.5 4.0 - 10.5 K/uL   RBC 4.49 3.87 - 5.11 MIL/uL   Hemoglobin 12.8 12.0 - 15.0 g/dL   HCT 39.7 36.0 - 46.0 %   MCV 88.4 78.0 - 100.0 fL   MCH 28.5 26.0 - 34.0 pg   MCHC 32.2 30.0 - 36.0 g/dL   RDW 14.0 11.5 - 15.5 %   Platelets 282 150 - 400 K/uL  Urinalysis, Routine w reflex microscopic  Result Value Ref Range   Color, Urine YELLOW YELLOW   APPearance HAZY (A) CLEAR   Specific Gravity, Urine 1.025 1.005 - 1.030   pH 7.0 5.0 - 8.0   Glucose, UA NEGATIVE NEGATIVE mg/dL   Hgb urine dipstick LARGE (A) NEGATIVE   Bilirubin Urine NEGATIVE NEGATIVE   Ketones, ur NEGATIVE NEGATIVE mg/dL   Protein, ur NEGATIVE NEGATIVE mg/dL   Nitrite NEGATIVE NEGATIVE   Leukocytes, UA NEGATIVE NEGATIVE  Pregnancy, urine  Result Value Ref Range   Preg  Test, Ur NEGATIVE NEGATIVE  Urine microscopic-add on  Result Value Ref Range   Squamous Epithelial / LPF 0-5 (A) NONE SEEN   WBC, UA NONE SEEN 0 - 5 WBC/hpf   RBC / HPF TOO NUMEROUS TO COUNT 0 - 5 RBC/hpf   Bacteria, UA RARE (A) NONE SEEN   Ct Renal Stone Study Result Date: 01/15/2016 CLINICAL DATA:  Left flank pain radiating to the left back with nausea. EXAM: CT ABDOMEN AND PELVIS WITHOUT CONTRAST TECHNIQUE: Multidetector CT imaging of the abdomen and pelvis was performed following the standard protocol without IV contrast. COMPARISON:  None. FINDINGS: Lower chest:  No acute findings. Hepatobiliary: The liver and gallbladder are normal. No mass visualized on this un-enhanced exam. Pancreas: No mass or inflammatory process identified on this un-enhanced exam. Spleen: Within normal limits in size. Adrenals/Urinary Tract: The adrenal glands and kidneys are normal. The bladder is normal. No evidence of nephrolithiasis or hydronephrosis. No definite mass visualized on this un-enhanced exam. Stomach/Bowel: There is no small bowel obstruction. There is inflammation surrounding the colon at the splenic flexure. No abnormal fluid collection is identified. The appendix is normal. Vascular/Lymphatic: No pathologically enlarged lymph nodes. No evidence of abdominal aortic aneurysm. Reproductive: No mass or other significant abnormality. Other: There is mild broad-based midline anterior herniation at the level of the umbilicus of colon and mesenteric fat. Musculoskeletal:  There is scoliosis of spine. IMPRESSION: Mild inflammation surrounding the colon at the splenic fracture consistent with colitis either infectious or inflammatory. No nephrolithiasis or hydroureteronephrosis bilaterally. Electronically Signed   By: Abelardo Diesel M.D.   On: 01/15/2016 13:53    1445:  Pt has tol PO well while in the ED without N/V.  No stooling while in the ED.  Abd remains benign, VSS. Feels better and wants to go home now. Pt now  reveals she has had a few days of diarrhea/soft stools. Denies black or bloody stools, no fevers. Will tx for possible colitis with augmentin (d/t allergy). Dx and testing  d/w pt.  Questions answered.  Verb understanding, agreeable to d/c home with outpt f/u.   Final Clinical Impressions(s) / ED Diagnoses   Final diagnoses:  Flank pain    New Prescriptions New Prescriptions   No medications on file     Francine Graven, DO 01/17/16 2019

## 2016-01-15 NOTE — ED Triage Notes (Signed)
Pt c/o emesis since being discharged from ed today.

## 2016-01-15 NOTE — ED Triage Notes (Signed)
Pt reports LT flank pain that radiates to LT lower back with nausea. States she has hx of kidney stones. States pain worsens with movement or lying on LT side.

## 2016-01-15 NOTE — ED Notes (Signed)
Pt to CT at this time.

## 2016-01-16 NOTE — Discharge Instructions (Signed)
Be sure to eat something at least 20 minutes before taking your antibiotic.  Follow-up with your primary doctor or GI if not improving.

## 2016-01-20 ENCOUNTER — Encounter: Payer: Self-pay | Admitting: Obstetrics & Gynecology

## 2016-01-20 ENCOUNTER — Ambulatory Visit (INDEPENDENT_AMBULATORY_CARE_PROVIDER_SITE_OTHER): Payer: Medicaid Other | Admitting: Obstetrics & Gynecology

## 2016-01-20 VITALS — BP 110/70 | HR 78 | Ht 66.0 in | Wt 118.0 lb

## 2016-01-20 DIAGNOSIS — IMO0002 Reserved for concepts with insufficient information to code with codable children: Secondary | ICD-10-CM

## 2016-01-20 DIAGNOSIS — R896 Abnormal cytological findings in specimens from other organs, systems and tissues: Secondary | ICD-10-CM | POA: Diagnosis not present

## 2016-01-20 NOTE — Progress Notes (Signed)
Follow up appointment for results  Chief Complaint  Patient presents with  . Follow-up    result biopsy    Blood pressure 110/70, pulse 78, height 5\' 6"  (1.676 m), weight 118 lb (53.5 kg), last menstrual period 01/14/2016, not currently breastfeeding.  Biopsy is negative, follow up Pap in 1 year   MEDS ordered this encounter: No orders of the defined types were placed in this encounter.   Orders for this encounter: No orders of the defined types were placed in this encounter.   Plan:  Follow Up: Pap 1 year     Face to face time:  10 minutes  Greater than 50% of the visit time was spent in counseling and coordination of care with the patient.  The summary and outline of the counseling and care coordination is summarized in the note above.   All questions were answered.  Past Medical History:  Diagnosis Date  . 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 Anemia   . Bacterial infection   . Depression    on meds now, doing well  . Infection    UTI  . Kidney stones   . 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 Panic attacks   . Pregnant   . Urinary tract infection    gets them frequently  . Vaginal Pap smear, abnormal     Past Surgical History:  Procedure Laterality Date  . DILATION AND EVACUATION  11/28/2010    OB History    Gravida Para Term Preterm AB Living   4 3 3  0 1 3   SAB TAB Ectopic Multiple Live Births   0 1 0 0 3      Allergies  Allergen Reactions  . Metronidazole Shortness Of Breath  . Tramadol Anaphylaxis    "causes throat to swell", states pt.  Patient states has taken Percocet before with no reaction or problem.    Social History   Social History  . Marital status: Single    Spouse name: N/A  . Number of children: N/A  . Years of education: N/A   Social History Main Topics  . Smoking status: Current Some Day Smoker    Types: Cigarettes    Last attempt to  quit: 02/14/2014  . Smokeless tobacco: Never Used  . Alcohol use None     Comment: occasionally   . Drug use: No     Comment: Klonopin last used marijuana or cocaine 3 years ago as of 11/2015  . Sexual activity: No   Other Topics Concern  . None   Social History Narrative  . None    Family History  Problem Relation Age of Onset  . Hypertension Father   . Cancer Father     skin  . Mental illness Father   . Cancer Maternal Grandmother   . Mental illness Maternal Grandmother   . Cerebral palsy Mother   . Other Brother     problems with gums  . Heart attack Paternal Grandmother

## 2016-01-28 NOTE — Progress Notes (Signed)
Fetal Surveillance Testing today:  FHR 145   High Risk Pregnancy Diagnosis(es):   Substance abuse, macrosomia suspected  [id number] [redacted]w[redacted]d Estimated Date of Delivery: 11/20/15  Blood pressure 110/60, pulse 100, weight 137 lb 6.4 oz (62.3 kg), last menstrual period 02/07/2015, not currently breastfeeding.  Urinalysis: Negative   HPI: The patient is being seen today for ongoing management of as above. Today she reports back pelvic pain   BP weight and urine results all reviewed and noted. Patient reports good fetal movement, denies any bleeding and no rupture of membranes symptoms or regular contractions.  Fundal Height:  40 Fetal Heart rate:  145 Edema:  none  Patient is without complaints other than noted in her HPI. All questions were answered.  All lab and sonogram results have been reviewed. Comments:    Assessment:  1.  Pregnancy at [redacted]w[redacted]d,  Estimated Date of Delivery: 11/20/15 :                          2.  Substance/narcotic abuse                        3.  Suspected macrosomia  Medication(s) Plans:    Treatment Plan:  Induction at 41 weeks if undelivered  No Follow-up on file. for appointment for high risk OB care  No orders of the defined types were placed in this encounter.  Orders Placed This Encounter  Procedures  . POCT urinalysis dipstick

## 2016-05-09 ENCOUNTER — Emergency Department (HOSPITAL_COMMUNITY)
Admission: EM | Admit: 2016-05-09 | Discharge: 2016-05-10 | Disposition: A | Discharge status: Home / Self Care | Payer: Medicaid Other | Attending: Emergency Medicine | Admitting: Emergency Medicine

## 2016-05-09 ENCOUNTER — Encounter (HOSPITAL_COMMUNITY): Payer: Self-pay | Admitting: *Deleted

## 2016-05-09 DIAGNOSIS — Z87891 Personal history of nicotine dependence: Secondary | ICD-10-CM | POA: Insufficient documentation

## 2016-05-09 DIAGNOSIS — Z79899 Other long term (current) drug therapy: Secondary | ICD-10-CM | POA: Diagnosis not present

## 2016-05-09 DIAGNOSIS — N760 Acute vaginitis: Secondary | ICD-10-CM | POA: Insufficient documentation

## 2016-05-09 DIAGNOSIS — N898 Other specified noninflammatory disorders of vagina: Secondary | ICD-10-CM | POA: Diagnosis present

## 2016-05-09 DIAGNOSIS — B9689 Other specified bacterial agents as the cause of diseases classified elsewhere: Secondary | ICD-10-CM
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
## 2016-05-10 LAB — URINALYSIS, ROUTINE W REFLEX MICROSCOPIC
BILIRUBIN URINE: NEGATIVE
Bacteria, UA: NONE SEEN
GLUCOSE, UA: NEGATIVE mg/dL
HGB URINE DIPSTICK: NEGATIVE
KETONES UR: NEGATIVE mg/dL
NITRITE: NEGATIVE
PH: 6 (ref 5.0–8.0)
Protein, ur: NEGATIVE mg/dL
Specific Gravity, Urine: 1.023 (ref 1.005–1.030)

## 2016-05-10 LAB — WET PREP, GENITAL
SPERM: NONE SEEN
Trich, Wet Prep: NONE SEEN
Yeast Wet Prep HPF POC: NONE SEEN

## 2016-05-10 LAB — PREGNANCY, URINE: PREG TEST UR: NEGATIVE

## 2016-05-10 MED ORDER — METRONIDAZOLE 500 MG PO TABS: 500.0000 mg | ORAL_TABLET | Freq: Two times a day (BID) | ORAL | 0 refills | Status: DC
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
## 2016-05-11 LAB — GC/CHLAMYDIA PROBE AMP (~~LOC~~) NOT AT ARMC
CHLAMYDIA, DNA PROBE: NEGATIVE
NEISSERIA GONORRHEA: NEGATIVE

## 2016-06-21 ENCOUNTER — Encounter (HOSPITAL_COMMUNITY): Payer: Self-pay | Admitting: Emergency Medicine

## 2016-06-21 ENCOUNTER — Emergency Department (HOSPITAL_COMMUNITY)
Admission: EM | Admit: 2016-06-21 | Discharge: 2016-06-21 | Disposition: A | Discharge status: Home / Self Care | Payer: Medicaid Other | Attending: Dermatology | Admitting: Dermatology

## 2016-06-21 DIAGNOSIS — Z87891 Personal history of nicotine dependence: Secondary | ICD-10-CM | POA: Diagnosis not present

## 2016-06-21 DIAGNOSIS — Z79899 Other long term (current) drug therapy: Secondary | ICD-10-CM | POA: Diagnosis not present

## 2016-06-21 DIAGNOSIS — S0990XA Unspecified injury of head, initial encounter: Secondary | ICD-10-CM | POA: Diagnosis present

## 2016-06-21 DIAGNOSIS — S299XXA Unspecified injury of thorax, initial encounter: Secondary | ICD-10-CM | POA: Insufficient documentation

## 2016-06-21 DIAGNOSIS — F1290 Cannabis use, unspecified, uncomplicated: Secondary | ICD-10-CM | POA: Diagnosis not present

## 2016-06-21 DIAGNOSIS — Y939 Activity, unspecified: Secondary | ICD-10-CM | POA: Diagnosis not present

## 2016-06-21 DIAGNOSIS — Y929 Unspecified place or not applicable: Secondary | ICD-10-CM | POA: Insufficient documentation

## 2016-06-21 DIAGNOSIS — Z5321 Procedure and treatment not carried out due to patient leaving prior to being seen by health care provider: Secondary | ICD-10-CM | POA: Diagnosis not present

## 2016-06-21 DIAGNOSIS — F1490 Cocaine use, unspecified, uncomplicated: Secondary | ICD-10-CM | POA: Diagnosis not present

## 2016-06-21 DIAGNOSIS — S0083XA Contusion of other part of head, initial encounter: Secondary | ICD-10-CM | POA: Diagnosis not present

## 2016-06-21 DIAGNOSIS — Y999 Unspecified external cause status: Secondary | ICD-10-CM | POA: Diagnosis not present
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
## 2016-11-12 ENCOUNTER — Emergency Department (HOSPITAL_COMMUNITY)
Admission: EM | Admit: 2016-11-12 | Discharge: 2016-11-12 | Disposition: A | Discharge status: Home / Self Care | Payer: Medicaid Other

## 2016-11-12 NOTE — ED Notes (Signed)
PT called x3 from triage with no answer. 

## 2016-11-12 NOTE — ED Notes (Signed)
No answer when called to triage.

## 2016-11-12 NOTE — ED Notes (Signed)
PT called from waiting room x2 with no answer.

## 2016-12-16 ENCOUNTER — Emergency Department (HOSPITAL_COMMUNITY)
Admission: EM | Admit: 2016-12-16 | Discharge: 2016-12-16 | Disposition: A | Discharge status: Home / Self Care | Payer: Medicaid Other | Attending: Emergency Medicine | Admitting: Emergency Medicine

## 2016-12-16 ENCOUNTER — Encounter (HOSPITAL_COMMUNITY): Payer: Self-pay | Admitting: Emergency Medicine

## 2016-12-16 DIAGNOSIS — R102 Pelvic and perineal pain: Secondary | ICD-10-CM | POA: Insufficient documentation

## 2016-12-16 DIAGNOSIS — B3731 Acute candidiasis of vulva and vagina: Secondary | ICD-10-CM

## 2016-12-16 DIAGNOSIS — B373 Candidiasis of vulva and vagina: Secondary | ICD-10-CM

## 2016-12-16 DIAGNOSIS — Z87891 Personal history of nicotine dependence: Secondary | ICD-10-CM | POA: Diagnosis not present

## 2016-12-16 DIAGNOSIS — Z79899 Other long term (current) drug therapy: Secondary | ICD-10-CM | POA: Diagnosis not present

## 2016-12-16 LAB — CBC WITH DIFFERENTIAL/PLATELET
BASOS ABS: 0 10*3/uL (ref 0.0–0.1)
BASOS PCT: 0 %
EOS ABS: 0.1 10*3/uL (ref 0.0–0.7)
EOS PCT: 1 %
HCT: 39.2 % (ref 36.0–46.0)
Hemoglobin: 13.1 g/dL (ref 12.0–15.0)
Lymphocytes Relative: 29 %
Lymphs Abs: 2.4 10*3/uL (ref 0.7–4.0)
MCH: 29.6 pg (ref 26.0–34.0)
MCHC: 33.4 g/dL (ref 30.0–36.0)
MCV: 88.7 fL (ref 78.0–100.0)
MONO ABS: 0.4 10*3/uL (ref 0.1–1.0)
Monocytes Relative: 4 %
Neutro Abs: 5.6 10*3/uL (ref 1.7–7.7)
Neutrophils Relative %: 66 %
PLATELETS: 308 10*3/uL (ref 150–400)
RBC: 4.42 MIL/uL (ref 3.87–5.11)
RDW: 13.3 % (ref 11.5–15.5)
WBC: 8.4 10*3/uL (ref 4.0–10.5)

## 2016-12-16 LAB — BASIC METABOLIC PANEL
Anion gap: 10 (ref 5–15)
BUN: 16 mg/dL (ref 6–20)
CHLORIDE: 101 mmol/L (ref 101–111)
CO2: 26 mmol/L (ref 22–32)
Calcium: 10 mg/dL (ref 8.9–10.3)
Creatinine, Ser: 0.79 mg/dL (ref 0.44–1.00)
GFR calc Af Amer: >60 mL/min (ref >60)
GFR calc non Af Amer: >60 mL/min (ref >60)
Glucose, Bld: 127 mg/dL — ABNORMAL HIGH (ref 65–99)
POTASSIUM: 3.5 mmol/L (ref 3.5–5.1)
SODIUM: 137 mmol/L (ref 135–145)

## 2016-12-16 LAB — POC URINE PREG, ED: PREG TEST UR: NEGATIVE

## 2016-12-16 LAB — WET PREP, GENITAL
Sperm: NONE SEEN
TRICH WET PREP: NONE SEEN

## 2016-12-16 LAB — URINALYSIS, ROUTINE W REFLEX MICROSCOPIC
BILIRUBIN URINE: NEGATIVE
Glucose, UA: NEGATIVE mg/dL
KETONES UR: NEGATIVE mg/dL
LEUKOCYTES UA: NEGATIVE
Nitrite: NEGATIVE
PH: 5 (ref 5.0–8.0)
PROTEIN: NEGATIVE mg/dL
Specific Gravity, Urine: 1.011 (ref 1.005–1.030)

## 2016-12-16 MED ORDER — FLUCONAZOLE 100 MG PO TABS
150.0000 mg | ORAL_TABLET | Freq: Once | ORAL | Status: AC
  Administered 2016-12-16: 150 mg via ORAL
  Filled 2016-12-16: qty 2

## 2016-12-16 MED ORDER — TRAMADOL HCL 50 MG PO TABS
50.0000 mg | ORAL_TABLET | Freq: Once | ORAL | Status: AC
  Administered 2016-12-16: 50 mg via ORAL
  Filled 2016-12-16: qty 1

## 2016-12-16 MED ORDER — TRAMADOL HCL 50 MG PO TABS: 50.0000 mg | ORAL_TABLET | Freq: Four times a day (QID) | ORAL | 0 refills | Status: DC | PRN

## 2016-12-16 NOTE — ED Notes (Signed)
Pt ambulatory to waiting room. Pt verbalized understanding of discharge instructions.   

## 2016-12-16 NOTE — Discharge Instructions (Signed)
Your pregnancy test is negative. The pain is probably from a cyst on your ovary. Please return for an ultrasound to confirm this. Follow up with your OB/GYN provider.

## 2016-12-16 NOTE — ED Provider Notes (Signed)
[facility] DEPT Provider Note   CSN: [id number] Arrival date & time: 12/16/16  0018     History   Chief Complaint Chief Complaint  Patient presents with  . Abdominal Pain    HPI Patricia Richards is a 26 y.o. female.  The history is provided by the patient.  She complains of right suprapubic pain for the last 2 days. Nothing makes pain better, nothing makes it worse. She rates pain at 7/10. Today, she has had some nausea and lightheadedness. There has been no vaginal bleeding or discharge. She is worried that she may be pregnant. She has been taking pregnancy tests for the last 2 weeks with some being positive, some being negative. Last menses was July 4, but was much less bleeding than normal. Last normal menses was June 3. She has had some breast soreness but no swelling. She has not had any urinary frequency. She is gravida 4, para 3 with one voluntary interruption of pregnancy. She is taken acetaminophen and ibuprofen for pain with modest relief. She is requesting a blood pregnancy test.  Past Medical History:  Diagnosis Date  . 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 Anemia   . Bacterial infection   . Depression    on meds now, doing well  . Infection    UTI  . Kidney stones   . 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 Panic attacks   . Pregnant   . Urinary tract infection    gets them frequently  . Vaginal Pap smear, abnormal     Patient Active Problem List   Diagnosis Date Noted  . Suspected Fetal macrosomia during pregnancy in third trimester, antepartum 11/20/2015  . Pyelectasis of fetus on prenatal ultrasound 07/01/2015  . 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 Chlamydia infection affecting pregnancy in first trimester, antepartum 05/20/2015  . Depression with anxiety 05/13/2015  . Hallucinations 05/13/2015  . Supervision of other high-risk pregnancy 05/13/2015  . History  of suicidal ideation 05/13/2015  . Alcohol use affecting pregnancy, antepartum 03/18/2015  . BV (bacterial vaginosis) 11/13/2013  . Rh negative, antepartum (A neg blood type) 08/28/2013    Past Surgical History:  Procedure Laterality Date  . DILATION AND EVACUATION  11/28/2010    OB History    Gravida Para Term Preterm AB Living   4 3 3  0 1 3   SAB TAB Ectopic Multiple Live Births   0 1 0 0 3       Home Medications    Prior to Admission medications   Medication Sig Start Date End Date Taking? Authorizing Provider  amoxicillin-clavulanate (AUGMENTIN) 875-125 MG tablet Take 1 tablet by mouth every 12 (twelve) hours. 01/15/16   Francine Graven, DO  ARIPiprazole (ABILIFY) 5 MG tablet Take 5 mg by mouth daily.    [provider]  clonazePAM (KLONOPIN) 1 MG tablet Take 1 tablet (1 mg total) by mouth 2 (two) times daily. Patient taking differently: Take 1 mg by mouth 2 (two) times daily.  12/15/15   Florian Buff, MD  FLUoxetine (PROZAC) 10 MG tablet Take 10 mg by mouth daily.    [provider]  metroNIDAZOLE (FLAGYL) 500 MG tablet Take 1 tablet (500 mg total) by mouth 2 (two) times daily. 05/10/16   Fransico Meadow, PA-C  ondansetron (ZOFRAN) 4 MG tablet Take 1 tablet (4 mg total) by mouth every 8 (eight)  hours as needed for nausea or vomiting. 01/15/16   Francine Graven, DO    Family History Family History  Problem Relation Age of Onset  . Hypertension Father   . Cancer Father        skin  . Mental illness Father   . Cancer Maternal Grandmother   . Mental illness Maternal Grandmother   . Cerebral palsy Mother   . Other Brother        problems with gums  . Heart attack Paternal Grandmother     Social History Social History  Substance Use Topics  . Smoking status: Former Smoker    Types: Cigarettes    Quit date: 02/14/2014  . Smokeless tobacco: Never Used  . Alcohol use 0.0 oz/week     Comment: occasionally      Allergies    Metronidazole   Review of Systems Review of Systems  All other systems reviewed and are negative.    Physical Exam Updated Vital Signs BP 136/87 (BP Location: Right Arm)   Pulse (!) 114   Temp 98.3 F (36.8 C) (Oral)   Resp 17   Ht 5\' 4"  (1.626 m)   Wt 57.2 kg (126 lb)   LMP 11/17/2016   SpO2 99%   BMI 21.63 kg/m   Physical Exam  Nursing note and vitals reviewed.  26 year old female, resting comfortably and in no acute distress. Vital signs are significant for mild tachycardia. Oxygen saturation is 99%, which is normal. Head is normocephalic and atraumatic. PERRLA, EOMI. Oropharynx is clear. Neck is nontender and supple without adenopathy or JVD. Back is nontender and there is no CVA tenderness. Lungs are clear without rales, wheezes, or rhonchi. Chest is nontender. Heart has regular rate and rhythm without murmur. Abdomen is soft, flat, with mild to moderate right suprapubic tenderness. There is no rebound or guarding. There are no masses or hepatosplenomegaly and peristalsis is normoactive. Pelvic: Normal external female genitalia. Small amount of white vaginal discharge present. Cervix appears normal without Chadwick's sign. There is no cervical motion tenderness. Fundus is normal size and position. No left adnexal masses or tenderness. Moderate right adnexal tenderness with normal size ovary. Extremities have no cyanosis or edema, full range of motion is present. Skin is warm and dry without rash. Neurologic: Mental status is normal, cranial nerves are intact, there are no motor or sensory deficits.  ED Treatments / Results  Labs (all labs ordered are listed, but only abnormal results are displayed) Labs Reviewed  WET PREP, GENITAL - Abnormal; Notable for the following:       Result Value   Yeast Wet Prep HPF POC PRESENT (*)    Clue Cells Wet Prep HPF POC PRESENT (*)    WBC, Wet Prep HPF POC FEW (*)    All other components within normal limits  URINALYSIS,  ROUTINE W REFLEX MICROSCOPIC - Abnormal; Notable for the following:    Hgb urine dipstick SMALL (*)    Bacteria, UA FEW (*)    Squamous Epithelial / LPF 0-5 (*)    All other components within normal limits  BASIC METABOLIC PANEL - Abnormal; Notable for the following:    Glucose, Bld 127 (*)    All other components within normal limits  CBC WITH DIFFERENTIAL/PLATELET  RPR  HIV ANTIBODY (ROUTINE TESTING)  POC URINE PREG, ED  GC/CHLAMYDIA PROBE AMP ([place]) NOT AT [facility]   Procedures Procedures (including critical care time)  Medications Ordered in ED Medications  fluconazole (DIFLUCAN) tablet  150 mg (not administered)  traMADol (ULTRAM) tablet 50 mg (not administered)     Initial Impression / Assessment and Plan / ED Course  I have reviewed the triage vital signs and the nursing notes.  Pertinent labs & imaging results that were available during my care of the patient were reviewed by me and considered in my medical decision making (see chart for details).  Pelvic pain of uncertain cause. Doubt pregnancy given 2 weeks since first equivocal pregnancy test. HCG level should be doubling every 3 days in pregnancy, so pregnancy tests 1 week after initial equivocal test should be positive if she is pregnant. Pregnancy test here is negative. I do not see an indication for quantitative hCG measurement. Old records are reviewed, and she has no relevant past visits. We'll check a screening labs and STI panel.  Wet prep is positive for clue cells and yeast. She does not have bacterial vaginosis clinically, and is allergic to metronidazole. Will defer treatment for clue cells to her gynecologist. She is given a dose of fluconazole in the ED. She is discharged with prescription for tramadol and is to return for pelvic ultrasound.  Final Clinical Impressions(s) / ED Diagnoses   Final diagnoses:  Pelvic pain in female  Monilial vaginitis    New Prescriptions New Prescriptions   TRAMADOL  (ULTRAM) 50 MG TABLET    Take 1 tablet (50 mg total) by mouth every 6 (six) hours as needed.     Delora Fuel, MD 54/62/70 [phone]

## 2016-12-16 NOTE — ED Triage Notes (Signed)
Pt C/O abdominal pain that started 2 days ago. Pt states she has taken 2 home pregnancy tests, one reading positive and another negative. Pt states she wants to "find out if I am pregnant."

## 2016-12-17 LAB — RPR: RPR Ser Ql: NONREACTIVE

## 2016-12-17 LAB — GC/CHLAMYDIA PROBE AMP (~~LOC~~) NOT AT ARMC
Chlamydia: NEGATIVE
NEISSERIA GONORRHEA: NEGATIVE

## 2016-12-17 LAB — HIV ANTIBODY (ROUTINE TESTING W REFLEX): HIV Screen 4th Generation wRfx: NONREACTIVE

## 2016-12-21 ENCOUNTER — Encounter (HOSPITAL_COMMUNITY): Payer: Self-pay | Admitting: *Deleted

## 2016-12-21 ENCOUNTER — Inpatient Hospital Stay (HOSPITAL_COMMUNITY): Payer: Medicaid Other

## 2016-12-21 ENCOUNTER — Inpatient Hospital Stay (HOSPITAL_COMMUNITY)
Admission: AD | Admit: 2016-12-21 | Discharge: 2016-12-21 | Disposition: A | Discharge status: Home / Self Care | Payer: Medicaid Other | Source: Ambulatory Visit | Attending: Family Medicine | Admitting: Family Medicine

## 2016-12-21 DIAGNOSIS — O99331 Smoking (tobacco) complicating pregnancy, first trimester: Secondary | ICD-10-CM | POA: Insufficient documentation

## 2016-12-21 DIAGNOSIS — R1031 Right lower quadrant pain: Secondary | ICD-10-CM | POA: Diagnosis present

## 2016-12-21 DIAGNOSIS — Z3A01 Less than 8 weeks gestation of pregnancy: Secondary | ICD-10-CM | POA: Insufficient documentation

## 2016-12-21 DIAGNOSIS — O26891 Other specified pregnancy related conditions, first trimester: Secondary | ICD-10-CM | POA: Insufficient documentation

## 2016-12-21 DIAGNOSIS — F17210 Nicotine dependence, cigarettes, uncomplicated: Secondary | ICD-10-CM | POA: Diagnosis not present

## 2016-12-21 DIAGNOSIS — R102 Pelvic and perineal pain: Secondary | ICD-10-CM

## 2016-12-21 LAB — CBC
HCT: 35.3 % — ABNORMAL LOW (ref 36.0–46.0)
Hemoglobin: 11.6 g/dL — ABNORMAL LOW (ref 12.0–15.0)
MCH: 29.4 pg (ref 26.0–34.0)
MCHC: 32.9 g/dL (ref 30.0–36.0)
MCV: 89.4 fL (ref 78.0–100.0)
PLATELETS: 291 10*3/uL (ref 150–400)
RBC: 3.95 MIL/uL (ref 3.87–5.11)
RDW: 13.9 % (ref 11.5–15.5)
WBC: 7.9 10*3/uL (ref 4.0–10.5)

## 2016-12-21 LAB — POCT PREGNANCY, URINE: PREG TEST UR: POSITIVE — AB

## 2016-12-21 LAB — URINALYSIS, ROUTINE W REFLEX MICROSCOPIC
BILIRUBIN URINE: NEGATIVE
Glucose, UA: NEGATIVE mg/dL
Hgb urine dipstick: NEGATIVE
KETONES UR: NEGATIVE mg/dL
LEUKOCYTES UA: NEGATIVE
NITRITE: NEGATIVE
Protein, ur: NEGATIVE mg/dL
SPECIFIC GRAVITY, URINE: 1.019 (ref 1.005–1.030)
pH: 6 (ref 5.0–8.0)

## 2016-12-21 LAB — HCG, QUANTITATIVE, PREGNANCY: hCG, Beta Chain, Quant, S: 567 m[IU]/mL — ABNORMAL HIGH (ref <5)

## 2016-12-21 NOTE — MAU Note (Signed)
States been to 2 different hospitals ([facility] and [facility])  Found out was pregnant States blood count was 100; unsure  +right lower abdominal pain Radiates down leg Tender Rating pain 7/10 Has tried tylenol and ibuprofen with no relief  +dizzy/light headed +fatigue  LMP 7/4-7/11

## 2016-12-21 NOTE — MAU Provider Note (Signed)
Chief Complaint: Abdominal Pain; Dizziness; Fatigue; and Possible Pregnancy   First Provider Initiated Contact with Patient 12/21/16 1912        SUBJECTIVE HPI: Patricia Richards is a 26 y.o. [id number] at [redacted]w[redacted]d  by LMP who presents to maternity admissions reporting pain in right lower abdoment for a week or two.  Has been seen at two ERs but did not like what they told her.  States in [place] they told her she might have a tubal pregnancy.  Had an HCG at one but not both hospitals. Thinks HCG might have been 100. Marland Kitchen She denies vaginal bleeding, vaginal itching/burning, urinary symptoms, h/a, dizziness, n/v, or fever/chills.    Abdominal Pain  This is a new problem. The current episode started in the past 7 days. The onset quality is gradual. The problem occurs intermittently. The problem has been unchanged. The pain is located in the RLQ. The pain is moderate. The quality of the pain is cramping. The abdominal pain does not radiate. Pertinent negatives include no constipation, diarrhea, dysuria, fever, frequency, headaches, myalgias or nausea. The pain is aggravated by palpation. The pain is relieved by nothing. She has tried nothing for the symptoms.  Possible Pregnancy  This is a new problem. The current episode started in the past 7 days. The problem occurs constantly. The problem has been unchanged. Associated symptoms include abdominal pain. Pertinent negatives include no chest pain, chills, congestion, coughing, fever, headaches, myalgias or nausea. Nothing aggravates the symptoms. She has tried nothing for the symptoms.   RN note: States been to 2 different hospitals ([place] and [facility])  Found out was pregnant States blood count was 100; unsure +right lower abdominal pain Radiates down leg    Tender  Rating pain 7/10   Has tried tylenol and ibuprofen with no relief +dizzy/light headed   +fatigue    LMP 7/4-7/11  Past Medical History:  Diagnosis Date  . Alcohol abuse affecting  pregnancy in first trimester 03/18/2015  . Anemia   . Bacterial infection   . Depression    on meds now, doing well  . Infection    UTI  . Kidney stones   . 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 Panic attacks   . Pregnant   . Urinary tract infection    gets them frequently  . Vaginal Pap smear, abnormal    Past Surgical History:  Procedure Laterality Date  . DILATION AND EVACUATION  11/28/2010   Social History   Social History  . Marital status: Single    Spouse name: N/A  . Number of children: N/A  . Years of education: N/A   Occupational History  . Not on file.   Social History Main Topics  . Smoking status: Current Some Day Smoker    Types: Cigarettes    Last attempt to quit: 02/14/2014  . Smokeless tobacco: Never Used  . Alcohol use No     Comment: occasionally   . Drug use: No     Comment: pt states she hasnt used in 3 mo  . Sexual activity: Yes    Birth control/ protection: None   Other Topics Concern  . Not on file   Social History Narrative  . No narrative on file   No current facility-administered medications on file prior to encounter.    Current Outpatient Prescriptions on File Prior to Encounter  Medication Sig Dispense Refill  . clonazePAM (KLONOPIN)  1 MG tablet Take 1 tablet (1 mg total) by mouth 2 (two) times daily. (Patient taking differently: Take 1 mg by mouth 2 (two) times daily. ) 28 tablet 0  . FLUoxetine (PROZAC) 10 MG tablet Take 10 mg by mouth daily.    . ARIPiprazole (ABILIFY) 5 MG tablet Take 5 mg by mouth daily.    . ondansetron (ZOFRAN) 4 MG tablet Take 1 tablet (4 mg total) by mouth every 8 (eight) hours as needed for nausea or vomiting. 6 tablet 0  . traMADol (ULTRAM) 50 MG tablet Take 1 tablet (50 mg total) by mouth every 6 (six) hours as needed. 15 tablet 0   Allergies  Allergen Reactions  . Metronidazole Shortness Of Breath    I have reviewed patient's Past Medical Hx,  Surgical Hx, Family Hx, Social Hx, medications and allergies.   ROS:  Review of Systems  Constitutional: Negative for chills and fever.  HENT: Negative for congestion.   Respiratory: Negative for cough.   Cardiovascular: Negative for chest pain.  Gastrointestinal: Positive for abdominal pain. Negative for constipation, diarrhea and nausea.  Genitourinary: Negative for dysuria and frequency.  Musculoskeletal: Negative for myalgias.  Neurological: Negative for headaches.   Review of Systems  Other systems negative   Physical Exam  Physical Exam Patient Vitals for the past 24 hrs:  BP Temp Temp src Pulse Resp SpO2 Weight  12/21/16 1851 124/73 97.9 F (36.6 C) Oral 100 17 99 % 132 lb 0.6 oz (59.9 kg)   Constitutional: Well-developed, well-nourished female in no acute distress.  Cardiovascular: normal rate Respiratory: normal effort GI: Abd soft, non-tender. Pos BS x 4 MS: Extremities nontender, no edema, normal ROM Neurologic: Alert and oriented x 4.  GU: Neg CVAT.  PELVIC EXAM: Cervix pink, visually closed, without lesion, scant white creamy discharge, vaginal walls and external genitalia normal Bimanual exam: Cervix 0/long/high, firm, anterior, neg CMT, uterus nontender, nonenlarged, adnexa without tenderness, enlargement, or mass on left.  Tender on right ovary which lies on top of uterus to maternal right.     LAB RESULTS Results for orders placed or performed during the hospital encounter of 12/21/16 (from the past 24 hour(s))  Urinalysis, Routine w reflex microscopic     Status: Abnormal   Collection Time: 12/21/16  6:48 PM  Result Value Ref Range   Color, Urine YELLOW YELLOW   APPearance HAZY (A) CLEAR   Specific Gravity, Urine 1.019 1.005 - 1.030   pH 6.0 5.0 - 8.0   Glucose, UA NEGATIVE NEGATIVE mg/dL   Hgb urine dipstick NEGATIVE NEGATIVE   Bilirubin Urine NEGATIVE NEGATIVE   Ketones, ur NEGATIVE NEGATIVE mg/dL   Protein, ur NEGATIVE NEGATIVE mg/dL   Nitrite  NEGATIVE NEGATIVE   Leukocytes, UA NEGATIVE NEGATIVE  Pregnancy, urine POC     Status: Abnormal   Collection Time: 12/21/16  7:07 PM  Result Value Ref Range   Preg Test, Ur POSITIVE (A) NEGATIVE  CBC     Status: Abnormal   Collection Time: 12/21/16  7:35 PM  Result Value Ref Range   WBC 7.9 4.0 - 10.5 K/uL   RBC 3.95 3.87 - 5.11 MIL/uL   Hemoglobin 11.6 (L) 12.0 - 15.0 g/dL   HCT 35.3 (L) 36.0 - 46.0 %   MCV 89.4 78.0 - 100.0 fL   MCH 29.4 26.0 - 34.0 pg   MCHC 32.9 30.0 - 36.0 g/dL   RDW 13.9 11.5 - 15.5 %   Platelets 291 150 - 400 K/uL  hCG, quantitative, pregnancy     Status: Abnormal   Collection Time: 12/21/16  7:35 PM  Result Value Ref Range   hCG, Beta Chain, Quant, S 567 (H) <5 mIU/mL       IMAGING US Ob Comp Less 14 Wks  Result Date: 12/21/2016 CLINICAL DATA:  Pain with positive pregnancy test EXAM: OBSTETRIC <14 WK [place] AND TRANSVAGINAL OB US TECHNIQUE: Both transabdominal and transvaginal ultrasound examinations were performed for complete evaluation of the gestation as well as the maternal uterus, adnexal regions, and pelvic cul-de-sac. Transvaginal technique was performed to assess early pregnancy. COMPARISON:  None. FINDINGS: Intrauterine gestational sac: Not visualized Yolk sac:  Not visualized Embryo:  Not visualized Cardiac Activity: Not visualized Subchorionic hemorrhage:  None visualized. Uterus/adnexae: Cervical os is closed. No intrauterine mass. A small amount of fluid is noted within the endometrium. Right ovary measures 4.0 x 2.0 x 2.5 cm. Left ovary measures 4.2 x 2.6 x 2.9 cm. No free pelvic fluid. IMPRESSION: No intrauterine gestation. Fluid seen within the endometrium. No intrauterine or extrauterine pelvic or adnexal mass. No free pelvic fluid. Given positive pregnancy test, differential considerations in this circumstance include intrauterine gestation too early to be seen by either transabdominal or transvaginal technique; recent spontaneous abortion;  potential ectopic gestation. Given this circumstance, close clinical and laboratory surveillance advised. Timing of repeat ultrasound in part will depend on beta HCG values going forward. Electronically Signed   By: Lowella Grip III M.D.   On: 12/21/2016 20:53   US Ob Transvaginal  Result Date: 12/21/2016 CLINICAL DATA:  Pain with positive pregnancy test EXAM: OBSTETRIC <14 WK [place] AND TRANSVAGINAL OB US TECHNIQUE: Both transabdominal and transvaginal ultrasound examinations were performed for complete evaluation of the gestation as well as the maternal uterus, adnexal regions, and pelvic cul-de-sac. Transvaginal technique was performed to assess early pregnancy. COMPARISON:  None. FINDINGS: Intrauterine gestational sac: Not visualized Yolk sac:  Not visualized Embryo:  Not visualized Cardiac Activity: Not visualized Subchorionic hemorrhage:  None visualized. Uterus/adnexae: Cervical os is closed. No intrauterine mass. A small amount of fluid is noted within the endometrium. Right ovary measures 4.0 x 2.0 x 2.5 cm. Left ovary measures 4.2 x 2.6 x 2.9 cm. No free pelvic fluid. IMPRESSION: No intrauterine gestation. Fluid seen within the endometrium. No intrauterine or extrauterine pelvic or adnexal mass. No free pelvic fluid. Given positive pregnancy test, differential considerations in this circumstance include intrauterine gestation too early to be seen by either transabdominal or transvaginal technique; recent spontaneous abortion; potential ectopic gestation. Given this circumstance, close clinical and laboratory surveillance advised. Timing of repeat ultrasound in part will depend on beta HCG values going forward. Electronically Signed   By: Lowella Grip III M.D.   On: 12/21/2016 20:53    MAU Management/MDM: Ordered usual first trimester r/o ectopic labs.   Pelvic exam and cultures done Will check baseline Ultrasound to rule out ectopic.  This bleeding/pain can represent a normal pregnancy  with bleeding, spontaneous abortion or even an ectopic which can be life-threatening.  The process as listed above helps to determine which of these is present.  Discussed we cannot rule out ectopic yet.  Repeat HCG on Thursday   ASSESSMENT No diagnosis found.  PLAN Discharge home Plan to repeat HCG level in 48 hours in clinic per 11:00 am schedule Will repeat  Ultrasound in about 7-10 days if HCG levels double appropriately  Ectopic precautions   Pt stable at time of discharge. Encouraged to return here or  to other Urgent Care/ED if she develops worsening of symptoms, increase in pain, fever, or other concerning symptoms.    Hansel Feinstein CNM, MSN Certified Nurse-Midwife 12/21/2016  7:12 PM

## 2016-12-21 NOTE — Discharge Instructions (Signed)

## 2016-12-23 ENCOUNTER — Ambulatory Visit: Payer: Self-pay | Admitting: General Practice

## 2016-12-23 ENCOUNTER — Telehealth: Payer: Self-pay | Admitting: General Practice

## 2016-12-23 DIAGNOSIS — O3680X0 Pregnancy with inconclusive fetal viability, not applicable or unspecified: Secondary | ICD-10-CM

## 2016-12-23 LAB — HCG, QUANTITATIVE, PREGNANCY: HCG, BETA CHAIN, QUANT, S: 1108 m[IU]/mL — AB (ref <5)

## 2016-12-23 NOTE — Progress Notes (Signed)
Patient here for stat bhcg today. Patient denies pain or bleeding. Discussed having her wait in lobby for results. Patient states she cannot wait as she has to return to work. Patient provided reliable phone number of [phone] and is prepared to come back if need be. Spoke with Dr Ihor Dow regarding results, who recommends follow up stat bhcg, ultrasound, & provider visit next week. Scheduled ultrasound for 8/16 @ 12:45pm. Will call patient with results & plan of care

## 2016-12-23 NOTE — Telephone Encounter (Signed)
Called patient & informed her of results, ultrasound appt, & updated plan of care. Patient verbalized understanding & had no questions

## 2016-12-30 ENCOUNTER — Ambulatory Visit (HOSPITAL_COMMUNITY)
Admission: RE | Admit: 2016-12-30 | Discharge: 2016-12-30 | Disposition: A | Discharge status: Home / Self Care | Payer: Medicaid Other | Source: Ambulatory Visit | Attending: Obstetrics & Gynecology | Admitting: Obstetrics & Gynecology

## 2016-12-30 ENCOUNTER — Ambulatory Visit: Payer: Self-pay | Admitting: Family Medicine

## 2016-12-30 ENCOUNTER — Encounter: Payer: Self-pay | Admitting: *Deleted

## 2016-12-30 DIAGNOSIS — O284 Abnormal radiological finding on antenatal screening of mother: Secondary | ICD-10-CM | POA: Diagnosis present

## 2016-12-30 DIAGNOSIS — Z3A01 Less than 8 weeks gestation of pregnancy: Secondary | ICD-10-CM | POA: Diagnosis not present

## 2016-12-30 DIAGNOSIS — O3680X0 Pregnancy with inconclusive fetal viability, not applicable or unspecified: Secondary | ICD-10-CM

## 2016-12-30 DIAGNOSIS — Z3490 Encounter for supervision of normal pregnancy, unspecified, unspecified trimester: Secondary | ICD-10-CM

## 2016-12-30 LAB — HCG, QUANTITATIVE, PREGNANCY: HCG, BETA CHAIN, QUANT, S: 26999 m[IU]/mL — AB (ref <5)

## 2016-12-30 NOTE — Progress Notes (Signed)
Patient in for stat hcg level and ultrasound results. Dr. Nehemiah Settle reviewed patients chart and advised that beta has risen appropriately. She needs another ultrasound in 11 days. Patient is agreeable to this. Pt scheduled to return on 8/27 at 1245 for ultrasound and will have nurse visit afterwards for results.

## 2017-01-10 ENCOUNTER — Ambulatory Visit: Payer: Medicaid Other | Admitting: Adult Health

## 2017-01-10 ENCOUNTER — Ambulatory Visit: Payer: Medicaid Other | Admitting: *Deleted

## 2017-01-10 ENCOUNTER — Ambulatory Visit (HOSPITAL_COMMUNITY)
Admission: RE | Admit: 2017-01-10 | Discharge: 2017-01-10 | Disposition: A | Discharge status: Home / Self Care | Payer: Medicaid Other | Source: Ambulatory Visit | Attending: Family Medicine | Admitting: Family Medicine

## 2017-01-10 DIAGNOSIS — Z3A01 Less than 8 weeks gestation of pregnancy: Secondary | ICD-10-CM | POA: Diagnosis not present

## 2017-01-10 DIAGNOSIS — Z3490 Encounter for supervision of normal pregnancy, unspecified, unspecified trimester: Secondary | ICD-10-CM | POA: Diagnosis present

## 2017-01-10 DIAGNOSIS — O209 Hemorrhage in early pregnancy, unspecified: Secondary | ICD-10-CM | POA: Diagnosis not present

## 2017-01-10 DIAGNOSIS — O3680X0 Pregnancy with inconclusive fetal viability, not applicable or unspecified: Secondary | ICD-10-CM

## 2017-01-10 NOTE — Progress Notes (Signed)
Apparently patient arrived to office before [place] and was sent to [place] for exam.  Then sent back for results.Reviewed results with Kerry Hough, PA-c . Gave patient results of [place] confirming pregnancy but showing moderate subchorionic hemorrhage.  Given pelvic rest precautions verbally and in patient instructions. No heavy lifting. She voices understanding. She plans to follow up ob care with [facility].

## 2017-01-10 NOTE — Patient Instructions (Addendum)
[place] Pelvic Rest Pelvic rest may be recommended if:  Your placenta is partially or completely covering the opening of your cervix (placenta previa).  There is bleeding between the wall of the uterus and the amniotic sac in the first trimester of pregnancy (subchorionic hemorrhage).  You went into labor too early (preterm labor).  Based on your overall health and the health of your baby, your health care provider will decide if pelvic rest is right for you. How do I rest my pelvis? For as long as told by your health care provider:  Do not have sex, sexual stimulation, or an orgasm.  Do not use tampons. Do not douche. Do not put anything in your vagina.  Do not lift anything that is heavier than 10 lb (4.5 kg).  Avoid activities that take a lot of effort (are strenuous).  Avoid any activity in which your pelvic muscles could become strained.  When should I seek medical care? Seek medical care if you have:  Cramping pain in your lower abdomen.  Vaginal discharge.  A low, dull backache.  Regular contractions.  Uterine tightening.  When should I seek immediate medical care? Seek immediate medical care if:  You have vaginal bleeding and you are pregnant.  This information is not intended to replace advice given to you by your health care provider. Make sure you discuss any questions you have with your health care provider. Document Released: 08/28/2010 Document Revised: 10/09/2015 Document Reviewed: 11/04/2014 Elsevier Interactive Patient Education  [employer].

## 2017-01-10 NOTE — Progress Notes (Signed)
Patient ID: Patricia Richards, female   DOB: 1990-07-25, 26 y.o.   MRN: [id number]  Agree with plan of case. [place] reviewed with RN.   Luvenia Redden, PA-C 01/10/2017 3:40 PM

## 2017-01-18 ENCOUNTER — Emergency Department (HOSPITAL_COMMUNITY)
Admission: EM | Admit: 2017-01-18 | Discharge: 2017-01-18 | Disposition: A | Discharge status: Home / Self Care | Payer: Medicaid Other | Attending: Emergency Medicine | Admitting: Emergency Medicine

## 2017-01-18 ENCOUNTER — Emergency Department (HOSPITAL_COMMUNITY): Payer: Medicaid Other

## 2017-01-18 ENCOUNTER — Encounter (HOSPITAL_COMMUNITY): Payer: Self-pay | Admitting: *Deleted

## 2017-01-18 ENCOUNTER — Other Ambulatory Visit: Payer: Medicaid Other

## 2017-01-18 DIAGNOSIS — R109 Unspecified abdominal pain: Secondary | ICD-10-CM | POA: Insufficient documentation

## 2017-01-18 DIAGNOSIS — O468X1 Other antepartum hemorrhage, first trimester: Secondary | ICD-10-CM | POA: Insufficient documentation

## 2017-01-18 DIAGNOSIS — O418X10 Other specified disorders of amniotic fluid and membranes, first trimester, not applicable or unspecified: Secondary | ICD-10-CM

## 2017-01-18 DIAGNOSIS — O418X11 Other specified disorders of amniotic fluid and membranes, first trimester, fetus 1: Secondary | ICD-10-CM | POA: Insufficient documentation

## 2017-01-18 DIAGNOSIS — Z3A09 9 weeks gestation of pregnancy: Secondary | ICD-10-CM | POA: Diagnosis not present

## 2017-01-18 DIAGNOSIS — F17210 Nicotine dependence, cigarettes, uncomplicated: Secondary | ICD-10-CM | POA: Insufficient documentation

## 2017-01-18 DIAGNOSIS — R0781 Pleurodynia: Secondary | ICD-10-CM | POA: Insufficient documentation

## 2017-01-18 DIAGNOSIS — O9989 Other specified diseases and conditions complicating pregnancy, childbirth and the puerperium: Secondary | ICD-10-CM | POA: Diagnosis present

## 2017-01-18 LAB — URINALYSIS, ROUTINE W REFLEX MICROSCOPIC
BILIRUBIN URINE: NEGATIVE
Glucose, UA: NEGATIVE mg/dL
Hgb urine dipstick: NEGATIVE
Ketones, ur: NEGATIVE mg/dL
LEUKOCYTES UA: NEGATIVE
NITRITE: NEGATIVE
Protein, ur: NEGATIVE mg/dL
SPECIFIC GRAVITY, URINE: 1.013 (ref 1.005–1.030)
pH: 6 (ref 5.0–8.0)

## 2017-01-18 LAB — BASIC METABOLIC PANEL
Anion gap: 7 (ref 5–15)
BUN: 11 mg/dL (ref 6–20)
CHLORIDE: 103 mmol/L (ref 101–111)
CO2: 24 mmol/L (ref 22–32)
Calcium: 9.1 mg/dL (ref 8.9–10.3)
Creatinine, Ser: 0.59 mg/dL (ref 0.44–1.00)
GFR calc non Af Amer: >60 mL/min (ref >60)
Glucose, Bld: 89 mg/dL (ref 65–99)
POTASSIUM: 4.1 mmol/L (ref 3.5–5.1)
SODIUM: 134 mmol/L — AB (ref 135–145)

## 2017-01-18 LAB — CBC
HCT: 34.7 % — ABNORMAL LOW (ref 36.0–46.0)
HEMOGLOBIN: 11.8 g/dL — AB (ref 12.0–15.0)
MCH: 30.3 pg (ref 26.0–34.0)
MCHC: 34 g/dL (ref 30.0–36.0)
MCV: 89 fL (ref 78.0–100.0)
Platelets: 253 10*3/uL (ref 150–400)
RBC: 3.9 MIL/uL (ref 3.87–5.11)
RDW: 13.6 % (ref 11.5–15.5)
WBC: 7.2 10*3/uL (ref 4.0–10.5)

## 2017-01-18 LAB — TROPONIN I: Troponin I: <0.03 ng/mL (ref <0.03)

## 2017-01-18 MED ORDER — IOPAMIDOL (ISOVUE-370) INJECTION 76%
100.0000 mL | Freq: Once | INTRAVENOUS | Status: AC | PRN
  Administered 2017-01-18: 75 mL via INTRAVENOUS

## 2017-01-18 MED ORDER — SODIUM CHLORIDE 0.9 % IV BOLUS (SEPSIS): 1000.0000 mL | Freq: Once | INTRAVENOUS | Status: DC

## 2017-01-18 NOTE — ED Notes (Signed)
Patient transported to CT 

## 2017-01-18 NOTE — ED Triage Notes (Signed)
Pt comes in for chest pain starting 2 weeks ago. States it moves into her back and down her left arm.   In addition, she is [redacted] weeks pregnant and is aware that she has a hemorrhage in her uterus that they are monitoring. She was scheduled to get an US done today. Pt has abdominal cramping, denies any abnormal discharge or bleeding.   Pt was advised to come to hospital last week to rule out a blood clot but she states she was scared and didn't want to come.

## 2017-01-18 NOTE — ED Provider Notes (Signed)
Emergency Department Provider Note   I have reviewed the triage vital signs and the nursing notes.   HISTORY  Chief Complaint Chest Pain and Abdominal Pain   HPI Patricia Richards is a 26 y.o. female [phone] at 9 weeks by LMP presents to the ED for evaluation of worsening left sided chest pain and follow up US. Patient states that over the past 2 weeks she's had a sharp, intermittent pain in the left side of her chest radiating to the back. Denies dyspnea. No pleuritic pain. No productive cough. No fevers or chills. No injury to the chest wall. She was encouraged to present to the emergency department recently for evaluation of blood clot but states she was scared and so decided to wait it out at home.She notes worsening chest pain over the past several days, but no change in quality or character, so presented to the ED today.   Patient is also concerned aboutu ltrasound showing subchorionic hemorrhage. This was identified recently and she was referred for follow-up ultrasound. She states that she didn't get her ultrasound done today and is requesting that this be evaluated as well. She denies any vaginal bleeding. No lower abdominal pain.    Past Medical History:  Diagnosis Date  . 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 Anemia   . Bacterial infection   . Depression    on meds now, doing well  . Infection    UTI  . Kidney stones   . 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 Panic attacks   . Pregnant   . Urinary tract infection    gets them frequently  . Vaginal Pap smear, abnormal     Patient Active Problem List   Diagnosis Date Noted  . Suspected Fetal macrosomia during pregnancy in third trimester, antepartum 11/20/2015  . Pyelectasis of fetus on prenatal ultrasound 07/01/2015  . 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 Chlamydia infection affecting pregnancy in first trimester,  antepartum 05/20/2015  . Depression with anxiety 05/13/2015  . Hallucinations 05/13/2015  . Supervision of other high-risk pregnancy 05/13/2015  . History of suicidal ideation 05/13/2015  . Alcohol use affecting pregnancy, antepartum 03/18/2015  . BV (bacterial vaginosis) 11/13/2013  . Rh negative, antepartum (A neg blood type) 08/28/2013    Past Surgical History:  Procedure Laterality Date  . DILATION AND EVACUATION  11/28/2010    Current Outpatient Rx  . Order #: [id number] Class: Historical Med  . Order #: [id number] Class: Historical Med    Allergies Metronidazole and Tramadol  Family History  Problem Relation Age of Onset  . Hypertension Father   . Cancer Father        skin  . Mental illness Father   . Cancer Maternal Grandmother   . Mental illness Maternal Grandmother   . Cerebral palsy Mother   . Other Brother        problems with gums  . Heart attack Paternal Grandmother     Social History Social History  Substance Use Topics  . Smoking status: Current Some Day Smoker    Types: Cigarettes    Last attempt to quit: 02/14/2014  . Smokeless tobacco: Never Used  . Alcohol use No     Comment: occasionally     Review of Systems  Constitutional: No fever/chills Eyes: No visual changes. ENT: No sore throat. Cardiovascular: Positive chest pain. Respiratory: Denies shortness of breath. Gastrointestinal:  No abdominal pain.  No nausea, no vomiting.  No diarrhea.  No constipation. Genitourinary: Negative for dysuria.  Musculoskeletal: Negative for back pain. Skin: Negative for rash. Neurological: Negative for headaches, focal weakness or numbness.  10-point ROS otherwise negative.  ____________________________________________   PHYSICAL EXAM:  VITAL SIGNS: ED Triage Vitals [01/18/17 1545]  Enc Vitals Group     BP 113/79     Pulse Rate (!) 103     Resp 18     Temp 98.1 F (36.7 C)     Temp Source Oral     SpO2 100 %     Weight 132 lb (59.9 kg)      Height 5\' 3"  (1.6 m)     Pain Score 6   Constitutional: Alert and oriented. Well appearing and in no acute distress. Eyes: Conjunctivae are normal. Head: Atraumatic. Nose: No congestion/rhinnorhea. Mouth/Throat: Mucous membranes are moist.  Oropharynx non-erythematous. Neck: No stridor.  Cardiovascular: Normal rate, regular rhythm. Good peripheral circulation. Grossly normal heart sounds.   Respiratory: Normal respiratory effort.  No retractions. Lungs CTAB. Gastrointestinal: Soft and nontender. No distention.  Musculoskeletal: No lower extremity tenderness nor edema. No gross deformities of extremities. Neurologic:  Normal speech and language. No gross focal neurologic deficits are appreciated.  Skin:  Skin is warm, dry and intact. No rash noted.  ____________________________________________   LABS (all labs ordered are listed, but only abnormal results are displayed)  Labs Reviewed  BASIC METABOLIC PANEL - Abnormal; Notable for the following:       Result Value   Sodium 134 (*)    All other components within normal limits  CBC - Abnormal; Notable for the following:    Hemoglobin 11.8 (*)    HCT 34.7 (*)    All other components within normal limits  URINALYSIS, ROUTINE W REFLEX MICROSCOPIC - Abnormal; Notable for the following:    APPearance HAZY (*)    All other components within normal limits  TROPONIN I   ____________________________________________  EKG   EKG Interpretation  Date/Time:  Tuesday January 18 2017 15:45:25 EDT Ventricular Rate:  101 PR Interval:  150 QRS Duration: 86 QT Interval:  356 QTC Calculation: 461 R Axis:   87 Text Interpretation:  Sinus tachycardia Otherwise normal ECG No STEMI.  Confirmed by Nanda Quinton [phone]) on 01/18/2017 4:13:04 PM       ____________________________________________  RADIOLOGY  Dg Chest 2 View  Result Date: 01/18/2017 CLINICAL DATA:  Chest pain 2 weeks ago EXAM: CHEST  2 VIEW COMPARISON:  12/12/2014 FINDINGS:  The heart size and mediastinal contours are within normal limits. Both lungs are clear. The visualized skeletal structures are unremarkable. IMPRESSION: No active cardiopulmonary disease. Electronically Signed   By: Donavan Foil M.D.   On: 01/18/2017 18:51   Ct Angio Chest Pe W And/or Wo Contrast  Result Date: 01/18/2017 CLINICAL DATA:  [redacted] weeks pregnant, chest pain x2 weeks EXAM: CT ANGIOGRAPHY CHEST WITH CONTRAST TECHNIQUE: Multidetector CT imaging of the chest was performed using the standard protocol during bolus administration of intravenous contrast. Multiplanar CT image reconstructions and MIPs were obtained to evaluate the vascular anatomy. CONTRAST:  75 mL Isovue 370 COMPARISON:  Chest radiographs dated 01/18/2017 FINDINGS: Cardiovascular: Satisfactory opacification of the bilateral pulmonary artery to the segmental level. No evidence pulmonary embolism. No evidence of thoracic aortic aneurysm or dissection. The heart is normal in size.  No pericardial effusion. Mediastinum/Nodes: No suspicious mediastinal lymphadenopathy. Visualized thyroid is unremarkable. Lungs/Pleura: Lungs are clear. No suspicious pulmonary  nodules. No focal consolidation. No pleural effusion or pneumothorax. Upper Abdomen: Visualized upper abdomen is unremarkable. Musculoskeletal: Visualized osseous structures are within normal limits. Review of the MIP images confirms the above findings. IMPRESSION: No evidence of pulmonary embolism. Normal CT chest. Electronically Signed   By: Julian Hy M.D.   On: 01/18/2017 19:50   US Ob Comp Less 14 Wks  Result Date: 01/18/2017 CLINICAL DATA:  Pregnant, abdominal cramping EXAM: OBSTETRIC <14 WK [place] AND TRANSVAGINAL OB US TECHNIQUE: Both transabdominal and transvaginal ultrasound examinations were performed for complete evaluation of the gestation as well as the maternal uterus, adnexal regions, and pelvic cul-de-sac. Transvaginal technique was performed to assess early pregnancy.  COMPARISON:  None. FINDINGS: Intrauterine gestational sac: Single Yolk sac:  Visualized. Embryo:  Visualized. Cardiac Activity: Visualized. Heart Rate: 165  bpm CRL:  19.8  mm   8 w   4 d                  [place] EDC: 08/26/2017 Subchorionic hemorrhage:  Small subchorionic hemorrhage. Maternal uterus/adnexae: Bilateral ovaries are within normal limits. No free fluid. IMPRESSION: Single live intrauterine gestation, measuring 8 weeks 4 days by crown rump length, as above. Electronically Signed   By: Julian Hy M.D.   On: 01/18/2017 17:29   US Ob Transvaginal  Result Date: 01/18/2017 CLINICAL DATA:  Pregnant, abdominal cramping EXAM: OBSTETRIC <14 WK [place] AND TRANSVAGINAL OB US TECHNIQUE: Both transabdominal and transvaginal ultrasound examinations were performed for complete evaluation of the gestation as well as the maternal uterus, adnexal regions, and pelvic cul-de-sac. Transvaginal technique was performed to assess early pregnancy. COMPARISON:  None. FINDINGS: Intrauterine gestational sac: Single Yolk sac:  Visualized. Embryo:  Visualized. Cardiac Activity: Visualized. Heart Rate: 165  bpm CRL:  19.8  mm   8 w   4 d                  [place] EDC: 08/26/2017 Subchorionic hemorrhage:  Small subchorionic hemorrhage. Maternal uterus/adnexae: Bilateral ovaries are within normal limits. No free fluid. IMPRESSION: Single live intrauterine gestation, measuring 8 weeks 4 days by crown rump length, as above. Electronically Signed   By: Julian Hy M.D.   On: 01/18/2017 17:29    ____________________________________________   PROCEDURES  Procedure(s) performed:   Procedures  None ____________________________________________   INITIAL IMPRESSION / ASSESSMENT AND PLAN / ED COURSE  Pertinent labs & imaging results that were available during my care of the patient were reviewed by me and considered in my medical decision making (see chart for details).  Patient presents to the emergency department for  evaluation of worsening chest pain and reevaluation of subchorionic hemorrhage. Ultrasound ordered from triage shows mild hemorrhage with no other acute findings. Fetus is measuring [redacted]w[redacted]d by [place]. In terms of the patient's chest pain it has become more constant and non-pleuritic. She does have sinus tachycardia on arrival but no hypoxemia. Plan to start with CXR and then discuss with Radiology regarding scan choice for PE r/o in pregnancy.   07:04 PM Spoke with Radiology regarding scan selection for PE r/o. Recommend CTA PE protocol. Discussed risk/benefit with patient and she is in agreement with moving forward with CT angio.   CT angio negative for PE. Will follow with OB as scheduled. Tylenol and heat application for pain.  ____________________________________________  FINAL CLINICAL IMPRESSION(S) / ED DIAGNOSES  Final diagnoses:  Pleuritic chest pain  Subchorionic hemorrhage of placenta in first trimester, single or unspecified fetus  MEDICATIONS GIVEN DURING THIS VISIT:  Medications  iopamidol (ISOVUE-370) 76 % injection 100 mL (75 mLs Intravenous Contrast Given 01/18/17 1936)     NEW OUTPATIENT MEDICATIONS STARTED DURING THIS VISIT:  None   Note:  This document was prepared using Dragon voice recognition software and may include unintentional dictation errors.  Nanda Quinton, MD Emergency Medicine    Long, Wonda [facility], MD 01/19/17 [phone]

## 2017-01-18 NOTE — ED Notes (Signed)
Pt in US at this time 

## 2017-01-18 NOTE — Discharge Instructions (Signed)
You were seen in the ED today with chest pain. The CT scan did not show a blood clot in the lungs. Follow up with your OB/Gyn to discuss your subchorionic hemorrhage.   Return to the ED with any worsening pain, difficulty breathing, fever, chills, or severe abdominal pain.

## 2017-01-27 ENCOUNTER — Ambulatory Visit (INDEPENDENT_AMBULATORY_CARE_PROVIDER_SITE_OTHER): Payer: Medicaid Other | Admitting: Advanced Practice Midwife

## 2017-01-27 ENCOUNTER — Encounter: Payer: Self-pay | Admitting: Advanced Practice Midwife

## 2017-01-27 VITALS — BP 106/70 | HR 80

## 2017-01-27 DIAGNOSIS — Z3A10 10 weeks gestation of pregnancy: Secondary | ICD-10-CM

## 2017-01-27 DIAGNOSIS — Z3682 Encounter for antenatal screening for nuchal translucency: Secondary | ICD-10-CM | POA: Diagnosis not present

## 2017-01-27 DIAGNOSIS — Z87898 Personal history of other specified conditions: Secondary | ICD-10-CM | POA: Diagnosis not present

## 2017-01-27 DIAGNOSIS — O99321 Drug use complicating pregnancy, first trimester: Secondary | ICD-10-CM | POA: Diagnosis not present

## 2017-01-27 DIAGNOSIS — Z3490 Encounter for supervision of normal pregnancy, unspecified, unspecified trimester: Secondary | ICD-10-CM | POA: Insufficient documentation

## 2017-01-27 DIAGNOSIS — Z331 Pregnant state, incidental: Secondary | ICD-10-CM | POA: Diagnosis not present

## 2017-01-27 DIAGNOSIS — Z79899 Other long term (current) drug therapy: Secondary | ICD-10-CM

## 2017-01-27 DIAGNOSIS — O99341 Other mental disorders complicating pregnancy, first trimester: Secondary | ICD-10-CM | POA: Diagnosis not present

## 2017-01-27 DIAGNOSIS — F418 Other specified anxiety disorders: Secondary | ICD-10-CM | POA: Diagnosis not present

## 2017-01-27 DIAGNOSIS — Z1389 Encounter for screening for other disorder: Secondary | ICD-10-CM

## 2017-01-27 DIAGNOSIS — Z3481 Encounter for supervision of other normal pregnancy, first trimester: Secondary | ICD-10-CM

## 2017-01-27 DIAGNOSIS — F1991 Other psychoactive substance use, unspecified, in remission: Secondary | ICD-10-CM

## 2017-01-27 LAB — POCT URINALYSIS DIPSTICK
GLUCOSE UA: NEGATIVE
Ketones, UA: NEGATIVE
Leukocytes, UA: NEGATIVE
NITRITE UA: NEGATIVE
PROTEIN UA: NEGATIVE
RBC UA: NEGATIVE

## 2017-01-27 NOTE — Progress Notes (Signed)
Subjective:    Patricia Richards is a [id number] [redacted]w[redacted]d being seen today for her first obstetrical visit.  Her obstetrical history is significant for 3 tern SVDs. Her grandmother has custody of the last 2 kids, FOB has always had custody of 1st kid.  Going through steps to get them back.  New boyfriend is also a sober addict.   Pregnancy history fully reviewed. She had her last baby as soon as she got to the hospital, 8 13 oz.  Was going to [facility], but didn't end up going through with it.    Does not have stable housing. She and FOB staing w/friends. Has help from caseworker, thinks she will be able to move into her own apartment soon. Has to do that in order to get her kids back.   Patient reports needing klonopin refill.  soon. [place] registry shows MD in [place] has been prescribing 0.5mg  TID.  Very aware of potential risks to fetus.  States has been addicted for years and can't come off.  Denies current illicit street drug use.    Has had 3 [place] w/ this pg, subchorionc hemorrhage, smaller last [place].  Pt not bleeding.    Needed pap 9/18, thinks she got one at [facility].  Records requested.  Vitals:   01/27/17 1446  BP: 106/70  Pulse: 80    HISTORY: OB History  Gravida Para Term Preterm AB Living  5 3 3  0 1 3  SAB TAB Ectopic Multiple Live Births  0 1 0 0 3    # Outcome Date GA Lbr Len/2nd Weight Sex Delivery Anes PTL Lv  5 Current           4 Term 11/21/15 [redacted]w[redacted]d  3.997 kg (8 lb 13 oz) M Vag-Spont EPI N LIV  3 Term 12/18/13 [redacted]w[redacted]d 14:05 / 01:03 3.43 kg (7 lb 9 oz) M Vag-Spont EPI N LIV     Birth Comments: caput  2 Term 05/28/10 [redacted]w[redacted]d  3.827 kg (8 lb 7 oz) M Vag-Vacuum EPI N LIV     Birth Comments: states induced early due to PUPPS  1 TAB 2012             Past Medical History:  Diagnosis Date  . 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 Anemia   . Bacterial infection   . Bipolar 1 disorder ([place])   . Depression    on meds now, doing well  . Infection    UTI   . Kidney stones   . 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 Panic attacks   . Pregnant   . Urinary tract infection    gets them frequently  . Vaginal Pap smear, abnormal    Past Surgical History:  Procedure Laterality Date  . DILATION AND EVACUATION  11/28/2010   Family History  Problem Relation Age of Onset  . Hypertension Father   . Cancer Father        skin  . Mental illness Father   . Cancer Maternal Grandmother   . Mental illness Maternal Grandmother   . Cerebral palsy Mother   . Other Brother        problems with gums  . Heart attack Paternal Grandmother   . Down syndrome Cousin   . Bipolar disorder Cousin      Exam  System:     Skin: normal coloration and turgor, no rashes    Neurologic: oriented, normal, normal mood   Extremities: normal strength, tone, and muscle mass   HEENT PERRLA   Mouth/Teeth mucous membranes moist, normal dentition   Neck supple and no masses   Cardiovascular: regular rate and rhythm   Respiratory:  appears well, vitals normal, no respiratory distress, acyanotic   Abdomen: soft, non-tender;  FHR: 160          Assessment:    Pregnancy: [id number] Patient Active Problem List   Diagnosis Date Noted  . Supervision of normal pregnancy 01/27/2017  . History of illicit drug use 58/85/0277  . 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  . 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 Depression with anxiety 05/13/2015  . Hallucinations 05/13/2015  . History of suicidal ideation 05/13/2015  . Alcohol use affecting pregnancy, antepartum 03/18/2015        Plan:     Initial labs drawn. Continue prenatal vitamins  Problem list reviewed and updated  Reviewed n/v relief measures and warning s/s to report  Reviewed recommended weight gain based on pre-gravid BMI  Encouraged well-balanced diet Encouraged to continue w/outpt  rehab, meetings.  Genetic Screening discussed Integrated Screen: requested.  Ultrasound discussed; fetal survey: requested.  Return in about 2 weeks (around 02/10/2017) for LROB w/LHE, US:NT+1st IT.  CRESENZO-DISHMAN,Susanna Benge 01/28/2017

## 2017-01-28 LAB — URINALYSIS, ROUTINE W REFLEX MICROSCOPIC
BILIRUBIN UA: NEGATIVE
GLUCOSE, UA: NEGATIVE
KETONES UA: NEGATIVE
Leukocytes, UA: NEGATIVE
Nitrite, UA: NEGATIVE
PROTEIN UA: NEGATIVE
RBC UA: NEGATIVE
SPEC GRAV UA: 1.02 (ref 1.005–1.030)
UUROB: 0.2 mg/dL (ref 0.2–1.0)
pH, UA: 6.5 (ref 5.0–7.5)

## 2017-01-28 LAB — CBC
Hematocrit: 37.4 % (ref 34.0–46.6)
Hemoglobin: 12.6 g/dL (ref 11.1–15.9)
MCH: 29.8 pg (ref 26.6–33.0)
MCHC: 33.7 g/dL (ref 31.5–35.7)
MCV: 88 fL (ref 79–97)
PLATELETS: 291 10*3/uL (ref 150–379)
RBC: 4.23 x10E6/uL (ref 3.77–5.28)
RDW: 14.1 % (ref 12.3–15.4)
WBC: 10.8 10*3/uL (ref 3.4–10.8)

## 2017-01-28 LAB — PMP SCREEN PROFILE (10S), URINE
AMPHETAMINE SCREEN URINE: NEGATIVE ng/mL
BARBITURATE SCREEN URINE: NEGATIVE ng/mL
BENZODIAZEPINE SCREEN, URINE: NEGATIVE ng/mL
CANNABINOIDS UR QL SCN: NEGATIVE ng/mL
CREATININE(CRT), U: 102 mg/dL (ref 20.0–300.0)
Cocaine (Metab) Scrn, Ur: NEGATIVE ng/mL
Methadone Screen, Urine: NEGATIVE ng/mL
OXYCODONE+OXYMORPHONE UR QL SCN: NEGATIVE ng/mL
Opiate Scrn, Ur: NEGATIVE ng/mL
PH UR, DRUG SCRN: 6.3 (ref 4.5–8.9)
PHENCYCLIDINE QUANTITATIVE URINE: NEGATIVE ng/mL
Propoxyphene Scrn, Ur: NEGATIVE ng/mL

## 2017-01-28 LAB — RPR: RPR Ser Ql: NONREACTIVE

## 2017-01-28 LAB — ABO/RH: RH TYPE: NEGATIVE

## 2017-01-28 LAB — RUBELLA SCREEN: RUBELLA: 3.39 {index} (ref >0.99)

## 2017-01-28 LAB — ANTIBODY SCREEN: Antibody Screen: NEGATIVE

## 2017-01-28 LAB — VARICELLA ZOSTER ANTIBODY, IGG: VARICELLA: 577 {index} (ref >165)

## 2017-01-28 LAB — GC/CHLAMYDIA PROBE AMP
Chlamydia trachomatis, NAA: NEGATIVE
Neisseria gonorrhoeae by PCR: NEGATIVE

## 2017-01-28 LAB — HIV ANTIBODY (ROUTINE TESTING W REFLEX): HIV SCREEN 4TH GENERATION: NONREACTIVE

## 2017-01-28 LAB — HEPATITIS B SURFACE ANTIGEN: Hepatitis B Surface Ag: NEGATIVE

## 2017-01-29 LAB — URINE CULTURE

## 2017-02-01 ENCOUNTER — Telehealth: Payer: Self-pay | Admitting: *Deleted

## 2017-02-01 NOTE — Telephone Encounter (Signed)
Patient called with complaints of constipation to the point that it makes her not want to eat. She stated "what am I supposed to do if I can't eat?" Advised patient to make sure she was drinking lots of water, to try adding more fiber to her diet such as green leafy veggies or FiberOne bars, along with taking a stool softner daily or Miralax. Also encouraged to try eating small frequent meals instead of large meals to see if that would help as well. Verbalized understanding.

## 2017-02-08 ENCOUNTER — Ambulatory Visit (INDEPENDENT_AMBULATORY_CARE_PROVIDER_SITE_OTHER): Payer: Medicaid Other

## 2017-02-08 ENCOUNTER — Encounter: Payer: Self-pay | Admitting: Obstetrics & Gynecology

## 2017-02-08 ENCOUNTER — Ambulatory Visit (INDEPENDENT_AMBULATORY_CARE_PROVIDER_SITE_OTHER): Payer: Medicaid Other | Admitting: Obstetrics & Gynecology

## 2017-02-08 VITALS — BP 110/70 | HR 80 | Temp 98.3°F | Wt 126.4 lb

## 2017-02-08 DIAGNOSIS — Z331 Pregnant state, incidental: Secondary | ICD-10-CM

## 2017-02-08 DIAGNOSIS — Z79899 Other long term (current) drug therapy: Secondary | ICD-10-CM

## 2017-02-08 DIAGNOSIS — Z3401 Encounter for supervision of normal first pregnancy, first trimester: Secondary | ICD-10-CM

## 2017-02-08 DIAGNOSIS — Z3682 Encounter for antenatal screening for nuchal translucency: Secondary | ICD-10-CM

## 2017-02-08 DIAGNOSIS — Z1379 Encounter for other screening for genetic and chromosomal anomalies: Secondary | ICD-10-CM

## 2017-02-08 DIAGNOSIS — Z87898 Personal history of other specified conditions: Secondary | ICD-10-CM

## 2017-02-08 DIAGNOSIS — O99341 Other mental disorders complicating pregnancy, first trimester: Secondary | ICD-10-CM | POA: Diagnosis not present

## 2017-02-08 DIAGNOSIS — F418 Other specified anxiety disorders: Secondary | ICD-10-CM | POA: Diagnosis not present

## 2017-02-08 DIAGNOSIS — Z3A11 11 weeks gestation of pregnancy: Secondary | ICD-10-CM | POA: Diagnosis not present

## 2017-02-08 DIAGNOSIS — Z3481 Encounter for supervision of other normal pregnancy, first trimester: Secondary | ICD-10-CM

## 2017-02-08 DIAGNOSIS — Z1389 Encounter for screening for other disorder: Secondary | ICD-10-CM | POA: Diagnosis not present

## 2017-02-08 DIAGNOSIS — F1991 Other psychoactive substance use, unspecified, in remission: Secondary | ICD-10-CM

## 2017-02-08 LAB — POCT URINALYSIS DIPSTICK
Blood, UA: NEGATIVE
Glucose, UA: NEGATIVE
Ketones, UA: NEGATIVE
LEUKOCYTES UA: NEGATIVE
NITRITE UA: NEGATIVE
PROTEIN UA: NEGATIVE

## 2017-02-08 MED ORDER — CLONAZEPAM 0.5 MG PO TABS: 0.5000 mg | ORAL_TABLET | Freq: Three times a day (TID) | ORAL | 1 refills | Status: DC

## 2017-02-08 NOTE — Progress Notes (Signed)
[id number] [redacted]w[redacted]d Estimated Date of Delivery: 08/24/17  Blood pressure 110/70, pulse 80, temperature 98.3 F (36.8 C), weight 126 lb 6.4 oz (57.3 kg), last menstrual period 11/17/2016, not currently breastfeeding.   BP weight and urine results all reviewed and noted.  Please refer to the obstetrical flow sheet for the fundal height and fetal heart rate documentation:  Patient reports good fetal movement, denies any bleeding and no rupture of membranes symptoms or regular contractions. Patient is without complaints. All questions were answered.  Orders Placed This Encounter  Procedures  . Integrated 1  . POCT urinalysis dipstick   Meds ordered this encounter  Medications  . clonazePAM (KLONOPIN) 0.5 MG tablet    Sig: Take 1 tablet (0.5 mg total) by mouth 3 (three) times daily.    Dispense:  90 tablet    Refill:  1     Plan:  Continued routine obstetrical care, NT sonogram is normal  Return in about 4 weeks (around 03/08/2017) for 2nd IT, LROB.

## 2017-02-08 NOTE — Progress Notes (Signed)
[place] 11+6 wks,measurements c/w dates,normal ovaries bilat,crl 51.87 mm,fhr 165 bpm,NB present,subchorionic hemorrhage 2.7 x 2.3 x 1.6 cm

## 2017-02-14 LAB — INTEGRATED 1
CROWN RUMP LENGTH MAT SCREEN: 51.9 mm
Gest. Age on Collection Date: 11.9 weeks
Maternal Age at EDD: 24.4 yr
NUCHAL TRANSLUCENCY (NT): 0.9 mm
Number of Fetuses: 1
PAPP-A Value: 426.4 ng/mL
Weight: 126 [lb_av]

## 2017-03-03 ENCOUNTER — Ambulatory Visit (INDEPENDENT_AMBULATORY_CARE_PROVIDER_SITE_OTHER): Payer: Medicaid Other | Admitting: Women's Health

## 2017-03-03 ENCOUNTER — Encounter: Payer: Self-pay | Admitting: Women's Health

## 2017-03-03 ENCOUNTER — Other Ambulatory Visit (HOSPITAL_COMMUNITY)
Admission: RE | Admit: 2017-03-03 | Discharge: 2017-03-03 | Disposition: A | Discharge status: Home / Self Care | Payer: Medicaid Other | Source: Ambulatory Visit | Attending: Obstetrics & Gynecology | Admitting: Obstetrics & Gynecology

## 2017-03-03 VITALS — BP 110/58 | HR 94 | Wt 132.0 lb

## 2017-03-03 DIAGNOSIS — O26892 Other specified pregnancy related conditions, second trimester: Secondary | ICD-10-CM

## 2017-03-03 DIAGNOSIS — Z3482 Encounter for supervision of other normal pregnancy, second trimester: Secondary | ICD-10-CM | POA: Insufficient documentation

## 2017-03-03 DIAGNOSIS — Z1379 Encounter for other screening for genetic and chromosomal anomalies: Secondary | ICD-10-CM | POA: Diagnosis not present

## 2017-03-03 DIAGNOSIS — Z363 Encounter for antenatal screening for malformations: Secondary | ICD-10-CM

## 2017-03-03 DIAGNOSIS — Z331 Pregnant state, incidental: Secondary | ICD-10-CM | POA: Diagnosis not present

## 2017-03-03 DIAGNOSIS — Z1389 Encounter for screening for other disorder: Secondary | ICD-10-CM | POA: Diagnosis not present

## 2017-03-03 DIAGNOSIS — N898 Other specified noninflammatory disorders of vagina: Secondary | ICD-10-CM

## 2017-03-03 DIAGNOSIS — Z3A15 15 weeks gestation of pregnancy: Secondary | ICD-10-CM

## 2017-03-03 DIAGNOSIS — F1921 Other psychoactive substance dependence, in remission: Secondary | ICD-10-CM | POA: Diagnosis not present

## 2017-03-03 DIAGNOSIS — Z87898 Personal history of other specified conditions: Secondary | ICD-10-CM

## 2017-03-03 DIAGNOSIS — F1991 Other psychoactive substance use, unspecified, in remission: Secondary | ICD-10-CM

## 2017-03-03 LAB — POCT URINALYSIS DIPSTICK
Blood, UA: NEGATIVE
GLUCOSE UA: NEGATIVE
KETONES UA: NEGATIVE
LEUKOCYTES UA: NEGATIVE
Nitrite, UA: NEGATIVE
Protein, UA: NEGATIVE

## 2017-03-03 LAB — POCT WET PREP (WET MOUNT)
CLUE CELLS WET PREP WHIFF POC: POSITIVE
Trichomonas Wet Prep HPF POC: ABSENT

## 2017-03-03 MED ORDER — METRONIDAZOLE 500 MG PO TABS: 500.0000 mg | ORAL_TABLET | Freq: Two times a day (BID) | ORAL | 0 refills | Status: DC

## 2017-03-03 MED ORDER — PANTOPRAZOLE SODIUM 20 MG PO TBEC: 20.0000 mg | DELAYED_RELEASE_TABLET | Freq: Every day | ORAL | 6 refills | Status: DC

## 2017-03-03 NOTE — Patient Instructions (Addendum)
For Dizzy Spells:   This is usually related to either your blood sugar or your blood pressure dropping  Make sure you are staying well hydrated and drinking enough water so that your urine is clear  Eat small frequent meals and snacks containing protein (meat, eggs, nuts, cheese) so that your blood sugar doesn't drop  If you do get dizzy, sit/lay down and get you something to drink and a snack containing protein- you will usually start feeling better in 10-20 minutes    For Headaches:   Stay well hydrated, drink enough water so that your urine is clear, sometimes if you are dehydrated you can get headaches  Eat small frequent meals and snacks, sometimes if you are hungry you can get headaches  Sometimes you get headaches during pregnancy from the pregnancy hormones  You can try tylenol (1-2 regular strength 325mg  or 1-2 extra strength 500mg ) as directed on the box. The least amount of medication that works is best.   Cool compresses (cool wet washcloth or ice pack) to area of head that is hurting  You can also try drinking a caffeinated drink to see if this will help  If not helping, try below:  For Prevention of Headaches/Migraines:  CoQ10 100mg  three times daily  Vitamin B2 400mg  daily  Magnesium Oxide 400-600mg  daily  If You Get a Bad Headache/Migraine:  Benadryl 25mg    Magnesium Oxide  1 large Gatorade  2 extra strength Tylenol (1,000mg  total)  1 cup coffee or Coke  If this doesn't help please call us @ [phone]    Second Trimester of Pregnancy The second trimester is from week 14 through week 27 (months 4 through 6). The second trimester is often a time when you feel your best. Your body has adjusted to being pregnant, and you begin to feel better physically. Usually, morning sickness has lessened or quit completely, you may have more energy, and you may have an increase in appetite. The second trimester is also a time when the fetus is growing rapidly. At  the end of the sixth month, the fetus is about 9 inches long and weighs about 1 pounds. You will likely begin to feel the baby move (quickening) between 16 and 20 weeks of pregnancy. Body changes during your second trimester Your body continues to go through many changes during your second trimester. The changes vary from woman to woman.  Your weight will continue to increase. You will notice your lower abdomen bulging out.  You may begin to get stretch marks on your hips, abdomen, and breasts.  You may develop headaches that can be relieved by medicines. The medicines should be approved by your health care provider.  You may urinate more often because the fetus is pressing on your bladder.  You may develop or continue to have heartburn as a result of your pregnancy.  You may develop constipation because certain hormones are causing the muscles that push waste through your intestines to slow down.  You may develop hemorrhoids or swollen, bulging veins (varicose veins).  You may have back pain. This is caused by: ? Weight gain. ? Pregnancy hormones that are relaxing the joints in your pelvis. ? A shift in weight and the muscles that support your balance.  Your breasts will continue to grow and they will continue to become tender.  Your gums may bleed and may be sensitive to brushing and flossing.  Dark spots or blotches (chloasma, mask of pregnancy) may develop on your face. This  will likely fade after the baby is born.  A dark line from your belly button to the pubic area (linea nigra) may appear. This will likely fade after the baby is born.  You may have changes in your hair. These can include thickening of your hair, rapid growth, and changes in texture. Some women also have hair loss during or after pregnancy, or hair that feels dry or thin. Your hair will most likely return to normal after your baby is born.  What to expect at prenatal visits During a routine prenatal  visit:  You will be weighed to make sure you and the fetus are growing normally.  Your blood pressure will be taken.  Your abdomen will be measured to track your baby's growth.  The fetal heartbeat will be listened to.  Any test results from the previous visit will be discussed.  Your health care provider may ask you:  How you are feeling.  If you are feeling the baby move.  If you have had any abnormal symptoms, such as leaking fluid, bleeding, severe headaches, or abdominal cramping.  If you are using any tobacco products, including cigarettes, chewing tobacco, and electronic cigarettes.  If you have any questions.  Other tests that may be performed during your second trimester include:  Blood tests that check for: ? Low iron levels (anemia). ? High blood sugar that affects pregnant women (gestational diabetes) between 60 and 28 weeks. ? Rh antibodies. This is to check for a protein on red blood cells (Rh factor).  Urine tests to check for infections, diabetes, or protein in the urine.  An ultrasound to confirm the proper growth and development of the baby.  An amniocentesis to check for possible genetic problems.  Fetal screens for spina bifida and Down syndrome.  HIV (human immunodeficiency virus) testing. Routine prenatal testing includes screening for HIV, unless you choose not to have this test.  Follow these instructions at home: Medicines  Follow your health care provider's instructions regarding medicine use. Specific medicines may be either safe or unsafe to take during pregnancy.  Take a prenatal vitamin that contains at least 600 micrograms (mcg) of folic acid.  If you develop constipation, try taking a stool softener if your health care provider approves. Eating and drinking  Eat a balanced diet that includes fresh fruits and vegetables, whole grains, good sources of protein such as meat, eggs, or tofu, and low-fat dairy. Your health care provider will  help you determine the amount of weight gain that is right for you.  Avoid raw meat and uncooked cheese. These carry germs that can cause birth defects in the baby.  If you have low calcium intake from food, talk to your health care provider about whether you should take a daily calcium supplement.  Limit foods that are high in fat and processed sugars, such as fried and sweet foods.  To prevent constipation: ? Drink enough fluid to keep your urine clear or pale yellow. ? Eat foods that are high in fiber, such as fresh fruits and vegetables, whole grains, and beans. Activity  Exercise only as directed by your health care provider. Most women can continue their usual exercise routine during pregnancy. Try to exercise for 30 minutes at least 5 days a week. Stop exercising if you experience uterine contractions.  Avoid heavy lifting, wear low heel shoes, and practice good posture.  A sexual relationship may be continued unless your health care provider directs you otherwise. Relieving pain and discomfort  Wear a good support bra to prevent discomfort from breast tenderness.  Take warm sitz baths to soothe any pain or discomfort caused by hemorrhoids. Use hemorrhoid cream if your health care provider approves.  Rest with your legs elevated if you have leg cramps or low back pain.  If you develop varicose veins, wear support hose. Elevate your feet for 15 minutes, 3-4 times a day. Limit salt in your diet. Prenatal Care  Write down your questions. Take them to your prenatal visits.  Keep all your prenatal visits as told by your health care provider. This is important. Safety  Wear your seat belt at all times when driving.  Make a list of emergency phone numbers, including numbers for family, friends, the hospital, and police and fire departments. General instructions  Ask your health care provider for a referral to a local prenatal education class. Begin classes no later than the  beginning of month 6 of your pregnancy.  Ask for help if you have counseling or nutritional needs during pregnancy. Your health care provider can offer advice or refer you to specialists for help with various needs.  Do not use hot tubs, steam rooms, or saunas.  Do not douche or use tampons or scented sanitary pads.  Do not cross your legs for long periods of time.  Avoid cat litter boxes and soil used by cats. These carry germs that can cause birth defects in the baby and possibly loss of the fetus by miscarriage or stillbirth.  Avoid all smoking, herbs, alcohol, and unprescribed drugs. Chemicals in these products can affect the formation and growth of the baby.  Do not use any products that contain nicotine or tobacco, such as cigarettes and e-cigarettes. If you need help quitting, ask your health care provider.  Visit your dentist if you have not gone yet during your pregnancy. Use a soft toothbrush to brush your teeth and be gentle when you floss. Contact a health care provider if:  You have dizziness.  You have mild pelvic cramps, pelvic pressure, or nagging pain in the abdominal area.  You have persistent nausea, vomiting, or diarrhea.  You have a bad smelling vaginal discharge.  You have pain when you urinate. Get help right away if:  You have a fever.  You are leaking fluid from your vagina.  You have spotting or bleeding from your vagina.  You have severe abdominal cramping or pain.  You have rapid weight gain or weight loss.  You have shortness of breath with chest pain.  You notice sudden or extreme swelling of your face, hands, ankles, feet, or legs.  You have not felt your baby move in over an hour.  You have severe headaches that do not go away when you take medicine.  You have vision changes. Summary  The second trimester is from week 14 through week 27 (months 4 through 6). It is also a time when the fetus is growing rapidly.  Your body goes  through many changes during pregnancy. The changes vary from woman to woman.  Avoid all smoking, herbs, alcohol, and unprescribed drugs. These chemicals affect the formation and growth your baby.  Do not use any tobacco products, such as cigarettes, chewing tobacco, and e-cigarettes. If you need help quitting, ask your health care provider.  Contact your health care provider if you have any questions. Keep all prenatal visits as told by your health care provider. This is important. This information is not intended to replace advice given to  you by your health care provider. Make sure you discuss any questions you have with your health care provider. Document Released: 04/27/2001 Document Revised: 10/09/2015 Document Reviewed: 07/04/2012 Elsevier Interactive Patient Education  2017 [employer].

## 2017-03-03 NOTE — Progress Notes (Signed)
Work-in LOW-RISK PREGNANCY VISIT Patient name: Patricia Richards MRN [id number]  Date of birth: Apr 06, 1991 Chief Complaint:   Routine Prenatal Visit  History of Present Illness:   Patricia Richards is a 26 y.o. [id number] female at [redacted]w[redacted]d with an Estimated Date of Delivery: 08/24/17 being seen today for ongoing management of a low-risk pregnancy.  Today she reports reflux, headaches, constipation, felt like she was going to pass out on toilet the other day. No ETOH or illicit drugs in 4-1LKGM. Also reports discharge with itching x few days. Has had BV before. Has allergy listed to metronidazole- SOB, states this was her anxiety, not really the medicine.  Contractions: Not present. Vag. Bleeding: None.  Movement: Absent. denies leaking of fluid. Review of Systems:   Pertinent items are noted in HPI Denies visual changes, shortness of breath, chest pain, abdominal pain, severe nausea/vomiting, or problems with urination or bowel movements unless otherwise stated above. Pertinent History Reviewed:  Reviewed past medical,surgical, social, obstetrical and family history.  Reviewed problem list, medications and allergies. Physical Assessment:   Vitals:   03/03/17 1159  BP: (!) 110/58  Pulse: 94  Weight: 132 lb (59.9 kg)  Body mass index is 23.38 kg/m.        Physical Examination:   General appearance: Well appearing, and in no distress  Mental status: Alert, oriented to person, place, and time  Skin: Warm & dry  Cardiovascular: Normal heart rate noted  Respiratory: Normal respiratory effort, no distress  Abdomen: Soft, gravid, nontender  Pelvic: thin prep pap obtained, small amt thin white malodorous d/c   Extremities: Edema: None  Fetal Status: Fetal Heart Rate (bpm): 155   Movement: Absent    Results for orders placed or performed in visit on 03/03/17 (from the past 24 hour(s))  POCT Urinalysis Dipstick   Collection Time: 03/03/17 12:03 PM  Result Value Ref Range   Color, UA     Clarity, UA     Glucose, UA neg    Bilirubin, UA     Ketones, UA neg    Spec Grav, UA  1.010 - 1.025   Blood, UA neg    pH, UA  5.0 - 8.0   Protein, UA neg    Urobilinogen, UA  0.2 or 1.0 E.U./dL   Nitrite, UA neg    Leukocytes, UA Negative Negative  POCT Wet Prep [facility] [place])   Collection Time: 03/03/17  1:26 PM  Result Value Ref Range   Source Wet Prep POC vaginal    WBC, Wet Prep HPF POC few    Bacteria Wet Prep HPF POC None (A) Few   BACTERIA WET PREP MORPHOLOGY POC     Clue Cells Wet Prep HPF POC Moderate (A) None   Clue Cells Wet Prep Whiff POC Positive Whiff    Yeast Wet Prep HPF POC None    KOH Wet Prep POC     Trichomonas Wet Prep HPF POC Absent Absent    Assessment & Plan:  1) Low-risk pregnancy [id number] at [redacted]w[redacted]d with an Estimated Date of Delivery: 08/24/17   2) BV, Rx metronidazole 500mg  BID x 7d for BV, no sex while taking. Has listed allergy to metronidazole, SOB, states this was her anxiety, not the medicine. She wants to try medicine again. If notices SOB, to stop taking immediately   3) Headaches> gave printed prevention/relief measures  4) Constipation> gave printed prevention/relief measures  5) Reflux> rx protonix 20mg  daily w/ 6RF  6) Dizzy spells> gave printed  prevention/relief measures    Labs/procedures today: uds, 2nd IT, declines flu shot-maybe next visit  Plan:  Continue routine obstetrical care   Reviewed: Preterm labor symptoms and general obstetric precautions including but not limited to vaginal bleeding, contractions, leaking of fluid and fetal movement were reviewed in detail with the patient.  All questions were answered  Follow-up: Return for cancel upcoming appt; then 3wks LROB/anatomy u/s.  Orders Placed This Encounter  Procedures  . US OB Comp + 14 Wk  . Pain Management Screening Profile (10S)  . INTEGRATED 2  . POCT Urinalysis Dipstick  . POCT [employer] Prep ([id number])   Tawnya Crook CNM, [facility] 03/03/2017 1:27 PM

## 2017-03-04 LAB — PMP SCREEN PROFILE (10S), URINE
AMPHETAMINE SCREEN URINE: NEGATIVE ng/mL
BARBITURATE SCREEN URINE: NEGATIVE ng/mL
BENZODIAZEPINE SCREEN, URINE: NEGATIVE ng/mL
CANNABINOIDS UR QL SCN: NEGATIVE ng/mL
COCAINE(METAB.)SCREEN, URINE: NEGATIVE ng/mL
CREATININE(CRT), U: 53.6 mg/dL (ref 20.0–300.0)
METHADONE SCREEN, URINE: NEGATIVE ng/mL
OXYCODONE+OXYMORPHONE UR QL SCN: NEGATIVE ng/mL
Opiate Scrn, Ur: NEGATIVE ng/mL
PHENCYCLIDINE QUANTITATIVE URINE: NEGATIVE ng/mL
PROPOXYPHENE SCREEN URINE: NEGATIVE ng/mL
Ph of Urine: 8 (ref 4.5–8.9)

## 2017-03-08 ENCOUNTER — Encounter: Payer: Medicaid Other | Admitting: Obstetrics & Gynecology

## 2017-03-08 LAB — CYTOLOGY - PAP: Diagnosis: NEGATIVE

## 2017-03-11 LAB — INTEGRATED 2
ADSF: 0.6
AFP MARKER: 25.2 ng/mL
AFP MOM: 0.84
CROWN RUMP LENGTH: 51.9 mm
DIA MoM: 1.18
DIA Value: 229.2 pg/mL
ESTRIOL UNCONJUGATED: 0.39 ng/mL
GEST. AGE ON COLLECTION DATE: 11.9 wk
GESTATIONAL AGE: 15.1 wk
HCG VALUE: 35.8 [IU]/mL
MATERNAL AGE AT EDD: 24.4 a
Nuchal Translucency (NT): 0.9 mm
Nuchal Translucency MoM: 0.65
Number of Fetuses: 1
PAPP-A MoM: 0.48
PAPP-A VALUE: 426.4 ng/mL
TEST RESULTS: NEGATIVE
WEIGHT: 126 [lb_av]
Weight: 132 [lb_av]
hCG MoM: 0.77

## 2017-03-21 ENCOUNTER — Ambulatory Visit (INDEPENDENT_AMBULATORY_CARE_PROVIDER_SITE_OTHER): Payer: Medicaid Other | Admitting: Obstetrics & Gynecology

## 2017-03-21 ENCOUNTER — Encounter: Payer: Self-pay | Admitting: Obstetrics & Gynecology

## 2017-03-21 ENCOUNTER — Ambulatory Visit (INDEPENDENT_AMBULATORY_CARE_PROVIDER_SITE_OTHER): Payer: Medicaid Other

## 2017-03-21 VITALS — BP 100/60 | HR 94 | Wt 131.0 lb

## 2017-03-21 DIAGNOSIS — Z363 Encounter for antenatal screening for malformations: Secondary | ICD-10-CM

## 2017-03-21 DIAGNOSIS — Z1389 Encounter for screening for other disorder: Secondary | ICD-10-CM

## 2017-03-21 DIAGNOSIS — Z79899 Other long term (current) drug therapy: Secondary | ICD-10-CM

## 2017-03-21 DIAGNOSIS — Z3402 Encounter for supervision of normal first pregnancy, second trimester: Secondary | ICD-10-CM

## 2017-03-21 DIAGNOSIS — Z3A17 17 weeks gestation of pregnancy: Secondary | ICD-10-CM

## 2017-03-21 DIAGNOSIS — Z23 Encounter for immunization: Secondary | ICD-10-CM

## 2017-03-21 DIAGNOSIS — Z331 Pregnant state, incidental: Secondary | ICD-10-CM | POA: Diagnosis not present

## 2017-03-21 DIAGNOSIS — Z87898 Personal history of other specified conditions: Secondary | ICD-10-CM

## 2017-03-21 DIAGNOSIS — Z3482 Encounter for supervision of other normal pregnancy, second trimester: Secondary | ICD-10-CM | POA: Diagnosis not present

## 2017-03-21 DIAGNOSIS — F1991 Other psychoactive substance use, unspecified, in remission: Secondary | ICD-10-CM

## 2017-03-21 LAB — POCT URINALYSIS DIPSTICK
GLUCOSE UA: NEGATIVE
Ketones, UA: NEGATIVE
LEUKOCYTES UA: NEGATIVE
NITRITE UA: NEGATIVE
Protein, UA: NEGATIVE
RBC UA: NEGATIVE

## 2017-03-21 NOTE — Progress Notes (Signed)
[id number] [redacted]w[redacted]d Estimated Date of Delivery: 08/24/17  Blood pressure 100/60, pulse 94, weight 131 lb (59.4 kg), last menstrual period 11/17/2016, not currently breastfeeding.   BP weight and urine results all reviewed and noted.  Please refer to the obstetrical flow sheet for the fundal height and fetal heart rate documentation:  Patient reports good fetal movement, denies any bleeding and no rupture of membranes symptoms or regular contractions. Patient is without complaints. All questions were answered.  Orders Placed This Encounter  Procedures  . POCT urinalysis dipstick    Plan:  Continued routine obstetrical care, sonogram is normal today, see report  Return in about 4 weeks (around 04/18/2017) for [place].

## 2017-03-21 NOTE — Progress Notes (Signed)
[place] 02+4 wks,cephalic,fundal pl gr 0,normal ovaries bilat,svp of fluid 6 cm,fhr 160 bpm,cx 4 cm,efw 210 g, anatomy complete,no obvious abnormalities

## 2017-03-24 ENCOUNTER — Encounter: Payer: Medicaid Other | Admitting: Women's Health

## 2017-04-11 ENCOUNTER — Telehealth: Payer: Self-pay | Admitting: *Deleted

## 2017-04-11 NOTE — Telephone Encounter (Signed)
Patient states she has had diarrhea since 2am this morning along with lower abdominal cramping but no bleeding. She is having some nausea associated with it. She doesn't know if it was something she ate or not. States she has not eaten anything since 9pm last night and when asked why she said she only had juice to drink and no food in her house. States she can't drink the water from her faucet because of burst pipes.When the patient answered the phone, her husband stated "they are here to turn off the lights". Patient asked if she needed to talk with a case worker about getting some assistance but patient declined stating " I don't want to talk with another case worker, I have enough on my back now."  States she doesn't have any money on her EBT card and has gotten help from several churches because she has been through this before. Encouraged patient to at least try to take sips of fluid to avoid dehydration or juice since she stated she did have some. [facility] if cramping became worse or she started bleeding. Appointment changed to this week. Pt verbalized understanding.

## 2017-04-14 ENCOUNTER — Encounter: Payer: Medicaid Other | Admitting: Advanced Practice Midwife

## 2017-04-18 ENCOUNTER — Encounter: Payer: Medicaid Other | Admitting: Women's Health

## 2017-04-20 ENCOUNTER — Other Ambulatory Visit: Payer: Self-pay

## 2017-04-20 ENCOUNTER — Emergency Department (HOSPITAL_COMMUNITY)
Admission: EM | Admit: 2017-04-20 | Discharge: 2017-04-20 | Disposition: A | Discharge status: Home / Self Care | Payer: Medicaid Other | Attending: Emergency Medicine | Admitting: Emergency Medicine

## 2017-04-20 ENCOUNTER — Emergency Department (HOSPITAL_COMMUNITY): Payer: Medicaid Other

## 2017-04-20 ENCOUNTER — Encounter (HOSPITAL_COMMUNITY): Payer: Self-pay | Admitting: Emergency Medicine

## 2017-04-20 DIAGNOSIS — Z87891 Personal history of nicotine dependence: Secondary | ICD-10-CM | POA: Insufficient documentation

## 2017-04-20 DIAGNOSIS — Z79899 Other long term (current) drug therapy: Secondary | ICD-10-CM | POA: Insufficient documentation

## 2017-04-20 DIAGNOSIS — R002 Palpitations: Secondary | ICD-10-CM | POA: Diagnosis present

## 2017-04-20 DIAGNOSIS — R079 Chest pain, unspecified: Secondary | ICD-10-CM | POA: Insufficient documentation

## 2017-04-20 LAB — BASIC METABOLIC PANEL
ANION GAP: 8 (ref 5–15)
BUN: 9 mg/dL (ref 6–20)
CO2: 23 mmol/L (ref 22–32)
Calcium: 9.2 mg/dL (ref 8.9–10.3)
Chloride: 104 mmol/L (ref 101–111)
Creatinine, Ser: 0.44 mg/dL (ref 0.44–1.00)
GFR calc Af Amer: >60 mL/min (ref >60)
GLUCOSE: 82 mg/dL (ref 65–99)
POTASSIUM: 3.2 mmol/L — AB (ref 3.5–5.1)
Sodium: 135 mmol/L (ref 135–145)

## 2017-04-20 LAB — CBC
HEMATOCRIT: 33.9 % — AB (ref 36.0–46.0)
Hemoglobin: 11 g/dL — ABNORMAL LOW (ref 12.0–15.0)
MCH: 30.4 pg (ref 26.0–34.0)
MCHC: 32.4 g/dL (ref 30.0–36.0)
MCV: 93.6 fL (ref 78.0–100.0)
Platelets: 241 10*3/uL (ref 150–400)
RBC: 3.62 MIL/uL — AB (ref 3.87–5.11)
RDW: 13.5 % (ref 11.5–15.5)
WBC: 9.2 10*3/uL (ref 4.0–10.5)

## 2017-04-20 LAB — I-STAT TROPONIN, ED: Troponin i, poc: 0 ng/mL (ref 0.00–0.08)

## 2017-04-20 NOTE — ED Provider Notes (Signed)
[facility] EMERGENCY DEPARTMENT Provider Note   CSN: [id number] Arrival date & time: 04/20/17  1533     History   Chief Complaint Chief Complaint  Patient presents with  . Palpitations    HPI Patricia Richards is a 26 y.o. female.   Palpitations     Patient presents to the emergency room for evaluation of chest pain.  She started having sharp pain in her chest today that radiates to her neck and shoulder.  She is also had palpitations for the past couple of days.  She feels like her heart racing.  Patient states she has a history of anxiety and has had trouble with chest pain in the past.  She denies any difficulties with heart disease, DVT or pulmonary embolism.  No fevers or chills.  No coughing.  No abdominal pain.  No bleeding.  She is pregnant.  [id number] [facility] 08/24/17 Past Medical History:  Diagnosis Date  . 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 Anemia   . Bacterial infection   . Bipolar 1 disorder ([place])   . Depression    on meds now, doing well  . Herpes   . Infection    UTI  . Kidney stones   . 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 Panic attacks   . Pregnant   . Urinary tract infection    gets them frequently  . Vaginal Pap smear, abnormal     Patient Active Problem List   Diagnosis Date Noted  . Supervision of normal pregnancy 01/27/2017  . History of illicit drug use 40/02/2724  . 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  . Abnormal Pap smear of cervix 05/20/2015  . Depression with anxiety 05/13/2015  . Hallucinations 05/13/2015  . History of suicidal ideation 05/13/2015  . History of alcohol abuse 03/18/2015    Past Surgical History:  Procedure Laterality Date  . DILATION AND EVACUATION  11/28/2010    OB History    Gravida Para Term Preterm AB Living   5 3 3  0 1 3   SAB TAB Ectopic Multiple Live Births   0 1 0 0 3       Home Medications    Prior to  Admission medications   Medication Sig Start Date End Date Taking? Authorizing Provider  acetaminophen (TYLENOL) 500 MG tablet Take 500 mg by mouth every 6 (six) hours as needed for mild pain or moderate pain.   Yes [provider]  clonazePAM (KLONOPIN) 0.5 MG tablet Take 1 tablet (0.5 mg total) by mouth 3 (three) times daily. 02/08/17  Yes Florian Buff, MD  pantoprazole (PROTONIX) 20 MG tablet Take 1 tablet (20 mg total) by mouth daily. 03/03/17  Yes Roma Schanz, CNM    Family History Family History  Problem Relation Age of Onset  . Hypertension Father   . Cancer Father        skin  . Mental illness Father   . Cancer Maternal Grandmother   . Mental illness Maternal Grandmother   . Cerebral palsy Mother   . Other Brother        problems with gums  . Heart attack Paternal Grandmother   . Down syndrome Cousin   . Bipolar disorder Cousin     Social History Social History   Tobacco Use  . Smoking status: Former Smoker    Types: Cigarettes    Last attempt to  quit: 02/14/2014    Years since quitting: 3.1  . Smokeless tobacco: Never Used  Substance Use Topics  . Alcohol use: No    Alcohol/week: 0.0 oz    Comment: occasionally   . Drug use: No    Comment: none since march 2018     Allergies   Metronidazole and Tramadol   Review of Systems Review of Systems  Cardiovascular: Positive for palpitations.     Physical Exam Updated Vital Signs BP (!) 101/57   Pulse 84   Temp (!) 97.5 F (36.4 C)   Resp 20   Ht 1.626 m (5\' 4" )   Wt 61.2 kg (135 lb)   LMP 11/17/2016   SpO2 100%   BMI 23.17 kg/m   Physical Exam  Constitutional: She appears well-developed and well-nourished. No distress.  HENT:  Head: Normocephalic and atraumatic.  Right Ear: External ear normal.  Left Ear: External ear normal.  Eyes: Conjunctivae are normal. Right eye exhibits no discharge. Left eye exhibits no discharge. No scleral icterus.  Neck: Neck supple. No tracheal  deviation present.  Cardiovascular: Normal rate, regular rhythm and intact distal pulses.  Pulmonary/Chest: Effort normal and breath sounds normal. No stridor. No respiratory distress. She has no wheezes. She has no rales.  Abdominal: Soft. Bowel sounds are normal. She exhibits no distension. There is no tenderness. There is no rebound and no guarding.  gravid uterus  Musculoskeletal: She exhibits no edema or tenderness.  Neurological: She is alert. She has normal strength. No cranial nerve deficit (no facial droop, extraocular movements intact, no slurred speech) or sensory deficit. She exhibits normal muscle tone. She displays no seizure activity. Coordination normal.  Skin: Skin is warm and dry. No rash noted.  Psychiatric: She has a normal mood and affect.  Nursing note and vitals reviewed.    ED Treatments / Results  Labs (all labs ordered are listed, but only abnormal results are displayed) Labs Reviewed  CBC - Abnormal; Notable for the following components:      Result Value   RBC 3.62 (*)    Hemoglobin 11.0 (*)    HCT 33.9 (*)    All other components within normal limits  BASIC METABOLIC PANEL - Abnormal; Notable for the following components:   Potassium 3.2 (*)    All other components within normal limits  I-STAT TROPONIN, ED    EKG  EKG Interpretation  Date/Time:  Wednesday April 20 2017 15:47:33 EST Ventricular Rate:  78 PR Interval:    QRS Duration: 75 QT Interval:  365 QTC Calculation: 416 R Axis:   86 Text Interpretation:  Sinus rhythm Minimal ST depression, inferior leads Baseline wander in lead(s) I III aVL aVF V2 V4 No significant change since last tracing Confirmed by Dorie Rank [id number]) on 04/20/2017 4:10:55 PM       Radiology Dg Chest 2 View  Result Date: 04/20/2017 CLINICAL DATA:  Chest pain EXAM: CHEST  2 VIEW COMPARISON:  Chest radiograph and chest CT January 18, 2017 FINDINGS: Lungs are clear. Heart size and pulmonary vascularity are normal. No  adenopathy. There is mild midthoracic dextroscoliosis. IMPRESSION: No edema or consolidation. Electronically Signed   By: Lowella Grip III M.D.   On: 04/20/2017 16:41    Procedures Procedures (including critical care time)  Medications Ordered in ED Medications - No data to display   Initial Impression / Assessment and Plan / ED Course  I have reviewed the triage vital signs and the nursing notes.  Pertinent labs &  imaging results that were available during my care of the patient were reviewed by me and considered in my medical decision making (see chart for details).   Pt presents with chest pain and palpitations in the setting of anxiety.  Doubt cardiac etiology.  Essentially normal EKG.  Low risk wells criteria and perc negative.  Doubt PE.  At this time there does not appear to be any evidence of an acute emergency medical condition and the patient appears stable for discharge with appropriate outpatient follow up.   Final Clinical Impressions(s) / ED Diagnoses   Final diagnoses:  Palpitations  Chest pain, unspecified type    ED Discharge Orders    None       Dorie Rank, MD 04/20/17 [phone]

## 2017-04-20 NOTE — ED Notes (Signed)
Pt returned. Per RR [facility] at [facility]. Pt does not need ob monitor. Monitor d/c

## 2017-04-20 NOTE — ED Notes (Signed)
Pt taken to xray 

## 2017-04-20 NOTE — ED Triage Notes (Signed)
Pt c/o chest palpitation x 2 days. Reports increasing pain in chest radiating to neck and shoulder. Pt also reports sx of yeast infection.

## 2017-04-20 NOTE — ED Notes (Signed)
[facility] Rapid Response RN notified of pt and will place on monitor.

## 2017-04-20 NOTE — Discharge Instructions (Signed)
The tests today were reassuring.  No signs to suggest blood clot or heart attack.  Follow up with your [facility] doctor as planned. Return as needed for worsening symptoms

## 2017-04-20 NOTE — ED Notes (Signed)
FHR 151

## 2017-04-21 ENCOUNTER — Encounter: Payer: Self-pay | Admitting: Advanced Practice Midwife

## 2017-04-21 ENCOUNTER — Ambulatory Visit (INDEPENDENT_AMBULATORY_CARE_PROVIDER_SITE_OTHER): Payer: Medicaid Other | Admitting: Advanced Practice Midwife

## 2017-04-21 VITALS — BP 114/60 | HR 120 | Wt 137.0 lb

## 2017-04-21 DIAGNOSIS — Z331 Pregnant state, incidental: Secondary | ICD-10-CM | POA: Diagnosis not present

## 2017-04-21 DIAGNOSIS — Z1389 Encounter for screening for other disorder: Secondary | ICD-10-CM | POA: Diagnosis not present

## 2017-04-21 DIAGNOSIS — Z3A22 22 weeks gestation of pregnancy: Secondary | ICD-10-CM | POA: Diagnosis not present

## 2017-04-21 DIAGNOSIS — O99341 Other mental disorders complicating pregnancy, first trimester: Secondary | ICD-10-CM | POA: Diagnosis not present

## 2017-04-21 DIAGNOSIS — Z113 Encounter for screening for infections with a predominantly sexual mode of transmission: Secondary | ICD-10-CM

## 2017-04-21 DIAGNOSIS — Z79899 Other long term (current) drug therapy: Secondary | ICD-10-CM | POA: Diagnosis not present

## 2017-04-21 DIAGNOSIS — Z3482 Encounter for supervision of other normal pregnancy, second trimester: Secondary | ICD-10-CM

## 2017-04-21 DIAGNOSIS — F418 Other specified anxiety disorders: Secondary | ICD-10-CM

## 2017-04-21 DIAGNOSIS — O99321 Drug use complicating pregnancy, first trimester: Secondary | ICD-10-CM

## 2017-04-21 LAB — POCT URINALYSIS DIPSTICK
GLUCOSE UA: NEGATIVE
Ketones, UA: NEGATIVE
LEUKOCYTES UA: NEGATIVE
NITRITE UA: NEGATIVE
Protein, UA: NEGATIVE
RBC UA: NEGATIVE

## 2017-04-21 MED ORDER — CLONAZEPAM 1 MG PO TABS: 1.0000 mg | ORAL_TABLET | Freq: Three times a day (TID) | ORAL | 0 refills | Status: DC

## 2017-04-21 NOTE — Progress Notes (Signed)
LOW-RISK PREGNANCY VISIT Patient name: Patricia Richards MRN [id number]  Date of birth: 02/21/1991 Chief Complaint:   Routine Prenatal Visit (vaginal itching-cream not working)  History of Present Illness:   Patricia Richards is a 26 y.o. [phone] female at [redacted]w[redacted]d with an Estimated Date of Delivery: 08/24/17 being seen today for ongoing management of a low-risk pregnancy.  Today she reports feelng like her heart is racing all the time.  went to ED yesterday, EKG normal, .Says needs to increase her Klonopin (was on 1mg  TID before pregnancy). Discussed (again) how effective CBT or TMS can be for anxiety, pt not interested.  Worried FOB is cheating, wants vaginal STD testing.  States has been itching, has OTC yeast med in now.  SSE:  All I can see is cream.No odor. Wet prep inconclusive. NuSwab taken. Contractions: Not present. Vag. Bleeding: None.   . denies leaking of fluid. Review of Systems:   Pertinent items are noted in HPI Denies headaches, visual changes, shortness of breath, chest pain, abdominal pain, severe nausea/vomiting, or problems with urination or bowel movements unless otherwise stated above.  Pertinent History Reviewed:  Medical & Surgical Hx:   Past Medical History:  Diagnosis Date  . 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 Anemia   . Bacterial infection   . Bipolar 1 disorder ([place])   . Depression    on meds now, doing well  . Herpes   . Infection    UTI  . Kidney stones   . 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 Panic attacks   . Pregnant   . Urinary tract infection    gets them frequently  . Vaginal Pap smear, abnormal    Past Surgical History:  Procedure Laterality Date  . DILATION AND EVACUATION  11/28/2010   Family History  Problem Relation Age of Onset  . Hypertension Father   . Cancer Father        skin  . Mental illness Father   . Cancer Maternal Grandmother   . Mental  illness Maternal Grandmother   . Cerebral palsy Mother   . Other Brother        problems with gums  . Heart attack Paternal Grandmother   . Down syndrome Cousin   . Bipolar disorder Cousin     Current Outpatient Medications:  .  clonazePAM (KLONOPIN) 1 MG tablet, Take 1 tablet (1 mg total) by mouth 3 (three) times daily., Disp: 90 tablet, Rfl: 0 .  pantoprazole (PROTONIX) 20 MG tablet, Take 1 tablet (20 mg total) by mouth daily., Disp: 30 tablet, Rfl: 6 .  acetaminophen (TYLENOL) 500 MG tablet, Take 500 mg by mouth every 6 (six) hours as needed for mild pain or moderate pain., Disp: , Rfl:  Social History: Reviewed -  reports that she quit smoking about 3 years ago. Her smoking use included cigarettes. she has never used smokeless tobacco.  Physical Assessment:   Vitals:   04/21/17 1144  BP: 114/60  Pulse: (!) 120  Weight: 137 lb (62.1 kg)  Body mass index is 23.52 kg/m.        Physical Examination:   General appearance: Well appearing, and in no distress  Mental status: Alert, oriented to person, place, and time  Skin: Warm & dry  Cardiovascular: Normal heart rate noted  Respiratory: Normal respiratory effort, no distress  Abdomen: Soft, gravid, nontender  Pelvic: Cervical exam  deferred         Extremities: Edema: None  Fetal Status:          Results for orders placed or performed in visit on 04/21/17 (from the past 24 hour(s))  POCT urinalysis dipstick   Collection Time: 04/21/17 11:49 AM  Result Value Ref Range   Color, UA     Clarity, UA     Glucose, UA neg    Bilirubin, UA     Ketones, UA neg    Spec Grav, UA  1.010 - 1.025   Blood, UA neg    pH, UA  5.0 - 8.0   Protein, UA neg    Urobilinogen, UA  0.2 or 1.0 E.U./dL   Nitrite, UA neg    Leukocytes, UA Negative Negative  Results for orders placed or performed during the hospital encounter of 04/20/17 (from the past 24 hour(s))  CBC   Collection Time: 04/20/17  4:08 PM  Result Value Ref Range   WBC 9.2 4.0  - 10.5 K/uL   RBC 3.62 (L) 3.87 - 5.11 MIL/uL   Hemoglobin 11.0 (L) 12.0 - 15.0 g/dL   HCT 33.9 (L) 36.0 - 46.0 %   MCV 93.6 78.0 - 100.0 fL   MCH 30.4 26.0 - 34.0 pg   MCHC 32.4 30.0 - 36.0 g/dL   RDW 13.5 11.5 - 15.5 %   Platelets 241 150 - 400 K/uL  Basic metabolic panel   Collection Time: 04/20/17  4:08 PM  Result Value Ref Range   Sodium 135 135 - 145 mmol/L   Potassium 3.2 (L) 3.5 - 5.1 mmol/L   Chloride 104 101 - 111 mmol/L   CO2 23 22 - 32 mmol/L   Glucose, Bld 82 65 - 99 mg/dL   BUN 9 6 - 20 mg/dL   Creatinine, Ser 0.44 0.44 - 1.00 mg/dL   Calcium 9.2 8.9 - 10.3 mg/dL   GFR calc non Af Amer >60 >60 mL/min   GFR calc Af Amer >60 >60 mL/min   Anion gap 8 5 - 15  I-stat troponin, ED   Collection Time: 04/20/17  5:34 PM  Result Value Ref Range   Troponin i, poc 0.00 0.00 - 0.08 ng/mL   Comment 3            Assessment & Plan:  1) Low-risk pregnancy [id number] at 101w1d with an Estimated Date of Delivery: 08/24/17   2) anxiety, Discussed w/LHE and he OK's increasing klonopin to 1mg  TID.  Pt still encouraged to try CBT, let me know if she changes her mind   Labs/procedures/US today: NuSwab  Plan:  Continue routine obstetrical care    Follow-up: Return in about 3 weeks (around 05/12/2017) for LROB w/LHE, med check.  Orders Placed This Encounter  Procedures  . NuSwab Vaginitis Plus (VG+)  . POCT urinalysis dipstick   CRESENZO-DISHMAN,Mae Denunzio CNM 04/21/2017 12:28 PM

## 2017-04-24 LAB — NUSWAB VAGINITIS PLUS (VG+)
CANDIDA ALBICANS, NAA: NEGATIVE
CANDIDA GLABRATA, NAA: NEGATIVE
Chlamydia trachomatis, NAA: NEGATIVE
NEISSERIA GONORRHOEAE, NAA: NEGATIVE
Trich vag by NAA: NEGATIVE

## 2017-05-05 ENCOUNTER — Telehealth: Payer: Self-pay | Admitting: Obstetrics & Gynecology

## 2017-05-05 MED ORDER — VALACYCLOVIR HCL 500 MG PO TABS: 500.0000 mg | ORAL_TABLET | Freq: Two times a day (BID) | ORAL | 3 refills | Status: AC

## 2017-05-05 NOTE — Telephone Encounter (Signed)
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

## 2017-05-05 NOTE — Telephone Encounter (Signed)
Patient called stating that she would like a call back from the nurse, pt states that she had a severe outbreak and need someone to call her in something for it. Please contact pt.

## 2017-05-05 NOTE — Telephone Encounter (Signed)
Patient states she is having a severe herpes outbreak and needs to prescription. Please advise.

## 2017-05-11 ENCOUNTER — Encounter: Payer: Medicaid Other | Admitting: Obstetrics & Gynecology

## 2017-05-16 ENCOUNTER — Encounter: Payer: Medicaid Other | Admitting: Obstetrics & Gynecology

## 2017-05-17 NOTE — L&D Delivery Note (Signed)
VAGINAL DELIVERY NOTE  27 y.o. [id number] at [redacted]w[redacted]d delivered a viable female infant at 8887 in cephalic, ROA position. No nuchal cord. Left anterior shoulder delivered with ease followed by 60 sec delayed cord clamping. Cord clamped x2 and cut. Placenta delivered spontaneously intact, with 3VC. Fundus firm on exam with massage and pitocin.  Mother: Anesthesia: epidural Laceration: none Suture repair: N/A EBL: 300 mL  Baby: Apgars: 7, 9 Weight: pending Cord pH: not sent  Good hemostasis noted.  Mom to postpartum.  Baby to couplet-care/skin-to-skin.  Harriet Butte, DO, PGY-2 08/26/2017, 10:28 PM

## 2017-05-19 ENCOUNTER — Telehealth: Payer: Self-pay | Admitting: *Deleted

## 2017-05-19 ENCOUNTER — Telehealth: Payer: Self-pay | Admitting: Obstetrics & Gynecology

## 2017-05-19 ENCOUNTER — Encounter: Payer: Self-pay | Admitting: Obstetrics & Gynecology

## 2017-05-19 ENCOUNTER — Ambulatory Visit (INDEPENDENT_AMBULATORY_CARE_PROVIDER_SITE_OTHER): Payer: Medicaid Other | Admitting: Obstetrics & Gynecology

## 2017-05-19 VITALS — BP 112/52 | HR 107 | Wt 143.0 lb

## 2017-05-19 DIAGNOSIS — Z331 Pregnant state, incidental: Secondary | ICD-10-CM

## 2017-05-19 DIAGNOSIS — O99342 Other mental disorders complicating pregnancy, second trimester: Secondary | ICD-10-CM

## 2017-05-19 DIAGNOSIS — F418 Other specified anxiety disorders: Secondary | ICD-10-CM

## 2017-05-19 DIAGNOSIS — Z1389 Encounter for screening for other disorder: Secondary | ICD-10-CM

## 2017-05-19 DIAGNOSIS — Z3A26 26 weeks gestation of pregnancy: Secondary | ICD-10-CM

## 2017-05-19 DIAGNOSIS — Z79899 Other long term (current) drug therapy: Secondary | ICD-10-CM | POA: Diagnosis not present

## 2017-05-19 DIAGNOSIS — Z3483 Encounter for supervision of other normal pregnancy, third trimester: Secondary | ICD-10-CM

## 2017-05-19 LAB — POCT URINALYSIS DIPSTICK
GLUCOSE UA: NEGATIVE
KETONES UA: NEGATIVE
Nitrite, UA: NEGATIVE
Protein, UA: NEGATIVE
RBC UA: NEGATIVE

## 2017-05-19 MED ORDER — CLONAZEPAM 1 MG PO TABS: 1.0000 mg | ORAL_TABLET | Freq: Three times a day (TID) | ORAL | 3 refills | Status: DC

## 2017-05-19 NOTE — Progress Notes (Signed)
[id number] [redacted]w[redacted]d Estimated Date of Delivery: 08/24/17  Blood pressure (!) 112/52, pulse (!) 107, weight 143 lb (64.9 kg), last menstrual period 11/17/2016, not currently breastfeeding.   BP weight and urine results all reviewed and noted.  Please refer to the obstetrical flow sheet for the fundal height and fetal heart rate documentation:  Patient reports good fetal movement, denies any bleeding and no rupture of membranes symptoms or regular contractions. Patient is without complaints. All questions were answered.  Orders Placed This Encounter  Procedures  . POCT urinalysis dipstick    Plan:  Continued routine obstetrical care, PN2 next visit  Return in about 2 weeks (around 06/02/2017) for PN2, , LROB.

## 2017-05-19 NOTE — Telephone Encounter (Signed)
Patient called for refill on clonazepam, states she is completely out of medication. Please advise.

## 2017-05-19 NOTE — Telephone Encounter (Signed)
refilled 

## 2017-05-20 ENCOUNTER — Other Ambulatory Visit: Payer: Self-pay | Admitting: Obstetrics & Gynecology

## 2017-05-20 MED ORDER — CLONAZEPAM 1 MG PO TABS: 1.0000 mg | ORAL_TABLET | Freq: Three times a day (TID) | ORAL | 3 refills | Status: DC

## 2017-05-20 NOTE — Telephone Encounter (Signed)
Meds ordered this encounter  Medications  . clonazePAM (KLONOPIN) 1 MG tablet    Sig: Take 1 tablet (1 mg total) by mouth 3 (three) times daily.    Dispense:  90 tablet    Refill:  3

## 2017-06-02 ENCOUNTER — Ambulatory Visit (INDEPENDENT_AMBULATORY_CARE_PROVIDER_SITE_OTHER): Payer: Medicaid Other | Admitting: Advanced Practice Midwife

## 2017-06-02 ENCOUNTER — Other Ambulatory Visit: Payer: Medicaid Other

## 2017-06-02 ENCOUNTER — Encounter: Payer: Self-pay | Admitting: Advanced Practice Midwife

## 2017-06-02 VITALS — BP 102/64 | HR 104 | Wt 145.0 lb

## 2017-06-02 DIAGNOSIS — Z3483 Encounter for supervision of other normal pregnancy, third trimester: Secondary | ICD-10-CM

## 2017-06-02 DIAGNOSIS — Z3A28 28 weeks gestation of pregnancy: Secondary | ICD-10-CM

## 2017-06-02 DIAGNOSIS — O99342 Other mental disorders complicating pregnancy, second trimester: Secondary | ICD-10-CM | POA: Diagnosis not present

## 2017-06-02 DIAGNOSIS — F418 Other specified anxiety disorders: Secondary | ICD-10-CM | POA: Diagnosis not present

## 2017-06-02 DIAGNOSIS — Z331 Pregnant state, incidental: Secondary | ICD-10-CM

## 2017-06-02 DIAGNOSIS — Z1389 Encounter for screening for other disorder: Secondary | ICD-10-CM

## 2017-06-02 DIAGNOSIS — Z131 Encounter for screening for diabetes mellitus: Secondary | ICD-10-CM

## 2017-06-02 LAB — POCT URINALYSIS DIPSTICK
Glucose, UA: NEGATIVE
KETONES UA: NEGATIVE
LEUKOCYTES UA: NEGATIVE
NITRITE UA: NEGATIVE
PROTEIN UA: NEGATIVE
RBC UA: NEGATIVE

## 2017-06-02 NOTE — Progress Notes (Signed)
[id number] [redacted]w[redacted]d Estimated Date of Delivery: 08/24/17  Blood pressure 102/64, pulse (!) 104, weight 145 lb (65.8 kg), last menstrual period 11/17/2016, not currently breastfeeding.   BP weight and urine results all reviewed and noted.  Please refer to the obstetrical flow sheet for the fundal height and fetal heart rate documentation:  Patient reports good fetal movement, denies any bleeding and no rupture of membranes symptoms or regular contractions. Patient is without complaints. Says bottle of Klonopin says no refills, but Epic says 3.  I got locked out of [employer] for wrong password, so pt is to call pharmacy and make sure there are actually refills in their computer. If not, pharmacy can contact us and we can fix it  All questions were answered.  Orders Placed This Encounter  Procedures  . POCT urinalysis dipstick    Plan:  Continued routine obstetrical care, PN2 today  Return in about 3 weeks (around 06/23/2017) for LROB.

## 2017-06-02 NOTE — Patient Instructions (Signed)
Patricia Richards, I greatly value your feedback.  If you receive a survey following your visit with [place] today, we appreciate you taking the time to fill it out.  Thanks, [name], CNM   Call the office [phone]) or go to [facility] if:  You begin to have strong, frequent contractions  Your water breaks.  Sometimes it is a big gush of fluid, sometimes it is just a trickle that keeps getting your panties wet or running down your legs  You have vaginal bleeding.  It is normal to have a small amount of spotting if your cervix was checked.   You don't feel your baby moving like normal.  If you don't, get you something to eat and drink and lay down and focus on feeling your baby move.  You should feel at least 10 movements in 2 hours.  If you don't, you should call the office or go to [facility].    Tdap Vaccine  It is recommended that you get the Tdap vaccine during the third trimester of EACH pregnancy to help protect your baby from getting pertussis (whooping cough)  27-36 weeks is the BEST time to do this so that you can pass the protection on to your baby. During pregnancy is better than after pregnancy, but if you are unable to get it during pregnancy it will be offered at the hospital.   You can get this vaccine at the health department or your family doctor  Everyone who will be around your baby should also be up-to-date on their vaccines. Adults (who are not pregnant) only need 1 dose of Tdap during adulthood.   Third Trimester of Pregnancy The third trimester is from week 29 through week 42, months 7 through 9. The third trimester is a time when the fetus is growing rapidly. At the end of the ninth month, the fetus is about 20 inches in length and weighs 6-10 pounds.  BODY CHANGES Your body goes through many changes during pregnancy. The changes vary from woman to woman.   Your weight will continue to increase. You can expect to gain 25-35 pounds (11-16 kg)  by the end of the pregnancy.  You may begin to get stretch marks on your hips, abdomen, and breasts.  You may urinate more often because the fetus is moving lower into your pelvis and pressing on your bladder.  You may develop or continue to have heartburn as a result of your pregnancy.  You may develop constipation because certain hormones are causing the muscles that push waste through your intestines to slow down.  You may develop hemorrhoids or swollen, bulging veins (varicose veins).  You may have pelvic pain because of the weight gain and pregnancy hormones relaxing your joints between the bones in your pelvis. Backaches may result from overexertion of the muscles supporting your posture.  You may have changes in your hair. These can include thickening of your hair, rapid growth, and changes in texture. Some women also have hair loss during or after pregnancy, or hair that feels dry or thin. Your hair will most likely return to normal after your baby is born.  Your breasts will continue to grow and be tender. A yellow discharge may leak from your breasts called colostrum.  Your belly button may stick out.  You may feel short of breath because of your expanding uterus.  You may notice the fetus "dropping," or moving lower in your abdomen.  You may have a bloody mucus  discharge. This usually occurs a few days to a week before labor begins.  Your cervix becomes thin and soft (effaced) near your due date. WHAT TO EXPECT AT YOUR PRENATAL EXAMS  You will have prenatal exams every 2 weeks until week 36. Then, you will have weekly prenatal exams. During a routine prenatal visit:  You will be weighed to make sure you and the fetus are growing normally.  Your blood pressure is taken.  Your abdomen will be measured to track your baby's growth.  The fetal heartbeat will be listened to.  Any test results from the previous visit will be discussed.  You may have a cervical check near  your due date to see if you have effaced. At around 36 weeks, your caregiver will check your cervix. At the same time, your caregiver will also perform a test on the secretions of the vaginal tissue. This test is to determine if a type of bacteria, Group B streptococcus, is present. Your caregiver will explain this further. Your caregiver may ask you:  What your birth plan is.  How you are feeling.  If you are feeling the baby move.  If you have had any abnormal symptoms, such as leaking fluid, bleeding, severe headaches, or abdominal cramping.  If you have any questions. Other tests or screenings that may be performed during your third trimester include:  Blood tests that check for low iron levels (anemia).  Fetal testing to check the health, activity level, and growth of the fetus. Testing is done if you have certain medical conditions or if there are problems during the pregnancy. FALSE LABOR You may feel small, irregular contractions that eventually go away. These are called Braxton Hicks contractions, or false labor. Contractions may last for hours, days, or even weeks before true labor sets in. If contractions come at regular intervals, intensify, or become painful, it is best to be seen by your caregiver.  SIGNS OF LABOR   Menstrual-like cramps.  Contractions that are 5 minutes apart or less.  Contractions that start on the top of the uterus and spread down to the lower abdomen and back.  A sense of increased pelvic pressure or back pain.  A watery or bloody mucus discharge that comes from the vagina. If you have any of these signs before the 37th week of pregnancy, call your caregiver right away. You need to go to the hospital to get checked immediately. HOME CARE INSTRUCTIONS   Avoid all smoking, herbs, alcohol, and unprescribed drugs. These chemicals affect the formation and growth of the baby.  Follow your caregiver's instructions regarding medicine use. There are  medicines that are either safe or unsafe to take during pregnancy.  Exercise only as directed by your caregiver. Experiencing uterine cramps is a good sign to stop exercising.  Continue to eat regular, healthy meals.  Wear a good support bra for breast tenderness.  Do not use hot tubs, steam rooms, or saunas.  Wear your seat belt at all times when driving.  Avoid raw meat, uncooked cheese, cat litter boxes, and soil used by cats. These carry germs that can cause birth defects in the baby.  Take your prenatal vitamins.  Try taking a stool softener (if your caregiver approves) if you develop constipation. Eat more high-fiber foods, such as fresh vegetables or fruit and whole grains. Drink plenty of fluids to keep your urine clear or pale yellow.  Take warm sitz baths to soothe any pain or discomfort caused by hemorrhoids. Use  hemorrhoid cream if your caregiver approves.  If you develop varicose veins, wear support hose. Elevate your feet for 15 minutes, 3-4 times a day. Limit salt in your diet.  Avoid heavy lifting, wear low heal shoes, and practice good posture.  Rest a lot with your legs elevated if you have leg cramps or low back pain.  Visit your dentist if you have not gone during your pregnancy. Use a soft toothbrush to brush your teeth and be gentle when you floss.  A sexual relationship may be continued unless your caregiver directs you otherwise.  Do not travel far distances unless it is absolutely necessary and only with the approval of your caregiver.  Take prenatal classes to understand, practice, and ask questions about the labor and delivery.  Make a trial run to the hospital.  Pack your hospital bag.  Prepare the baby's nursery.  Continue to go to all your prenatal visits as directed by your caregiver. SEEK MEDICAL CARE IF:  You are unsure if you are in labor or if your water has broken.  You have dizziness.  You have mild pelvic cramps, pelvic pressure, or  nagging pain in your abdominal area.  You have persistent nausea, vomiting, or diarrhea.  You have a bad smelling vaginal discharge.  You have pain with urination. SEEK IMMEDIATE MEDICAL CARE IF:   You have a fever.  You are leaking fluid from your vagina.  You have spotting or bleeding from your vagina.  You have severe abdominal cramping or pain.  You have rapid weight loss or gain.  You have shortness of breath with chest pain.  You notice sudden or extreme swelling of your face, hands, ankles, feet, or legs.  You have not felt your baby move in over an hour.  You have severe headaches that do not go away with medicine.  You have vision changes. Document Released: 04/27/2001 Document Revised: 05/08/2013 Document Reviewed: 07/04/2012 [facility] Patient Information 2015 [place], [place]. This information is not intended to replace advice given to you by your health care provider. Make sure you discuss any questions you have with your health care provider.

## 2017-06-03 LAB — GLUCOSE TOLERANCE, 2 HOURS W/ 1HR
Glucose, 1 hour: 124 mg/dL (ref 65–179)
Glucose, 2 hour: 91 mg/dL (ref 65–152)
Glucose, Fasting: 80 mg/dL (ref 65–91)

## 2017-06-03 LAB — CBC
Hematocrit: 30.3 % — ABNORMAL LOW (ref 34.0–46.6)
Hemoglobin: 10.3 g/dL — ABNORMAL LOW (ref 11.1–15.9)
MCH: 30.7 pg (ref 26.6–33.0)
MCHC: 34 g/dL (ref 31.5–35.7)
MCV: 90 fL (ref 79–97)
PLATELETS: 248 10*3/uL (ref 150–379)
RBC: 3.36 x10E6/uL — AB (ref 3.77–5.28)
RDW: 13.4 % (ref 12.3–15.4)
WBC: 8.7 10*3/uL (ref 3.4–10.8)

## 2017-06-03 LAB — RPR: RPR Ser Ql: NONREACTIVE

## 2017-06-03 LAB — HIV ANTIBODY (ROUTINE TESTING W REFLEX): HIV Screen 4th Generation wRfx: NONREACTIVE

## 2017-06-03 LAB — ANTIBODY SCREEN: Antibody Screen: NEGATIVE

## 2017-06-23 ENCOUNTER — Encounter: Payer: Self-pay | Admitting: Obstetrics & Gynecology

## 2017-06-23 ENCOUNTER — Ambulatory Visit (INDEPENDENT_AMBULATORY_CARE_PROVIDER_SITE_OTHER): Payer: Medicaid Other | Admitting: Obstetrics & Gynecology

## 2017-06-23 VITALS — BP 104/52 | HR 100 | Wt 147.0 lb

## 2017-06-23 DIAGNOSIS — Z331 Pregnant state, incidental: Secondary | ICD-10-CM

## 2017-06-23 DIAGNOSIS — Z3A31 31 weeks gestation of pregnancy: Secondary | ICD-10-CM | POA: Diagnosis not present

## 2017-06-23 DIAGNOSIS — O4693 Antepartum hemorrhage, unspecified, third trimester: Secondary | ICD-10-CM | POA: Diagnosis not present

## 2017-06-23 DIAGNOSIS — Z1389 Encounter for screening for other disorder: Secondary | ICD-10-CM | POA: Diagnosis not present

## 2017-06-23 DIAGNOSIS — Z3483 Encounter for supervision of other normal pregnancy, third trimester: Secondary | ICD-10-CM

## 2017-06-23 LAB — POCT URINALYSIS DIPSTICK
Glucose, UA: NEGATIVE
Ketones, UA: NEGATIVE
Nitrite, UA: NEGATIVE
Protein, UA: NEGATIVE

## 2017-06-23 NOTE — Progress Notes (Signed)
[id number] [redacted]w[redacted]d Estimated Date of Delivery: 08/24/17  Blood pressure (!) 104/52, pulse 100, weight 147 lb (66.7 kg), last menstrual period 11/17/2016, not currently breastfeeding.   BP weight and urine results all reviewed and noted.  Please refer to the obstetrical flow sheet for the fundal height and fetal heart rate documentation:  Patient reports good fetal movement, denies any bleeding and no rupture of membranes symptoms or regular contractions. Patient is without complaints. All questions were answered.  Orders Placed This Encounter  Procedures  . RHO (D) Immune Globulin  . POCT Urinalysis Dipstick    Plan:  Continued routine obstetrical care, pt had intercourse last night and had some bleeding when wipes since then less now to none compared, she has also had some mucous also SSE no blood in vault, reactive NST, cervix LTC soft, consistent with mutiparity RhoGam given  Return in about 2 weeks (around 07/07/2017) for LROB.

## 2017-07-07 ENCOUNTER — Encounter: Payer: Medicaid Other | Admitting: Advanced Practice Midwife

## 2017-07-15 ENCOUNTER — Ambulatory Visit (INDEPENDENT_AMBULATORY_CARE_PROVIDER_SITE_OTHER): Payer: Medicaid Other | Admitting: Obstetrics and Gynecology

## 2017-07-15 ENCOUNTER — Encounter: Payer: Self-pay | Admitting: Obstetrics and Gynecology

## 2017-07-15 VITALS — BP 110/70 | HR 98 | Wt 151.6 lb

## 2017-07-15 DIAGNOSIS — Z3A34 34 weeks gestation of pregnancy: Secondary | ICD-10-CM

## 2017-07-15 DIAGNOSIS — Z1389 Encounter for screening for other disorder: Secondary | ICD-10-CM | POA: Diagnosis not present

## 2017-07-15 DIAGNOSIS — Z3483 Encounter for supervision of other normal pregnancy, third trimester: Secondary | ICD-10-CM

## 2017-07-15 DIAGNOSIS — O23593 Infection of other part of genital tract in pregnancy, third trimester: Secondary | ICD-10-CM | POA: Diagnosis not present

## 2017-07-15 DIAGNOSIS — N760 Acute vaginitis: Secondary | ICD-10-CM

## 2017-07-15 DIAGNOSIS — Z331 Pregnant state, incidental: Secondary | ICD-10-CM | POA: Diagnosis not present

## 2017-07-15 DIAGNOSIS — B9689 Other specified bacterial agents as the cause of diseases classified elsewhere: Secondary | ICD-10-CM | POA: Diagnosis not present

## 2017-07-15 LAB — POCT URINALYSIS DIPSTICK
Glucose, UA: NEGATIVE
Ketones, UA: NEGATIVE
LEUKOCYTES UA: NEGATIVE
Nitrite, UA: NEGATIVE
RBC UA: NEGATIVE

## 2017-07-15 MED ORDER — CLINDAMYCIN PHOSPHATE 1 % EX GEL: Freq: Two times a day (BID) | CUTANEOUS | 11 refills | Status: DC

## 2017-07-15 NOTE — Progress Notes (Signed)
Patient ID: Patricia Richards, female   DOB: Jun 10, 1990, 27 y.o.   MRN: [id number]   LOW-RISK PREGNANCY VISIT Patient name: Patricia Richards MRN [id number]  Date of birth: 1990-07-24 Chief Complaint:   Routine Prenatal Visit (2/28/ 19 seem [facility] ,leaking fluid/ negative/ contractions)  History of Present Illness:   Patricia Richards is a 27 y.o. [phone] female at [redacted]w[redacted]d with an Estimated Date of Delivery: 08/24/17 being seen today for ongoing management of a low-risk pregnancy.  Today she reports leaking fluid. She was seen at [facility], yesterday 07/14/2017, for same symptoms. She is here for a follow-up to make sure everything is okay. Contractions: Regular.  .  Movement: Present. reports leaking of fluid. Review of Systems:   Pertinent items are noted in HPI Denies abnormal vaginal discharge w/ itching/odor/irritation, headaches, visual changes, shortness of breath, chest pain, abdominal pain, severe nausea/vomiting, or problems with urination or bowel movements unless otherwise stated above. Pertinent History Reviewed:  Reviewed past medical,surgical, social, obstetrical and family history.  Reviewed problem list, medications and allergies. Physical Assessment:   Vitals:   07/15/17 0922  BP: 110/70  Pulse: 98  Weight: 151 lb 9.6 oz (68.8 kg)  Body mass index is 26.02 kg/m.        Physical Examination:   General appearance: Well appearing, and in no distress  Mental status: Alert, oriented to person, place, and time  Skin: Warm & dry  Cardiovascular: Normal heart rate noted  Respiratory: Normal respiratory effort, no distress  Abdomen: Soft, gravid, nontender  Pelvic: Heavy discharge, Positive BV, Positive whiff, positive white cells         Extremities: Edema: Trace  Fetal Status: Fetal Heart Rate (bpm): 135-150 nst Fundal Height: 33 cm Movement: Present    Results for orders placed or performed in visit on 07/15/17 (from the past 24 hour(s))  POCT urinalysis  dipstick   Collection Time: 07/15/17  9:26 AM  Result Value Ref Range   Color, UA     Clarity, UA     Glucose, UA neg    Bilirubin, UA     Ketones, UA neg    Spec Grav, UA  1.010 - 1.025   Blood, UA neg    pH, UA  5.0 - 8.0   Protein, UA trace    Urobilinogen, UA  0.2 or 1.0 E.U./dL   Nitrite, UA neg    Leukocytes, UA Negative Negative   Appearance     Odor      Assessment & Plan:  1) Low-risk pregnancy [id number] at [redacted]w[redacted]d with an Estimated Date of Delivery: 08/24/17  2) BV, Rx metronidazole    Meds: No orders of the defined types were placed in this encounter.  Labs/procedures today: NST  Plan:  Continue routine obstetrical care  Follow-up: No Follow-up on file.  Orders Placed This Encounter  Procedures  . POCT urinalysis dipstick   By signing my name below, I, Margit Banda, attest that this documentation has been prepared under the direction and in the presence of Jonnie Kind, MD. Electronically Signed: Margit Banda, Medical Scribe. 07/15/17. 10:30 AM.

## 2017-07-18 ENCOUNTER — Telehealth: Payer: Self-pay | Admitting: *Deleted

## 2017-07-18 NOTE — Telephone Encounter (Signed)
Needs Pa for Clindamycin

## 2017-07-19 ENCOUNTER — Other Ambulatory Visit: Payer: Self-pay | Admitting: *Deleted

## 2017-07-19 MED ORDER — CLINDAMYCIN PHOSPHATE 100 MG VA SUPP: 100.0000 mg | Freq: Every day | VAGINAL | 0 refills | Status: DC

## 2017-07-29 ENCOUNTER — Ambulatory Visit (INDEPENDENT_AMBULATORY_CARE_PROVIDER_SITE_OTHER): Payer: Medicaid Other | Admitting: Obstetrics & Gynecology

## 2017-07-29 ENCOUNTER — Encounter: Payer: Self-pay | Admitting: Obstetrics & Gynecology

## 2017-07-29 VITALS — BP 100/70 | HR 98 | Wt 155.0 lb

## 2017-07-29 DIAGNOSIS — Z331 Pregnant state, incidental: Secondary | ICD-10-CM

## 2017-07-29 DIAGNOSIS — Z1389 Encounter for screening for other disorder: Secondary | ICD-10-CM

## 2017-07-29 DIAGNOSIS — Z3483 Encounter for supervision of other normal pregnancy, third trimester: Secondary | ICD-10-CM

## 2017-07-29 DIAGNOSIS — Z3A36 36 weeks gestation of pregnancy: Secondary | ICD-10-CM | POA: Diagnosis not present

## 2017-07-29 LAB — POCT URINALYSIS DIPSTICK
Glucose, UA: NEGATIVE
KETONES UA: NEGATIVE
Leukocytes, UA: NEGATIVE
Nitrite, UA: NEGATIVE
Protein, UA: NEGATIVE
RBC UA: NEGATIVE

## 2017-07-29 NOTE — Progress Notes (Signed)
[id number] [redacted]w[redacted]d Estimated Date of Delivery: 08/24/17  Blood pressure 100/70, pulse 98, weight 155 lb (70.3 kg), last menstrual period 11/17/2016, not currently breastfeeding.   BP weight and urine results all reviewed and noted.  Please refer to the obstetrical flow sheet for the fundal height and fetal heart rate documentation:  Patient reports good fetal movement, denies any bleeding and no rupture of membranes symptoms or regular contractions. Patient is without complaints. All questions were answered.  Orders Placed This Encounter  Procedures  . POCT urinalysis dipstick    Plan:  Continued routine obstetrical care, GBS done No abnormal discharge, just cervical mucous  Return in about 1 week (around 08/05/2017) for LROB.

## 2017-07-29 NOTE — Addendum Note (Signed)
Addended by: Diona Fanti A on: 07/29/2017 12:49 PM   Modules accepted: Orders

## 2017-07-31 LAB — STREP GP B NAA: STREP GROUP B AG: NEGATIVE

## 2017-08-02 LAB — GC/CHLAMYDIA PROBE AMP
CHLAMYDIA, DNA PROBE: NEGATIVE
NEISSERIA GONORRHOEAE BY PCR: NEGATIVE

## 2017-08-05 ENCOUNTER — Ambulatory Visit (INDEPENDENT_AMBULATORY_CARE_PROVIDER_SITE_OTHER): Payer: Medicaid Other | Admitting: Women's Health

## 2017-08-05 ENCOUNTER — Encounter: Payer: Self-pay | Admitting: Women's Health

## 2017-08-05 VITALS — BP 100/60 | HR 98 | Wt 156.0 lb

## 2017-08-05 DIAGNOSIS — Z87898 Personal history of other specified conditions: Secondary | ICD-10-CM

## 2017-08-05 DIAGNOSIS — O26899 Other specified pregnancy related conditions, unspecified trimester: Secondary | ICD-10-CM

## 2017-08-05 DIAGNOSIS — F1991 Other psychoactive substance use, unspecified, in remission: Secondary | ICD-10-CM

## 2017-08-05 DIAGNOSIS — O9989 Other specified diseases and conditions complicating pregnancy, childbirth and the puerperium: Secondary | ICD-10-CM

## 2017-08-05 DIAGNOSIS — Z79899 Other long term (current) drug therapy: Secondary | ICD-10-CM | POA: Diagnosis not present

## 2017-08-05 DIAGNOSIS — Z3A37 37 weeks gestation of pregnancy: Secondary | ICD-10-CM | POA: Diagnosis not present

## 2017-08-05 DIAGNOSIS — Z1389 Encounter for screening for other disorder: Secondary | ICD-10-CM

## 2017-08-05 DIAGNOSIS — Z3483 Encounter for supervision of other normal pregnancy, third trimester: Secondary | ICD-10-CM

## 2017-08-05 DIAGNOSIS — Z331 Pregnant state, incidental: Secondary | ICD-10-CM | POA: Diagnosis not present

## 2017-08-05 DIAGNOSIS — Z6791 Unspecified blood type, Rh negative: Secondary | ICD-10-CM

## 2017-08-05 LAB — POCT URINALYSIS DIPSTICK
Blood, UA: NEGATIVE
GLUCOSE UA: NEGATIVE
KETONES UA: NEGATIVE
Leukocytes, UA: NEGATIVE
Nitrite, UA: NEGATIVE
Protein, UA: NEGATIVE

## 2017-08-05 NOTE — Progress Notes (Signed)
   LOW-RISK PREGNANCY VISIT Patient name: Patricia Richards MRN [id number]  Date of birth: 1990/07/24 Chief Complaint:   No chief complaint on file.  History of Present Illness:   Patricia Richards is a 27 y.o. [phone] female at [redacted]w[redacted]d with an Estimated Date of Delivery: 08/24/17 being seen today for ongoing management of a low-risk pregnancy.  Today she reports went to [facility] ER yesterday thought she was in labor. Drank a beer few weeks ago to help her pee. 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 Denies any recent outbreaks, taking acyclovir tid as directed-states she has rx at home that was given. Contractions: Regular.  .  Movement: Present. denies leaking of fluid. Review of Systems:   Pertinent items are noted in HPI Denies abnormal vaginal discharge w/ itching/odor/irritation, headaches, visual changes, shortness of breath, chest pain, abdominal pain, severe nausea/vomiting, or problems with urination or bowel movements unless otherwise stated above. Pertinent History Reviewed:  Reviewed past medical,surgical, social, obstetrical and family history.  Reviewed problem list, medications and allergies. Physical Assessment:   Vitals:   08/05/17 1012  BP: 100/60  Pulse: 98  Weight: 156 lb (70.8 kg)  Body mass index is 26.78 kg/m.        Physical Examination:   General appearance: Well appearing, and in no distress  Mental status: Alert, oriented to person, place, and time  Skin: Warm & dry  Cardiovascular: Normal heart rate noted  Respiratory: Normal respiratory effort, no distress  Abdomen: Soft, gravid, nontender  Pelvic: Cervical exam performed  Dilation: 1.5 Effacement (%): 50 Station: -2  Extremities: Edema: None  Fetal Status: Fetal Heart Rate (bpm): 142 Fundal Height: 36 cm Movement: Present Presentation: Vertex  Results for orders placed or performed in visit on 08/05/17 (from the past 24 hour(s))  POCT urinalysis  dipstick   Collection Time: 08/05/17 10:18 AM  Result Value Ref Range   Color, UA     Clarity, UA     Glucose, UA neg    Bilirubin, UA     Ketones, UA neg    Spec Grav, UA  1.010 - 1.025   Blood, UA neg    pH, UA  5.0 - 8.0   Protein, UA neg    Urobilinogen, UA  0.2 or 1.0 E.U./dL   Nitrite, UA neg    Leukocytes, UA Negative Negative   Appearance     Odor      Assessment & Plan:  1) Low-risk pregnancy [id number] at [redacted]w[redacted]d with an Estimated Date of Delivery: 1/51/76   2) H/O illicit drug abuse, 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  Labs/procedures today: sve  Plan:  Continue routine obstetrical care   Reviewed: Term labor symptoms and general obstetric precautions including but not limited to vaginal bleeding, contractions, leaking of fluid and fetal movement were reviewed in detail with the patient.  All questions were answered  Follow-up: Return in about 1 week (around 08/12/2017) for [place].  Orders Placed This Encounter  Procedures  . Pain Management Screening Profile (10S)  . POCT urinalysis dipstick   Roma Schanz CNM, [facility] 08/05/2017 10:59 AM

## 2017-08-05 NOTE — Patient Instructions (Signed)
Lonie Peak, I greatly value your feedback.  If you receive a survey following your visit with [place] today, we appreciate you taking the time to fill it out.  Thanks, Knute Neu, CNM, WHNP-BC   Call the office [phone]) or go to [facility] if:  You begin to have strong, frequent contractions  Your water breaks.  Sometimes it is a big gush of fluid, sometimes it is just a trickle that keeps getting your panties wet or running down your legs  You have vaginal bleeding.  It is normal to have a small amount of spotting if your cervix was checked.   You don't feel your baby moving like normal.  If you don't, get you something to eat and drink and lay down and focus on feeling your baby move.  You should feel at least 10 movements in 2 hours.  If you don't, you should call the office or go to [place] Contractions Contractions of the uterus can occur throughout pregnancy, but they are not always a sign that you are in labor. You may have practice contractions called Braxton Hicks contractions. These false labor contractions are sometimes confused with true labor. What are [place] contractions? Braxton Hicks contractions are tightening movements that occur in the muscles of the uterus before labor. Unlike true labor contractions, these contractions do not result in opening (dilation) and thinning of the cervix. Toward the end of pregnancy (32-34 weeks), Braxton Hicks contractions can happen more often and may become stronger. These contractions are sometimes difficult to tell apart from true labor because they can be very uncomfortable. You should not feel embarrassed if you go to the hospital with false labor. Sometimes, the only way to tell if you are in true labor is for your health care provider to look for changes in the cervix. The health care provider will do a physical exam and may monitor your contractions. If you are not in true labor, the exam should  show that your cervix is not dilating and your water has not broken. If there are other health problems associated with your pregnancy, it is completely safe for you to be sent home with false labor. You may continue to have Braxton Hicks contractions until you go into true labor. How to tell the difference between true labor and false labor True labor  Contractions last 30-70 seconds.  Contractions become very regular.  Discomfort is usually felt in the top of the uterus, and it spreads to the lower abdomen and low back.  Contractions do not go away with walking.  Contractions usually become more intense and increase in frequency.  The cervix dilates and gets thinner. False labor  Contractions are usually shorter and not as strong as true labor contractions.  Contractions are usually irregular.  Contractions are often felt in the front of the lower abdomen and in the groin.  Contractions may go away when you walk around or change positions while lying down.  Contractions get weaker and are shorter-lasting as time goes on.  The cervix usually does not dilate or become thin. Follow these instructions at home:  Take over-the-counter and prescription medicines only as told by your health care provider.  Keep up with your usual exercises and follow other instructions from your health care provider.  Eat and drink lightly if you think you are going into labor.  If Braxton Hicks contractions are making you uncomfortable: ? Change your position from lying  down or resting to walking, or change from walking to resting. ? Sit and rest in a tub of warm water. ? Drink enough fluid to keep your urine pale yellow. Dehydration may cause these contractions. ? Do slow and deep breathing several times an hour.  Keep all follow-up prenatal visits as told by your health care provider. This is important. Contact a health care provider if:  You have a fever.  You have continuous pain in  your abdomen. Get help right away if:  Your contractions become stronger, more regular, and closer together.  You have fluid leaking or gushing from your vagina.  You pass blood-tinged mucus (bloody show).  You have bleeding from your vagina.  You have low back pain that you never had before.  You feel your baby's head pushing down and causing pelvic pressure.  Your baby is not moving inside you as much as it used to. Summary  Contractions that occur before labor are called Braxton Hicks contractions, false labor, or practice contractions.  Braxton Hicks contractions are usually shorter, weaker, farther apart, and less regular than true labor contractions. True labor contractions usually become progressively stronger and regular and they become more frequent.  Manage discomfort from [facility] contractions by changing position, resting in a warm bath, drinking plenty of water, or practicing deep breathing. This information is not intended to replace advice given to you by your health care provider. Make sure you discuss any questions you have with your health care provider. Document Released: 09/16/2016 Document Revised: 09/16/2016 Document Reviewed: 09/16/2016 Elsevier Interactive Patient Education  2018 [employer].

## 2017-08-06 LAB — MED LIST OPTION NOT SELECTED

## 2017-08-08 LAB — PMP SCREEN PROFILE (10S), URINE
AMPHETAMINE SCREEN URINE: NEGATIVE ng/mL
BARBITURATE SCREEN URINE: NEGATIVE ng/mL
BENZODIAZEPINE SCREEN, URINE: NEGATIVE ng/mL
CANNABINOIDS UR QL SCN: NEGATIVE ng/mL
COCAINE(METAB.)SCREEN, URINE: NEGATIVE ng/mL
Creatinine(Crt), U: 80.5 mg/dL (ref 20.0–300.0)
METHADONE SCREEN, URINE: NEGATIVE ng/mL
OXYCODONE+OXYMORPHONE UR QL SCN: NEGATIVE ng/mL
Opiate Scrn, Ur: POSITIVE ng/mL — AB
PH UR, DRUG SCRN: 7.3 (ref 4.5–8.9)
PHENCYCLIDINE QUANTITATIVE URINE: NEGATIVE ng/mL
PROPOXYPHENE SCREEN URINE: NEGATIVE ng/mL

## 2017-08-11 ENCOUNTER — Encounter: Payer: Medicaid Other | Admitting: Advanced Practice Midwife

## 2017-08-18 ENCOUNTER — Ambulatory Visit (INDEPENDENT_AMBULATORY_CARE_PROVIDER_SITE_OTHER): Payer: Medicaid Other | Admitting: Women's Health

## 2017-08-18 ENCOUNTER — Encounter: Payer: Self-pay | Admitting: Women's Health

## 2017-08-18 VITALS — BP 120/70 | HR 93 | Wt 160.6 lb

## 2017-08-18 DIAGNOSIS — Z3A39 39 weeks gestation of pregnancy: Secondary | ICD-10-CM

## 2017-08-18 DIAGNOSIS — Z331 Pregnant state, incidental: Secondary | ICD-10-CM

## 2017-08-18 DIAGNOSIS — Z3483 Encounter for supervision of other normal pregnancy, third trimester: Secondary | ICD-10-CM

## 2017-08-18 DIAGNOSIS — Z1389 Encounter for screening for other disorder: Secondary | ICD-10-CM | POA: Diagnosis not present

## 2017-08-18 LAB — POCT URINALYSIS DIPSTICK
Blood, UA: NEGATIVE
GLUCOSE UA: NEGATIVE
Ketones, UA: NEGATIVE
Leukocytes, UA: NEGATIVE
Nitrite, UA: NEGATIVE
Protein, UA: NEGATIVE

## 2017-08-18 NOTE — Progress Notes (Signed)
   LOW-RISK PREGNANCY VISIT Patient name: Patricia Richards MRN [id number]  Date of birth: 1991-01-10 Chief Complaint:   Routine Prenatal Visit  History of Present Illness:   Patricia Richards is a 27 y.o. [id number] female at [redacted]w[redacted]d with an Estimated Date of Delivery: 08/24/17 being seen today for ongoing management of a low-risk pregnancy.  Today she reports no complaints. Wants membranes swept Contractions: Regular.  .  Movement: Present. denies leaking of fluid. Review of Systems:   Pertinent items are noted in HPI Denies abnormal vaginal discharge w/ itching/odor/irritation, headaches, visual changes, shortness of breath, chest pain, abdominal pain, severe nausea/vomiting, or problems with urination or bowel movements unless otherwise stated above. Pertinent History Reviewed:  Reviewed past medical,surgical, social, obstetrical and family history.  Reviewed problem list, medications and allergies. Physical Assessment:   Vitals:   08/18/17 1620  BP: 120/70  Pulse: 93  Weight: 160 lb 9.6 oz (72.8 kg)  Body mass index is 27.57 kg/m.        Physical Examination:   General appearance: Well appearing, and in no distress  Mental status: Alert, oriented to person, place, and time  Skin: Warm & dry  Cardiovascular: Normal heart rate noted  Respiratory: Normal respiratory effort, no distress  Abdomen: Soft, gravid, nontender  Pelvic: Cervical exam performed  Offered membrane sweeping, discussed r/b- pt decided to proceed, so membranes swept.   Extremities: Edema: None  Fetal Status: Fetal Heart Rate (bpm): 140 Fundal Height: 39 cm Movement: Present Presentation: Vertex  Results for orders placed or performed in visit on 08/18/17 (from the past 24 hour(s))  POCT urinalysis dipstick   Collection Time: 08/18/17  4:23 PM  Result Value Ref Range   Color, UA     Clarity, UA     Glucose, UA neg    Bilirubin, UA     Ketones, UA neg    Spec Grav, UA  1.010 - 1.025   Blood, UA neg    pH, UA   5.0 - 8.0   Protein, UA neg    Urobilinogen, UA  0.2 or 1.0 E.U./dL   Nitrite, UA neg    Leukocytes, UA Negative Negative   Appearance     Odor      Assessment & Plan:  1) Low-risk pregnancy [id number] at [redacted]w[redacted]d with an Estimated Date of Delivery: 08/24/17    Meds: No orders of the defined types were placed in this encounter.  Labs/procedures today: sve, membrane sweeping  Plan:  Continue routine obstetrical care   Reviewed: Term labor symptoms and general obstetric precautions including but not limited to vaginal bleeding, contractions, leaking of fluid and fetal movement were reviewed in detail with the patient.  All questions were answered  Follow-up: Return in about 6 days (around 08/24/2017) for [place].  Orders Placed This Encounter  Procedures  . POCT urinalysis dipstick   Roma Schanz CNM, [facility] 08/18/2017 5:01 PM

## 2017-08-18 NOTE — Patient Instructions (Signed)
Patricia Richards, I greatly value your feedback.  If you receive a survey following your visit with [place] today, we appreciate you taking the time to fill it out.  Thanks, Knute Neu, CNM, WHNP-BC   Call the office [phone]) or go to [facility] if:  You begin to have strong, frequent contractions  Your water breaks.  Sometimes it is a big gush of fluid, sometimes it is just a trickle that keeps getting your panties wet or running down your legs  You have vaginal bleeding.  It is normal to have a small amount of spotting if your cervix was checked.   You don't feel your baby moving like normal.  If you don't, get you something to eat and drink and lay down and focus on feeling your baby move.  You should feel at least 10 movements in 2 hours.  If you don't, you should call the office or go to [place] Contractions Contractions of the uterus can occur throughout pregnancy, but they are not always a sign that you are in labor. You may have practice contractions called Braxton Hicks contractions. These false labor contractions are sometimes confused with true labor. What are [place] contractions? Braxton Hicks contractions are tightening movements that occur in the muscles of the uterus before labor. Unlike true labor contractions, these contractions do not result in opening (dilation) and thinning of the cervix. Toward the end of pregnancy (32-34 weeks), Braxton Hicks contractions can happen more often and may become stronger. These contractions are sometimes difficult to tell apart from true labor because they can be very uncomfortable. You should not feel embarrassed if you go to the hospital with false labor. Sometimes, the only way to tell if you are in true labor is for your health care provider to look for changes in the cervix. The health care provider will do a physical exam and may monitor your contractions. If you are not in true labor, the exam should  show that your cervix is not dilating and your water has not broken. If there are other health problems associated with your pregnancy, it is completely safe for you to be sent home with false labor. You may continue to have Braxton Hicks contractions until you go into true labor. How to tell the difference between true labor and false labor True labor  Contractions last 30-70 seconds.  Contractions become very regular.  Discomfort is usually felt in the top of the uterus, and it spreads to the lower abdomen and low back.  Contractions do not go away with walking.  Contractions usually become more intense and increase in frequency.  The cervix dilates and gets thinner. False labor  Contractions are usually shorter and not as strong as true labor contractions.  Contractions are usually irregular.  Contractions are often felt in the front of the lower abdomen and in the groin.  Contractions may go away when you walk around or change positions while lying down.  Contractions get weaker and are shorter-lasting as time goes on.  The cervix usually does not dilate or become thin. Follow these instructions at home:  Take over-the-counter and prescription medicines only as told by your health care provider.  Keep up with your usual exercises and follow other instructions from your health care provider.  Eat and drink lightly if you think you are going into labor.  If Braxton Hicks contractions are making you uncomfortable: ? Change your position from lying  down or resting to walking, or change from walking to resting. ? Sit and rest in a tub of warm water. ? Drink enough fluid to keep your urine pale yellow. Dehydration may cause these contractions. ? Do slow and deep breathing several times an hour.  Keep all follow-up prenatal visits as told by your health care provider. This is important. Contact a health care provider if:  You have a fever.  You have continuous pain in  your abdomen. Get help right away if:  Your contractions become stronger, more regular, and closer together.  You have fluid leaking or gushing from your vagina.  You pass blood-tinged mucus (bloody show).  You have bleeding from your vagina.  You have low back pain that you never had before.  You feel your baby's head pushing down and causing pelvic pressure.  Your baby is not moving inside you as much as it used to. Summary  Contractions that occur before labor are called Braxton Hicks contractions, false labor, or practice contractions.  Braxton Hicks contractions are usually shorter, weaker, farther apart, and less regular than true labor contractions. True labor contractions usually become progressively stronger and regular and they become more frequent.  Manage discomfort from [facility] contractions by changing position, resting in a warm bath, drinking plenty of water, or practicing deep breathing. This information is not intended to replace advice given to you by your health care provider. Make sure you discuss any questions you have with your health care provider. Document Released: 09/16/2016 Document Revised: 09/16/2016 Document Reviewed: 09/16/2016 Elsevier Interactive Patient Education  2018 [employer].

## 2017-08-24 ENCOUNTER — Ambulatory Visit (INDEPENDENT_AMBULATORY_CARE_PROVIDER_SITE_OTHER): Payer: Medicaid Other | Admitting: Advanced Practice Midwife

## 2017-08-24 VITALS — BP 118/68 | HR 90 | Wt 160.0 lb

## 2017-08-24 DIAGNOSIS — Z3A40 40 weeks gestation of pregnancy: Secondary | ICD-10-CM | POA: Diagnosis not present

## 2017-08-24 DIAGNOSIS — O368130 Decreased fetal movements, third trimester, not applicable or unspecified: Secondary | ICD-10-CM

## 2017-08-24 DIAGNOSIS — O480 Post-term pregnancy: Secondary | ICD-10-CM | POA: Diagnosis not present

## 2017-08-24 DIAGNOSIS — Z331 Pregnant state, incidental: Secondary | ICD-10-CM | POA: Diagnosis not present

## 2017-08-24 DIAGNOSIS — Z1389 Encounter for screening for other disorder: Secondary | ICD-10-CM

## 2017-08-24 DIAGNOSIS — Z3483 Encounter for supervision of other normal pregnancy, third trimester: Secondary | ICD-10-CM

## 2017-08-24 LAB — POCT URINALYSIS DIPSTICK
Blood, UA: NEGATIVE
Glucose, UA: NEGATIVE
Ketones, UA: NEGATIVE
Leukocytes, UA: NEGATIVE
Nitrite, UA: NEGATIVE
PROTEIN UA: NEGATIVE

## 2017-08-24 NOTE — Progress Notes (Signed)
  [id number] [redacted]w[redacted]d Estimated Date of Delivery: 08/24/17  Blood pressure 118/68, pulse 90, weight 160 lb (72.6 kg), last menstrual period 11/17/2016, not currently breastfeeding.   BP weight and urine results all reviewed and noted.  Please refer to the obstetrical flow sheet for the fundal height and fetal heart rate documentation:  Patient reports good fetal movement, denies any bleeding and no rupture of membranes symptoms or regular contractions. Patient is without complaints. All questions were answered.   Physical Assessment:   Vitals:   08/24/17 1519  BP: 118/68  Pulse: 90  Weight: 160 lb (72.6 kg)  Body mass index is 27.46 kg/m.        Physical Examination:   General appearance: Well appearing, and in no distress  Mental status: Alert, oriented to person, place, and time  Skin: Warm & dry  Cardiovascular: Normal heart rate noted  Respiratory: Normal respiratory effort, no distress  Abdomen: Soft, gravid, nontender  Pelvic: Cervical exam performed         Extremities: Edema: None  Fetal Status:     Movement: (!) Decreased    Results for orders placed or performed in visit on 08/24/17 (from the past 24 hour(s))  POCT Urinalysis Dipstick   Collection Time: 08/24/17  3:23 PM  Result Value Ref Range   Color, UA     Clarity, UA     Glucose, UA neg    Bilirubin, UA     Ketones, UA neg    Spec Grav, UA  1.010 - 1.025   Blood, UA neg    pH, UA  5.0 - 8.0   Protein, UA neg    Urobilinogen, UA  0.2 or 1.0 E.U./dL   Nitrite, UA neg    Leukocytes, UA Negative Negative   Appearance     Odor       Orders Placed This Encounter  Procedures  . US FETAL BPP WO NON STRESS  . POCT Urinalysis Dipstick    Plan:  Continued routine obstetrical care,   Return for friday for BPP, MOnday for NST, schedule IOL if undelivered.

## 2017-08-26 ENCOUNTER — Telehealth: Payer: Self-pay | Admitting: Obstetrics and Gynecology

## 2017-08-26 ENCOUNTER — Inpatient Hospital Stay (HOSPITAL_COMMUNITY)
Admission: AD | Admit: 2017-08-26 | Discharge: 2017-08-28 | DRG: 806 | Disposition: A | Discharge status: Home / Self Care | Payer: Medicaid Other | Source: Ambulatory Visit | Attending: Obstetrics and Gynecology | Admitting: Obstetrics and Gynecology

## 2017-08-26 ENCOUNTER — Other Ambulatory Visit: Payer: Self-pay

## 2017-08-26 ENCOUNTER — Inpatient Hospital Stay (HOSPITAL_COMMUNITY): Payer: Medicaid Other | Admitting: Anesthesiology

## 2017-08-26 ENCOUNTER — Ambulatory Visit (INDEPENDENT_AMBULATORY_CARE_PROVIDER_SITE_OTHER): Payer: Medicaid Other

## 2017-08-26 DIAGNOSIS — O480 Post-term pregnancy: Secondary | ICD-10-CM | POA: Diagnosis not present

## 2017-08-26 DIAGNOSIS — F1010 Alcohol abuse, uncomplicated: Secondary | ICD-10-CM

## 2017-08-26 DIAGNOSIS — O9832 Other infections with a predominantly sexual mode of transmission complicating childbirth: Secondary | ICD-10-CM | POA: Diagnosis present

## 2017-08-26 DIAGNOSIS — Z87891 Personal history of nicotine dependence: Secondary | ICD-10-CM | POA: Diagnosis not present

## 2017-08-26 DIAGNOSIS — Z79899 Other long term (current) drug therapy: Secondary | ICD-10-CM

## 2017-08-26 DIAGNOSIS — O368130 Decreased fetal movements, third trimester, not applicable or unspecified: Principal | ICD-10-CM | POA: Diagnosis present

## 2017-08-26 DIAGNOSIS — Z3403 Encounter for supervision of normal first pregnancy, third trimester: Secondary | ICD-10-CM

## 2017-08-26 DIAGNOSIS — Z87898 Personal history of other specified conditions: Secondary | ICD-10-CM

## 2017-08-26 DIAGNOSIS — F419 Anxiety disorder, unspecified: Secondary | ICD-10-CM | POA: Diagnosis present

## 2017-08-26 DIAGNOSIS — F1991 Other psychoactive substance use, unspecified, in remission: Secondary | ICD-10-CM

## 2017-08-26 DIAGNOSIS — O99313 Alcohol use complicating pregnancy, third trimester: Secondary | ICD-10-CM

## 2017-08-26 DIAGNOSIS — A6000 Herpesviral infection of urogenital system, unspecified: Secondary | ICD-10-CM | POA: Diagnosis present

## 2017-08-26 DIAGNOSIS — F329 Major depressive disorder, single episode, unspecified: Secondary | ICD-10-CM | POA: Diagnosis present

## 2017-08-26 DIAGNOSIS — O99344 Other mental disorders complicating childbirth: Secondary | ICD-10-CM | POA: Diagnosis present

## 2017-08-26 DIAGNOSIS — Z3A40 40 weeks gestation of pregnancy: Secondary | ICD-10-CM

## 2017-08-26 DIAGNOSIS — O368190 Decreased fetal movements, unspecified trimester, not applicable or unspecified: Secondary | ICD-10-CM | POA: Diagnosis present

## 2017-08-26 LAB — CBC
HEMATOCRIT: 30.7 % — AB (ref 36.0–46.0)
HEMOGLOBIN: 10 g/dL — AB (ref 12.0–15.0)
MCH: 28.3 pg (ref 26.0–34.0)
MCHC: 32.6 g/dL (ref 30.0–36.0)
MCV: 87 fL (ref 78.0–100.0)
Platelets: 226 10*3/uL (ref 150–400)
RBC: 3.53 MIL/uL — ABNORMAL LOW (ref 3.87–5.11)
RDW: 15.2 % (ref 11.5–15.5)
WBC: 10.8 10*3/uL — ABNORMAL HIGH (ref 4.0–10.5)

## 2017-08-26 LAB — RAPID URINE DRUG SCREEN, HOSP PERFORMED
Amphetamines: NOT DETECTED
Barbiturates: NOT DETECTED
Benzodiazepines: NOT DETECTED
COCAINE: NOT DETECTED
OPIATES: NOT DETECTED
TETRAHYDROCANNABINOL: NOT DETECTED

## 2017-08-26 MED ORDER — CLONAZEPAM 0.5 MG PO TABS
1.0000 mg | ORAL_TABLET | Freq: Three times a day (TID) | ORAL | Status: DC
  Administered 2017-08-26: 1 mg via ORAL
  Filled 2017-08-26: qty 2

## 2017-08-26 MED ORDER — OXYTOCIN BOLUS FROM INFUSION
500.0000 mL | Freq: Once | INTRAVENOUS | Status: AC
  Administered 2017-08-26: 500 mL via INTRAVENOUS

## 2017-08-26 MED ORDER — PHENYLEPHRINE 40 MCG/ML (10ML) SYRINGE FOR IV PUSH (FOR BLOOD PRESSURE SUPPORT): 80.0000 ug | PREFILLED_SYRINGE | INTRAVENOUS | Status: DC | PRN

## 2017-08-26 MED ORDER — LIDOCAINE HCL (PF) 1 % IJ SOLN
INTRAMUSCULAR | Status: DC | PRN
  Administered 2017-08-26: 4 mL via EPIDURAL

## 2017-08-26 MED ORDER — ACETAMINOPHEN 325 MG PO TABS: 650.0000 mg | ORAL_TABLET | ORAL | Status: DC | PRN

## 2017-08-26 MED ORDER — PHENYLEPHRINE 40 MCG/ML (10ML) SYRINGE FOR IV PUSH (FOR BLOOD PRESSURE SUPPORT)
80.0000 ug | PREFILLED_SYRINGE | INTRAVENOUS | Status: DC | PRN
  Filled 2017-08-26: qty 5
  Filled 2017-08-26: qty 10

## 2017-08-26 MED ORDER — FENTANYL CITRATE (PF) 100 MCG/2ML IJ SOLN: 100.0000 ug | INTRAMUSCULAR | Status: DC | PRN

## 2017-08-26 MED ORDER — LACTATED RINGERS IV SOLN
INTRAVENOUS | Status: DC
  Administered 2017-08-26: 20:00:00 via INTRAVENOUS

## 2017-08-26 MED ORDER — LIDOCAINE HCL (PF) 1 % IJ SOLN
30.0000 mL | INTRAMUSCULAR | Status: DC | PRN
  Filled 2017-08-26: qty 30

## 2017-08-26 MED ORDER — PHENYLEPHRINE 40 MCG/ML (10ML) SYRINGE FOR IV PUSH (FOR BLOOD PRESSURE SUPPORT)
80.0000 ug | PREFILLED_SYRINGE | INTRAVENOUS | Status: DC | PRN
  Filled 2017-08-26: qty 5

## 2017-08-26 MED ORDER — DIPHENHYDRAMINE HCL 50 MG/ML IJ SOLN: 12.5000 mg | INTRAMUSCULAR | Status: DC | PRN

## 2017-08-26 MED ORDER — EPHEDRINE 5 MG/ML INJ
10.0000 mg | INTRAVENOUS | Status: DC | PRN
  Filled 2017-08-26: qty 2

## 2017-08-26 MED ORDER — TERBUTALINE SULFATE 1 MG/ML IJ SOLN
0.2500 mg | Freq: Once | INTRAMUSCULAR | Status: DC | PRN
  Filled 2017-08-26: qty 1

## 2017-08-26 MED ORDER — OXYTOCIN 40 UNITS IN LACTATED RINGERS INFUSION - SIMPLE MED
1.0000 m[IU]/min | INTRAVENOUS | Status: DC
  Administered 2017-08-26: 2 m[IU]/min via INTRAVENOUS
  Filled 2017-08-26: qty 1000

## 2017-08-26 MED ORDER — OXYTOCIN 40 UNITS IN LACTATED RINGERS INFUSION - SIMPLE MED: 2.5000 [IU]/h | INTRAVENOUS | Status: DC

## 2017-08-26 MED ORDER — SOD CITRATE-CITRIC ACID 500-334 MG/5ML PO SOLN: 30.0000 mL | ORAL | Status: DC | PRN

## 2017-08-26 MED ORDER — ONDANSETRON HCL 4 MG/2ML IJ SOLN: 4.0000 mg | Freq: Four times a day (QID) | INTRAMUSCULAR | Status: DC | PRN

## 2017-08-26 MED ORDER — LACTATED RINGERS IV SOLN: 500.0000 mL | INTRAVENOUS | Status: DC | PRN

## 2017-08-26 MED ORDER — EPHEDRINE 5 MG/ML INJ: 10.0000 mg | INTRAVENOUS | Status: DC | PRN

## 2017-08-26 MED ORDER — LACTATED RINGERS IV SOLN
500.0000 mL | Freq: Once | INTRAVENOUS | Status: AC
  Administered 2017-08-26: 500 mL via INTRAVENOUS

## 2017-08-26 MED ORDER — LACTATED RINGERS IV SOLN: 500.0000 mL | Freq: Once | INTRAVENOUS | Status: DC

## 2017-08-26 MED ORDER — FENTANYL 2.5 MCG/ML BUPIVACAINE 1/10 % EPIDURAL INFUSION (WH - ANES)
14.0000 mL/h | INTRAMUSCULAR | Status: DC | PRN
  Administered 2017-08-26: 14 mL/h via EPIDURAL
  Filled 2017-08-26: qty 100

## 2017-08-26 NOTE — Progress Notes (Signed)
[place] 48+5 wks,cephalic,fhr 927 bpm,bilat adnexa's wnl,BPP 4/8 (no movement or tone),fundal pl gr 3,afi 14 cm,results discussed w/Dr. Glo Herring

## 2017-08-26 NOTE — Anesthesia Procedure Notes (Signed)
Epidural Patient location during procedure: OB Start time: 08/26/2017 9:27 PM End time: 08/26/2017 9:31 PM  Staffing Anesthesiologist: Effie Berkshire, MD Performed: anesthesiologist   Preanesthetic Checklist Completed: patient identified, site marked, surgical consent, pre-op evaluation, timeout performed, IV checked, risks and benefits discussed and monitors and equipment checked  Epidural Patient position: sitting Prep: ChloraPrep Patient monitoring: heart rate, continuous pulse ox and blood pressure Approach: midline Location: L3-L4 Injection technique: LOR saline  Needle:  Needle type: Tuohy  Needle gauge: 17 G Needle length: 9 cm Catheter type: closed end flexible Catheter size: 20 Guage Test dose: negative and 1.5% lidocaine  Assessment Events: blood not aspirated, injection not painful, no injection resistance and no paresthesia  Additional Notes LOR @ 4  Patient identified. Risks/Benefits/Options discussed with patient including but not limited to bleeding, infection, nerve damage, paralysis, failed block, incomplete pain control, headache, blood pressure changes, nausea, vomiting, reactions to medications, itching and postpartum back pain. Confirmed with bedside nurse the patient's most recent platelet count. Confirmed with patient that they are not currently taking any anticoagulation, have any bleeding history or any family history of bleeding disorders. Patient expressed understanding and wished to proceed. All questions were answered. Sterile technique was used throughout the entire procedure. Please see nursing notes for vital signs. Test dose was given through epidural catheter and negative prior to continuing to dose epidural or start infusion. Warning signs of high block given to the patient including shortness of breath, tingling/numbness in hands, complete motor block, or any concerning symptoms with instructions to call for help. Patient was given instructions on  fall risk and not to get out of bed. All questions and concerns addressed with instructions to call with any issues or inadequate analgesia.    Reason for block:procedure for pain

## 2017-08-26 NOTE — Anesthesia Pain Management Evaluation Note (Signed)
  CRNA Pain Management Visit Note  Patient: Patricia Richards, 27 y.o., female  "Hello I am a member of the anesthesia team at [facility]. We have an anesthesia team available at all times to provide care throughout the hospital, including epidural management and anesthesia for C-section. I don't know your plan for the delivery whether it a natural birth, water birth, IV sedation, nitrous supplementation, doula or epidural, but we want to meet your pain goals."   1.Was your pain managed to your expectations on prior hospitalizations?   Yes   2.What is your expectation for pain management during this hospitalization?     Labor support without medications  3.How can we help you reach that goal? Nursing interventions.  Record the patient's initial score and the patient's pain goal.   Pain: 5  Pain Goal: 10 [facility] wants you to be able to say your pain was always managed very well.  Patricia Richards 08/26/2017

## 2017-08-26 NOTE — Telephone Encounter (Signed)
Patricia Richards is a 27 year old multipara female at [redacted]w[redacted]d presents for her initial postdates testing after pregnancy followed at [facility].  She mentions decreased fetal movement and has not felt any fetal movement in 24 hours.  She states that this been previous episodes where she did not feel the baby move for period of time and then it moved so she did not make much concerned of it\  Ultrasound was done today and BPP is 4 out of 8 with no fetal tone or breathing movements.  Given this patient's fetal well-being cannot be assured she will be direct admission for induction of labor. Patient will be traveling by personal vehicle to [facility] is familiar with how to get there.  Labor and delivery has been notified. I have spoken with Derrill Memo certified nurse midwife on today..  Review of records indicates that the patient has been on Klonopin which may be contributory to the decreased fetal movement she has been reliable with prenatal care during the pregnancy and urine drug screens have been negative x3 during the pregnancy, however most recently March 22 urine drug screen was POSITIVE for opiates Group B strep is negative as is GC and chlamydia.   She she was 2.5 cm dilated on Wednesday of this week. Will admit for induction of labor

## 2017-08-26 NOTE — Anesthesia Preprocedure Evaluation (Signed)
Anesthesia Evaluation  Patient identified by MRN, date of birth, ID band Patient awake    Reviewed: Allergy & Precautions, Patient's Chart, lab work & pertinent test results  Airway Mallampati: I       Dental no notable dental hx.    Pulmonary former smoker,    Pulmonary exam normal        Cardiovascular Normal cardiovascular exam     Neuro/Psych PSYCHIATRIC DISORDERS Anxiety Depression Bipolar Disorder    GI/Hepatic Neg liver ROS,   Endo/Other  negative endocrine ROS  Renal/GU      Musculoskeletal   Abdominal   Peds  Hematology negative hematology ROS (+)   Anesthesia Other Findings   Reproductive/Obstetrics (+) Pregnancy                             Anesthesia Physical Anesthesia Plan  ASA: II  Anesthesia Plan: Epidural   Post-op Pain Management:    Induction:   PONV Risk Score and Plan:   Airway Management Planned:   Additional Equipment:   Intra-op Plan:   Post-operative Plan:   Informed Consent: I have reviewed the patients History and Physical, chart, labs and discussed the procedure including the risks, benefits and alternatives for the proposed anesthesia with the patient or authorized representative who has indicated his/her understanding and acceptance.     Plan Discussed with:   Anesthesia Plan Comments: (Lab Results      Component                Value               Date                      WBC                      10.8 (H)            08/26/2017                HGB                      10.0 (L)            08/26/2017                HCT                      30.7 (L)            08/26/2017                MCV                      87.0                08/26/2017                PLT                      226                 08/26/2017           )        Anesthesia Quick Evaluation

## 2017-08-26 NOTE — H&P (Addendum)
OBSTETRIC ADMISSION HISTORY AND PHYSICAL  Patricia Richards is a 27 y.o. female [phone] with IUP at [redacted]w[redacted]d by LMP presenting for IOL for poor fetal movement and BPP 4/8 . She reports  No LOF, no VB, no blurry vision, headaches or peripheral edema, and RUQ pain. Patient is an admission for an IOL for poor stress test and 4/8 BPP.   She plans on breast feeding. She request nuva ring for birth control. Does not want circumcision She received her prenatal care at [facility]   Dating: By LMP --->  Estimated Date of Delivery: 08/24/17  Sono:    @17w *5d, CWD, normal anatomy, cephalic presentation   [place]  Initiated Care at  [facility]  Dating By LMP c/w 1st trimester U/S 7wk  Pap 03/03/17: neg  GC/CT Initial:  -/-              36+wks:  Genetic Screen NT/IT: neg  CF screen declined  Anatomic [place] Normal female  Flu vaccine Declined 03/03/17   Tdap Recommended ~ 28wks  Glucose Screen  2 hr  80/124/91  GBS negative  Feed Preference bottle  Contraception nuva ring  Circumcision no  Childbirth Classes declined  Pediatrician List given     Prenatal History/Complications:  Past Medical History: Past Medical History:  Diagnosis Date  . 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 Anemia   . Bacterial infection   . Bipolar 1 disorder ([place])   . Depression    on meds now, doing well  . Herpes   . Infection    UTI  . Kidney stones   . 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 Panic attacks   . Pregnant   . Urinary tract infection    gets them frequently  . Vaginal Pap smear, abnormal     Past Surgical History: Past Surgical History:  Procedure Laterality Date  . DILATION AND EVACUATION  11/28/2010    Obstetrical History: OB History    Gravida  5   Para  3   Term  3   Preterm  0   AB  1   Living  3     SAB  0   TAB  1   Ectopic  0   Multiple  0   Live Births   3           Social History: Social History   Socioeconomic History  . Marital status: Married    Spouse name: Not on file  . Number of children: Not on file  . Years of education: Not on file  . Highest education level: Not on file  Occupational History  . Not on file  Social Needs  . Financial resource strain: Not on file  . Food insecurity:    Worry: Not on file    Inability: Not on file  . Transportation needs:    Medical: Not on file    Non-medical: Not on file  Tobacco Use  . Smoking status: Former Smoker    Types: Cigarettes    Last attempt to quit: 02/14/2014    Years since quitting: 3.5  . Smokeless tobacco: Never Used  Substance and Sexual Activity  . Alcohol use: No    Alcohol/week: 0.0 oz    Comment: occasionally   . Drug use: No    Types: Other-see comments, Marijuana, Cocaine  Comment: none since march 2018  . Sexual activity: Yes    Birth control/protection: None  Lifestyle  . Physical activity:    Days per week: Not on file    Minutes per session: Not on file  . Stress: Not on file  Relationships  . Social connections:    Talks on phone: Not on file    Gets together: Not on file    Attends religious service: Not on file    Active member of club or organization: Not on file    Attends meetings of clubs or organizations: Not on file    Relationship status: Not on file  Other Topics Concern  . Not on file  Social History Narrative  . Not on file    Family History: Family History  Problem Relation Age of Onset  . Hypertension Father   . Cancer Father        skin  . Mental illness Father   . Cancer Maternal Grandmother   . Mental illness Maternal Grandmother   . Cerebral palsy Mother   . Other Brother        problems with gums  . Heart attack Paternal Grandmother   . Down syndrome Cousin   . Bipolar disorder Cousin     Allergies: Allergies  Allergen Reactions  . Metronidazole Shortness Of Breath    Pt states was her anxiety,  not really sob/anaphylaxis  . Tramadol Other (See Comments)    "CAUSES REALLY BAD MOOD SWINGS AND HALLUCINATIONS"    Medications Prior to Admission  Medication Sig Dispense Refill Last Dose  . acetaminophen (TYLENOL) 500 MG tablet Take 500 mg by mouth every 6 (six) hours as needed for mild pain or moderate pain.   08/25/2017 at Unknown time  . clonazePAM (KLONOPIN) 1 MG tablet Take 1 tablet (1 mg total) by mouth 3 (three) times daily. 90 tablet 3 08/25/2017 at Unknown time  . valACYclovir (VALTREX) 500 MG tablet Take 500 mg by mouth 2 (two) times daily.   Past Week at Unknown time  . pantoprazole (PROTONIX) 20 MG tablet Take 1 tablet (20 mg total) by mouth daily. (Patient not taking: Reported on 08/26/2017) 30 tablet 6 Not Taking at Unknown time     Review of Systems   All systems reviewed and negative except as stated in HPI  Blood pressure 113/70, pulse 96, height 5\' 3"  (1.6 m), weight 72.6 kg (160 lb), last menstrual period 11/17/2016, not currently breastfeeding. General appearance: alert and cooperative Lungs: clear to auscultation bilaterally Heart: regular rate and rhythm Abdomen: soft, non-tender; bowel sounds normal Extremities: Homans sign is negative, no sign of DVT DTR's normal Presentation: cephalic Fetal monitoringBaseline: 140 bpm, Variability: Good {> 6 bpm), Accelerations: Non-reactive but appropriate for gestational age and Decelerations: Absent Uterine activityFrequency: Every 3 minutes Dilation: 2.5 Effacement (%): 50 Station: -2 Exam by:: m wilkins rnc   Prenatal labs: ABO, Rh: A/Negative/-- (09/13 1605) Antibody: Negative (01/17 0918) Rubella: 3.39 (09/13 1605) RPR: Non Reactive (01/17 0918)  HBsAg: Negative (09/13 1605)  HIV: Non Reactive (01/17 0918)  GBS: Negative (03/15 1300)  2 hour 80/124/91 Genetic screening  normal Anatomy US normal  Prenatal Transfer Tool  Maternal Diabetes: No Genetic Screening: Normal Maternal Ultrasounds/Referrals:  Normal Fetal Ultrasounds or other Referrals:  None Maternal Substance Abuse:  Urine positive for opiates 3/22; neg this admission Significant Maternal Medications:  Klonopin, valtrex Significant Maternal Lab Results: Lab values include: Group B Strep negative  Results for orders placed or performed  during the hospital encounter of 08/26/17 (from the past 24 hour(s))  Urine rapid drug screen (hosp performed)   Collection Time: 08/26/17  3:45 PM  Result Value Ref Range   Opiates NONE DETECTED NONE DETECTED   Cocaine NONE DETECTED NONE DETECTED   Benzodiazepines NONE DETECTED NONE DETECTED   Amphetamines NONE DETECTED NONE DETECTED   Tetrahydrocannabinol NONE DETECTED NONE DETECTED   Barbiturates NONE DETECTED NONE DETECTED  CBC   Collection Time: 08/26/17  4:04 PM  Result Value Ref Range   WBC 10.8 (H) 4.0 - 10.5 K/uL   RBC 3.53 (L) 3.87 - 5.11 MIL/uL   Hemoglobin 10.0 (L) 12.0 - 15.0 g/dL   HCT 30.7 (L) 36.0 - 46.0 %   MCV 87.0 78.0 - 100.0 fL   MCH 28.3 26.0 - 34.0 pg   MCHC 32.6 30.0 - 36.0 g/dL   RDW 15.2 11.5 - 15.5 %   Platelets 226 150 - 400 K/uL    Patient Active Problem List   Diagnosis Date Noted  . Decreased fetal movement 08/26/2017  . Supervision of normal pregnancy 01/27/2017  . History of illicit drug use 29/52/8413  . 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  . Abnormal Pap smear of cervix 05/20/2015  . Depression with anxiety 05/13/2015  . Hallucinations 05/13/2015  . History of suicidal ideation 05/13/2015  . History of alcohol abuse 03/18/2015  . Rh negative state in antepartum period 08/28/2013    Assessment/Plan:  Patricia Richards is a 27 y.o. [phone] at [redacted]w[redacted]d here for IOL for poor heart tones and BPP 4/8 at clinic. Patient with history of substance abuse but negative uds. Did test positive for opiates at one point during third trimester. Will induce with pit. Social work after delivery.  #Labor: inducing with pitocin #Pain: IV pain  meds #FWB: Cat II #ID:  N/A #MOF: breast #MOC: nuva ring #Circ:  Does not want  Guadalupe Dawn, MD  08/26/2017, 4:41 PM   CNM attestation:  I have seen and examined this patient; I agree with above documentation in the resident's note.   Patricia Richards is a 27 y.o. [phone] here for IOL for decreased FM and BPP 4/8  PE: BP 107/70 (BP Location: Left Arm)   Pulse 89   Temp 98.4 F (36.9 C)   Resp 18   Ht 5\' 3"  (1.6 m)   Wt 72.6 kg (160 lb)   LMP 11/17/2016   BMI 28.34 kg/m  Gen: calm comfortable, NAD Resp: normal effort, no distress Abd: gravid  ROS, labs, PMH reviewed  Plan: Admit to [facility] as induction method due to cx favorability Anticipate SVD Plan SW eval PP for hx drug use and social concerns (previoius homelessness)  Serita Grammes CNM 08/26/2017, 7:53 PM

## 2017-08-27 ENCOUNTER — Other Ambulatory Visit: Payer: Self-pay

## 2017-08-27 ENCOUNTER — Encounter (HOSPITAL_COMMUNITY): Payer: Self-pay

## 2017-08-27 LAB — RPR: RPR Ser Ql: NONREACTIVE

## 2017-08-27 MED ORDER — COCONUT OIL OIL: 1.0000 "application " | TOPICAL_OIL | Status: DC | PRN

## 2017-08-27 MED ORDER — PRENATAL MULTIVITAMIN CH
1.0000 | ORAL_TABLET | Freq: Every day | ORAL | Status: DC
  Administered 2017-08-27 – 2017-08-28 (×2): 1 via ORAL
  Filled 2017-08-27 (×2): qty 1

## 2017-08-27 MED ORDER — SENNOSIDES-DOCUSATE SODIUM 8.6-50 MG PO TABS
2.0000 | ORAL_TABLET | ORAL | Status: DC
  Administered 2017-08-27: 2 via ORAL
  Filled 2017-08-27: qty 2

## 2017-08-27 MED ORDER — DIBUCAINE 1 % RE OINT: 1.0000 "application " | TOPICAL_OINTMENT | RECTAL | Status: DC | PRN

## 2017-08-27 MED ORDER — SIMETHICONE 80 MG PO CHEW: 80.0000 mg | CHEWABLE_TABLET | ORAL | Status: DC | PRN

## 2017-08-27 MED ORDER — DIPHENHYDRAMINE HCL 25 MG PO CAPS: 25.0000 mg | ORAL_CAPSULE | Freq: Four times a day (QID) | ORAL | Status: DC | PRN

## 2017-08-27 MED ORDER — ZOLPIDEM TARTRATE 5 MG PO TABS: 5.0000 mg | ORAL_TABLET | Freq: Every evening | ORAL | Status: DC | PRN

## 2017-08-27 MED ORDER — ACETAMINOPHEN 325 MG PO TABS
650.0000 mg | ORAL_TABLET | ORAL | Status: DC | PRN
  Administered 2017-08-27 – 2017-08-28 (×5): 650 mg via ORAL
  Filled 2017-08-27 (×5): qty 2

## 2017-08-27 MED ORDER — IBUPROFEN 600 MG PO TABS
600.0000 mg | ORAL_TABLET | Freq: Four times a day (QID) | ORAL | Status: DC
  Administered 2017-08-27 – 2017-08-28 (×6): 600 mg via ORAL
  Filled 2017-08-27 (×6): qty 1

## 2017-08-27 MED ORDER — ONDANSETRON HCL 4 MG/2ML IJ SOLN: 4.0000 mg | INTRAMUSCULAR | Status: DC | PRN

## 2017-08-27 MED ORDER — CLONAZEPAM 0.5 MG PO TABS
1.0000 mg | ORAL_TABLET | Freq: Three times a day (TID) | ORAL | Status: DC
  Administered 2017-08-27 – 2017-08-28 (×3): 1 mg via ORAL
  Filled 2017-08-27 (×3): qty 2

## 2017-08-27 MED ORDER — BENZOCAINE-MENTHOL 20-0.5 % EX AERO
1.0000 "application " | INHALATION_SPRAY | CUTANEOUS | Status: DC | PRN
  Administered 2017-08-27 – 2017-08-28 (×2): 1 via TOPICAL
  Filled 2017-08-27 (×2): qty 56

## 2017-08-27 MED ORDER — WITCH HAZEL-GLYCERIN EX PADS: 1.0000 "application " | MEDICATED_PAD | CUTANEOUS | Status: DC | PRN

## 2017-08-27 MED ORDER — ONDANSETRON HCL 4 MG PO TABS
4.0000 mg | ORAL_TABLET | ORAL | Status: DC | PRN
Start: 2017-08-27 — End: 2017-08-28

## 2017-08-27 MED ORDER — TETANUS-DIPHTH-ACELL PERTUSSIS 5-2.5-18.5 LF-MCG/0.5 IM SUSP
0.5000 mL | Freq: Once | INTRAMUSCULAR | Status: AC
  Administered 2017-08-28: 0.5 mL via INTRAMUSCULAR
  Filled 2017-08-27: qty 0.5

## 2017-08-27 NOTE — Progress Notes (Addendum)
CLINICAL SOCIAL WORK MATERNAL/CHILD NOTE  Patient Details  Name: Patricia Richards MRN: [id number] Date of Birth: 08/26/2015  Date:  08/27/2017  Clinical Social Worker Initiating Note:  Madilyn Fireman, MSW, LCSW-A Date/Time: Initiated:  08/27/17/1325     Child's Name:  Patricia Richards   Biological Parents:  Mother, Father   Need for Interpreter:  None   Reason for Referral:  Current Substance Use/Substance Use During Pregnancy    Address:  [address] [place] [address]    Phone number:  [phone] (home)     Additional phone number:   Household Members/Support Persons (HM/SP):   Household Member/Support Person 1   HM/SP Name Relationship DOB or Age  HM/SP -1 Patricia Richards 04/02/1996    HM/SP -2        HM/SP -3        HM/SP -4        HM/SP -5        HM/SP -6        HM/SP -7        HM/SP -8          Natural Supports (not living in the home):  Immediate Family, Extended Family   Professional Supports:     Employment: Unemployed   Type of Work:     Education:  Programmer, systems   Homebound arranged:    Museum/gallery curator Resources:  Medicaid   Other Resources:      Cultural/Religious Considerations Which May Impact Care:  None  Strengths:  Ability to meet basic needs    Psychotropic Medications:   None      Pediatrician:     Did not assess  Pediatrician List:   [place]      Pediatrician Fax Number:    Risk Factors/Current Problems:      Cognitive State:  Able to Concentrate , Alert    Mood/Affect:   Agitated   CSW Assessment: CSW met with patient, husband, newborn, and maternal grandmother at bedside to discuss needs. CSW obtained permission from patient to discuss personal matters in front of her family members. Patient's husband is Patricia Richards (DOB 04/02/96). Patient reports having custody of all her children at this time, one is  currently at her grandfather's and the others are in the care of her grandmother. Patient's children are: Patricia Richards (DOB 05/28/10), Patricia Richards. (DOB 12/18/13), and Patricia Richards (DOB 11/21/15). Patient was positive for opiates on 08/05/17, but negative upon admission. Patient states she was treated for herpes outbreak in January at [facility] ED and was given hydrocodone for pain treatment. Patient reports taking a hydrocodone in January and once in March. Patient was prescribed this medication by Dr. Wyonia Hough. Patient's infant UDS is negative, awaiting cord results. CSW educated patient on drug screening policy, stated agreement. Patient with prior CPS involvement, but all cases have been closed at this time. Patient actively taking Klonopin for her anxiety and depression. Patient lives with her husband and child. CSW completed report to [place] due to prior CPS involvement and positive UDS for opiates in March 2018. Case was not accepted, intake worker asked CSW to return call if additional substances were found on cord tissue results. Patient reported having a crib for infant and also a new car seat, safe sleep and safe transportation techniques discussed. Patient did  not have further questions for CSW, CSW encouraged patient to reach out for assistance if needs arise.  CSW Plan/Description:  Psychosocial Support and Ongoing Assessment of Needs, Sudden Infant Death Syndrome (SIDS) Education, Perinatal Mood and Anxiety Disorder (PMADs) Education, Child Protective Service Report , CSW Will Continue to Monitor Umbilical Cord Tissue Drug Screen Results and Make Report if Earlie Counts, [place] 08/27/2017, 1:30 PM

## 2017-08-27 NOTE — Anesthesia Postprocedure Evaluation (Signed)
Anesthesia Post Note  Patient: Patricia Richards  Procedure(s) Performed: AN AD [place]     Patient location during evaluation: Mother Baby Anesthesia Type: Epidural Level of consciousness: awake and alert Pain management: pain level controlled Vital Signs Assessment: post-procedure vital signs reviewed and stable Respiratory status: spontaneous breathing Cardiovascular status: stable Postop Assessment: no headache, patient able to bend at knees, no backache, no apparent nausea or vomiting, epidural receding and adequate PO intake Anesthetic complications: no    Last Vitals:  Vitals:   08/27/17 0105 08/27/17 0500  BP: 110/67 105/69  Pulse: (!) 108 90  Resp:  16  Temp:  36.7 C  SpO2:  98%    Last Pain:  Vitals:   08/27/17 0500  TempSrc:   PainSc: 0-No pain   Pain Goal: Patients Stated Pain Goal: 2 (08/27/17 0130)               [name]

## 2017-08-27 NOTE — Progress Notes (Addendum)
Post Partum Day 1 Subjective: no complaints, up ad lib, voiding, tolerating PO and + flatus  Objective: Blood pressure 105/69, pulse 90, temperature 98.1 F (36.7 C), resp. rate 16, height 5\' 3"  (1.6 m), weight 72.6 kg (160 lb), last menstrual period 11/17/2016, SpO2 98 %, unknown if currently breastfeeding.  Physical Exam:  General: alert and cooperative Lochia: appropriate Uterine Fundus: firm Incision: NA DVT Evaluation: No evidence of DVT seen on physical exam. Negative Homan's sign.  Recent Labs    08/26/17 1604  HGB 10.0*  HCT 30.7*    Assessment/Plan: Plan for discharge tomorrow, Social Work consult and Contraception nuva ring   LOS: 1 day   Guadalupe Dawn 08/27/2017, 9:03 AM   CNM attestation Post Partum Day #1 I have seen and examined this patient and agree with above documentation in the resident's note.   Patricia Richards is a 27 y.o. U2G2542 s/p SVD.  Pt denies problems with ambulating, voiding or po intake. Pain is well controlled.  Plan for birth control is NuvaRing vaginal inserts.  Method of Feeding: bottle  PE:  BP 105/69 (BP Location: Left Arm)   Pulse 90   Temp 98.1 F (36.7 C)   Resp 16   Ht 5\' 3"  (1.6 m)   Wt 72.6 kg (160 lb)   LMP 11/17/2016   SpO2 98%   Breastfeeding? Unknown   BMI 28.34 kg/m  Fundus firm  Plan for discharge: 08/27/17 SW to see today 2/2 preg drug use and + opiates last month  Serita Grammes, CNM 9:12 AM 08/27/2017

## 2017-08-28 NOTE — Discharge Summary (Addendum)
OB Discharge Summary     Patient Name: Patricia Richards DOB: 12-08-1990 MRN: [id number]  Date of admission: 08/26/2017 Delivering MD: Serita Grammes D   Date of discharge: 08/28/2017  Admitting diagnosis: 40wks induction decrease fetal movement Intrauterine pregnancy: [redacted]w[redacted]d     Secondary diagnosis:  Active Problems:   Decreased fetal movement  Additional problems: HSV-2, depression/anxiety, h/o bipolar and SI, h/o illicit drug use     Discharge diagnosis: Term Pregnancy Delivered                                                                     Post partum procedures:None  Augmentation: Pitocin  Complications: None  Hospital course:  Induction of Labor With Vaginal Delivery   27 y.o. yo G3T5176 at [redacted]w[redacted]d was admitted to the hospital 08/26/2017 for induction of labor.  Indication for induction: decreased fetal movement and BPP 4/8.  Patient had an uncomplicated labor course as follows: Membrane Rupture Time/Date: 8:25 PM ,08/26/2017   Intrapartum Procedures: Episiotomy: None [1]                                         Lacerations:  None [1]  Patient had delivery of a Viable infant.  Information for the patient's newborn:  Darleen, Moffitt [[id number]]  Delivery Method: Vag-Spont   08/26/2017  Details of delivery can be found in separate delivery note.  Patient had a routine postpartum course. Patient is discharged home 08/28/17.  Physical exam  Vitals:   08/27/17 0105 08/27/17 0500 08/27/17 1858 08/28/17 0524  BP: 110/67 105/69 118/64 105/65  Pulse: (!) 108 90 100 75  Resp:  16 18 16   Temp:  98.1 F (36.7 C) 98.6 F (37 C) 97.8 F (36.6 C)  TempSrc:   Oral Oral  SpO2:  98%  98%  Weight:      Height:       General: alert and no distress Lochia: appropriate Uterine Fundus: firm Incision: N/A DVT Evaluation: No evidence of DVT seen on physical exam. Labs: Lab Results  Component Value Date   WBC 10.8 (H) 08/26/2017   HGB 10.0 (L) 08/26/2017   HCT  30.7 (L) 08/26/2017   MCV 87.0 08/26/2017   PLT 226 08/26/2017   CMP Latest Ref Rng & Units 04/20/2017  Glucose 65 - 99 mg/dL 82  BUN 6 - 20 mg/dL 9  Creatinine 0.44 - 1.00 mg/dL 0.44  Sodium 135 - 145 mmol/L 135  Potassium 3.5 - 5.1 mmol/L 3.2(L)  Chloride 101 - 111 mmol/L 104  CO2 22 - 32 mmol/L 23  Calcium 8.9 - 10.3 mg/dL 9.2  Total Protein 6.5 - 8.1 g/dL -  Total Bilirubin 0.3 - 1.2 mg/dL -  Alkaline Phos 38 - 126 U/L -  AST 15 - 41 U/L -  ALT 14 - 54 U/L -    Discharge instruction: per After Visit Summary and "Baby and Me Booklet".  After visit meds:  Allergies as of 08/28/2017      Reactions   Metronidazole Shortness Of Breath   Pt states was her anxiety, not really sob/anaphylaxis   Tramadol Other (See Comments)   "  CAUSES REALLY BAD MOOD SWINGS AND HALLUCINATIONS"      Medication List    STOP taking these medications   acetaminophen 500 MG tablet Commonly known as:  TYLENOL   clonazePAM 1 MG tablet Commonly known as:  KLONOPIN   pantoprazole 20 MG tablet Commonly known as:  PROTONIX   valACYclovir 500 MG tablet Commonly known as:  VALTREX       Diet: routine diet  Activity: Advance as tolerated. Pelvic rest for 6 weeks.   Outpatient follow up:6 weeks Follow up Appt: Future Appointments  Date Time Provider [place]  08/29/2017 11:00 AM Jonnie Kind, MD [facility]   Follow up Visit:No follow-ups on file.  Postpartum contraception: Nuvaring  Newborn Data: Live born female  Birth Weight: 8 lb 12.2 oz (3975 g) APGAR: 7, 9  Newborn Delivery   Birth date/time:  08/26/2017 22:07:00 Delivery type:  Vaginal, Spontaneous     Baby Feeding: Bottle Disposition:home with mother   08/28/2017 [place] Bing, DO  OB FELLOW DISCHARGE ATTESTATION  I have seen and examined this patient. I agree with above documentation and have made edits as needed.   Luiz Blare, DO OB Fellow 8:50 AM

## 2017-08-28 NOTE — Discharge Instructions (Signed)
Postpartum Care After Vaginal Delivery °The period of time right after you deliver your newborn is called the postpartum period. °What kind of medical care will I receive? °· You may continue to receive fluids and medicines through an IV tube inserted into one of your veins. °· If an incision was made near your vagina (episiotomy) or if you had some vaginal tearing during delivery, cold compresses may be placed on your episiotomy or your tear. This helps to reduce pain and swelling. °· You may be given a squirt bottle to use when you go to the bathroom. You may use this until you are comfortable wiping as usual. To use the squirt bottle, follow these steps: °? Before you urinate, fill the squirt bottle with warm water. Do not use hot water. °? After you urinate, while you are sitting on the toilet, use the squirt bottle to rinse the area around your urethra and vaginal opening. This rinses away any urine and blood. °? You may do this instead of wiping. As you start healing, you may use the squirt bottle before wiping yourself. Make sure to wipe gently. °? Fill the squirt bottle with clean water every time you use the bathroom. °· You will be given sanitary pads to wear. °How can I expect to feel? °· You may not feel the need to urinate for several hours after delivery. °· You will have some soreness and pain in your abdomen and vagina. °· If you are breastfeeding, you may have uterine contractions every time you breastfeed for up to several weeks postpartum. Uterine contractions help your uterus return to its normal size. °· It is normal to have vaginal bleeding (lochia) after delivery. The amount and appearance of lochia is often similar to a menstrual period in the first week after delivery. It will gradually decrease over the next few weeks to a dry, yellow-brown discharge. For most women, lochia stops completely by 6-8 weeks after delivery. Vaginal bleeding can vary from woman to woman. °· Within the first few  days after delivery, you may have breast engorgement. This is when your breasts feel heavy, full, and uncomfortable. Your breasts may also throb and feel hard, tightly stretched, warm, and tender. After this occurs, you may have milk leaking from your breasts. Your health care provider can help you relieve discomfort due to breast engorgement. Breast engorgement should go away within a few days. °· You may feel more sad or worried than normal due to hormonal changes after delivery. These feelings should not last more than a few days. If these feelings do not go away after several days, speak with your health care provider. °How should I care for myself? °· Tell your health care provider if you have pain or discomfort. °· Drink enough water to keep your urine clear or pale yellow. °· Wash your hands thoroughly with soap and water for at least 20 seconds after changing your sanitary pads, after using the toilet, and before holding or feeding your baby. °· If you are not breastfeeding, avoid touching your breasts a lot. Doing this can make your breasts produce more milk. °· If you become weak or lightheaded, or you feel like you might faint, ask for help before: °? Getting out of bed. °? Showering. °· Change your sanitary pads frequently. Watch for any changes in your flow, such as a sudden increase in volume, a change in color, the passing of large blood clots. If you pass a blood clot from your vagina, save it   to show to your health care provider. Do not flush blood clots down the toilet without having your health care provider look at them. °· Make sure that all your vaccinations are up to date. This can help protect you and your baby from getting certain diseases. You may need to have immunizations done before you leave the hospital. °· If desired, talk with your health care provider about methods of family planning or birth control (contraception). °How can I start bonding with my baby? °Spending as much time as  possible with your baby is very important. During this time, you and your baby can get to know each other and develop a bond. Having your baby stay with you in your room (rooming in) can give you time to get to know your baby. Rooming in can also help you become comfortable caring for your baby. Breastfeeding can also help you bond with your baby. °How can I plan for returning home with my baby? °· Make sure that you have a car seat installed in your vehicle. °? Your car seat should be checked by a certified car seat installer to make sure that it is installed safely. °? Make sure that your baby fits into the car seat safely. °· Ask your health care provider any questions you have about caring for yourself or your baby. Make sure that you are able to contact your health care provider with any questions after leaving the hospital. °This information is not intended to replace advice given to you by your health care provider. Make sure you discuss any questions you have with your health care provider. °Document Released: 02/28/2007 Document Revised: 10/06/2015 Document Reviewed: 04/07/2015 °Elsevier Interactive Patient Education © 2018 [employer] ° °

## 2017-08-28 NOTE — Progress Notes (Signed)
There are no barriers to discharge at this time.   Madilyn Fireman, MSW, [place] Social Worker [facility] [phone]

## 2017-08-29 ENCOUNTER — Other Ambulatory Visit: Payer: Medicaid Other | Admitting: Obstetrics and Gynecology

## 2017-08-29 NOTE — Progress Notes (Signed)
CSW spoke with intake worker at [facility]).  CPS was familiar with family and reported no barriers to infants d/c with [name].  CSW will continue to monitor infant's UDS and will make a report if results are positive without an explanation.   CSW updated CN.  Laurey Arrow, MSW, LCSW Clinical Social Work [phone]

## 2017-08-30 LAB — TYPE AND SCREEN
ABO/RH(D): A NEG
ANTIBODY SCREEN: POSITIVE
UNIT DIVISION: 0
Unit division: 0

## 2017-08-30 LAB — BPAM RBC
BLOOD PRODUCT EXPIRATION DATE: 201905022359
BLOOD PRODUCT EXPIRATION DATE: 201905072359
Unit Type and Rh: 9500
Unit Type and Rh: 9500

## 2017-09-05 ENCOUNTER — Telehealth: Payer: Self-pay | Admitting: Obstetrics & Gynecology

## 2017-09-05 ENCOUNTER — Telehealth: Payer: Self-pay | Admitting: *Deleted

## 2017-09-05 MED ORDER — CLONAZEPAM 1 MG PO TABS: 1.0000 mg | ORAL_TABLET | Freq: Three times a day (TID) | ORAL | 1 refills | Status: DC | PRN

## 2017-09-05 NOTE — Telephone Encounter (Signed)
Patient is requesting a refill on the Clonazepam for 1 month until she is able to get in with another therapist.   [facility] does not have a physician in that office that can refill it and she is almost out.  It will be 3 weeks before she can get in with another doctor.

## 2017-09-05 NOTE — Telephone Encounter (Signed)
Informed patient Dr Elonda Husky refilled Clonazepam for 1 month. Verbalized understanding.

## 2017-09-12 ENCOUNTER — Telehealth: Payer: Self-pay | Admitting: *Deleted

## 2017-09-12 NOTE — Telephone Encounter (Signed)
Pt called stating that she had a vaginal delivery 2 weeks ago and now she has had a break out to something. She states that she has been having sex with condom use. She also states that she took "Plan B". She states that her other son has an HSV outbreak on his mouth so she started taking her HSV medication again. She states that after she started that back she broke out in the vaginal area with blisters. Advised that she should make an appt to be evaluated by a provider. Pt verbalized understanding.

## 2017-09-14 ENCOUNTER — Encounter: Payer: Self-pay | Admitting: Obstetrics & Gynecology

## 2017-09-14 ENCOUNTER — Ambulatory Visit: Payer: Medicaid Other | Admitting: Women's Health

## 2017-10-04 ENCOUNTER — Ambulatory Visit: Payer: Medicaid Other | Admitting: Advanced Practice Midwife

## 2017-10-04 ENCOUNTER — Telehealth: Payer: Self-pay | Admitting: Obstetrics & Gynecology

## 2017-10-04 ENCOUNTER — Telehealth: Payer: Self-pay | Admitting: *Deleted

## 2017-10-04 NOTE — Telephone Encounter (Signed)
See note from Christ Kick

## 2017-10-04 NOTE — Telephone Encounter (Signed)
Called pt to rs pp appt that she missed today.  Pt informed me that she did not want to rs appt.  Pt said that she was seen at the health dept last week and had that visit and was given birth control.  10-04-17  AS

## 2017-11-10 ENCOUNTER — Other Ambulatory Visit: Payer: Self-pay | Admitting: Obstetrics & Gynecology

## 2017-11-11 ENCOUNTER — Telehealth: Payer: Self-pay | Admitting: Obstetrics & Gynecology

## 2017-11-11 NOTE — Telephone Encounter (Signed)
Dr Elonda Husky has an refill request from pharmacy and pt wanted Dr Elonda Husky to know that she has an appt Dr Ernie Hew in Aug 26 so she needs to make sure he refills meds, at least until then, I explained that Dr Elonda Husky was out of town and I was not sure it could get refilled today but she wanted to see if anyone here would please because she is out and is scared she will have withdrawls this weekend. Please call pt and let her know if we can call in or not

## 2017-11-11 NOTE — Telephone Encounter (Signed)
Per chart review Dr. Elonda Husky sent in refill last night. Patient informed.

## 2017-11-25 ENCOUNTER — Ambulatory Visit: Payer: Medicaid Other | Admitting: Obstetrics & Gynecology

## 2017-11-25 ENCOUNTER — Encounter: Payer: Self-pay | Admitting: Obstetrics & Gynecology

## 2017-12-30 ENCOUNTER — Telehealth: Payer: Self-pay | Admitting: Obstetrics & Gynecology

## 2017-12-30 MED ORDER — CLONAZEPAM 1 MG PO TABS: ORAL_TABLET | ORAL | 1 refills | Status: DC

## 2017-12-30 NOTE — Telephone Encounter (Signed)
See note

## 2018-01-02 NOTE — Telephone Encounter (Signed)
Meds ordered this encounter  Medications  . clonazePAM (KLONOPIN) 1 MG tablet    Sig: TAKE (1) TABLET BY MOUTH THREE TIMES DAILY AS NEEDED FOR ANXIETY.    Dispense:  90 tablet    Refill:  1

## 2018-02-13 ENCOUNTER — Ambulatory Visit (INDEPENDENT_AMBULATORY_CARE_PROVIDER_SITE_OTHER): Payer: Medicaid Other | Admitting: Adult Health

## 2018-02-13 ENCOUNTER — Encounter: Payer: Self-pay | Admitting: Adult Health

## 2018-02-13 ENCOUNTER — Encounter (INDEPENDENT_AMBULATORY_CARE_PROVIDER_SITE_OTHER): Payer: Self-pay

## 2018-02-13 ENCOUNTER — Other Ambulatory Visit: Payer: Self-pay

## 2018-02-13 VITALS — BP 125/77 | HR 101 | Ht 63.0 in | Wt 156.0 lb

## 2018-02-13 DIAGNOSIS — N926 Irregular menstruation, unspecified: Secondary | ICD-10-CM | POA: Diagnosis not present

## 2018-02-13 DIAGNOSIS — Z3A01 Less than 8 weeks gestation of pregnancy: Secondary | ICD-10-CM | POA: Insufficient documentation

## 2018-02-13 DIAGNOSIS — Z3201 Encounter for pregnancy test, result positive: Secondary | ICD-10-CM | POA: Diagnosis not present

## 2018-02-13 DIAGNOSIS — F1991 Other psychoactive substance use, unspecified, in remission: Secondary | ICD-10-CM

## 2018-02-13 DIAGNOSIS — O3680X0 Pregnancy with inconclusive fetal viability, not applicable or unspecified: Secondary | ICD-10-CM | POA: Insufficient documentation

## 2018-02-13 DIAGNOSIS — Z87898 Personal history of other specified conditions: Secondary | ICD-10-CM

## 2018-02-13 LAB — POCT URINE PREGNANCY: Preg Test, Ur: POSITIVE — AB

## 2018-02-13 MED ORDER — PRENATAL PLUS 27-1 MG PO TABS: 1.0000 | ORAL_TABLET | Freq: Every day | ORAL | 12 refills | Status: DC

## 2018-02-13 NOTE — Progress Notes (Signed)
  Subjective:     Patient ID: Patricia Richards, female   DOB: 12-Oct-1990, 27 y.o.   MRN: [id number]  HPI Patricia Richards is a 27 year old white female, married in for UPT, has missed a period and had +HPT.She is taking Klonopin and needs refill, to have drug store fax to Dr Elonda Husky.  Review of Systems +missed period, +HPT +nausea and some cramping Reviewed past medical,surgical, social and family history. Reviewed medications and allergies.     Objective:   Physical Exam BP 125/77 (BP Location: Right Arm, Patient Position: Sitting, Cuff Size: Normal)   Pulse (!) 101   Ht 5\' 3"  (1.6 m)   Wt 156 lb (70.8 kg)   LMP 12/20/2017   Breastfeeding? No   BMI 27.63 kg/m UPT +, about 7+6 weeks by LMP with EDD 09/26/18.Skin warm and dry. Neck: mid line trachea, normal thyroid, good ROM, no lymphadenopathy noted. Lungs: clear to ausculation bilaterally. Cardiovascular: regular rate and rhythm.Abdomen is soft and non tender.    Assessment:     1. Positive pregnancy test   2. Less than [redacted] weeks gestation of pregnancy   3. Encounter to determine fetal viability of pregnancy, single or unspecified fetus   4. History of drug use       Plan:    Eat often  Meds ordered this encounter  Medications  . prenatal vitamin w/FE, FA (PRENATAL 1 + 1) 27-1 MG TABS tablet    Sig: Take 1 tablet by mouth daily at 12 noon.    Dispense:  30 each    Refill:  12    Order Specific Question:   Supervising Provider    Answer:   Florian Buff [[id number]]  Dating [place] 10/7 at 11:30 at [place] handouts on First trimester and by [facility]

## 2018-02-13 NOTE — Patient Instructions (Signed)
First Trimester of Pregnancy The first trimester of pregnancy is from week 1 until the end of week 13 (months 1 through 3). A week after a sperm fertilizes an egg, the egg will implant on the wall of the uterus. This embryo will begin to develop into a baby. Genes from you and your partner will form the baby. The female genes will determine whether the baby will be a boy or a girl. At 6-8 weeks, the eyes and face will be formed, and the heartbeat can be seen on ultrasound. At the end of 12 weeks, all the baby's organs will be formed. Now that you are pregnant, you will want to do everything you can to have a healthy baby. Two of the most important things are to get good prenatal care and to follow your health care provider's instructions. Prenatal care is all the medical care you receive before the baby's birth. This care will help prevent, find, and treat any problems during the pregnancy and childbirth. Body changes during your first trimester Your body goes through many changes during pregnancy. The changes vary from woman to woman.  You may gain or lose a couple of pounds at first.  You may feel sick to your stomach (nauseous) and you may throw up (vomit). If the vomiting is uncontrollable, call your health care provider.  You may tire easily.  You may develop headaches that can be relieved by medicines. All medicines should be approved by your health care provider.  You may urinate more often. Painful urination may mean you have a bladder infection.  You may develop heartburn as a result of your pregnancy.  You may develop constipation because certain hormones are causing the muscles that push stool through your intestines to slow down.  You may develop hemorrhoids or swollen veins (varicose veins).  Your breasts may begin to grow larger and become tender. Your nipples may stick out more, and the tissue that surrounds them (areola) may become darker.  Your gums may bleed and may be  sensitive to brushing and flossing.  Dark spots or blotches (chloasma, mask of pregnancy) may develop on your face. This will likely fade after the baby is born.  Your menstrual periods will stop.  You may have a loss of appetite.  You may develop cravings for certain kinds of food.  You may have changes in your emotions from day to day, such as being excited to be pregnant or being concerned that something may go wrong with the pregnancy and baby.  You may have more vivid and strange dreams.  You may have changes in your hair. These can include thickening of your hair, rapid growth, and changes in texture. Some women also have hair loss during or after pregnancy, or hair that feels dry or thin. Your hair will most likely return to normal after your baby is born.  What to expect at prenatal visits During a routine prenatal visit:  You will be weighed to make sure you and the baby are growing normally.  Your blood pressure will be taken.  Your abdomen will be measured to track your baby's growth.  The fetal heartbeat will be listened to between weeks 10 and 14 of your pregnancy.  Test results from any previous visits will be discussed.  Your health care provider may ask you:  How you are feeling.  If you are feeling the baby move.  If you have had any abnormal symptoms, such as leaking fluid, bleeding, severe headaches,   or abdominal cramping.  If you are using any tobacco products, including cigarettes, chewing tobacco, and electronic cigarettes.  If you have any questions.  Other tests that may be performed during your first trimester include:  Blood tests to find your blood type and to check for the presence of any previous infections. The tests will also be used to check for low iron levels (anemia) and protein on red blood cells (Rh antibodies). Depending on your risk factors, or if you previously had diabetes during pregnancy, you may have tests to check for high blood  sugar that affects pregnant women (gestational diabetes).  Urine tests to check for infections, diabetes, or protein in the urine.  An ultrasound to confirm the proper growth and development of the baby.  Fetal screens for spinal cord problems (spina bifida) and Down syndrome.  HIV (human immunodeficiency virus) testing. Routine prenatal testing includes screening for HIV, unless you choose not to have this test.  You may need other tests to make sure you and the baby are doing well.  Follow these instructions at home: Medicines  Follow your health care provider's instructions regarding medicine use. Specific medicines may be either safe or unsafe to take during pregnancy.  Take a prenatal vitamin that contains at least 600 micrograms (mcg) of folic acid.  If you develop constipation, try taking a stool softener if your health care provider approves. Eating and drinking  Eat a balanced diet that includes fresh fruits and vegetables, whole grains, good sources of protein such as meat, eggs, or tofu, and low-fat dairy. Your health care provider will help you determine the amount of weight gain that is right for you.  Avoid raw meat and uncooked cheese. These carry germs that can cause birth defects in the baby.  Eating four or five small meals rather than three large meals a day may help relieve nausea and vomiting. If you start to feel nauseous, eating a few soda crackers can be helpful. Drinking liquids between meals, instead of during meals, also seems to help ease nausea and vomiting.  Limit foods that are high in fat and processed sugars, such as fried and sweet foods.  To prevent constipation: ? Eat foods that are high in fiber, such as fresh fruits and vegetables, whole grains, and beans. ? Drink enough fluid to keep your urine clear or pale yellow. Activity  Exercise only as directed by your health care provider. Most women can continue their usual exercise routine during  pregnancy. Try to exercise for 30 minutes at least 5 days a week. Exercising will help you: ? Control your weight. ? Stay in shape. ? Be prepared for labor and delivery.  Experiencing pain or cramping in the lower abdomen or lower back is a good sign that you should stop exercising. Check with your health care provider before continuing with normal exercises.  Try to avoid standing for long periods of time. Move your legs often if you must stand in one place for a long time.  Avoid heavy lifting.  Wear low-heeled shoes and practice good posture.  You may continue to have sex unless your health care provider tells you not to. Relieving pain and discomfort  Wear a good support bra to relieve breast tenderness.  Take warm sitz baths to soothe any pain or discomfort caused by hemorrhoids. Use hemorrhoid cream if your health care provider approves.  Rest with your legs elevated if you have leg cramps or low back pain.  If you develop   varicose veins in your legs, wear support hose. Elevate your feet for 15 minutes, 3-4 times a day. Limit salt in your diet. Prenatal care  Schedule your prenatal visits by the twelfth week of pregnancy. They are usually scheduled monthly at first, then more often in the last 2 months before delivery.  Write down your questions. Take them to your prenatal visits.  Keep all your prenatal visits as told by your health care provider. This is important. Safety  Wear your seat belt at all times when driving.  Make a list of emergency phone numbers, including numbers for family, friends, the hospital, and police and fire departments. General instructions  Ask your health care provider for a referral to a local prenatal education class. Begin classes no later than the beginning of month 6 of your pregnancy.  Ask for help if you have counseling or nutritional needs during pregnancy. Your health care provider can offer advice or refer you to specialists for help  with various needs.  Do not use hot tubs, steam rooms, or saunas.  Do not douche or use tampons or scented sanitary pads.  Do not cross your legs for long periods of time.  Avoid cat litter boxes and soil used by cats. These carry germs that can cause birth defects in the baby and possibly loss of the fetus by miscarriage or stillbirth.  Avoid all smoking, herbs, alcohol, and medicines not prescribed by your health care provider. Chemicals in these products affect the formation and growth of the baby.  Do not use any products that contain nicotine or tobacco, such as cigarettes and e-cigarettes. If you need help quitting, ask your health care provider. You may receive counseling support and other resources to help you quit.  Schedule a dentist appointment. At home, brush your teeth with a soft toothbrush and be gentle when you floss. Contact a health care provider if:  You have dizziness.  You have mild pelvic cramps, pelvic pressure, or nagging pain in the abdominal area.  You have persistent nausea, vomiting, or diarrhea.  You have a bad smelling vaginal discharge.  You have pain when you urinate.  You notice increased swelling in your face, hands, legs, or ankles.  You are exposed to fifth disease or chickenpox.  You are exposed to German measles (rubella) and have never had it. Get help right away if:  You have a fever.  You are leaking fluid from your vagina.  You have spotting or bleeding from your vagina.  You have severe abdominal cramping or pain.  You have rapid weight gain or loss.  You vomit blood or material that looks like coffee grounds.  You develop a severe headache.  You have shortness of breath.  You have any kind of trauma, such as from a fall or a car accident. Summary  The first trimester of pregnancy is from week 1 until the end of week 13 (months 1 through 3).  Your body goes through many changes during pregnancy. The changes vary from  woman to woman.  You will have routine prenatal visits. During those visits, your health care provider will examine you, discuss any test results you may have, and talk with you about how you are feeling. This information is not intended to replace advice given to you by your health care provider. Make sure you discuss any questions you have with your health care provider. Document Released: 04/27/2001 Document Revised: 04/14/2016 Document Reviewed: 04/14/2016 Elsevier Interactive Patient Education  2018 Elsevier   Inc.  

## 2018-02-20 ENCOUNTER — Ambulatory Visit (HOSPITAL_COMMUNITY)
Admission: RE | Admit: 2018-02-20 | Discharge: 2018-02-20 | Disposition: A | Discharge status: Home / Self Care | Payer: Medicaid Other | Source: Ambulatory Visit | Attending: Adult Health | Admitting: Adult Health

## 2018-02-20 DIAGNOSIS — O3680X0 Pregnancy with inconclusive fetal viability, not applicable or unspecified: Secondary | ICD-10-CM | POA: Diagnosis present

## 2018-02-20 DIAGNOSIS — Z3A08 8 weeks gestation of pregnancy: Secondary | ICD-10-CM | POA: Insufficient documentation

## 2018-02-24 ENCOUNTER — Other Ambulatory Visit: Payer: Self-pay | Admitting: Obstetrics & Gynecology

## 2018-02-28 ENCOUNTER — Telehealth: Payer: Self-pay | Admitting: Obstetrics & Gynecology

## 2018-02-28 ENCOUNTER — Other Ambulatory Visit: Payer: Self-pay | Admitting: Obstetrics & Gynecology

## 2018-02-28 MED ORDER — CLONAZEPAM 1 MG PO TABS: ORAL_TABLET | ORAL | 1 refills | Status: DC

## 2018-02-28 NOTE — Telephone Encounter (Signed)
Patient called stating that she needs a refill of her Clonazepam, Pt states that her pharmacy as faxed over the request. Pt states that if she doesn't pick up please let her husband Patricia Richards know. Please contact pt

## 2018-02-28 NOTE — Telephone Encounter (Signed)
Informed pt that rx for klonopin was sent to her pharmacy. Pt verbalized understanding.

## 2018-02-28 NOTE — Telephone Encounter (Signed)
done

## 2018-03-02 ENCOUNTER — Encounter: Payer: Self-pay | Admitting: Advanced Practice Midwife

## 2018-03-02 ENCOUNTER — Encounter: Payer: Medicaid Other | Admitting: Advanced Practice Midwife

## 2018-03-02 ENCOUNTER — Ambulatory Visit: Payer: Medicaid Other | Admitting: *Deleted

## 2018-03-02 ENCOUNTER — Ambulatory Visit (INDEPENDENT_AMBULATORY_CARE_PROVIDER_SITE_OTHER): Payer: Medicaid Other | Admitting: Advanced Practice Midwife

## 2018-03-02 VITALS — Wt 154.0 lb

## 2018-03-02 DIAGNOSIS — Z3682 Encounter for antenatal screening for nuchal translucency: Secondary | ICD-10-CM

## 2018-03-02 DIAGNOSIS — O0990 Supervision of high risk pregnancy, unspecified, unspecified trimester: Secondary | ICD-10-CM | POA: Insufficient documentation

## 2018-03-02 DIAGNOSIS — Z331 Pregnant state, incidental: Secondary | ICD-10-CM

## 2018-03-02 DIAGNOSIS — Z3481 Encounter for supervision of other normal pregnancy, first trimester: Secondary | ICD-10-CM | POA: Diagnosis not present

## 2018-03-02 DIAGNOSIS — O09899 Supervision of other high risk pregnancies, unspecified trimester: Secondary | ICD-10-CM | POA: Insufficient documentation

## 2018-03-02 DIAGNOSIS — Z3A10 10 weeks gestation of pregnancy: Secondary | ICD-10-CM

## 2018-03-02 DIAGNOSIS — Z1389 Encounter for screening for other disorder: Secondary | ICD-10-CM

## 2018-03-02 LAB — POCT URINALYSIS DIPSTICK OB
Blood, UA: NEGATIVE
GLUCOSE, UA: NEGATIVE
KETONES UA: NEGATIVE
Leukocytes, UA: NEGATIVE
Nitrite, UA: NEGATIVE
POC,PROTEIN,UA: NEGATIVE

## 2018-03-02 NOTE — Patient Instructions (Signed)
 First Trimester of Pregnancy The first trimester of pregnancy is from week 1 until the end of week 12 (months 1 through 3). A week after a sperm fertilizes an egg, the egg will implant on the wall of the uterus. This embryo will begin to develop into a baby. Genes from you and your partner are forming the baby. The female genes determine whether the baby is a boy or a girl. At 6-8 weeks, the eyes and face are formed, and the heartbeat can be seen on ultrasound. At the end of 12 weeks, all the baby's organs are formed.  Now that you are pregnant, you will want to do everything you can to have a healthy baby. Two of the most important things are to get good prenatal care and to follow your health care provider's instructions. Prenatal care is all the medical care you receive before the baby's birth. This care will help prevent, find, and treat any problems during the pregnancy and childbirth. BODY CHANGES Your body goes through many changes during pregnancy. The changes vary from woman to woman.   You may gain or lose a couple of pounds at first.  You may feel sick to your stomach (nauseous) and throw up (vomit). If the vomiting is uncontrollable, call your health care provider.  You may tire easily.  You may develop headaches that can be relieved by medicines approved by your health care provider.  You may urinate more often. Painful urination may mean you have a bladder infection.  You may develop heartburn as a result of your pregnancy.  You may develop constipation because certain hormones are causing the muscles that push waste through your intestines to slow down.  You may develop hemorrhoids or swollen, bulging veins (varicose veins).  Your breasts may begin to grow larger and become tender. Your nipples may stick out more, and the tissue that surrounds them (areola) may become darker.  Your gums may bleed and may be sensitive to brushing and flossing.  Dark spots or blotches  (chloasma, mask of pregnancy) may develop on your face. This will likely fade after the baby is born.  Your menstrual periods will stop.  You may have a loss of appetite.  You may develop cravings for certain kinds of food.  You may have changes in your emotions from day to day, such as being excited to be pregnant or being concerned that something may go wrong with the pregnancy and baby.  You may have more vivid and strange dreams.  You may have changes in your hair. These can include thickening of your hair, rapid growth, and changes in texture. Some women also have hair loss during or after pregnancy, or hair that feels dry or thin. Your hair will most likely return to normal after your baby is born. WHAT TO EXPECT AT YOUR PRENATAL VISITS During a routine prenatal visit:  You will be weighed to make sure you and the baby are growing normally.  Your blood pressure will be taken.  Your abdomen will be measured to track your baby's growth.  The fetal heartbeat will be listened to starting around week 10 or 12 of your pregnancy.  Test results from any previous visits will be discussed. Your health care provider may ask you:  How you are feeling.  If you are feeling the baby move.  If you have had any abnormal symptoms, such as leaking fluid, bleeding, severe headaches, or abdominal cramping.  If you have any questions. Other   tests that may be performed during your first trimester include:  Blood tests to find your blood type and to check for the presence of any previous infections. They will also be used to check for low iron levels (anemia) and Rh antibodies. Later in the pregnancy, blood tests for diabetes will be done along with other tests if problems develop.  Urine tests to check for infections, diabetes, or protein in the urine.  An ultrasound to confirm the proper growth and development of the baby.  An amniocentesis to check for possible genetic problems.  Fetal  screens for spina bifida and Down syndrome.  You may need other tests to make sure you and the baby are doing well. HOME CARE INSTRUCTIONS  Medicines  Follow your health care provider's instructions regarding medicine use. Specific medicines may be either safe or unsafe to take during pregnancy.  Take your prenatal vitamins as directed.  If you develop constipation, try taking a stool softener if your health care provider approves. Diet  Eat regular, well-balanced meals. Choose a variety of foods, such as meat or vegetable-based protein, fish, milk and low-fat dairy products, vegetables, fruits, and whole grain breads and cereals. Your health care provider will help you determine the amount of weight gain that is right for you.  Avoid raw meat and uncooked cheese. These carry germs that can cause birth defects in the baby.  Eating four or five small meals rather than three large meals a day may help relieve nausea and vomiting. If you start to feel nauseous, eating a few soda crackers can be helpful. Drinking liquids between meals instead of during meals also seems to help nausea and vomiting.  If you develop constipation, eat more high-fiber foods, such as fresh vegetables or fruit and whole grains. Drink enough fluids to keep your urine clear or pale yellow. Activity and Exercise  Exercise only as directed by your health care provider. Exercising will help you:  Control your weight.  Stay in shape.  Be prepared for labor and delivery.  Experiencing pain or cramping in the lower abdomen or low back is a good sign that you should stop exercising. Check with your health care provider before continuing normal exercises.  Try to avoid standing for long periods of time. Move your legs often if you must stand in one place for a long time.  Avoid heavy lifting.  Wear low-heeled shoes, and practice good posture.  You may continue to have sex unless your health care provider directs you  otherwise. Relief of Pain or Discomfort  Wear a good support bra for breast tenderness.   Take warm sitz baths to soothe any pain or discomfort caused by hemorrhoids. Use hemorrhoid cream if your health care provider approves.   Rest with your legs elevated if you have leg cramps or low back pain.  If you develop varicose veins in your legs, wear support hose. Elevate your feet for 15 minutes, 3-4 times a day. Limit salt in your diet. Prenatal Care  Schedule your prenatal visits by the twelfth week of pregnancy. They are usually scheduled monthly at first, then more often in the last 2 months before delivery.  Write down your questions. Take them to your prenatal visits.  Keep all your prenatal visits as directed by your health care provider. Safety  Wear your seat belt at all times when driving.  Make a list of emergency phone numbers, including numbers for family, friends, the hospital, and police and fire departments. General   Tips  Ask your health care provider for a referral to a local prenatal education class. Begin classes no later than at the beginning of month 6 of your pregnancy.  Ask for help if you have counseling or nutritional needs during pregnancy. Your health care provider can offer advice or refer you to specialists for help with various needs.  Do not use hot tubs, steam rooms, or saunas.  Do not douche or use tampons or scented sanitary pads.  Do not cross your legs for long periods of time.  Avoid cat litter boxes and soil used by cats. These carry germs that can cause birth defects in the baby and possibly loss of the fetus by miscarriage or stillbirth.  Avoid all smoking, herbs, alcohol, and medicines not prescribed by your health care provider. Chemicals in these affect the formation and growth of the baby.  Schedule a dentist appointment. At home, brush your teeth with a soft toothbrush and be gentle when you floss. SEEK MEDICAL CARE IF:   You have  dizziness.  You have mild pelvic cramps, pelvic pressure, or nagging pain in the abdominal area.  You have persistent nausea, vomiting, or diarrhea.  You have a bad smelling vaginal discharge.  You have pain with urination.  You notice increased swelling in your face, hands, legs, or ankles. SEEK IMMEDIATE MEDICAL CARE IF:   You have a fever.  You are leaking fluid from your vagina.  You have spotting or bleeding from your vagina.  You have severe abdominal cramping or pain.  You have rapid weight gain or loss.  You vomit blood or material that looks like coffee grounds.  You are exposed to German measles and have never had them.  You are exposed to fifth disease or chickenpox.  You develop a severe headache.  You have shortness of breath.  You have any kind of trauma, such as from a fall or a car accident. Document Released: 04/27/2001 Document Revised: 09/17/2013 Document Reviewed: 03/13/2013 [employer] Patient Information 2015 [employer]. This information is not intended to replace advice given to you by your health care provider. Make sure you discuss any questions you have with your health care provider.   Nausea & Vomiting  Have saltine crackers or pretzels by your bed and eat a few bites before you raise your head out of bed in the morning  Eat small frequent meals throughout the day instead of large meals  Drink plenty of fluids throughout the day to stay hydrated, just don't drink a lot of fluids with your meals.  This can make your stomach fill up faster making you feel sick  Do not brush your teeth right after you eat  Products with real ginger are good for nausea, like ginger ale and ginger hard candy Make sure it says made with real ginger!  Sucking on sour candy like lemon heads is also good for nausea  If your prenatal vitamins make you nauseated, take them at night so you will sleep through the nausea  Sea Bands  If you feel like you need  medicine for the nausea & vomiting please let us know  If you are unable to keep any fluids or food down please let us know   Constipation  Drink plenty of fluid, preferably water, throughout the day  Eat foods high in fiber such as fruits, vegetables, and grains  Exercise, such as walking, is a good way to keep your bowels regular  Drink warm fluids, especially warm   prune juice, or decaf coffee  Eat a 1/2 cup of real oatmeal (not instant), 1/2 cup applesauce, and 1/2-1 cup warm prune juice every day  If needed, you may take Colace (docusate sodium) stool softener once or twice a day to help keep the stool soft. If you are pregnant, wait until you are out of your first trimester (12-14 weeks of pregnancy)  If you still are having problems with constipation, you may take Miralax once daily as needed to help keep your bowels regular.  If you are pregnant, wait until you are out of your first trimester (12-14 weeks of pregnancy)  Safe Medications in Pregnancy   Acne: Benzoyl Peroxide Salicylic Acid  Backache/Headache: Tylenol: 2 regular strength every 4 hours OR              2 Extra strength every 6 hours  Colds/Coughs/Allergies: Benadryl (alcohol free) 25 mg every 6 hours as needed Breath right strips Claritin Cepacol throat lozenges Chloraseptic throat spray Cold-Eeze- up to three times per day Cough drops, alcohol free Flonase (by prescription only) Guaifenesin Mucinex Robitussin DM (plain only, alcohol free) Saline nasal spray/drops Sudafed (pseudoephedrine) & Actifed ** use only after [redacted] weeks gestation and if you do not have high blood pressure Tylenol Vicks Vaporub Zinc lozenges Zyrtec   Constipation: Colace Ducolax suppositories Fleet enema Glycerin suppositories Metamucil Milk of magnesia Miralax Senokot Smooth move tea  Diarrhea: Kaopectate Imodium A-D  *NO pepto Bismol  Hemorrhoids: Anusol Anusol HC Preparation  H Tucks  Indigestion: Tums Maalox Mylanta Zantac  Pepcid  Insomnia: Benadryl (alcohol free) 25mg every 6 hours as needed Tylenol PM Unisom, no Gelcaps  Leg Cramps: Tums MagGel  Nausea/Vomiting:  Bonine Dramamine Emetrol Ginger extract Sea bands Meclizine  Nausea medication to take during pregnancy:  Unisom (doxylamine succinate 25 mg tablets) Take one tablet daily at bedtime. If symptoms are not adequately controlled, the dose can be increased to a maximum recommended dose of two tablets daily (1/2 tablet in the morning, 1/2 tablet mid-afternoon and one at bedtime). Vitamin B6 100mg tablets. Take one tablet twice a day (up to 200 mg per day).  Skin Rashes: Aveeno products Benadryl cream or 25mg every 6 hours as needed Calamine Lotion 1% cortisone cream  Yeast infection: Gyne-lotrimin 7 Monistat 7   **If taking multiple medications, please check labels to avoid duplicating the same active ingredients **take medication as directed on the label ** Do not exceed 4000 mg of tylenol in 24 hours **Do not take medications that contain aspirin or ibuprofen      

## 2018-03-02 NOTE — Progress Notes (Signed)
INITIAL OBSTETRICAL VISIT Patient name: Patricia Richards MRN [id number]  Date of birth: 1991-02-11 Chief Complaint:   Initial Prenatal Visit  History of Present Illness:   Patricia Richards Patricia Richards is a 27 y.o. [phone] Caucasian female at [redacted]w[redacted]d by LMP/early [place] with an Estimated Date of Delivery: 09/26/18 being seen today for her initial obstetrical visit.   Her obstetrical history is significant for ters SVD X 4, last one in April. 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   Today she reports some upper back pain for a m onth or so. + trigger Patient's last menstrual period was 12/20/2017. Last pap 03/03/18. Results were: normal Review of Systems:   Pertinent items are noted in HPI Denies cramping/contractions, leakage of fluid, vaginal bleeding, abnormal vaginal discharge w/ itching/odor/irritation, headaches, visual changes, shortness of breath, chest pain, abdominal pain, severe nausea/vomiting, or problems with urination or bowel movements unless otherwise stated above.  Pertinent History Reviewed:  Reviewed past medical,surgical, social, obstetrical and family history.  Reviewed problem list, medications and allergies. OB History  Gravida Para Term Preterm AB Living  6 4 4  0 1 4  SAB TAB Ectopic Multiple Live Births  0 1 0 0 4    # Outcome Date GA Lbr Len/2nd Weight Sex Delivery Anes PTL Lv  6 Current           5 Term 08/26/17 [redacted]w[redacted]d 01:27 / 00:15 8 lb 12.2 oz (3.975 kg) M Vag-Spont EPI N LIV  4 Term 11/21/15 [redacted]w[redacted]d  8 lb 13 oz (3.997 kg) M Vag-Spont EPI N LIV  3 Term 12/18/13 [redacted]w[redacted]d 14:05 / 01:03 7 lb 9 oz (3.43 kg) M Vag-Spont EPI N LIV     Birth Comments: caput  2 Term 05/28/10 [redacted]w[redacted]d  8 lb 7 oz (3.827 kg) M Vag-Vacuum EPI N LIV     Birth Comments: states induced early due to PUPPS  1 TAB 2012           Physical Assessment:   Vitals:   03/02/18 1526  Weight: 154 lb (69.9 kg)  Body mass index is 27.28 kg/m.       Physical Examination:  General appearance -  well appearing, and in no distress  Mental status - alert, oriented to person, place, and time  Psych:  She has a normal mood and affect  Skin - warm and dry, normal color, no suspicious lesions noted  Chest - effort normal, all lung fields clear to auscultation bilaterally  Heart - normal rate and regular rhythm  Abdomen - soft, nontender  Extremities:  No swelling or varicosities noted   Results for orders placed or performed in visit on 03/02/18 (from the past 24 hour(s))  POC Urinalysis Dipstick OB   Collection Time: 03/02/18  3:47 PM  Result Value Ref Range   Color, UA     Clarity, UA     Glucose, UA Negative Negative   Bilirubin, UA     Ketones, UA neg    Spec Grav, UA     Blood, UA neg    pH, UA     POC Protein UA Negative Negative, Trace   Urobilinogen, UA     Nitrite, UA neg    Leukocytes, UA Negative Negative   Appearance     Odor      Assessment & Plan:  1) Low-Risk Pregnancy [id number] at [redacted]w[redacted]d with an Estimated Date of Delivery: 09/26/18   2) Initial OB visit  3) klonopin  use  Meds: No orders of the defined types were placed in this encounter.   Initial labs obtained Continue prenatal vitamins Reviewed n/v relief measures and warning s/s to report Reviewed recommended weight gain based on pre-gravid BMI Encouraged well-balanced diet Genetic Screening discussed Integrated Screen: requested Cystic fibrosis screening discussed requested Ultrasound discussed; fetal survey: requested [place] completed  Follow-up: Return in about 3 weeks (around 03/23/2018) for LROB, US:NT+1st IT.   Orders Placed This Encounter  Procedures  . GC/Chlamydia Probe Amp  . Urine Culture  . Urinalysis, Routine w reflex microscopic  . Obstetric Panel, Including HIV  . Pain Management Screening Profile (10S)  . Cystic Fibrosis Mutation 97  . POC Urinalysis Dipstick OB    Christin Fudge DNP, CNM 03/02/2018 4:14 PM

## 2018-03-03 LAB — URINALYSIS, ROUTINE W REFLEX MICROSCOPIC
BILIRUBIN UA: NEGATIVE
Glucose, UA: NEGATIVE
KETONES UA: NEGATIVE
Leukocytes, UA: NEGATIVE
Nitrite, UA: NEGATIVE
RBC UA: NEGATIVE
SPEC GRAV UA: 1.024 (ref 1.005–1.030)
Urobilinogen, Ur: 0.2 mg/dL (ref 0.2–1.0)
pH, UA: 6.5 (ref 5.0–7.5)

## 2018-03-03 LAB — OBSTETRIC PANEL, INCLUDING HIV
ANTIBODY SCREEN: NEGATIVE
BASOS ABS: 0 10*3/uL (ref 0.0–0.2)
BASOS: 0 %
EOS (ABSOLUTE): 0.1 10*3/uL (ref 0.0–0.4)
EOS: 1 %
HEMATOCRIT: 36 % (ref 34.0–46.6)
HEP B S AG: NEGATIVE
HIV SCREEN 4TH GENERATION: NONREACTIVE
Hemoglobin: 12 g/dL (ref 11.1–15.9)
Immature Grans (Abs): 0 10*3/uL (ref 0.0–0.1)
Immature Granulocytes: 0 %
LYMPHS ABS: 1.8 10*3/uL (ref 0.7–3.1)
LYMPHS: 22 %
MCH: 28.9 pg (ref 26.6–33.0)
MCHC: 33.3 g/dL (ref 31.5–35.7)
MCV: 87 fL (ref 79–97)
MONOCYTES: 6 %
MONOS ABS: 0.5 10*3/uL (ref 0.1–0.9)
NEUTROS ABS: 5.8 10*3/uL (ref 1.4–7.0)
NEUTROS PCT: 71 %
Platelets: 300 10*3/uL (ref 150–450)
RBC: 4.15 x10E6/uL (ref 3.77–5.28)
RDW: 12.7 % (ref 12.3–15.4)
RPR: NONREACTIVE
RUBELLA: 2.62 {index} (ref >0.99)
Rh Factor: NEGATIVE
WBC: 8.2 10*3/uL (ref 3.4–10.8)

## 2018-03-04 LAB — PMP SCREEN PROFILE (10S), URINE
AMPHETAMINE SCREEN URINE: NEGATIVE ng/mL
BARBITURATE SCREEN URINE: NEGATIVE ng/mL
BENZODIAZEPINE SCREEN, URINE: POSITIVE ng/mL — AB
CANNABINOIDS UR QL SCN: NEGATIVE ng/mL
Cocaine (Metab) Scrn, Ur: NEGATIVE ng/mL
Creatinine(Crt), U: 175.2 mg/dL (ref 20.0–300.0)
Methadone Screen, Urine: NEGATIVE ng/mL
OXYCODONE+OXYMORPHONE UR QL SCN: NEGATIVE ng/mL
Opiate Scrn, Ur: NEGATIVE ng/mL
PH UR, DRUG SCRN: 6.8 (ref 4.5–8.9)
Phencyclidine Qn, Ur: NEGATIVE ng/mL
Propoxyphene Scrn, Ur: NEGATIVE ng/mL

## 2018-03-04 LAB — URINE CULTURE: Organism ID, Bacteria: NO GROWTH

## 2018-03-04 LAB — GC/CHLAMYDIA PROBE AMP
CHLAMYDIA, DNA PROBE: NEGATIVE
NEISSERIA GONORRHOEAE BY PCR: NEGATIVE

## 2018-03-10 LAB — CYSTIC FIBROSIS MUTATION 97: GENE DIS ANAL CARRIER INTERP BLD/T-IMP: NOT DETECTED

## 2018-03-20 ENCOUNTER — Ambulatory Visit: Payer: Medicaid Other | Admitting: *Deleted

## 2018-03-20 ENCOUNTER — Encounter: Payer: Medicaid Other | Admitting: Women's Health

## 2018-03-22 ENCOUNTER — Other Ambulatory Visit (HOSPITAL_COMMUNITY): Payer: Self-pay | Admitting: Advanced Practice Midwife

## 2018-03-22 DIAGNOSIS — Z3682 Encounter for antenatal screening for nuchal translucency: Secondary | ICD-10-CM

## 2018-03-23 ENCOUNTER — Ambulatory Visit (INDEPENDENT_AMBULATORY_CARE_PROVIDER_SITE_OTHER): Payer: Medicaid Other | Admitting: Women's Health

## 2018-03-23 ENCOUNTER — Ambulatory Visit (INDEPENDENT_AMBULATORY_CARE_PROVIDER_SITE_OTHER): Payer: Medicaid Other

## 2018-03-23 ENCOUNTER — Encounter: Payer: Self-pay | Admitting: Women's Health

## 2018-03-23 VITALS — BP 112/72 | HR 110 | Wt 155.0 lb

## 2018-03-23 DIAGNOSIS — O09899 Supervision of other high risk pregnancies, unspecified trimester: Secondary | ICD-10-CM

## 2018-03-23 DIAGNOSIS — Z3402 Encounter for supervision of normal first pregnancy, second trimester: Secondary | ICD-10-CM

## 2018-03-23 DIAGNOSIS — Z3A13 13 weeks gestation of pregnancy: Secondary | ICD-10-CM

## 2018-03-23 DIAGNOSIS — Z331 Pregnant state, incidental: Secondary | ICD-10-CM

## 2018-03-23 DIAGNOSIS — Z3481 Encounter for supervision of other normal pregnancy, first trimester: Secondary | ICD-10-CM

## 2018-03-23 DIAGNOSIS — Z1389 Encounter for screening for other disorder: Secondary | ICD-10-CM

## 2018-03-23 DIAGNOSIS — Z1379 Encounter for other screening for genetic and chromosomal anomalies: Secondary | ICD-10-CM

## 2018-03-23 DIAGNOSIS — Z3682 Encounter for antenatal screening for nuchal translucency: Secondary | ICD-10-CM | POA: Diagnosis not present

## 2018-03-23 LAB — POCT URINALYSIS DIPSTICK OB
Blood, UA: NEGATIVE
Glucose, UA: NEGATIVE
KETONES UA: NEGATIVE
Leukocytes, UA: NEGATIVE
Nitrite, UA: NEGATIVE
PROTEIN: NEGATIVE

## 2018-03-23 NOTE — Progress Notes (Signed)
   LOW-RISK PREGNANCY VISIT Patient name: Patricia Richards MRN [id number]  Date of birth: 12-09-1990 Chief Complaint:   Routine Prenatal Visit (NT/ IT)  History of Present Illness:   Patricia Richards is a 27 y.o. [phone] female at [redacted]w[redacted]d with an Estimated Date of Delivery: 09/26/18 being seen today for ongoing management of a low-risk pregnancy.  Today she reports no complaints. Contractions: Not present.  .  Movement: Absent. denies leaking of fluid. Review of Systems:   Pertinent items are noted in HPI Denies abnormal vaginal discharge w/ itching/odor/irritation, headaches, visual changes, shortness of breath, chest pain, abdominal pain, severe nausea/vomiting, or problems with urination or bowel movements unless otherwise stated above. Pertinent History Reviewed:  Reviewed past medical,surgical, social, obstetrical and family history.  Reviewed problem list, medications and allergies. Physical Assessment:   Vitals:   03/23/18 1608  BP: 112/72  Pulse: (!) 110  Weight: 155 lb (70.3 kg)  Body mass index is 27.46 kg/m.        Physical Examination:   General appearance: Well appearing, and in no distress  Mental status: Alert, oriented to person, place, and time  Skin: Warm & dry  Cardiovascular: Normal heart rate noted  Respiratory: Normal respiratory effort, no distress  Abdomen: Soft, gravid, nontender  Pelvic: Cervical exam deferred         Extremities: Edema: None  Fetal Status: Fetal Heart Rate (bpm): 151 u/s   Movement: Absent    [place] 13+2 wks,measurements c/w dates,NB present,NT 1.5 mm,normal ovaries bilat,fhr 151 bpm,anterior placenta,crl 69.71 mm  Results for orders placed or performed in visit on 03/23/18 (from the past 24 hour(s))  POC Urinalysis Dipstick OB   Collection Time: 03/23/18  4:13 PM  Result Value Ref Range   Color, UA     Clarity, UA     Glucose, UA Negative Negative   Bilirubin, UA     Ketones, UA neg    Spec Grav, UA     Blood, UA neg    pH, UA     POC,PROTEIN,UA Negative Negative, Trace   Urobilinogen, UA     Nitrite, UA neg    Leukocytes, UA Negative Negative   Appearance     Odor      Assessment & Plan:  1) Low-risk pregnancy [id number] at [redacted]w[redacted]d with an Estimated Date of Delivery: 09/26/18    Meds: No orders of the defined types were placed in this encounter.  Labs/procedures today: 1st IT/NT, declined flu shot  Plan:  Continue routine obstetrical care   Reviewed: Preterm labor symptoms and general obstetric precautions including but not limited to vaginal bleeding, contractions, leaking of fluid and fetal movement were reviewed in detail with the patient.  All questions were answered  Follow-up: Return in about 3 weeks (around 04/13/2018) for [place], 2nd IT.  Orders Placed This Encounter  Procedures  . Integrated 1  . POC Urinalysis Dipstick OB   Roma Schanz CNM, [facility] 03/23/2018 4:29 PM

## 2018-03-23 NOTE — Progress Notes (Signed)
[place] 13+2 wks,measurements c/w dates,NB present,NT 1.5 mm,normal ovaries bilat,fhr 151 bpm,anterior placenta,crl 69.71 mm

## 2018-03-23 NOTE — Patient Instructions (Signed)
Patricia Richards Drafts, I greatly value your feedback.  If you receive a survey following your visit with [place] today, we appreciate you taking the time to fill it out.  Thanks, Knute Neu, CNM, WHNP-BC   Second Trimester of Pregnancy The second trimester is from week 14 through week 27 (months 4 through 6). The second trimester is often a time when you feel your best. Your body has adjusted to being pregnant, and you begin to feel better physically. Usually, morning sickness has lessened or quit completely, you may have more energy, and you may have an increase in appetite. The second trimester is also a time when the fetus is growing rapidly. At the end of the sixth month, the fetus is about 9 inches long and weighs about 1 pounds. You will likely begin to feel the baby move (quickening) between 16 and 20 weeks of pregnancy. Body changes during your second trimester Your body continues to go through many changes during your second trimester. The changes vary from woman to woman.  Your weight will continue to increase. You will notice your lower abdomen bulging out.  You may begin to get stretch marks on your hips, abdomen, and breasts.  You may develop headaches that can be relieved by medicines. The medicines should be approved by your health care provider.  You may urinate more often because the fetus is pressing on your bladder.  You may develop or continue to have heartburn as a result of your pregnancy.  You may develop constipation because certain hormones are causing the muscles that push waste through your intestines to slow down.  You may develop hemorrhoids or swollen, bulging veins (varicose veins).  You may have back pain. This is caused by: ? Weight gain. ? Pregnancy hormones that are relaxing the joints in your pelvis. ? A shift in weight and the muscles that support your balance.  Your breasts will continue to grow and they will continue to become tender.  Your gums may  bleed and may be sensitive to brushing and flossing.  Dark spots or blotches (chloasma, mask of pregnancy) may develop on your face. This will likely fade after the baby is born.  A dark line from your belly button to the pubic area (linea nigra) may appear. This will likely fade after the baby is born.  You may have changes in your hair. These can include thickening of your hair, rapid growth, and changes in texture. Some women also have hair loss during or after pregnancy, or hair that feels dry or thin. Your hair will most likely return to normal after your baby is born.  What to expect at prenatal visits During a routine prenatal visit:  You will be weighed to make sure you and the fetus are growing normally.  Your blood pressure will be taken.  Your abdomen will be measured to track your baby's growth.  The fetal heartbeat will be listened to.  Any test results from the previous visit will be discussed.  Your health care provider may ask you:  How you are feeling.  If you are feeling the baby move.  If you have had any abnormal symptoms, such as leaking fluid, bleeding, severe headaches, or abdominal cramping.  If you are using any tobacco products, including cigarettes, chewing tobacco, and electronic cigarettes.  If you have any questions.  Other tests that may be performed during your second trimester include:  Blood tests that check for: ? Low iron levels (anemia). ?  High blood sugar that affects pregnant women (gestational diabetes) between 20 and 28 weeks. ? Rh antibodies. This is to check for a protein on red blood cells (Rh factor).  Urine tests to check for infections, diabetes, or protein in the urine.  An ultrasound to confirm the proper growth and development of the baby.  An amniocentesis to check for possible genetic problems.  Fetal screens for spina bifida and Down syndrome.  HIV (human immunodeficiency virus) testing. Routine prenatal testing  includes screening for HIV, unless you choose not to have this test.  Follow these instructions at home: Medicines  Follow your health care provider's instructions regarding medicine use. Specific medicines may be either safe or unsafe to take during pregnancy.  Take a prenatal vitamin that contains at least 600 micrograms (mcg) of folic acid.  If you develop constipation, try taking a stool softener if your health care provider approves. Eating and drinking  Eat a balanced diet that includes fresh fruits and vegetables, whole grains, good sources of protein such as meat, eggs, or tofu, and low-fat dairy. Your health care provider will help you determine the amount of weight gain that is right for you.  Avoid raw meat and uncooked cheese. These carry germs that can cause birth defects in the baby.  If you have low calcium intake from food, talk to your health care provider about whether you should take a daily calcium supplement.  Limit foods that are high in fat and processed sugars, such as fried and sweet foods.  To prevent constipation: ? Drink enough fluid to keep your urine clear or pale yellow. ? Eat foods that are high in fiber, such as fresh fruits and vegetables, whole grains, and beans. Activity  Exercise only as directed by your health care provider. Most women can continue their usual exercise routine during pregnancy. Try to exercise for 30 minutes at least 5 days a week. Stop exercising if you experience uterine contractions.  Avoid heavy lifting, wear low heel shoes, and practice good posture.  A sexual relationship may be continued unless your health care provider directs you otherwise. Relieving pain and discomfort  Wear a good support bra to prevent discomfort from breast tenderness.  Take warm sitz baths to soothe any pain or discomfort caused by hemorrhoids. Use hemorrhoid cream if your health care provider approves.  Rest with your legs elevated if you have  leg cramps or low back pain.  If you develop varicose veins, wear support hose. Elevate your feet for 15 minutes, 3-4 times a day. Limit salt in your diet. Prenatal Care  Write down your questions. Take them to your prenatal visits.  Keep all your prenatal visits as told by your health care provider. This is important. Safety  Wear your seat belt at all times when driving.  Make a list of emergency phone numbers, including numbers for family, friends, the hospital, and police and fire departments. General instructions  Ask your health care provider for a referral to a local prenatal education class. Begin classes no later than the beginning of month 6 of your pregnancy.  Ask for help if you have counseling or nutritional needs during pregnancy. Your health care provider can offer advice or refer you to specialists for help with various needs.  Do not use hot tubs, steam rooms, or saunas.  Do not douche or use tampons or scented sanitary pads.  Do not cross your legs for long periods of time.  Avoid cat litter boxes and  soil used by cats. These carry germs that can cause birth defects in the baby and possibly loss of the fetus by miscarriage or stillbirth.  Avoid all smoking, herbs, alcohol, and unprescribed drugs. Chemicals in these products can affect the formation and growth of the baby.  Do not use any products that contain nicotine or tobacco, such as cigarettes and e-cigarettes. If you need help quitting, ask your health care provider.  Visit your dentist if you have not gone yet during your pregnancy. Use a soft toothbrush to brush your teeth and be gentle when you floss. Contact a health care provider if:  You have dizziness.  You have mild pelvic cramps, pelvic pressure, or nagging pain in the abdominal area.  You have persistent nausea, vomiting, or diarrhea.  You have a bad smelling vaginal discharge.  You have pain when you urinate. Get help right away if:  You  have a fever.  You are leaking fluid from your vagina.  You have spotting or bleeding from your vagina.  You have severe abdominal cramping or pain.  You have rapid weight gain or weight loss.  You have shortness of breath with chest pain.  You notice sudden or extreme swelling of your face, hands, ankles, feet, or legs.  You have not felt your baby move in over an hour.  You have severe headaches that do not go away when you take medicine.  You have vision changes. Summary  The second trimester is from week 14 through week 27 (months 4 through 6). It is also a time when the fetus is growing rapidly.  Your body goes through many changes during pregnancy. The changes vary from woman to woman.  Avoid all smoking, herbs, alcohol, and unprescribed drugs. These chemicals affect the formation and growth your baby.  Do not use any tobacco products, such as cigarettes, chewing tobacco, and e-cigarettes. If you need help quitting, ask your health care provider.  Contact your health care provider if you have any questions. Keep all prenatal visits as told by your health care provider. This is important. This information is not intended to replace advice given to you by your health care provider. Make sure you discuss any questions you have with your health care provider. Document Released: 04/27/2001 Document Revised: 10/09/2015 Document Reviewed: 07/04/2012 Elsevier Interactive Patient Education  2017 [employer].

## 2018-03-25 LAB — INTEGRATED 1
Crown Rump Length: 69.7 mm
Gest. Age on Collection Date: 13 wk
Maternal Age at EDD: 25.5 a
Nuchal Translucency (NT): 1.5 mm
Number of Fetuses: 1
PAPP-A Value: 680.5 ng/mL
Weight: 154 [lb_av]

## 2018-04-12 ENCOUNTER — Ambulatory Visit (INDEPENDENT_AMBULATORY_CARE_PROVIDER_SITE_OTHER): Payer: Medicaid Other | Admitting: Women's Health

## 2018-04-12 ENCOUNTER — Encounter: Payer: Self-pay | Admitting: Women's Health

## 2018-04-12 VITALS — BP 121/70 | HR 103 | Wt 155.0 lb

## 2018-04-12 DIAGNOSIS — Z1379 Encounter for other screening for genetic and chromosomal anomalies: Secondary | ICD-10-CM

## 2018-04-12 DIAGNOSIS — R000 Tachycardia, unspecified: Secondary | ICD-10-CM

## 2018-04-12 DIAGNOSIS — Z331 Pregnant state, incidental: Secondary | ICD-10-CM

## 2018-04-12 DIAGNOSIS — Z1389 Encounter for screening for other disorder: Secondary | ICD-10-CM

## 2018-04-12 DIAGNOSIS — Z3482 Encounter for supervision of other normal pregnancy, second trimester: Secondary | ICD-10-CM

## 2018-04-12 DIAGNOSIS — Z363 Encounter for antenatal screening for malformations: Secondary | ICD-10-CM

## 2018-04-12 DIAGNOSIS — Z3A16 16 weeks gestation of pregnancy: Secondary | ICD-10-CM

## 2018-04-12 LAB — POCT URINALYSIS DIPSTICK OB
GLUCOSE, UA: NEGATIVE
Ketones, UA: NEGATIVE
LEUKOCYTES UA: NEGATIVE
Nitrite, UA: NEGATIVE
POC,PROTEIN,UA: NEGATIVE
RBC UA: NEGATIVE

## 2018-04-12 NOTE — Progress Notes (Signed)
   LOW-RISK PREGNANCY VISIT Patient name: Patricia Richards MRN [id number]  Date of birth: 07/21/1990 Chief Complaint:   Routine Prenatal Visit ("heart problems" )  History of Present Illness:   Patricia Richards Patricia Richards is a 27 y.o. [phone] female at [redacted]w[redacted]d with an Estimated Date of Delivery: 09/26/18 being seen today for ongoing management of a low-risk pregnancy.  Today she reports 'heart problems' started years ago, heart races, sometimes 130s-140s, feels sob at times, sometimes chest pain, palpitations. Grandma's 'heart exploded', she is worried about the same. Reports absolutely on caffeine intake, doesn't smoke, quit cocaine 1y66m ago. Contractions: Not present. Vag. Bleeding: None.  Movement: Present. denies leaking of fluid. Review of Systems:   Pertinent items are noted in HPI Denies abnormal vaginal discharge w/ itching/odor/irritation, headaches, visual changes, shortness of breath, chest pain, abdominal pain, severe nausea/vomiting, or problems with urination or bowel movements unless otherwise stated above. Pertinent History Reviewed:  Reviewed past medical,surgical, social, obstetrical and family history.  Reviewed problem list, medications and allergies. Physical Assessment:   Vitals:   04/12/18 1539  BP: 121/70  Pulse: (!) 103  Weight: 155 lb (70.3 kg)  Body mass index is 27.46 kg/m.        Physical Examination:   General appearance: Well appearing, and in no distress  Mental status: Alert, oriented to person, place, and time  Skin: Warm & dry  Cardiovascular: Normal heart rate noted, RRR  Respiratory: Normal respiratory effort, no distress, LCTAB  Abdomen: Soft, gravid, nontender  Pelvic: Cervical exam deferred         Extremities: Edema: None  Fetal Status: Fetal Heart Rate (bpm): 150   Movement: Present    Results for orders placed or performed in visit on 04/12/18 (from the past 24 hour(s))  POC Urinalysis Dipstick OB   Collection Time: 04/12/18  3:41 PM    Result Value Ref Range   Color, UA     Clarity, UA     Glucose, UA Negative Negative   Bilirubin, UA     Ketones, UA neg    Spec Grav, UA     Blood, UA neg    pH, UA     POC,PROTEIN,UA Negative Negative, Trace, Small (1+), Moderate (2+), Large (3+), 4+   Urobilinogen, UA     Nitrite, UA neg    Leukocytes, UA Negative Negative   Appearance     Odor      Assessment & Plan:  1) Low-risk pregnancy [id number] at [redacted]w[redacted]d with an Estimated Date of Delivery: 09/26/18   2) Episodes of tachycardia, some sob, some cp, predates pregnancy, HRRR today, no evidence of acute distress. Ordered 48hr Holter and cardiology referral. Called [facility]  to make appt, message left.    Meds: No orders of the defined types were placed in this encounter.  Labs/procedures today: 2nd IT  Plan:  Continue routine obstetrical care   Reviewed: Preterm labor symptoms and general obstetric precautions including but not limited to vaginal bleeding, contractions, leaking of fluid and fetal movement were reviewed in detail with the patient.  All questions were answered  Follow-up: Return in about 2 weeks (around 04/26/2018) for LROB, [id number].  Orders Placed This Encounter  Procedures  . US OB Comp + 14 Wk  . INTEGRATED 2  . Ambulatory referral to Cardiology  . Holter monitor - 48 hour  . POC Urinalysis Dipstick OB   Roma Schanz CNM, [facility] 04/12/2018 4:24 PM

## 2018-04-12 NOTE — Patient Instructions (Signed)
Patricia Richards Drafts, I greatly value your feedback.  If you receive a survey following your visit with Patricia Richards today, we appreciate you taking the time to fill it out.  Thanks, Knute Neu, CNM, WHNP-BC   Second Trimester of Pregnancy The second trimester is from week 14 through week 27 (months 4 through 6). The second trimester is often a time when you feel your best. Your body has adjusted to being pregnant, and you begin to feel better physically. Usually, morning sickness has lessened or quit completely, you may have more energy, and you may have an increase in appetite. The second trimester is also a time when the fetus is growing rapidly. At the end of the sixth month, the fetus is about 9 inches long and weighs about 1 pounds. You will likely begin to feel the baby move (quickening) between 16 and 20 weeks of pregnancy. Body changes during your second trimester Your body continues to go through many changes during your second trimester. The changes vary from woman to woman.  Your weight will continue to increase. You will notice your lower abdomen bulging out.  You may begin to get stretch marks on your hips, abdomen, and breasts.  You may develop headaches that can be relieved by medicines. The medicines should be approved by your health care provider.  You may urinate more often because the fetus is pressing on your bladder.  You may develop or continue to have heartburn as a result of your pregnancy.  You may develop constipation because certain hormones are causing the muscles that push waste through your intestines to slow down.  You may develop hemorrhoids or swollen, bulging veins (varicose veins).  You may have back pain. This is caused by: ? Weight gain. ? Pregnancy hormones that are relaxing the joints in your pelvis. ? A shift in weight and the muscles that support your balance.  Your breasts will continue to grow and they will continue to become tender.  Your gums may  bleed and may be sensitive to brushing and flossing.  Dark spots or blotches (chloasma, mask of pregnancy) may develop on your face. This will likely fade after the baby is born.  A dark line from your belly button to the pubic area (linea nigra) may appear. This will likely fade after the baby is born.  You may have changes in your hair. These can include thickening of your hair, rapid growth, and changes in texture. Some women also have hair loss during or after pregnancy, or hair that feels dry or thin. Your hair will most likely return to normal after your baby is born.  What to expect at prenatal visits During a routine prenatal visit:  You will be weighed to make sure you and the fetus are growing normally.  Your blood pressure will be taken.  Your abdomen will be measured to track your baby's growth.  The fetal heartbeat will be listened to.  Any test results from the previous visit will be discussed.  Your health care provider may ask you:  How you are feeling.  If you are feeling the baby move.  If you have had any abnormal symptoms, such as leaking fluid, bleeding, severe headaches, or abdominal cramping.  If you are using any tobacco products, including cigarettes, chewing tobacco, and electronic cigarettes.  If you have any questions.  Other tests that may be performed during your second trimester include:  Blood tests that check for: ? Low iron levels (anemia). ?  High blood sugar that affects pregnant women (gestational diabetes) between 20 and 28 weeks. ? Rh antibodies. This is to check for a protein on red blood cells (Rh factor).  Urine tests to check for infections, diabetes, or protein in the urine.  An ultrasound to confirm the proper growth and development of the baby.  An amniocentesis to check for possible genetic problems.  Fetal screens for spina bifida and Down syndrome.  HIV (human immunodeficiency virus) testing. Routine prenatal testing  includes screening for HIV, unless you choose not to have this test.  Follow these instructions at home: Medicines  Follow your health care provider's instructions regarding medicine use. Specific medicines may be either safe or unsafe to take during pregnancy.  Take a prenatal vitamin that contains at least 600 micrograms (mcg) of folic acid.  If you develop constipation, try taking a stool softener if your health care provider approves. Eating and drinking  Eat a balanced diet that includes fresh fruits and vegetables, whole grains, good sources of protein such as meat, eggs, or tofu, and low-fat dairy. Your health care provider will help you determine the amount of weight gain that is right for you.  Avoid raw meat and uncooked cheese. These carry germs that can cause birth defects in the baby.  If you have low calcium intake from food, talk to your health care provider about whether you should take a daily calcium supplement.  Limit foods that are high in fat and processed sugars, such as fried and sweet foods.  To prevent constipation: ? Drink enough fluid to keep your urine clear or pale yellow. ? Eat foods that are high in fiber, such as fresh fruits and vegetables, whole grains, and beans. Activity  Exercise only as directed by your health care provider. Most women can continue their usual exercise routine during pregnancy. Try to exercise for 30 minutes at least 5 days a week. Stop exercising if you experience uterine contractions.  Avoid heavy lifting, wear low heel shoes, and practice good posture.  A sexual relationship may be continued unless your health care provider directs you otherwise. Relieving pain and discomfort  Wear a good support bra to prevent discomfort from breast tenderness.  Take warm sitz baths to soothe any pain or discomfort caused by hemorrhoids. Use hemorrhoid cream if your health care provider approves.  Rest with your legs elevated if you have  leg cramps or low back pain.  If you develop varicose veins, wear support hose. Elevate your feet for 15 minutes, 3-4 times a day. Limit salt in your diet. Prenatal Care  Write down your questions. Take them to your prenatal visits.  Keep all your prenatal visits as told by your health care provider. This is important. Safety  Wear your seat belt at all times when driving.  Make a list of emergency phone numbers, including numbers for family, friends, the hospital, and police and fire departments. General instructions  Ask your health care provider for a referral to a local prenatal education class. Begin classes no later than the beginning of month 6 of your pregnancy.  Ask for help if you have counseling or nutritional needs during pregnancy. Your health care provider can offer advice or refer you to specialists for help with various needs.  Do not use hot tubs, steam rooms, or saunas.  Do not douche or use tampons or scented sanitary pads.  Do not cross your legs for long periods of time.  Avoid cat litter boxes and  soil used by cats. These carry germs that can cause birth defects in the baby and possibly loss of the fetus by miscarriage or stillbirth.  Avoid all smoking, herbs, alcohol, and unprescribed drugs. Chemicals in these products can affect the formation and growth of the baby.  Do not use any products that contain nicotine or tobacco, such as cigarettes and e-cigarettes. If you need help quitting, ask your health care provider.  Visit your dentist if you have not gone yet during your pregnancy. Use a soft toothbrush to brush your teeth and be gentle when you floss. Contact a health care provider if:  You have dizziness.  You have mild pelvic cramps, pelvic pressure, or nagging pain in the abdominal area.  You have persistent nausea, vomiting, or diarrhea.  You have a bad smelling vaginal discharge.  You have pain when you urinate. Get help right away if:  You  have a fever.  You are leaking fluid from your vagina.  You have spotting or bleeding from your vagina.  You have severe abdominal cramping or pain.  You have rapid weight gain or weight loss.  You have shortness of breath with chest pain.  You notice sudden or extreme swelling of your face, hands, ankles, feet, or legs.  You have not felt your baby move in over an hour.  You have severe headaches that do not go away when you take medicine.  You have vision changes. Summary  The second trimester is from week 14 through week 27 (months 4 through 6). It is also a time when the fetus is growing rapidly.  Your body goes through many changes during pregnancy. The changes vary from woman to woman.  Avoid all smoking, herbs, alcohol, and unprescribed drugs. These chemicals affect the formation and growth your baby.  Do not use any tobacco products, such as cigarettes, chewing tobacco, and e-cigarettes. If you need help quitting, ask your health care provider.  Contact your health care provider if you have any questions. Keep all prenatal visits as told by your health care provider. This is important. This information is not intended to replace advice given to you by your health care provider. Make sure you discuss any questions you have with your health care provider. Document Released: 04/27/2001 Document Revised: 10/09/2015 Document Reviewed: 07/04/2012 Elsevier Interactive Patient Education  2017 [employer].

## 2018-04-15 LAB — INTEGRATED 2
ADSF: 0.68
AFP MARKER: 19.3 ng/mL
AFP MOM: 0.64
Crown Rump Length: 69.7 mm
DIA MOM: 0.8
DIA Value: 132.9 pg/mL
Estriol, Unconjugated: 0.57 ng/mL
GESTATIONAL AGE: 15.9 wk
Gest. Age on Collection Date: 13 weeks
MATERNAL AGE AT EDD: 25.5 a
NUCHAL TRANSLUCENCY (NT): 1.5 mm
Nuchal Translucency MoM: 0.95
Number of Fetuses: 1
PAPP-A MOM: 0.61
PAPP-A VALUE: 680.5 ng/mL
Test Results:: NEGATIVE
WEIGHT: 154 [lb_av]
Weight: 154 [lb_av]
hCG MoM: 0.47
hCG Value: 17.8 IU/mL

## 2018-04-17 ENCOUNTER — Telehealth: Payer: Self-pay | Admitting: Obstetrics & Gynecology

## 2018-04-17 ENCOUNTER — Other Ambulatory Visit: Payer: Self-pay | Admitting: Obstetrics & Gynecology

## 2018-04-17 ENCOUNTER — Ambulatory Visit (HOSPITAL_COMMUNITY)
Admission: RE | Admit: 2018-04-17 | Discharge: 2018-04-17 | Disposition: A | Discharge status: Home / Self Care | Payer: Medicaid Other | Source: Ambulatory Visit | Attending: Women's Health | Admitting: Women's Health

## 2018-04-17 DIAGNOSIS — R000 Tachycardia, unspecified: Secondary | ICD-10-CM | POA: Insufficient documentation

## 2018-04-17 DIAGNOSIS — Z3A16 16 weeks gestation of pregnancy: Secondary | ICD-10-CM | POA: Diagnosis present

## 2018-04-17 NOTE — Telephone Encounter (Signed)
NEEDS TO GET A REFILL ON MEDS/

## 2018-04-17 NOTE — Telephone Encounter (Signed)
Pt called stating that she needs a refill on her klonopin by Dec 13. Patient has appt on 12/12. Advised that she can discuss with provider that day for the refill.

## 2018-04-20 ENCOUNTER — Encounter: Payer: Self-pay | Admitting: Women's Health

## 2018-04-20 DIAGNOSIS — R000 Tachycardia, unspecified: Secondary | ICD-10-CM | POA: Insufficient documentation

## 2018-04-27 ENCOUNTER — Encounter: Payer: Self-pay | Admitting: Women's Health

## 2018-04-27 ENCOUNTER — Ambulatory Visit (INDEPENDENT_AMBULATORY_CARE_PROVIDER_SITE_OTHER): Payer: Medicaid Other

## 2018-04-27 ENCOUNTER — Ambulatory Visit (INDEPENDENT_AMBULATORY_CARE_PROVIDER_SITE_OTHER): Payer: Medicaid Other | Admitting: Women's Health

## 2018-04-27 VITALS — BP 114/68 | HR 90 | Wt 154.8 lb

## 2018-04-27 DIAGNOSIS — Z3A18 18 weeks gestation of pregnancy: Secondary | ICD-10-CM

## 2018-04-27 DIAGNOSIS — Z1389 Encounter for screening for other disorder: Secondary | ICD-10-CM

## 2018-04-27 DIAGNOSIS — Z363 Encounter for antenatal screening for malformations: Secondary | ICD-10-CM

## 2018-04-27 DIAGNOSIS — Z3482 Encounter for supervision of other normal pregnancy, second trimester: Secondary | ICD-10-CM

## 2018-04-27 DIAGNOSIS — Z331 Pregnant state, incidental: Secondary | ICD-10-CM

## 2018-04-27 DIAGNOSIS — O09899 Supervision of other high risk pregnancies, unspecified trimester: Secondary | ICD-10-CM

## 2018-04-27 LAB — POCT URINALYSIS DIPSTICK OB
Blood, UA: NEGATIVE
Glucose, UA: NEGATIVE
KETONES UA: NEGATIVE
LEUKOCYTES UA: NEGATIVE
NITRITE UA: NEGATIVE
POC,PROTEIN,UA: NEGATIVE

## 2018-04-27 MED ORDER — ESCITALOPRAM OXALATE 10 MG PO TABS: 10.0000 mg | ORAL_TABLET | Freq: Every day | ORAL | 6 refills | Status: DC

## 2018-04-27 NOTE — Progress Notes (Signed)
LOW-RISK PREGNANCY VISIT Patient name: Patricia Richards MRN [id number]  Date of birth: 05-Dec-1990 Chief Complaint:   Routine Prenatal Visit (ultrasound)  History of Present Illness:   Patricia Richards Patricia Richards is a 27 y.o. [phone] female at [redacted]w[redacted]d with an Estimated Date of Delivery: 09/26/18 being seen today for ongoing management of a low-risk pregnancy.  Today she reports dep/anx- feels like she needs to be on meds for dep. On Klonopin for anxiety. Worried about the tachycardia, has appt w/ cardiology on 12/31. Contractions: Not present.  .  Movement: Absent. denies leaking of fluid. Review of Systems:   Pertinent items are noted in HPI Denies abnormal vaginal discharge w/ itching/odor/irritation, headaches, visual changes, shortness of breath, chest pain, abdominal pain, severe nausea/vomiting, or problems with urination or bowel movements unless otherwise stated above. Pertinent History Reviewed:  Reviewed past medical,surgical, social, obstetrical and family history.  Reviewed problem list, medications and allergies. Physical Assessment:   Vitals:   04/27/18 1455  BP: 114/68  Pulse: 90  Weight: 154 lb 12.8 oz (70.2 kg)  Body mass index is 27.42 kg/m.        Physical Examination:   General appearance: Well appearing, and in no distress  Mental status: Alert, oriented to person, place, and time  Skin: Warm & dry  Cardiovascular: Normal heart rate noted  Respiratory: Normal respiratory effort, no distress  Abdomen: Soft, gravid, nontender  Pelvic: Cervical exam deferred         Extremities: Edema: None  Fetal Status: Fetal Heart Rate (bpm): 161 u/s   Movement: Absent    [place] 41+9 wks,cephalic,anterior placenta gr 0,svp of fluid 5.7 cm,normal ovaries bilat,bilat choroid plexus cysts, right simple CPC 5 x 2 x 4 mm,complex left CPC 7.5 x 7 x 6.7 mm,fhr 161 bpm,cx 4.6 cm,EFW 231 g 42%,anatomy complete  Results for orders placed or performed in visit on 04/27/18 (from the past 24  hour(s))  POC Urinalysis Dipstick OB   Collection Time: 04/27/18  2:58 PM  Result Value Ref Range   Color, UA     Clarity, UA     Glucose, UA Negative Negative   Bilirubin, UA     Ketones, UA neg    Spec Grav, UA     Blood, UA neg    pH, UA     POC,PROTEIN,UA Negative Negative, Trace, Small (1+), Moderate (2+), Large (3+), 4+   Urobilinogen, UA     Nitrite, UA neg    Leukocytes, UA Negative Negative   Appearance     Odor      Assessment & Plan:  1) Low-risk pregnancy [id number] at [redacted]w[redacted]d with an Estimated Date of Delivery: 09/26/18   2) Dep/anx, rx Lexapro, continue Klonopin  3) Tachycardia> keep appt w/ cardiology 12/31  4) Bilateral CPC on u/s today>nt/it neg, discussed, gave info, repeat @ 28wks   Meds:  Meds ordered this encounter  Medications  . escitalopram (LEXAPRO) 10 MG tablet    Sig: Take 1 tablet (10 mg total) by mouth daily.    Dispense:  30 tablet    Refill:  6    Order Specific Question:   Supervising Provider    Answer:   Florian Buff [[id number]]   Labs/procedures today: anatomy u/s  Plan:  Continue routine obstetrical care   Reviewed: Preterm labor symptoms and general obstetric precautions including but not limited to vaginal bleeding, contractions, leaking of fluid and fetal movement were reviewed in detail with the patient.  All questions  were answered  Follow-up: Return in about 4 weeks (around 05/25/2018) for [employer].  Orders Placed This Encounter  Procedures  . POC Urinalysis Dipstick OB   Roma Schanz CNM, [facility] 04/27/2018 5:06 PM

## 2018-04-27 NOTE — Patient Instructions (Signed)
Patricia Richards, I greatly value your feedback.  If you receive a survey following your visit with [place] today, we appreciate you taking the time to fill it out.  Thanks, Knute Neu, CNM, WHNP-BC   Second Trimester of Pregnancy The second trimester is from week 14 through week 27 (months 4 through 6). The second trimester is often a time when you feel your best. Your body has adjusted to being pregnant, and you begin to feel better physically. Usually, morning sickness has lessened or quit completely, you may have more energy, and you may have an increase in appetite. The second trimester is also a time when the fetus is growing rapidly. At the end of the sixth month, the fetus is about 9 inches long and weighs about 1 pounds. You will likely begin to feel the baby move (quickening) between 16 and 20 weeks of pregnancy. Body changes during your second trimester Your body continues to go through many changes during your second trimester. The changes vary from woman to woman.  Your weight will continue to increase. You will notice your lower abdomen bulging out.  You may begin to get stretch marks on your hips, abdomen, and breasts.  You may develop headaches that can be relieved by medicines. The medicines should be approved by your health care provider.  You may urinate more often because the fetus is pressing on your bladder.  You may develop or continue to have heartburn as a result of your pregnancy.  You may develop constipation because certain hormones are causing the muscles that push waste through your intestines to slow down.  You may develop hemorrhoids or swollen, bulging veins (varicose veins).  You may have back pain. This is caused by: ? Weight gain. ? Pregnancy hormones that are relaxing the joints in your pelvis. ? A shift in weight and the muscles that support your balance.  Your breasts will continue to grow and they will continue to become tender.  Your gums may  bleed and may be sensitive to brushing and flossing.  Dark spots or blotches (chloasma, mask of pregnancy) may develop on your face. This will likely fade after the baby is born.  A dark line from your belly button to the pubic area (linea nigra) may appear. This will likely fade after the baby is born.  You may have changes in your hair. These can include thickening of your hair, rapid growth, and changes in texture. Some women also have hair loss during or after pregnancy, or hair that feels dry or thin. Your hair will most likely return to normal after your baby is born.  What to expect at prenatal visits During a routine prenatal visit:  You will be weighed to make sure you and the fetus are growing normally.  Your blood pressure will be taken.  Your abdomen will be measured to track your baby's growth.  The fetal heartbeat will be listened to.  Any test results from the previous visit will be discussed.  Your health care provider may ask you:  How you are feeling.  If you are feeling the baby move.  If you have had any abnormal symptoms, such as leaking fluid, bleeding, severe headaches, or abdominal cramping.  If you are using any tobacco products, including cigarettes, chewing tobacco, and electronic cigarettes.  If you have any questions.  Other tests that may be performed during your second trimester include:  Blood tests that check for: ? Low iron levels (anemia). ?  High blood sugar that affects pregnant women (gestational diabetes) between 20 and 28 weeks. ? Rh antibodies. This is to check for a protein on red blood cells (Rh factor).  Urine tests to check for infections, diabetes, or protein in the urine.  An ultrasound to confirm the proper growth and development of the baby.  An amniocentesis to check for possible genetic problems.  Fetal screens for spina bifida and Down syndrome.  HIV (human immunodeficiency virus) testing. Routine prenatal testing  includes screening for HIV, unless you choose not to have this test.  Follow these instructions at home: Medicines  Follow your health care provider's instructions regarding medicine use. Specific medicines may be either safe or unsafe to take during pregnancy.  Take a prenatal vitamin that contains at least 600 micrograms (mcg) of folic acid.  If you develop constipation, try taking a stool softener if your health care provider approves. Eating and drinking  Eat a balanced diet that includes fresh fruits and vegetables, whole grains, good sources of protein such as meat, eggs, or tofu, and low-fat dairy. Your health care provider will help you determine the amount of weight gain that is right for you.  Avoid raw meat and uncooked cheese. These carry germs that can cause birth defects in the baby.  If you have low calcium intake from food, talk to your health care provider about whether you should take a daily calcium supplement.  Limit foods that are high in fat and processed sugars, such as fried and sweet foods.  To prevent constipation: ? Drink enough fluid to keep your urine clear or pale yellow. ? Eat foods that are high in fiber, such as fresh fruits and vegetables, whole grains, and beans. Activity  Exercise only as directed by your health care provider. Most women can continue their usual exercise routine during pregnancy. Try to exercise for 30 minutes at least 5 days a week. Stop exercising if you experience uterine contractions.  Avoid heavy lifting, wear low heel shoes, and practice good posture.  A sexual relationship may be continued unless your health care provider directs you otherwise. Relieving pain and discomfort  Wear a good support bra to prevent discomfort from breast tenderness.  Take warm sitz baths to soothe any pain or discomfort caused by hemorrhoids. Use hemorrhoid cream if your health care provider approves.  Rest with your legs elevated if you have  leg cramps or low back pain.  If you develop varicose veins, wear support hose. Elevate your feet for 15 minutes, 3-4 times a day. Limit salt in your diet. Prenatal Care  Write down your questions. Take them to your prenatal visits.  Keep all your prenatal visits as told by your health care provider. This is important. Safety  Wear your seat belt at all times when driving.  Make a list of emergency phone numbers, including numbers for family, friends, the hospital, and police and fire departments. General instructions  Ask your health care provider for a referral to a local prenatal education class. Begin classes no later than the beginning of month 6 of your pregnancy.  Ask for help if you have counseling or nutritional needs during pregnancy. Your health care provider can offer advice or refer you to specialists for help with various needs.  Do not use hot tubs, steam rooms, or saunas.  Do not douche or use tampons or scented sanitary pads.  Do not cross your legs for long periods of time.  Avoid cat litter boxes and  soil used by cats. These carry germs that can cause birth defects in the baby and possibly loss of the fetus by miscarriage or stillbirth.  Avoid all smoking, herbs, alcohol, and unprescribed drugs. Chemicals in these products can affect the formation and growth of the baby.  Do not use any products that contain nicotine or tobacco, such as cigarettes and e-cigarettes. If you need help quitting, ask your health care provider.  Visit your dentist if you have not gone yet during your pregnancy. Use a soft toothbrush to brush your teeth and be gentle when you floss. Contact a health care provider if:  You have dizziness.  You have mild pelvic cramps, pelvic pressure, or nagging pain in the abdominal area.  You have persistent nausea, vomiting, or diarrhea.  You have a bad smelling vaginal discharge.  You have pain when you urinate. Get help right away if:  You  have a fever.  You are leaking fluid from your vagina.  You have spotting or bleeding from your vagina.  You have severe abdominal cramping or pain.  You have rapid weight gain or weight loss.  You have shortness of breath with chest pain.  You notice sudden or extreme swelling of your face, hands, ankles, feet, or legs.  You have not felt your baby move in over an hour.  You have severe headaches that do not go away when you take medicine.  You have vision changes. Summary  The second trimester is from week 14 through week 27 (months 4 through 6). It is also a time when the fetus is growing rapidly.  Your body goes through many changes during pregnancy. The changes vary from woman to woman.  Avoid all smoking, herbs, alcohol, and unprescribed drugs. These chemicals affect the formation and growth your baby.  Do not use any tobacco products, such as cigarettes, chewing tobacco, and e-cigarettes. If you need help quitting, ask your health care provider.  Contact your health care provider if you have any questions. Keep all prenatal visits as told by your health care provider. This is important. This information is not intended to replace advice given to you by your health care provider. Make sure you discuss any questions you have with your health care provider. Document Released: 04/27/2001 Document Revised: 10/09/2015 Document Reviewed: 07/04/2012 Elsevier Interactive Patient Education  2017 [employer].

## 2018-04-27 NOTE — Progress Notes (Signed)
[place] 25+4 wks,cephalic,anterior placenta gr 0,svp of fluid 5.7 cm,normal ovaries bilat,bilat choroid plexus cysts, right simple CPC 5 x 2 x 4 mm,complex left CPC 7.5 x 7 x 6.7 mm,fhr 161 bpm,cx 4.6 cm,EFW 231 g 42%,anatomy complete

## 2018-05-16 ENCOUNTER — Encounter: Payer: Self-pay | Admitting: Internal Medicine

## 2018-05-16 ENCOUNTER — Ambulatory Visit (INDEPENDENT_AMBULATORY_CARE_PROVIDER_SITE_OTHER): Payer: Medicaid Other | Admitting: Internal Medicine

## 2018-05-16 VITALS — BP 100/54 | HR 100 | Ht 63.0 in | Wt 155.0 lb

## 2018-05-16 DIAGNOSIS — R0789 Other chest pain: Secondary | ICD-10-CM | POA: Diagnosis not present

## 2018-05-16 DIAGNOSIS — R002 Palpitations: Secondary | ICD-10-CM

## 2018-05-16 NOTE — Patient Instructions (Signed)
Medication Instructions:  Your physician recommends that you continue on your current medications as directed. Please refer to the Current Medication list given to you today.  If you need a refill on your cardiac medications before your next appointment, please call your pharmacy.   Lab work: NONE  If you have labs (blood work) drawn today and your tests are completely normal, you will receive your results only by: Marland Kitchen MyChart Message (if you have MyChart) OR . A paper copy in the mail If you have any lab test that is abnormal or we need to change your treatment, we will call you to review the results.  Testing/Procedures: NONE   Follow-Up: At [facility], you and your health needs are our priority.  As part of our continuing mission to provide you with exceptional heart care, we have created designated Provider Care Teams.  These Care Teams include your primary Cardiologist (physician) and Advanced Practice Providers (APPs -  Physician Assistants and Nurse Practitioners) who all work together to provide you with the care you need, when you need it. You will need a follow up appointment To Be Determined  .  Please call our office 2 months in advance to schedule this appointment.  You may see Dorris Carnes, MD or one of the following Advanced Practice Providers on your designated Care Team:   Bernerd Pho, PA-C [facility]) . Ermalinda Barrios, PA-C ([place])  Any Other Special Instructions Will Be Listed Below (If Applicable). Thank you for choosing [facility]!

## 2018-05-16 NOTE — Progress Notes (Addendum)
Cardiology Office Note   Date:  05/16/2018   ID:  Patricia Richards, DOB Feb 01, 1991, MRN [id number]  PCP:  [facility] [place] [facility]  Cardiologist:   Dorris Carnes, MD   Patient referred for palpitaitons       History of Present Illness: Patricia Richards is a 27 y.o. female who is currently pregnant [phone]) who is now about [redacted] wks pregnant   Went to [facility]   Patient told the she had heart problems years ago  That her heart t races  Sometimes has CP   Grandma's heart "exploded"   She is worried     Note quit cocaine 1 year ago  Quit EtOH (rehaab0  Pt has had heart racing for years    Occurs occasionally    Worse this pregnancy   Feels like going to pass out    Has passed out in past.   Pt also complains of CP    Intermitt   Worse this pregnancy     Does burp a lot   Attributes to anxiety    Patient says she wore holter monitor for 2 days   While wearing monitor had to take one child to ED   Felt like going to pass out   Had to ben over     Takes HR with BP cufft   With BP cuff will be 100s to 130s     Pt pregnancy things are worse   .  Pt says her heart will race worse after eating red meat, chocolate  Used to do drugs   Has not for 1 year        Current Meds  Medication Sig  . acetaminophen (TYLENOL) 325 MG tablet Take 650 mg by mouth every 6 (six) hours as needed.  Marland Kitchen acyclovir (ZOVIRAX) 400 MG tablet   . clonazePAM (KLONOPIN) 1 MG tablet TAKE (1) TABLET BY MOUTH THREE TIMES DAILY AS NEEDED FOR ANXIETY.  Marland Kitchen escitalopram (LEXAPRO) 10 MG tablet Take 1 tablet (10 mg total) by mouth daily.  . famotidine (PEPCID) 20 MG tablet Take 20 mg by mouth 2 (two) times daily.     Allergies:   Metronidazole; Tramadol; and Toradol [ketorolac tromethamine]   Past Medical History:  Diagnosis Date  . 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 Anemia   . Bacterial infection   . Bipolar 1 disorder ([place])   . Depression    on  meds now, doing well  . Herpes   . Infection    UTI  . Kidney stones   . 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 Panic attacks   . Pregnant   . Urinary tract infection    gets them frequently  . Vaginal Pap smear, abnormal     Past Surgical History:  Procedure Laterality Date  . DILATION AND EVACUATION  11/28/2010     Social History:  The patient  reports that she quit smoking about 4 years ago. Her smoking use included cigarettes. She has never used smokeless tobacco. She reports that she does not drink alcohol or use drugs.   Family History:  The patient's family history includes Bipolar disorder in her cousin; Cancer in her father and maternal grandmother; Cerebral palsy in her mother; Down syndrome in her cousin; Heart attack in her paternal grandmother; Hypertension in her father; Mental illness in her father and  maternal grandmother; Other in her brother; Stroke in her maternal grandmother.    ROS:  Please see the history of present illness. All other systems are reviewed and  Negative to the above problem except as noted.    PHYSICAL EXAM: VS:  BP (!) 102/56 (BP Location: Left Arm)   Pulse 100   Ht 5\' 3"  (1.6 m)   Wt 155 lb (70.3 kg)   LMP 12/20/2017   SpO2 98%   BMI 27.46 kg/m   GEN: Well nourished, well developed, in no acute distress  HEENT: normal  Neck: no JVD, carotid bruits, or masses Cardiac: RRR; no murmurs, rubs, or gallops,no edema  Respiratory:  clear to auscultation bilaterally, normal work of breathing GI: soft, nontender, nondistended, + BS  No hepatomegaly  MS: no deformity Moving all extremities   Skin: warm and dry, no rash  Multiple tattoos   Neuro:  Strength and sensation are intact Psych: euthymic mood, full affect   EKG:  EKG is not ordered today.  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  The patient does not have concerns regarding medicines.to keep muscles toned   2  Chest pain   Atypical    Probably musculoskeletal    Can be exaggerated if patients BP is low  3   Substance abuse   Congratulated her on staying offf EtOH and cocaine   Will be available as needed    No definte follow otherwise planned  Signed, Dorris Carnes, MD  05/16/2018 9:29 AM    [address], [place], [place]  [address] Phone: [phone]; Fax: [phone]

## 2018-05-17 NOTE — L&D Delivery Note (Signed)
OB/GYN Faculty Practice Delivery Note  Symphonie Schneiderman Dalaya Suppa is a 28 y.o. [id number] s/p SVD at [redacted]w[redacted]d. She was admitted for induction of labor for A2GDM.   ROM: 8h 36m with clear fluid GBS Status: negative  Maximum Maternal Temperature: Temp (24hrs), Avg:97.8 F (36.6 C), Min:97.6 F (36.4 C), Max:98 F (36.7 C)  Labor Progress: . Induction started with pitocin . Epidural placement . AROM clear fluid  Delivery Date/Time: 09/19/18 at 2104 Delivery: Called to room and patient was complete and pushing. Head delivered ROA. No nuchal cord present. Shoulder and body delivered in usual fashion. Infant with spontaneous cry, placed on mother's abdomen, dried and stimulated. Cord clamped x 2 after 1-minute delay, and cut by father of baby. Cord blood drawn. Placenta delivered spontaneously with gentle cord traction. Fundus firm with massage and Pitocin. Labia, perineum, vagina, and cervix inspected inspected with no lacerations.   Placenta: spontaneous, intact, 3-vessel cord (to be discarded) Complications: none immediate Lacerations: none EBL: 250cc Analgesia: epidural   Postpartum Planning [x]  message to sent to schedule follow-up  [x]  vaccines UTD  Infant: Vigorous infant  APGARs 9, 9  weight pending but appears AGA  Laurel S. Juleen China, DO OB/GYN Fellow, Faculty Practice

## 2018-05-23 ENCOUNTER — Other Ambulatory Visit: Payer: Self-pay | Admitting: Obstetrics & Gynecology

## 2018-05-23 ENCOUNTER — Telehealth: Payer: Self-pay | Admitting: Obstetrics & Gynecology

## 2018-05-23 MED ORDER — CLONAZEPAM 1 MG PO TABS: ORAL_TABLET | ORAL | 0 refills | Status: DC

## 2018-05-23 NOTE — Telephone Encounter (Signed)
Needs to get a refill on Klonopin) [employer]

## 2018-05-25 ENCOUNTER — Encounter: Payer: Self-pay | Admitting: Advanced Practice Midwife

## 2018-05-25 ENCOUNTER — Ambulatory Visit (INDEPENDENT_AMBULATORY_CARE_PROVIDER_SITE_OTHER): Payer: Medicaid Other | Admitting: Advanced Practice Midwife

## 2018-05-25 ENCOUNTER — Encounter (INDEPENDENT_AMBULATORY_CARE_PROVIDER_SITE_OTHER): Payer: Self-pay

## 2018-05-25 VITALS — BP 115/62 | HR 98

## 2018-05-25 DIAGNOSIS — Z3482 Encounter for supervision of other normal pregnancy, second trimester: Secondary | ICD-10-CM

## 2018-05-25 DIAGNOSIS — Z331 Pregnant state, incidental: Secondary | ICD-10-CM

## 2018-05-25 DIAGNOSIS — Z1389 Encounter for screening for other disorder: Secondary | ICD-10-CM

## 2018-05-25 DIAGNOSIS — Z3A22 22 weeks gestation of pregnancy: Secondary | ICD-10-CM

## 2018-05-25 LAB — POCT URINALYSIS DIPSTICK OB
Blood, UA: NEGATIVE
Glucose, UA: NEGATIVE
KETONES UA: NEGATIVE
Leukocytes, UA: NEGATIVE
Nitrite, UA: NEGATIVE
POC,PROTEIN,UA: NEGATIVE

## 2018-05-25 MED ORDER — PNV PRENATAL PLUS MULTIVITAMIN 27-1 MG PO TABS: 1.0000 | ORAL_TABLET | Freq: Every day | ORAL | 11 refills | Status: DC

## 2018-05-25 NOTE — Progress Notes (Signed)
  [id number] [redacted]w[redacted]d Estimated Date of Delivery: 09/26/18  Blood pressure 115/62, pulse 98, last menstrual period 12/20/2017, not currently breastfeeding.   BP weight and urine results all reviewed and noted.  Please refer to the obstetrical flow sheet for the fundal height and fetal heart rate documentation:  Patient reports good fetal movement, denies any bleeding and no rupture of membranes symptoms or regular contractions. Patient is without complaints. All questions were answered.   Physical Assessment:   Vitals:   05/25/18 1451  BP: 115/62  Pulse: 98  There is no height or weight on file to calculate BMI.        Physical Examination:   General appearance: Well appearing, and in no distress  Mental status: Alert, oriented to person, place, and time  Skin: Warm & dry  Cardiovascular: Normal heart rate noted  Respiratory: Normal respiratory effort, no distress  Abdomen: Soft, gravid, nontender  Pelvic: Cervical exam deferred         Extremities: Edema: None  Fetal Status:          Results for orders placed or performed in visit on 05/25/18 (from the past 24 hour(s))  POC Urinalysis Dipstick OB   Collection Time: 05/25/18  2:56 PM  Result Value Ref Range   Color, UA     Clarity, UA     Glucose, UA Negative Negative   Bilirubin, UA     Ketones, UA neg    Spec Grav, UA     Blood, UA neg    pH, UA     POC,PROTEIN,UA Negative Negative, Trace, Small (1+), Moderate (2+), Large (3+), 4+   Urobilinogen, UA     Nitrite, UA neg    Leukocytes, UA Negative Negative   Appearance     Odor       Orders Placed This Encounter  Procedures  . POC Urinalysis Dipstick OB    Plan:  Continued routine obstetrical care,   Return in about 4 weeks (around 06/22/2018) for PN2/LROB.

## 2018-06-15 ENCOUNTER — Telehealth: Payer: Self-pay | Admitting: *Deleted

## 2018-06-15 NOTE — Telephone Encounter (Signed)
Pt called back stating she wasn't out of Clonazepam yet; she won't be out until 06/23/2018 but didn't want to end up "running out". Pt has an appt 2/6 with Manus Gunning, CNM and I advised to ask for refill then instead of today. Pt voiced understanding. [place]

## 2018-06-15 NOTE — Telephone Encounter (Signed)
Voice mail not set up @ 2:27 pm. Pt called requesting refill on prenatal vit and Clonazepam. Pt has refills on prenatal vit and Clonazepam was refilled 05/23/2018 # 90 tabs. [place]

## 2018-06-21 ENCOUNTER — Other Ambulatory Visit: Payer: Self-pay | Admitting: Obstetrics and Gynecology

## 2018-06-21 DIAGNOSIS — O350XX0 Maternal care for (suspected) central nervous system malformation in fetus, not applicable or unspecified: Secondary | ICD-10-CM

## 2018-06-21 DIAGNOSIS — O3503X0 Maternal care for (suspected) central nervous system malformation or damage in fetus, choroid plexus cysts, not applicable or unspecified: Secondary | ICD-10-CM

## 2018-06-22 ENCOUNTER — Ambulatory Visit (INDEPENDENT_AMBULATORY_CARE_PROVIDER_SITE_OTHER): Payer: Medicaid Other | Admitting: Advanced Practice Midwife

## 2018-06-22 ENCOUNTER — Ambulatory Visit (INDEPENDENT_AMBULATORY_CARE_PROVIDER_SITE_OTHER): Payer: Medicaid Other

## 2018-06-22 ENCOUNTER — Other Ambulatory Visit: Payer: Medicaid Other

## 2018-06-22 VITALS — BP 127/73 | HR 119 | Wt 158.0 lb

## 2018-06-22 DIAGNOSIS — Z331 Pregnant state, incidental: Secondary | ICD-10-CM

## 2018-06-22 DIAGNOSIS — O09899 Supervision of other high risk pregnancies, unspecified trimester: Secondary | ICD-10-CM

## 2018-06-22 DIAGNOSIS — Z1389 Encounter for screening for other disorder: Secondary | ICD-10-CM

## 2018-06-22 DIAGNOSIS — Z3482 Encounter for supervision of other normal pregnancy, second trimester: Secondary | ICD-10-CM

## 2018-06-22 DIAGNOSIS — O3503X0 Maternal care for (suspected) central nervous system malformation or damage in fetus, choroid plexus cysts, not applicable or unspecified: Secondary | ICD-10-CM

## 2018-06-22 DIAGNOSIS — Z3A26 26 weeks gestation of pregnancy: Secondary | ICD-10-CM

## 2018-06-22 DIAGNOSIS — Z3402 Encounter for supervision of normal first pregnancy, second trimester: Secondary | ICD-10-CM

## 2018-06-22 DIAGNOSIS — O350XX0 Maternal care for (suspected) central nervous system malformation in fetus, not applicable or unspecified: Secondary | ICD-10-CM | POA: Diagnosis not present

## 2018-06-22 LAB — POCT URINALYSIS DIPSTICK OB
Blood, UA: NEGATIVE
Glucose, UA: NEGATIVE
Ketones, UA: NEGATIVE
Nitrite, UA: NEGATIVE
POC,PROTEIN,UA: NEGATIVE

## 2018-06-22 MED ORDER — CLONAZEPAM 1 MG PO TABS: ORAL_TABLET | ORAL | 0 refills | Status: DC

## 2018-06-22 MED ORDER — LIDOCAINE VISCOUS HCL 2 % MT SOLN: 5.0000 mL | OROMUCOSAL | 0 refills | Status: DC | PRN

## 2018-06-22 NOTE — Progress Notes (Addendum)
  [id number] [redacted]w[redacted]d Estimated Date of Delivery: 09/26/18  Blood pressure 127/73, pulse (!) 119, weight 158 lb (71.7 kg), last menstrual period 12/20/2017, not currently breastfeeding.   BP weight and urine results all reviewed and noted.  Please refer to the obstetrical flow sheet for the fundal height and fetal heart rate documentation:  Patient reports good fetal movement, denies any bleeding and no rupture of membranes symptoms or regular contractions. Patient has had a sore throat for 2 days, no other sx.    Had several syncopal epidsodes.  Sounds like POTS, tips given.  All questions were answered.   Physical Assessment:   Vitals:   06/22/18 1002  BP: 127/73  Pulse: (!) 119  Weight: 158 lb (71.7 kg)  Body mass index is 27.99 kg/m.        Physical Examination:   General appearance: Well appearing, and in no distress  Mental status: Alert, oriented to person, place, and time  Skin: Warm & dry  Cardiovascular: Normal heart rate noted  Respiratory: Normal respiratory effort, no distress  Abdomen: Soft, gravid, nontender  Pelvic: Cervical exam deferred         Extremities: Edema: None  Fetal Status:     Movement: Present   [place] 75+7 wks,cephalic,anterior placenta gr 0,normal ovaries bilat,cx 4.2 cm,afi 20 cm,resolved CPC,FHR 164 bpm,efw 989 g 61% Results for orders placed or performed in visit on 06/22/18 (from the past 24 hour(s))  POC Urinalysis Dipstick OB   Collection Time: 06/22/18 10:05 AM  Result Value Ref Range   Color, UA     Clarity, UA     Glucose, UA Negative Negative   Bilirubin, UA     Ketones, UA neg    Spec Grav, UA     Blood, UA neg    pH, UA     POC,PROTEIN,UA Negative Negative, Trace, Small (1+), Moderate (2+), Large (3+), 4+   Urobilinogen, UA     Nitrite, UA neg    Leukocytes, UA Small (1+) (A) Negative   Appearance     Odor       Orders Placed This Encounter  Procedures  . POC Urinalysis Dipstick OB    Plan:  Continued routine obstetrical  care, PN2 today.30 days of Klonopin refilled  Rx lidocaine, abx discussed for next week is still symtomatic   Return in about 4 weeks (around 07/20/2018) for LROB.

## 2018-06-22 NOTE — Progress Notes (Signed)
[place] 38+8 wks,cephalic,anterior placenta gr 0,normal ovaries bilat,cx 4.2 cm,afi 20 cm,resolved CPC,FHR 164 bpm,efw 989 g 61%

## 2018-06-23 ENCOUNTER — Encounter: Payer: Self-pay | Admitting: Advanced Practice Midwife

## 2018-06-23 ENCOUNTER — Telehealth: Payer: Self-pay | Admitting: *Deleted

## 2018-06-23 DIAGNOSIS — O24419 Gestational diabetes mellitus in pregnancy, unspecified control: Secondary | ICD-10-CM | POA: Insufficient documentation

## 2018-06-23 DIAGNOSIS — O24410 Gestational diabetes mellitus in pregnancy, diet controlled: Secondary | ICD-10-CM

## 2018-06-23 LAB — CBC
Hematocrit: 33.7 % — ABNORMAL LOW (ref 34.0–46.6)
Hemoglobin: 10.9 g/dL — ABNORMAL LOW (ref 11.1–15.9)
MCH: 28.8 pg (ref 26.6–33.0)
MCHC: 32.3 g/dL (ref 31.5–35.7)
MCV: 89 fL (ref 79–97)
Platelets: 251 10*3/uL (ref 150–450)
RBC: 3.79 x10E6/uL (ref 3.77–5.28)
RDW: 13.9 % (ref 11.7–15.4)
WBC: 13.8 10*3/uL — ABNORMAL HIGH (ref 3.4–10.8)

## 2018-06-23 LAB — HIV ANTIBODY (ROUTINE TESTING W REFLEX): HIV Screen 4th Generation wRfx: NONREACTIVE

## 2018-06-23 LAB — GLUCOSE TOLERANCE, 2 HOURS W/ 1HR
Glucose, 1 hour: 196 mg/dL — ABNORMAL HIGH (ref 65–179)
Glucose, 2 hour: 126 mg/dL (ref 65–152)
Glucose, Fasting: 85 mg/dL (ref 65–91)

## 2018-06-23 LAB — RPR: RPR Ser Ql: NONREACTIVE

## 2018-06-23 LAB — ANTIBODY SCREEN: Antibody Screen: NEGATIVE

## 2018-06-23 MED ORDER — ACCU-CHEK GUIDE ME W/DEVICE KIT: 1.0000 | PACK | Freq: Four times a day (QID) | 0 refills | Status: DC

## 2018-06-23 MED ORDER — GLUCOSE BLOOD VI STRP: ORAL_STRIP | 12 refills | Status: DC

## 2018-06-23 MED ORDER — ACCU-CHEK SOFTCLIX LANCETS MISC: 12 refills | Status: DC

## 2018-06-23 NOTE — Telephone Encounter (Signed)
Patient informed she did not pass glucola and will need to check blood sugar 4 times daily and bring BS log to appts.  Referral sent to dietician and should hear from them in a week.  All questions answered. Pt verbalized understanding.

## 2018-06-26 ENCOUNTER — Telehealth: Payer: Self-pay | Admitting: *Deleted

## 2018-06-26 NOTE — Telephone Encounter (Signed)
Patient states she went to the hospital yesterday and was tested for strep throat which was negative. No fever, just cough and unable to sleep at night due to the coughing.  Green mucous with blood tinge.  She has tried cough drops and other medicine but nothing is working and patient states " I can't take this coughing anymore." Would like cough medication sent in. Please advise.

## 2018-06-27 ENCOUNTER — Other Ambulatory Visit: Payer: Self-pay | Admitting: Women's Health

## 2018-06-27 MED ORDER — BENZONATATE 100 MG PO CAPS: 100.0000 mg | ORAL_CAPSULE | Freq: Three times a day (TID) | ORAL | 0 refills | Status: DC | PRN

## 2018-07-17 ENCOUNTER — Other Ambulatory Visit: Payer: Self-pay | Admitting: Obstetrics & Gynecology

## 2018-07-17 ENCOUNTER — Telehealth: Payer: Self-pay | Admitting: Advanced Practice Midwife

## 2018-07-17 MED ORDER — CLONAZEPAM 1 MG PO TABS: ORAL_TABLET | ORAL | 1 refills | Status: DC

## 2018-07-17 NOTE — Telephone Encounter (Signed)
Patient called, requesting a refill on Klonopin.  Patient may not be available to talk when her call is returned, ok per the pt to talk to her spouse, Louie Casa.  [place]

## 2018-07-17 NOTE — Telephone Encounter (Signed)
Refilled per request.

## 2018-07-19 ENCOUNTER — Ambulatory Visit: Payer: Medicaid Other

## 2018-07-20 ENCOUNTER — Encounter: Payer: Self-pay | Admitting: Women's Health

## 2018-07-21 ENCOUNTER — Ambulatory Visit (INDEPENDENT_AMBULATORY_CARE_PROVIDER_SITE_OTHER): Payer: Medicaid Other | Admitting: Obstetrics and Gynecology

## 2018-07-21 DIAGNOSIS — Z3402 Encounter for supervision of normal first pregnancy, second trimester: Secondary | ICD-10-CM

## 2018-07-21 DIAGNOSIS — O24419 Gestational diabetes mellitus in pregnancy, unspecified control: Secondary | ICD-10-CM

## 2018-07-21 DIAGNOSIS — O368130 Decreased fetal movements, third trimester, not applicable or unspecified: Secondary | ICD-10-CM

## 2018-07-21 DIAGNOSIS — Z3A30 30 weeks gestation of pregnancy: Secondary | ICD-10-CM

## 2018-07-21 DIAGNOSIS — O09893 Supervision of other high risk pregnancies, third trimester: Secondary | ICD-10-CM

## 2018-07-21 DIAGNOSIS — O09899 Supervision of other high risk pregnancies, unspecified trimester: Secondary | ICD-10-CM

## 2018-07-21 MED ORDER — METFORMIN HCL 500 MG PO TABS: 500.0000 mg | ORAL_TABLET | Freq: Every day | ORAL | 3 refills | Status: DC

## 2018-07-21 MED ORDER — DOCUSATE SODIUM 100 MG PO CAPS: 100.0000 mg | ORAL_CAPSULE | Freq: Two times a day (BID) | ORAL | 2 refills | Status: DC | PRN

## 2018-07-21 MED ORDER — FERROUS SULFATE 325 (65 FE) MG PO TBEC: 325.0000 mg | DELAYED_RELEASE_TABLET | Freq: Two times a day (BID) | ORAL | 3 refills | Status: DC

## 2018-07-21 NOTE — Progress Notes (Addendum)
Patient ID: Patricia Richards, female   DOB: February 07, 1991, 28 y.o.   MRN: [id number]    LOW-RISK PREGNANCY VISIT Patient name: Patricia Richards MRN [id number]  Date of birth: Dec 14, 1990 Chief Complaint:   Routine Prenatal Visit  History of Present Illness:   Patricia Richards is a 28 y.o. [id number] female at [redacted]w[redacted]d with an Estimated Date of Delivery: 09/26/18 being seen today for ongoing management of a low-risk pregnancy.  Today she reports decreased fetal movement x2 days ago after baby "felt like had a seizure". Says this is her first pregnancy with gestational diabetes. Currently diet controlledThis is FBS lowest is 60 and highest is 180, with erratic CBG checks, did not bring CBG log.Says she keeps notepad on her phone. Is constantly fatigued, with hx of anemia. Says that she can sleep for 5 hours and has to call husband because she can feel herself about "to pass out" from fatigue Says that husband will nudge her and she will not wake up. Is not taking any medication. Has an apointment next week with dietician. She can tell when FBS is high, has bought BP cuff to take BP at home thinking that her BP is elevated, she noted getting light heated when standing and having to sit down in crouch position. HGB 10.9 on 06/22/2018 CBC Two children live with grandparents due to cocaine use. Father takes care of 26 year old, grandmother takes care of the 36 year old. She takes care of her 26 year old and toddler under 1. This will be her 5th child, says that the only BC method work for her is [place] and is still indecisive about BTL.     Contractions: Not present. Vag. Bleeding: None.  Movement: Absent. Reports moderate vaginal cloudy discharge. Review of Systems:   Pertinent items are noted in HPI Denies abnormal headaches, visual changes, shortness of breath, chest pain, abdominal pain, severe nausea/vomiting, or problems with urination or bowel movements unless otherwise stated above. Pertinent  History Reviewed:  Reviewed past medical,surgical, social, obstetrical and family history.  Reviewed problem list, medications and allergies. Physical Assessment:   Vitals:   07/21/18 1114  BP: 128/72  Pulse: 86  Weight: 155 lb 3.2 oz (70.4 kg)  Body mass index is 27.49 kg/m.        Physical Examination:   General appearance: Well appearing, and in no distress  Mental status: Alert, oriented to person, place, and time  Skin: Warm & dry  Cardiovascular: Normal heart rate noted  Respiratory: Normal respiratory effort, no distress  Abdomen: Soft, gravid, nontender  Pelvic: Cervical exam deferred         Extremities: Edema: None  Fetal Status:     Movement: Absent    No results found for this or any previous visit (from the past 24 hour(s)).  Assessment & Plan:  1) high-risk pregnancy [id number] at [redacted]w[redacted]d with an Estimated Date of Delivery: 09/26/18    Meds: No orders of the defined types were placed in this encounter.  Labs/procedures today: NST  Plan:   F/u in 1 week w/ Patricia Richards  Iron sulfate BID with stool softeners. Metformin 500 mg daily morning with food Importance of bringing CBG log discussed. Consider UDS at f/u as precaution.  Reviewed: Preterm labor symptoms and general obstetric precautions including but not limited to vaginal bleeding, contractions, leaking of fluid and fetal movement were reviewed in detail with the patient.  All questions were answered  Follow-up: 1 wk here c  FCD, CNM                Dietician next week  No orders of the defined types were placed in this encounter.  By signing my name below, I, Patricia Richards, attest that this documentation has been prepared under the direction and in the presence of Patricia Kind, MD Electronically Signed: Experiment. 07/21/18. 11:16 AM.  I personally performed the services described in this documentation, which was SCRIBED in my presence. The recorded information has been reviewed and considered  accurate. It has been edited as necessary during review. Patricia Kind, MD

## 2018-07-25 ENCOUNTER — Encounter: Payer: Medicaid Other | Admitting: Family Medicine

## 2018-07-26 ENCOUNTER — Encounter: Payer: Medicaid Other | Attending: Advanced Practice Midwife | Admitting: Registered"

## 2018-07-26 ENCOUNTER — Encounter: Payer: Self-pay | Admitting: Registered"

## 2018-07-26 ENCOUNTER — Other Ambulatory Visit: Payer: Self-pay

## 2018-07-26 DIAGNOSIS — O99810 Abnormal glucose complicating pregnancy: Secondary | ICD-10-CM | POA: Diagnosis present

## 2018-07-26 NOTE — Progress Notes (Signed)
Patient was seen on 07/26/2018 for Gestational Diabetes self-management class at the [facility]. The following learning objectives were met by the patient during this course:   States the definition of Gestational Diabetes  States why dietary management is important in controlling blood glucose  Describes the effects each nutrient has on blood glucose levels  Demonstrates ability to create a balanced meal plan  Demonstrates carbohydrate counting   States when to check blood glucose levels  Demonstrates proper blood glucose monitoring techniques  States the effect of stress and exercise on blood glucose levels  States the importance of limiting caffeine and abstaining from alcohol and smoking  Blood glucose monitor given: none   Patient instructed to monitor glucose levels: FBS: 60 - <95; 1 hour: <140; 2 hour: <120  Patient received handouts:  Nutrition Diabetes and Pregnancy, including carb counting list  Patient will be seen for follow-up as needed.

## 2018-07-28 ENCOUNTER — Ambulatory Visit (INDEPENDENT_AMBULATORY_CARE_PROVIDER_SITE_OTHER): Payer: Medicaid Other | Admitting: Advanced Practice Midwife

## 2018-07-28 ENCOUNTER — Other Ambulatory Visit: Payer: Self-pay

## 2018-07-28 ENCOUNTER — Encounter: Payer: Medicaid Other | Admitting: Advanced Practice Midwife

## 2018-07-28 VITALS — BP 123/64 | HR 97 | Wt 157.0 lb

## 2018-07-28 DIAGNOSIS — O24414 Gestational diabetes mellitus in pregnancy, insulin controlled: Secondary | ICD-10-CM

## 2018-07-28 DIAGNOSIS — O360130 Maternal care for anti-D [Rh] antibodies, third trimester, not applicable or unspecified: Secondary | ICD-10-CM

## 2018-07-28 DIAGNOSIS — F418 Other specified anxiety disorders: Secondary | ICD-10-CM

## 2018-07-28 DIAGNOSIS — Z3A31 31 weeks gestation of pregnancy: Secondary | ICD-10-CM

## 2018-07-28 DIAGNOSIS — O26899 Other specified pregnancy related conditions, unspecified trimester: Secondary | ICD-10-CM

## 2018-07-28 DIAGNOSIS — Z6791 Unspecified blood type, Rh negative: Secondary | ICD-10-CM

## 2018-07-28 DIAGNOSIS — O0993 Supervision of high risk pregnancy, unspecified, third trimester: Secondary | ICD-10-CM

## 2018-07-28 MED ORDER — INSULIN GLARGINE 100 UNIT/ML SOLOSTAR PEN: 20.0000 [IU] | PEN_INJECTOR | Freq: Every day | SUBCUTANEOUS | 11 refills | Status: DC

## 2018-07-28 MED ORDER — RHO D IMMUNE GLOBULIN 1500 UNIT/2ML IJ SOSY: 300.0000 ug | PREFILLED_SYRINGE | Freq: Once | INTRAMUSCULAR | Status: DC

## 2018-07-28 MED ORDER — PEN NEEDLES 31G X 6 MM MISC: 3 refills | Status: DC

## 2018-07-28 NOTE — Progress Notes (Signed)
HIGH-RISK PREGNANCY VISIT Patient name: Patricia Richards MRN [id number]  Date of birth: 1990-12-18 Chief Complaint:   Routine Prenatal Visit  History of Present Illness:   Lincoln Kleiner Uriel Horkey is a 28 y.o. [id number] female at 69w3dwith an Estimated Date of Delivery: 09/26/18 being seen today for ongoing management of a high-risk pregnancy complicated by gestational DM, class  A2 DM.  Today she reports feeling tired all the time, anxiety about dying during childbirth, worried something is going to happen. She sees a therapist twice a month, aware that feelings are probably irrational, but has them none the less.  Bought a BP cuff  BP always normal.  Was started on metformin 504mq am last week.  Blood sugars are all nomral pp, fastings 95-100. Had some discomfort w/sex in vagina, ? BV. . Marland Kitchenontractions: Irregular. Vag. Bleeding: None.  Movement: Present. denies leaking of fluid.  Review of Systems:   Pertinent items are noted in HPI Denies abnormal vaginal discharge w/ itching/odor/irritation, headaches, visual changes, shortness of breath, chest pain, abdominal pain, severe nausea/vomiting, or problems with urination or bowel movements unless otherwise stated above.    Pertinent History Reviewed:  Medical & Surgical Hx:   Past Medical History:  Diagnosis Date  . 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 Anemia   . Bacterial infection   . Bipolar 1 disorder ([facility]  . Depression    on meds now, doing well  . Herpes   . Infection    UTI  . Kidney stones   . 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 Panic attacks   . Pregnant   . Urinary tract infection    gets them frequently  . Vaginal Pap smear, abnormal    Past Surgical History:  Procedure Laterality Date  . DILATION AND EVACUATION  11/28/2010   Family History  Problem Relation Age of Onset  . Hypertension Father   . Cancer Father        skin  .  Mental illness Father   . Cancer Maternal Grandmother   . Mental illness Maternal Grandmother   . Stroke Maternal Grandmother   . Cerebral palsy Mother   . Other Brother        problems with gums  . Heart attack Paternal Grandmother   . Down syndrome Cousin   . Bipolar disorder Cousin     Current Outpatient Medications:  .  ACCU-CHEK SOFTCLIX LANCETS lancets, Use as instructed to check blood sugar 4 times daily, Disp: 100 each, Rfl: 12 .  acetaminophen (TYLENOL) 325 MG tablet, Take 650 mg by mouth every 6 (six) hours as needed., Disp: , Rfl:  .  Blood Glucose Monitoring Suppl (ACCU-CHEK GUIDE ME) w/Device KIT, 1 each by Does not apply route 4 (four) times daily., Disp: 1 kit, Rfl: 0 .  clonazePAM (KLONOPIN) 1 MG tablet, 1 tablet three times daily prn anxiety, Disp: 90 tablet, Rfl: 1 .  docusate sodium (COLACE) 100 MG capsule, Take 1 capsule (100 mg total) by mouth 2 (two) times daily as needed., Disp: 30 capsule, Rfl: 2 .  ferrous sulfate 325 (65 FE) MG EC tablet, Take 1 tablet (325 mg total) by mouth 2 (two) times daily with a meal., Disp: 60 tablet, Rfl: 3 .  glucose blood (ACCU-CHEK GUIDE) test strip, Use as instructed to check blood sugar 4 times daily, Disp: 100 each, Rfl: 12 .  metFORMIN (GLUCOPHAGE) 500 MG tablet, Take 1 tablet (500 mg total) by mouth daily with breakfast., Disp: 30 tablet, Rfl: 3 .  acyclovir (ZOVIRAX) 400 MG tablet, , Disp: , Rfl: 0 .  famotidine (PEPCID) 20 MG tablet, Take 20 mg by mouth 2 (two) times daily., Disp: , Rfl:  .  Insulin Glargine (LANTUS) 100 UNIT/ML Solostar Pen, Inject 20 Units into the skin at bedtime., Disp: 15 mL, Rfl: 11 .  Insulin Pen Needle (PEN NEEDLES) 31G X 6 MM MISC, Use with Lantu, Disp: 30 each, Rfl: 3 .  lidocaine (XYLOCAINE) 2 % solution, Use as directed 5 mLs in the mouth or throat as needed for mouth pain. (Patient not taking: Reported on 07/21/2018), Disp: 100 mL, Rfl: 0 .  Prenatal Vit-Fe Fumarate-FA (PNV PRENATAL PLUS MULTIVITAMIN)  27-1 MG TABS, Take 1 tablet by mouth daily. (Patient not taking: Reported on 07/28/2018), Disp: 30 tablet, Rfl: 11 Social History: Reviewed -  reports that she quit smoking about 4 years ago. Her smoking use included cigarettes. She has never used smokeless tobacco.   Physical Assessment:   Vitals:   07/28/18 1018  BP: 123/64  Pulse: 97  Weight: 157 lb (71.2 kg)  Body mass index is 27.81 kg/m.           Physical Examination:   General appearance: alert, well appearing, and in no distress  Mental status: alert, oriented to person, place, and time  Skin: warm & dry   Extremities: Edema: None    Cardiovascular: normal heart rate noted  Respiratory: normal respiratory effort, no distress  Abdomen: gravid, soft, non-tender  Pelvic: SSE:  +  Semen in vagina. Wet prep negative. Vagina not red         Fetal Status:     Movement: Present    Fetal Surveillance Testing today: doppler  No results found for this or any previous visit (from the past 24 hour(s)).  Assessment & Plan:  1) High-risk pregnancy [id number] at 65w3dwith an Estimated Date of Delivery: 09/26/18   2) A2DM, unstable.  Treatment Plan:  Switch to insulin . [facility] recommends Lantus 20u q hs. Met w/RN for insulin admin instructions  3) Anxiety, .  Treatment Plan:  Continue klonopin, encouraged to try to talk more w/therapist  Pt given reassurance that pregnancy is going well except for blood sugars, which are only a little bit out of range  Labs/procedures today: none   Follow-up: Return in about 1 week (around 08/04/2018) for HROB. check BS (signed up for babyscripts reporting)  Future Appointments  Date Time [place] 08/04/2018  1:00 PM BRoma Schanz CNM [facility]    Orders Placed This Encounter  Procedures  . RHO (D) Immune Globulin   FChristin FudgeCNM 07/28/2018 1:49 PM

## 2018-08-04 ENCOUNTER — Encounter: Payer: Self-pay | Admitting: Women's Health

## 2018-08-04 ENCOUNTER — Other Ambulatory Visit: Payer: Self-pay

## 2018-08-04 ENCOUNTER — Ambulatory Visit (INDEPENDENT_AMBULATORY_CARE_PROVIDER_SITE_OTHER): Payer: Medicaid Other | Admitting: Women's Health

## 2018-08-04 VITALS — BP 101/65 | HR 102 | Wt 157.0 lb

## 2018-08-04 DIAGNOSIS — Z1389 Encounter for screening for other disorder: Secondary | ICD-10-CM

## 2018-08-04 DIAGNOSIS — O24419 Gestational diabetes mellitus in pregnancy, unspecified control: Secondary | ICD-10-CM

## 2018-08-04 DIAGNOSIS — Z3A32 32 weeks gestation of pregnancy: Secondary | ICD-10-CM

## 2018-08-04 DIAGNOSIS — O24414 Gestational diabetes mellitus in pregnancy, insulin controlled: Secondary | ICD-10-CM

## 2018-08-04 DIAGNOSIS — O0993 Supervision of high risk pregnancy, unspecified, third trimester: Secondary | ICD-10-CM

## 2018-08-04 DIAGNOSIS — Z23 Encounter for immunization: Secondary | ICD-10-CM | POA: Diagnosis not present

## 2018-08-04 DIAGNOSIS — Z331 Pregnant state, incidental: Secondary | ICD-10-CM

## 2018-08-04 DIAGNOSIS — O98313 Other infections with a predominantly sexual mode of transmission complicating pregnancy, third trimester: Secondary | ICD-10-CM

## 2018-08-04 LAB — POCT URINALYSIS DIPSTICK OB
Blood, UA: NEGATIVE
Glucose, UA: NEGATIVE
Ketones, UA: NEGATIVE
LEUKOCYTES UA: NEGATIVE
NITRITE UA: NEGATIVE
PROTEIN: NEGATIVE

## 2018-08-04 MED ORDER — ACYCLOVIR 400 MG PO TABS: 400.0000 mg | ORAL_TABLET | Freq: Three times a day (TID) | ORAL | 3 refills | Status: DC

## 2018-08-04 MED ORDER — METFORMIN HCL 500 MG PO TABS: 500.0000 mg | ORAL_TABLET | Freq: Every day | ORAL | 3 refills | Status: DC

## 2018-08-04 NOTE — Patient Instructions (Signed)
Patricia Richards, I greatly value your feedback.  If you receive a survey following your visit with [place] today, we appreciate you taking the time to fill it out.  Thanks, Knute Neu, CNM, [facility]  [place]!!! It is now [facility] at [facility] ([place], [place] [address]) Entrance located off of [address] [place] parking    Call the office [phone]) or go to [facility] if:  You begin to have strong, frequent contractions  Your water breaks.  Sometimes it is a big gush of fluid, sometimes it is just a trickle that keeps getting your panties wet or running down your legs  You have vaginal bleeding.  It is normal to have a small amount of spotting if your cervix was checked.   You don't feel your baby moving like normal.  If you don't, get you something to eat and drink and lay down and focus on feeling your baby move.  You should feel at least 10 movements in 2 hours.  If you don't, you should call the office or go to [facility].    Tdap Vaccine  It is recommended that you get the Tdap vaccine during the third trimester of EACH pregnancy to help protect your baby from getting pertussis (whooping cough)  27-36 weeks is the BEST time to do this so that you can pass the protection on to your baby. During pregnancy is better than after pregnancy, but if you are unable to get it during pregnancy it will be offered at the hospital.   You can get this vaccine with [place], at the health department, your family doctor, or some local pharmacies  Everyone who will be around your baby should also be up-to-date on their vaccines before the baby comes. Adults (who are not pregnant) only need 1 dose of Tdap during adulthood.   Third Trimester of Pregnancy The third trimester is from week 29 through week 42, months 7 through 9. The third trimester is a time when the fetus is growing rapidly. At the end of the ninth month, the fetus  is about 20 inches in length and weighs 6-10 pounds.  BODY CHANGES Your body goes through many changes during pregnancy. The changes vary from woman to woman.   Your weight will continue to increase. You can expect to gain 25-35 pounds (11-16 kg) by the end of the pregnancy.  You may begin to get stretch marks on your hips, abdomen, and breasts.  You may urinate more often because the fetus is moving lower into your pelvis and pressing on your bladder.  You may develop or continue to have heartburn as a result of your pregnancy.  You may develop constipation because certain hormones are causing the muscles that push waste through your intestines to slow down.  You may develop hemorrhoids or swollen, bulging veins (varicose veins).  You may have pelvic pain because of the weight gain and pregnancy hormones relaxing your joints between the bones in your pelvis. Backaches may result from overexertion of the muscles supporting your posture.  You may have changes in your hair. These can include thickening of your hair, rapid growth, and changes in texture. Some women also have hair loss during or after pregnancy, or hair that feels dry or thin. Your hair will most likely return to normal after your baby is born.  Your breasts will continue to grow and be tender. A yellow discharge may  leak from your breasts called colostrum.  Your belly button may stick out.  You may feel short of breath because of your expanding uterus.  You may notice the fetus "dropping," or moving lower in your abdomen.  You may have a bloody mucus discharge. This usually occurs a few days to a week before labor begins.  Your cervix becomes thin and soft (effaced) near your due date. WHAT TO EXPECT AT YOUR PRENATAL EXAMS  You will have prenatal exams every 2 weeks until week 36. Then, you will have weekly prenatal exams. During a routine prenatal visit:  You will be weighed to make sure you and the fetus are growing  normally.  Your blood pressure is taken.  Your abdomen will be measured to track your baby's growth.  The fetal heartbeat will be listened to.  Any test results from the previous visit will be discussed.  You may have a cervical check near your due date to see if you have effaced. At around 36 weeks, your caregiver will check your cervix. At the same time, your caregiver will also perform a test on the secretions of the vaginal tissue. This test is to determine if a type of bacteria, Group B streptococcus, is present. Your caregiver will explain this further. Your caregiver may ask you:  What your birth plan is.  How you are feeling.  If you are feeling the baby move.  If you have had any abnormal symptoms, such as leaking fluid, bleeding, severe headaches, or abdominal cramping.  If you have any questions. Other tests or screenings that may be performed during your third trimester include:  Blood tests that check for low iron levels (anemia).  Fetal testing to check the health, activity level, and growth of the fetus. Testing is done if you have certain medical conditions or if there are problems during the pregnancy. FALSE LABOR You may feel small, irregular contractions that eventually go away. These are called Braxton Hicks contractions, or false labor. Contractions may last for hours, days, or even weeks before true labor sets in. If contractions come at regular intervals, intensify, or become painful, it is best to be seen by your caregiver.  SIGNS OF LABOR   Menstrual-like cramps.  Contractions that are 5 minutes apart or less.  Contractions that start on the top of the uterus and spread down to the lower abdomen and back.  A sense of increased pelvic pressure or back pain.  A watery or bloody mucus discharge that comes from the vagina. If you have any of these signs before the 37th week of pregnancy, call your caregiver right away. You need to go to the hospital to  get checked immediately. HOME CARE INSTRUCTIONS   Avoid all smoking, herbs, alcohol, and unprescribed drugs. These chemicals affect the formation and growth of the baby.  Follow your caregiver's instructions regarding medicine use. There are medicines that are either safe or unsafe to take during pregnancy.  Exercise only as directed by your caregiver. Experiencing uterine cramps is a good sign to stop exercising.  Continue to eat regular, healthy meals.  Wear a good support bra for breast tenderness.  Do not use hot tubs, steam rooms, or saunas.  Wear your seat belt at all times when driving.  Avoid raw meat, uncooked cheese, cat litter boxes, and soil used by cats. These carry germs that can cause birth defects in the baby.  Take your prenatal vitamins.  Try taking a stool softener (if your caregiver  approves) if you develop constipation. Eat more high-fiber foods, such as fresh vegetables or fruit and whole grains. Drink plenty of fluids to keep your urine clear or pale yellow.  Take warm sitz baths to soothe any pain or discomfort caused by hemorrhoids. Use hemorrhoid cream if your caregiver approves.  If you develop varicose veins, wear support hose. Elevate your feet for 15 minutes, 3-4 times a day. Limit salt in your diet.  Avoid heavy lifting, wear low heal shoes, and practice good posture.  Rest a lot with your legs elevated if you have leg cramps or low back pain.  Visit your dentist if you have not gone during your pregnancy. Use a soft toothbrush to brush your teeth and be gentle when you floss.  A sexual relationship may be continued unless your caregiver directs you otherwise.  Do not travel far distances unless it is absolutely necessary and only with the approval of your caregiver.  Take prenatal classes to understand, practice, and ask questions about the labor and delivery.  Make a trial run to the hospital.  Pack your hospital bag.  Prepare the baby's  nursery.  Continue to go to all your prenatal visits as directed by your caregiver. SEEK MEDICAL CARE IF:  You are unsure if you are in labor or if your water has broken.  You have dizziness.  You have mild pelvic cramps, pelvic pressure, or nagging pain in your abdominal area.  You have persistent nausea, vomiting, or diarrhea.  You have a bad smelling vaginal discharge.  You have pain with urination. SEEK IMMEDIATE MEDICAL CARE IF:   You have a fever.  You are leaking fluid from your vagina.  You have spotting or bleeding from your vagina.  You have severe abdominal cramping or pain.  You have rapid weight loss or gain.  You have shortness of breath with chest pain.  You notice sudden or extreme swelling of your face, hands, ankles, feet, or legs.  You have not felt your baby move in over an hour.  You have severe headaches that do not go away with medicine.  You have vision changes. Document Released: 04/27/2001 Document Revised: 05/08/2013 Document Reviewed: 07/04/2012 [facility] Patient Information 2015 [place], [place]. This information is not intended to replace advice given to you by your health care provider. Make sure you discuss any questions you have with your health care provider.   Coronavirus (COVID-19) Are you at risk?  Are you at risk for the Coronavirus (COVID-19)?  To be considered HIGH RISK for Coronavirus (COVID-19), you have to meet the following criteria:  . Traveled to [place], [place], [place], [place] or [place]; or in the [place] to [place], [place], [place], or [place]; and have fever, cough, and shortness of breath within the last 2 weeks of travel OR . Been in close contact with a person diagnosed with COVID-19 within the last 2 weeks and have fever, cough, and shortness of breath . IF YOU DO NOT MEET THESE CRITERIA, YOU ARE CONSIDERED LOW RISK FOR COVID-19.  What to do if you are HIGH RISK for COVID-19?  Marland Kitchen If you are having a  medical emergency, call 911. . Seek medical care right away. Before you go to a doctor's office, urgent care or emergency department, call ahead and tell them about your recent travel, contact with someone diagnosed with COVID-19, and your symptoms. You should receive instructions from your physician's office regarding next steps of care.  . When you  arrive at healthcare provider, tell the healthcare staff immediately you have returned from visiting [place], [place], [place], [place] or [place]; or traveled in the [place] to [place], [place], [place], or [place]; in the last two weeks or you have been in close contact with a person diagnosed with COVID-19 in the last 2 weeks.   . Tell the health care staff about your symptoms: fever, cough and shortness of breath. . After you have been seen by a medical provider, you will be either: o Tested for (COVID-19) and discharged home on quarantine except to seek medical care if symptoms worsen, and asked to  - Stay home and avoid contact with others until you get your results (4-5 days)  - Avoid travel on public transportation if possible (such as bus, train, or airplane) or o Sent to the Emergency Department by EMS for evaluation, COVID-19 testing, and possible admission depending on your condition and test results.  What to do if you are LOW RISK for COVID-19?  Reduce your risk of any infection by using the same precautions used for avoiding the common cold or flu:  Marland Kitchen Wash your hands often with soap and warm water for at least 20 seconds.  If soap and water are not readily available, use an alcohol-based hand sanitizer with at least 60% alcohol.  . If coughing or sneezing, cover your mouth and nose by coughing or sneezing into the elbow areas of your shirt or coat, into a tissue or into your sleeve (not your hands). . Avoid shaking hands with others and consider head nods or verbal greetings only. . Avoid touching your eyes, nose, or mouth with  unwashed hands.  . Avoid close contact with people who are sick. . Avoid places or events with large numbers of people in one location, like concerts or sporting events. . Carefully consider travel plans you have or are making. . If you are planning any travel outside or inside the [place], visit the CDC's Travelers' Health webpage for the latest health notices. . If you have some symptoms but not all symptoms, continue to monitor at home and seek medical attention if your symptoms worsen. . If you are having a medical emergency, call 911.   [place] / e-Visit: [email or web address]         [facility]: [facility]: [phone]                   [facility]: [phone]

## 2018-08-04 NOTE — Progress Notes (Signed)
HIGH-RISK PREGNANCY VISIT Patient name: Patricia Richards MRN [id number]  Date of birth: Jul 26, 1990 Chief Complaint:   High Risk Gestation (+ swelling; discuss diabetes )  History of Present Illness:   Patricia Richards is a 28 y.o. 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 Herpes outbreak last week.  Contractions: Irregular. Vag. Bleeding: None.  Movement: Present. denies leaking of fluid.  Review of Systems:   Pertinent items are noted in HPI Denies abnormal vaginal discharge w/ itching/odor/irritation, headaches, visual changes, shortness of breath, chest pain, abdominal pain, severe nausea/vomiting, or problems with urination or bowel movements unless otherwise stated above. Pertinent History Reviewed:  Reviewed past medical,surgical, social, obstetrical and family history.  Reviewed problem list, medications and allergies. Physical Assessment:   Vitals:   08/04/18 1308  BP: 101/65  Pulse: (!) 102  Weight: 157 lb (71.2 kg)  Body mass index is 27.81 kg/m.           Physical Examination:   General appearance: alert, well appearing, and in no distress  Mental status: alert, oriented to person, place, and time  Skin: warm & dry   Extremities: Edema: Trace    Cardiovascular: normal heart rate noted  Respiratory: normal respiratory effort, no distress  Abdomen: gravid, soft, non-tender  Pelvic: Cervical exam deferred         Fetal Status: Fetal Heart Rate (bpm): 135 Fundal Height: 30 cm Movement: Present    Fetal Surveillance Testing today: doppler   Results for orders placed or performed in visit on 08/04/18 (from the past 24 hour(s))  POC Urinalysis Dipstick OB   Collection Time: 08/04/18  1:08 PM  Result Value Ref Range   Color, UA     Clarity, UA     Glucose, UA Negative Negative   Bilirubin, UA     Ketones, UA neg    Spec Grav, UA     Blood, UA neg    pH, UA     POC,PROTEIN,UA Negative Negative, Trace, Small (1+), Moderate (2+), Large (3+), 4+   Urobilinogen, UA     Nitrite, UA neg    Leukocytes, UA Negative Negative   Appearance     Odor      Assessment & Plan:  1) High-risk pregnancy [id number] at [redacted]w[redacted]d with an Estimated Date of Delivery: 09/26/18   2) A2DM, unstable, continue lantus 20u q hs, add metformin 500 hs  3) HSV2, 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 metFORMIN (GLUCOPHAGE) 500 MG tablet    Sig: Take 1 tablet (500 mg total) by mouth at bedtime.    Dispense:  30 tablet    Refill:  3    Order Specific Question:   Supervising Provider    Answer:   Elonda Husky, LUTHER H [[id number]]  . acyclovir (ZOVIRAX) 400 MG tablet    Sig: Take 1 tablet (400 mg total) by mouth 3 (three) times daily.    Dispense:  90 tablet    Refill:  3    Order Specific Question:   Supervising Provider    Answer:   Florian Buff [[id number]]    Labs/procedures today: tdap  Treatment Plan:  Growth u/s @  32, 36wks  2x/wk testing @ 32wks or weekly BPP    Deliver @ 39wks:______   Reviewed: Preterm labor symptoms and general obstetric precautions including but not limited to vaginal bleeding, contractions, leaking of fluid and fetal movement were reviewed in detail with the patient.  All questions were answered.  Follow-up: Return in about 1 week (around 08/11/2018) for efw/afi/bpp u/s and hrob.  Orders Placed This Encounter  Procedures  . US OB Follow Up  . US FETAL BPP WO NON STRESS  . Tdap vaccine greater than or equal to 7yo IM  . POC Urinalysis Dipstick OB   Patricia Richards CNM, [facility] 08/04/2018 2:12 PM

## 2018-08-09 ENCOUNTER — Telehealth: Payer: Self-pay | Admitting: *Deleted

## 2018-08-09 NOTE — Telephone Encounter (Signed)
Called patient to inform her of COVID19 restrictions, no answer and voicemail is full.

## 2018-08-10 ENCOUNTER — Other Ambulatory Visit: Payer: Self-pay

## 2018-08-10 ENCOUNTER — Ambulatory Visit (INDEPENDENT_AMBULATORY_CARE_PROVIDER_SITE_OTHER): Payer: Medicaid Other | Admitting: Obstetrics & Gynecology

## 2018-08-10 ENCOUNTER — Ambulatory Visit (INDEPENDENT_AMBULATORY_CARE_PROVIDER_SITE_OTHER): Payer: Medicaid Other

## 2018-08-10 VITALS — BP 104/73 | HR 106 | Wt 155.0 lb

## 2018-08-10 DIAGNOSIS — O24419 Gestational diabetes mellitus in pregnancy, unspecified control: Secondary | ICD-10-CM

## 2018-08-10 DIAGNOSIS — Z362 Encounter for other antenatal screening follow-up: Secondary | ICD-10-CM

## 2018-08-10 DIAGNOSIS — O0993 Supervision of high risk pregnancy, unspecified, third trimester: Secondary | ICD-10-CM

## 2018-08-10 DIAGNOSIS — O09899 Supervision of other high risk pregnancies, unspecified trimester: Secondary | ICD-10-CM

## 2018-08-10 DIAGNOSIS — Z1389 Encounter for screening for other disorder: Secondary | ICD-10-CM

## 2018-08-10 DIAGNOSIS — Z331 Pregnant state, incidental: Secondary | ICD-10-CM

## 2018-08-10 DIAGNOSIS — Z3A33 33 weeks gestation of pregnancy: Secondary | ICD-10-CM

## 2018-08-10 MED ORDER — METFORMIN HCL 500 MG PO TABS: 500.0000 mg | ORAL_TABLET | Freq: Two times a day (BID) | ORAL | 3 refills | Status: DC

## 2018-08-10 NOTE — Progress Notes (Signed)
[place] 67+5 WKS,cephalic,ant pl gr 2,afi 18 cm,fhr 133 bpm,normal ovaries bilat,BPP 8/8

## 2018-08-10 NOTE — Progress Notes (Signed)
   HIGH-RISK PREGNANCY VISIT Patient name: Patricia Richards MRN [id number]  Date of birth: 1991-05-11 Chief Complaint:   Routine Prenatal Visit ([place] today)  History of Present Illness:   Patricia Richards is a 28 y.o. [phone] female at [redacted]w[redacted]d with an Estimated Date of Delivery: 09/26/18 being seen today for ongoing management of a high-risk pregnancy complicated by class  A2 DM.  Today she reports no complaints. Contractions: Irregular. Vag. Bleeding: None.  Movement: Present. denies leaking of fluid.  Review of Systems:   Pertinent items are noted in HPI Denies abnormal vaginal discharge w/ itching/odor/irritation, headaches, visual changes, shortness of breath, chest pain, abdominal pain, severe nausea/vomiting, or problems with urination or bowel movements unless otherwise stated above. Pertinent History Reviewed:  Reviewed past medical,surgical, social, obstetrical and family history.  Reviewed problem list, medications and allergies. Physical Assessment:   Vitals:   08/10/18 1043  BP: 104/73  Pulse: (!) 106  Weight: 155 lb (70.3 kg)  Body mass index is 27.46 kg/m.           Physical Examination:   General appearance: alert, well appearing, and in no distress  Mental status: alert, oriented to person, place, and time  Skin: warm & dry   Extremities: Edema: Trace    Cardiovascular: normal heart rate noted  Respiratory: normal respiratory effort, no distress  Abdomen: gravid, soft, non-tender  Pelvic: Cervical exam deferred         Fetal Status:     Movement: Present    Fetal Surveillance Testing today: BPP 8/8   No results found for this or any previous visit (from the past 24 hour(s)).  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 metFORMIN (GLUCOPHAGE) 500 MG tablet    Sig: Take 1 tablet (500 mg total) by mouth 2 (two) times daily with a meal.    Dispense:  60 tablet    Refill:  3    Labs/procedures today: sonogram BPP 8/8   Treatment Plan:  Continue weekly BPP and CBG, IOL 39 weeks or as clinically indicated  Reviewed: Preterm labor symptoms and general obstetric precautions including but not limited to vaginal bleeding, contractions, leaking of fluid and fetal movement were reviewed in detail with the patient.  All questions were answered.  Follow-up: Return in about 1 week (around 08/17/2018) for BPP/sono, HROB.  Orders Placed This Encounter  Procedures  . POC Urinalysis Dipstick OB   Florian Buff  08/10/2018 10:58 AM

## 2018-08-16 ENCOUNTER — Other Ambulatory Visit: Payer: Self-pay | Admitting: Obstetrics & Gynecology

## 2018-08-17 ENCOUNTER — Telehealth: Payer: Self-pay | Admitting: *Deleted

## 2018-08-17 ENCOUNTER — Other Ambulatory Visit: Payer: Self-pay | Admitting: Obstetrics & Gynecology

## 2018-08-17 DIAGNOSIS — O24419 Gestational diabetes mellitus in pregnancy, unspecified control: Secondary | ICD-10-CM

## 2018-08-17 NOTE — Telephone Encounter (Signed)
Unable to reach pt for COVID-19 screening.

## 2018-08-18 ENCOUNTER — Other Ambulatory Visit: Payer: Self-pay

## 2018-08-18 ENCOUNTER — Ambulatory Visit (INDEPENDENT_AMBULATORY_CARE_PROVIDER_SITE_OTHER): Payer: Medicaid Other | Admitting: Obstetrics & Gynecology

## 2018-08-18 ENCOUNTER — Ambulatory Visit (INDEPENDENT_AMBULATORY_CARE_PROVIDER_SITE_OTHER): Payer: Medicaid Other

## 2018-08-18 VITALS — BP 101/69 | HR 109 | Wt 152.0 lb

## 2018-08-18 DIAGNOSIS — Z3A34 34 weeks gestation of pregnancy: Secondary | ICD-10-CM

## 2018-08-18 DIAGNOSIS — O24419 Gestational diabetes mellitus in pregnancy, unspecified control: Secondary | ICD-10-CM

## 2018-08-18 DIAGNOSIS — O0993 Supervision of high risk pregnancy, unspecified, third trimester: Secondary | ICD-10-CM

## 2018-08-18 DIAGNOSIS — O09899 Supervision of other high risk pregnancies, unspecified trimester: Secondary | ICD-10-CM

## 2018-08-18 DIAGNOSIS — F418 Other specified anxiety disorders: Secondary | ICD-10-CM

## 2018-08-18 DIAGNOSIS — Z1389 Encounter for screening for other disorder: Secondary | ICD-10-CM

## 2018-08-18 DIAGNOSIS — O99343 Other mental disorders complicating pregnancy, third trimester: Secondary | ICD-10-CM

## 2018-08-18 DIAGNOSIS — Z331 Pregnant state, incidental: Secondary | ICD-10-CM

## 2018-08-18 LAB — POCT URINALYSIS DIPSTICK OB
Blood, UA: NEGATIVE
Glucose, UA: NEGATIVE
Ketones, UA: NEGATIVE
Leukocytes, UA: NEGATIVE
Nitrite, UA: NEGATIVE
POC,PROTEIN,UA: NEGATIVE

## 2018-08-18 MED ORDER — GLYBURIDE 5 MG PO TABS: 5.0000 mg | ORAL_TABLET | Freq: Two times a day (BID) | ORAL | 1 refills | Status: DC

## 2018-08-18 NOTE — Progress Notes (Signed)
[place] 19+4 wks,cephalic,anterior placenta gr 2,BPP 8/8,afi 15.5 cm,fhr 146 bpm

## 2018-08-18 NOTE — Progress Notes (Signed)
   HIGH-RISK PREGNANCY VISIT Patient name: Patricia Richards MRN [id number]  Date of birth: 1990/07/03 Chief Complaint:   Routine Prenatal Visit ([place] today)  History of Present Illness:   Patricia Richards is a 28 y.o. [phone] female at [redacted]w[redacted]d with an Estimated Date of Delivery: 09/26/18 being seen today for ongoing management of a high-risk pregnancy complicated by class  A2 DM.  Today she reports nausea and vomiting with metformin. Contractions: Irregular. Vag. Bleeding: None.  Movement: Present. denies leaking of fluid.  Review of Systems:   Pertinent items are noted in HPI Denies abnormal vaginal discharge w/ itching/odor/irritation, headaches, visual changes, shortness of breath, chest pain, abdominal pain, severe nausea/vomiting, or problems with urination or bowel movements unless otherwise stated above. Pertinent History Reviewed:  Reviewed past medical,surgical, social, obstetrical and family history.  Reviewed problem list, medications and allergies. Physical Assessment:   Vitals:   08/18/18 1105  BP: 101/69  Pulse: (!) 109  Weight: 152 lb (68.9 kg)  Body mass index is 26.93 kg/m.           Physical Examination:   General appearance: alert, well appearing, and in no distress  Mental status: alert, oriented to person, place, and time  Skin: warm & dry   Extremities: Edema: Trace    Cardiovascular: normal heart rate noted  Respiratory: normal respiratory effort, no distress  Abdomen: gravid, soft, non-tender  Pelvic: Cervical exam deferred         Fetal Status:     Movement: Present    Fetal Surveillance Testing today: BPP 8/8   Results for orders placed or performed in visit on 08/18/18 (from the past 24 hour(s))  POC Urinalysis Dipstick OB   Collection Time: 08/18/18 10:46 AM  Result Value Ref Range   Color, UA     Clarity, UA     Glucose, UA Negative Negative   Bilirubin, UA     Ketones, UA neg    Spec Grav, UA     Blood, UA neg    pH, UA     POC,PROTEIN,UA Negative Negative, Trace, Small (1+), Moderate (2+), Large (3+), 4+   Urobilinogen, UA     Nitrite, UA neg    Leukocytes, UA Negative Negative   Appearance     Odor      Assessment & Plan:  1) High-risk pregnancy [id number] at [redacted]w[redacted]d with an Estimated Date of Delivery: 09/26/18   2) Class A2 DM, stable, CBG are good, pt is having nausea and vomiting with metformin so will switch to glyburide 5 mg BD WC  3) Anxiety disorder, stable, on klonipen  Meds:  Meds ordered this encounter  Medications  . glyBURIDE (DIABETA) 5 MG tablet    Sig: Take 1 tablet (5 mg total) by mouth 2 (two) times daily with a meal.    Dispense:  60 tablet    Refill:  1    Labs/procedures today: BPP 8/8  Treatment Plan:  Weekly BPP glyburide 5 mg BID + lantus 20 units at bedtime IOL 39 weeks or as clinically indicated  Reviewed: Preterm labor symptoms and general obstetric precautions including but not limited to vaginal bleeding, contractions, leaking of fluid and fetal movement were reviewed in detail with the patient.  All questions were answered.  Follow-up: Return in about 1 week (around 08/25/2018) for BPP/sono, HROB.  Orders Placed This Encounter  Procedures  . POC Urinalysis Dipstick OB   Florian Buff CNM, [facility]NP-[facility] 08/18/2018 11:13 AM

## 2018-08-24 ENCOUNTER — Other Ambulatory Visit: Payer: Self-pay | Admitting: Obstetrics & Gynecology

## 2018-08-24 ENCOUNTER — Telehealth: Payer: Self-pay | Admitting: *Deleted

## 2018-08-24 DIAGNOSIS — O24419 Gestational diabetes mellitus in pregnancy, unspecified control: Secondary | ICD-10-CM

## 2018-08-24 NOTE — Telephone Encounter (Signed)
Attempted to call patient to discuss covid-19 restrictions, unable to reach.

## 2018-08-28 ENCOUNTER — Other Ambulatory Visit: Payer: Self-pay

## 2018-08-28 ENCOUNTER — Ambulatory Visit (INDEPENDENT_AMBULATORY_CARE_PROVIDER_SITE_OTHER): Payer: Medicaid Other | Admitting: Obstetrics & Gynecology

## 2018-08-28 ENCOUNTER — Other Ambulatory Visit: Payer: Self-pay | Admitting: Obstetrics & Gynecology

## 2018-08-28 ENCOUNTER — Ambulatory Visit (INDEPENDENT_AMBULATORY_CARE_PROVIDER_SITE_OTHER): Payer: Medicaid Other

## 2018-08-28 VITALS — BP 105/72 | HR 83 | Wt 156.0 lb

## 2018-08-28 DIAGNOSIS — O24419 Gestational diabetes mellitus in pregnancy, unspecified control: Secondary | ICD-10-CM

## 2018-08-28 DIAGNOSIS — O0993 Supervision of high risk pregnancy, unspecified, third trimester: Secondary | ICD-10-CM

## 2018-08-28 DIAGNOSIS — Z3A35 35 weeks gestation of pregnancy: Secondary | ICD-10-CM

## 2018-08-28 DIAGNOSIS — Z1389 Encounter for screening for other disorder: Secondary | ICD-10-CM

## 2018-08-28 DIAGNOSIS — Z331 Pregnant state, incidental: Secondary | ICD-10-CM

## 2018-08-28 DIAGNOSIS — O09899 Supervision of other high risk pregnancies, unspecified trimester: Secondary | ICD-10-CM

## 2018-08-28 LAB — OB RESULTS CONSOLE GBS: GBS: NEGATIVE

## 2018-08-28 NOTE — Progress Notes (Signed)
[place] 76+5 wks,cephalic,fhr 465 bpm,cx 4.2 cm,anterior placenta gr 2,afi 17 cm,normal ovaries bilat,BPP 8/8

## 2018-08-28 NOTE — Progress Notes (Signed)
   HIGH-RISK PREGNANCY VISIT Patient name: Patricia Richards MRN [id number]  Date of birth: 25-Dec-1990 Chief Complaint:   Routine Prenatal Visit (us/cultures)  History of Present Illness:   Patricia Richards Patricia Richards is a 28 y.o. [phone] female at [redacted]w[redacted]d with an Estimated Date of Delivery: 09/26/18 being seen today for ongoing management of a high-risk pregnancy complicated by class  A2 DM.  Today she reports no complaints. Contractions: Irregular. Vag. Bleeding: None.  Movement: Present. denies leaking of fluid.  Review of Systems:   Pertinent items are noted in HPI Denies abnormal vaginal discharge w/ itching/odor/irritation, headaches, visual changes, shortness of breath, chest pain, abdominal pain, severe nausea/vomiting, or problems with urination or bowel movements unless otherwise stated above. Pertinent History Reviewed:  Reviewed past medical,surgical, social, obstetrical and family history.  Reviewed problem list, medications and allergies. Physical Assessment:   Vitals:   08/28/18 1536  BP: 105/72  Pulse: 83  Weight: 156 lb (70.8 kg)  Body mass index is 27.63 kg/m.           Physical Examination:   General appearance: alert, well appearing, and in no distress  Mental status: alert, oriented to person, place, and time  Skin: warm & dry   Extremities: Edema: Trace    Cardiovascular: normal heart rate noted  Respiratory: normal respiratory effort, no distress  Abdomen: gravid, soft, non-tender  Pelvic: Cervical exam performed         Fetal Status: Fetal Heart Rate (bpm): 144   Movement: Present    Fetal Surveillance Testing today: BPP 8/8   No results found for this or any previous visit (from the past 24 hour(s)).  Assessment & Plan:  1) High-risk pregnancy [id number] at [redacted]w[redacted]d with an Estimated Date of Delivery: 09/26/18   2) Class A2 DM, stable lantus 20 units qhs, metformin 500 BID  3) Long history of klonipen use,   Meds: No orders of the defined types were placed  in this encounter.   Labs/procedures today: cultures, sonogram 8/8 BPP  Treatment Plan:  Weekly BPP IOL 39 weeks  Reviewed: Term labor symptoms and general obstetric precautions including but not limited to vaginal bleeding, contractions, leaking of fluid and fetal movement were reviewed in detail with the patient.  All questions were answered.  Follow-up: Return in about 1 week (around 09/04/2018) for BPP/sono, HROB.  Orders Placed This Encounter  Procedures  . GC/Chlamydia Probe Amp  . Culture, beta strep (group b only)  . POC Urinalysis Dipstick OB   Florian Buff  08/28/2018 3:52 PM

## 2018-08-29 LAB — GC/CHLAMYDIA PROBE AMP
Chlamydia trachomatis, NAA: NEGATIVE
Neisseria Gonorrhoeae by PCR: NEGATIVE

## 2018-08-30 ENCOUNTER — Telehealth: Payer: Self-pay | Admitting: Obstetrics & Gynecology
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
## 2018-08-31 ENCOUNTER — Other Ambulatory Visit: Payer: Self-pay | Admitting: Women's Health

## 2018-08-31 MED ORDER — CLONAZEPAM 1 MG PO TABS: ORAL_TABLET | ORAL | 0 refills | Status: DC
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
## 2018-09-01 ENCOUNTER — Other Ambulatory Visit: Payer: Self-pay | Admitting: Obstetrics & Gynecology

## 2018-09-01 DIAGNOSIS — O24419 Gestational diabetes mellitus in pregnancy, unspecified control: Secondary | ICD-10-CM

## 2018-09-01 LAB — CULTURE, BETA STREP (GROUP B ONLY): Strep Gp B Culture: NEGATIVE

## 2018-09-04 ENCOUNTER — Ambulatory Visit (INDEPENDENT_AMBULATORY_CARE_PROVIDER_SITE_OTHER): Payer: Medicaid Other

## 2018-09-04 ENCOUNTER — Other Ambulatory Visit: Payer: Self-pay

## 2018-09-04 ENCOUNTER — Ambulatory Visit (INDEPENDENT_AMBULATORY_CARE_PROVIDER_SITE_OTHER): Payer: Medicaid Other | Admitting: Obstetrics and Gynecology

## 2018-09-04 VITALS — BP 115/72 | HR 107 | Wt 156.0 lb

## 2018-09-04 DIAGNOSIS — Z3A36 36 weeks gestation of pregnancy: Secondary | ICD-10-CM | POA: Diagnosis not present

## 2018-09-04 DIAGNOSIS — O09899 Supervision of other high risk pregnancies, unspecified trimester: Secondary | ICD-10-CM

## 2018-09-04 DIAGNOSIS — O0993 Supervision of high risk pregnancy, unspecified, third trimester: Secondary | ICD-10-CM

## 2018-09-04 DIAGNOSIS — O24419 Gestational diabetes mellitus in pregnancy, unspecified control: Secondary | ICD-10-CM

## 2018-09-04 NOTE — Progress Notes (Signed)
[place] 08+8 wks,cephalic,anterior placenta gr 3,afi 14.7 cm,fhr 152 bpm,normal ovaries bilat,BPP 8/8,EFW 3450 g 87%,ac 98%

## 2018-09-04 NOTE — Progress Notes (Signed)
   HIGH-RISK PREGNANCY VISIT Patient name: Patricia Richards MRN [id number]  Date of birth: [birth year] Chief Complaint:   Routine Prenatal Visit  History of Present Illness:   Patricia Richards Patricia Richards is a 28 y.o. [id number] female at [redacted]w[redacted]d with an Estimated Date of Delivery: 09/26/18 being seen today for ongoing management of a high-risk pregnancy complicated by gestational DM.  On Lantus 20 at bedtime +5 mg glyburide twice daily Today she reports Lots of suprapubic pressure, additionally blood sugars have been increasing slightly.  Review of baby prescription results shows that she only has one abnormal fasting blood sugar the rest are in the 90s or less.  She has several blood sugars in the 120-136 range  in the afternoon. Contractions: Irregular. Vag. Bleeding: None.  Movement: Present. denies leaking of fluid.  Review of Systems:   Pertinent items are noted in HPI Denies abnormal vaginal discharge w/ itching/odor/irritation, headaches, visual changes, shortness of breath, chest pain, abdominal pain, severe nausea/vomiting, or problems with urination or bowel movements unless otherwise stated above. Pertinent History Reviewed:  Reviewed past medical,surgical, social, obstetrical and family history.  Reviewed problem list, medications and allergies. Physical Assessment:   Vitals:   09/04/18 1623  BP: 115/72  Pulse: (!) 107  Weight: 156 lb (70.8 kg)  Body mass index is 27.63 kg/m.           Physical Examination:   General appearance: alert, well appearing, and in no distress, oriented to person, place, and time, normal appearing weight and chronically ill appearing  Mental status: alert, oriented to person, place, and time, normal mood, behavior, speech, dress, motor activity, and thought processes  Skin: warm & dry   Extremities: Edema: Trace    Cardiovascular: normal heart rate noted  Respiratory: normal respiratory effort, no distress  Abdomen: gravid, soft, non-tender  Pelvic:  Cervical exam performed       Cervix 1 cm long firm -2 vertex well applied  Fetal Status:     Movement: Present    Fetal Surveillance Testing today: BPP 6/2UQ 33+3 wks,cephalic,anterior placenta gr 3,afi 14.7 cm,fhr 152 bpm,normal ovaries bilat,BPP 8/8,EFW 3450 g 87%,ac 98%   Amber J Glendell Docker 09/04/2018 4:29 PM  No results found for this or any previous visit (from the past 24 hour(s)).  Assessment & Plan:  1) High-risk pregnancy [id number] at [redacted]w[redacted]d with an Estimated Date of Delivery: 09/26/18   2) fetal wellbeing indicated by BPP 8/8, stable  3) A2/B diabetes, unstable suboptimal control, increase to glyburide 10 mg every morning, 5 mg nightly along with the Lantus 20 mg at bedtime  Meds: No orders of the defined types were placed in this encounter.   Labs/procedures today:   Treatment Plan:   unstable suboptimal control, increase to glyburide 10 mg every morning, 5 mg nightly along with the Lantus 20 mg at bedtime    Reviewed:  labor symptoms and general obstetric precautions including but not limited to vaginal bleeding, contractions, leaking of fluid and fetal movement were reviewed in detail with the patient.  All questions were answered.  Follow-up: 1 week for BPP, office visit review of CBGs  No orders of the defined types were placed in this encounter.  Jonnie Kind CNM, [facility] 09/04/2018 5:15 PM

## 2018-09-05 ENCOUNTER — Encounter: Payer: Medicaid Other | Admitting: Obstetrics & Gynecology

## 2018-09-11 ENCOUNTER — Encounter: Payer: Self-pay | Admitting: *Deleted

## 2018-09-11 ENCOUNTER — Other Ambulatory Visit: Payer: Self-pay | Admitting: Obstetrics and Gynecology

## 2018-09-11 DIAGNOSIS — O24419 Gestational diabetes mellitus in pregnancy, unspecified control: Secondary | ICD-10-CM

## 2018-09-12 ENCOUNTER — Ambulatory Visit (INDEPENDENT_AMBULATORY_CARE_PROVIDER_SITE_OTHER): Payer: Medicaid Other | Admitting: Obstetrics & Gynecology

## 2018-09-12 ENCOUNTER — Telehealth (HOSPITAL_COMMUNITY): Payer: Self-pay | Admitting: *Deleted

## 2018-09-12 ENCOUNTER — Other Ambulatory Visit: Payer: Self-pay

## 2018-09-12 ENCOUNTER — Encounter (HOSPITAL_COMMUNITY): Payer: Self-pay | Admitting: *Deleted

## 2018-09-12 ENCOUNTER — Ambulatory Visit (INDEPENDENT_AMBULATORY_CARE_PROVIDER_SITE_OTHER): Payer: Medicaid Other

## 2018-09-12 VITALS — BP 107/63 | HR 93 | Wt 155.5 lb

## 2018-09-12 DIAGNOSIS — Z79899 Other long term (current) drug therapy: Secondary | ICD-10-CM

## 2018-09-12 DIAGNOSIS — O09899 Supervision of other high risk pregnancies, unspecified trimester: Secondary | ICD-10-CM

## 2018-09-12 DIAGNOSIS — O24419 Gestational diabetes mellitus in pregnancy, unspecified control: Secondary | ICD-10-CM

## 2018-09-12 DIAGNOSIS — O0993 Supervision of high risk pregnancy, unspecified, third trimester: Secondary | ICD-10-CM

## 2018-09-12 DIAGNOSIS — Z3A38 38 weeks gestation of pregnancy: Secondary | ICD-10-CM

## 2018-09-12 DIAGNOSIS — Z331 Pregnant state, incidental: Secondary | ICD-10-CM

## 2018-09-12 DIAGNOSIS — Z1389 Encounter for screening for other disorder: Secondary | ICD-10-CM

## 2018-09-12 LAB — POCT URINALYSIS DIPSTICK OB
Blood, UA: NEGATIVE
Glucose, UA: NEGATIVE
Ketones, UA: NEGATIVE
Leukocytes, UA: NEGATIVE
Nitrite, UA: NEGATIVE
POC,PROTEIN,UA: NEGATIVE

## 2018-09-12 NOTE — Treatment Plan (Signed)
   Induction Assessment Scheduling Form: Fax to [facility]:  [phone]  Adrienne Trombetta                                                                                   DOB:  1990-09-21                                                            MRN:  [id number]                                                                     Phone #:                            Provider:  [facility]  GP:  [id number]                                                            Estimated Date of Delivery: 09/26/18  Dating Criteria: LMP 1st trimester sonogram    Medical Indications for induction:  A2 DM Admission Date/Time:  09/19/2018 @0730  Gestational age on admission:  [redacted]w[redacted]d   Filed Weights   09/12/18 0856  Weight: 155 lb 8 oz (70.5 kg)   HIV:  Non Reactive (02/06 0918) [id number]    2 cm dilated, th effaced, -3 station, cervical position post, consistency firm, Bishop score 2, presenting part vertex   Method of induction(proposed):  cytotec   Scheduling Provider Signature:  Florian Buff, MD                                            Today's Date:  09/12/2018

## 2018-09-12 NOTE — Progress Notes (Signed)
[place] 38 wks,cephalic,BPP 3/8,ITJLLVDI placenta gr 3,AFI 13.9 cm,normal ovaries bilat,fhr 142 bpm,

## 2018-09-12 NOTE — Progress Notes (Signed)
   HIGH-RISK PREGNANCY VISIT Patient name: Patricia Richards MRN [id number]  Date of birth: 12-14-1990 Chief Complaint:   Routine Prenatal Visit  History of Present Illness:   Patricia Richards Patricia Richards is a 28 y.o. [id number] female at [redacted]w[redacted]d with an Estimated Date of Delivery: 09/26/18 being seen today for ongoing management of a high-risk pregnancy complicated by class  A2 DM.  Today she reports no complaints. Contractions: Irregular. Vag. Bleeding: None.  Movement: Present. denies leaking of fluid.  Review of Systems:   Pertinent items are noted in HPI Denies abnormal vaginal discharge w/ itching/odor/irritation, headaches, visual changes, shortness of breath, chest pain, abdominal pain, severe nausea/vomiting, or problems with urination or bowel movements unless otherwise stated above. Pertinent History Reviewed:  Reviewed past medical,surgical, social, obstetrical and family history.  Reviewed problem list, medications and allergies. Physical Assessment:   Vitals:   09/12/18 0856  BP: 107/63  Pulse: 93  Weight: 155 lb 8 oz (70.5 kg)  Body mass index is 27.55 kg/m.           Physical Examination:   General appearance: alert, well appearing, and in no distress  Mental status: alert, oriented to person, place, and time  Skin: warm & dry   Extremities: Edema: Trace    Cardiovascular: normal heart rate noted  Respiratory: normal respiratory effort, no distress  Abdomen: gravid, soft, non-tender  Pelvic: Cervical exam performed       2/th/-2  Fetal Status:     Movement: Present    Fetal Surveillance Testing today: BPP 8/8   Results for orders placed or performed in visit on 09/12/18 (from the past 24 hour(s))  POC Urinalysis Dipstick OB   Collection Time: 09/12/18  8:57 AM  Result Value Ref Range   Color, UA     Clarity, UA     Glucose, UA Negative Negative   Bilirubin, UA     Ketones, UA neg    Spec Grav, UA     Blood, UA neg    pH, UA     POC,PROTEIN,UA Negative  Negative, Trace, Small (1+), Moderate (2+), Large (3+), 4+   Urobilinogen, UA     Nitrite, UA neg    Leukocytes, UA Negative Negative   Appearance     Odor      Assessment & Plan:  1) High-risk pregnancy [id number] at [redacted]w[redacted]d with an Estimated Date of Delivery: 09/26/18   2) Class A2 DM, stable, not perfect numbers but overall good, a couple elvated fastings, increase lantus 24 units  3)chronic use of klonipen  IOL 09/19/2018  Meds: No orders of the defined types were placed in this encounter.   Labs/procedures today: BPP 8/8  Treatment Plan:  IOL 39 weeks  Reviewed: Term labor symptoms and general obstetric precautions including but not limited to vaginal bleeding, contractions, leaking of fluid and fetal movement were reviewed in detail with the patient.  All questions were answered.  Follow-up: Return in about 6 weeks (around 10/24/2018) for post partum visit.  Orders Placed This Encounter  Procedures  . POC Urinalysis Dipstick OB   Patricia Richards  09/12/2018 10:00 AM

## 2018-09-12 NOTE — Telephone Encounter (Signed)
Preadmission screen  

## 2018-09-13 ENCOUNTER — Other Ambulatory Visit: Payer: Self-pay | Admitting: Advanced Practice Midwife

## 2018-09-18 ENCOUNTER — Other Ambulatory Visit (HOSPITAL_COMMUNITY): Payer: Self-pay | Admitting: *Deleted

## 2018-09-19 ENCOUNTER — Other Ambulatory Visit: Payer: Self-pay

## 2018-09-19 ENCOUNTER — Inpatient Hospital Stay (HOSPITAL_COMMUNITY): Payer: Medicaid Other | Admitting: Anesthesiology

## 2018-09-19 ENCOUNTER — Inpatient Hospital Stay (HOSPITAL_COMMUNITY)
Admission: AD | Admit: 2018-09-19 | Discharge: 2018-09-21 | DRG: 806 | Disposition: A | Discharge status: Home / Self Care | Payer: Medicaid Other | Attending: Family Medicine | Admitting: Family Medicine

## 2018-09-19 ENCOUNTER — Encounter (HOSPITAL_COMMUNITY): Payer: Self-pay

## 2018-09-19 ENCOUNTER — Inpatient Hospital Stay (HOSPITAL_COMMUNITY): Payer: Medicaid Other

## 2018-09-19 DIAGNOSIS — Z8659 Personal history of other mental and behavioral disorders: Secondary | ICD-10-CM

## 2018-09-19 DIAGNOSIS — Z87891 Personal history of nicotine dependence: Secondary | ICD-10-CM

## 2018-09-19 DIAGNOSIS — F1390 Sedative, hypnotic, or anxiolytic use, unspecified, uncomplicated: Secondary | ICD-10-CM | POA: Diagnosis present

## 2018-09-19 DIAGNOSIS — Z3A39 39 weeks gestation of pregnancy: Secondary | ICD-10-CM

## 2018-09-19 DIAGNOSIS — O9832 Other infections with a predominantly sexual mode of transmission complicating childbirth: Secondary | ICD-10-CM | POA: Diagnosis present

## 2018-09-19 DIAGNOSIS — R87619 Unspecified abnormal cytological findings in specimens from cervix uteri: Secondary | ICD-10-CM | POA: Diagnosis present

## 2018-09-19 DIAGNOSIS — O99324 Drug use complicating childbirth: Secondary | ICD-10-CM | POA: Diagnosis present

## 2018-09-19 DIAGNOSIS — F1991 Other psychoactive substance use, unspecified, in remission: Secondary | ICD-10-CM

## 2018-09-19 DIAGNOSIS — A6000 Herpesviral infection of urogenital system, unspecified: Secondary | ICD-10-CM | POA: Diagnosis present

## 2018-09-19 DIAGNOSIS — F418 Other specified anxiety disorders: Secondary | ICD-10-CM | POA: Diagnosis present

## 2018-09-19 DIAGNOSIS — O24424 Gestational diabetes mellitus in childbirth, insulin controlled: Principal | ICD-10-CM | POA: Diagnosis present

## 2018-09-19 DIAGNOSIS — O99344 Other mental disorders complicating childbirth: Secondary | ICD-10-CM | POA: Diagnosis present

## 2018-09-19 DIAGNOSIS — O26893 Other specified pregnancy related conditions, third trimester: Secondary | ICD-10-CM | POA: Diagnosis present

## 2018-09-19 DIAGNOSIS — O24420 Gestational diabetes mellitus in childbirth, diet controlled: Secondary | ICD-10-CM

## 2018-09-19 DIAGNOSIS — Z79899 Other long term (current) drug therapy: Secondary | ICD-10-CM

## 2018-09-19 DIAGNOSIS — O0993 Supervision of high risk pregnancy, unspecified, third trimester: Secondary | ICD-10-CM

## 2018-09-19 DIAGNOSIS — O24419 Gestational diabetes mellitus in pregnancy, unspecified control: Secondary | ICD-10-CM

## 2018-09-19 DIAGNOSIS — O09899 Supervision of other high risk pregnancies, unspecified trimester: Secondary | ICD-10-CM

## 2018-09-19 DIAGNOSIS — Z87898 Personal history of other specified conditions: Secondary | ICD-10-CM

## 2018-09-19 DIAGNOSIS — Z6791 Unspecified blood type, Rh negative: Secondary | ICD-10-CM | POA: Diagnosis not present

## 2018-09-19 DIAGNOSIS — O26899 Other specified pregnancy related conditions, unspecified trimester: Secondary | ICD-10-CM

## 2018-09-19 LAB — CBC
HCT: 30.4 % — ABNORMAL LOW (ref 36.0–46.0)
Hemoglobin: 9.9 g/dL — ABNORMAL LOW (ref 12.0–15.0)
MCH: 29 pg (ref 26.0–34.0)
MCHC: 32.6 g/dL (ref 30.0–36.0)
MCV: 89.1 fL (ref 80.0–100.0)
Platelets: 172 10*3/uL (ref 150–400)
RBC: 3.41 MIL/uL — ABNORMAL LOW (ref 3.87–5.11)
RDW: 15.4 % (ref 11.5–15.5)
WBC: 7.3 10*3/uL (ref 4.0–10.5)
nRBC: 0 % (ref 0.0–0.2)

## 2018-09-19 LAB — GLUCOSE, CAPILLARY
Glucose-Capillary: 123 mg/dL — ABNORMAL HIGH (ref 70–99)
Glucose-Capillary: 78 mg/dL (ref 70–99)
Glucose-Capillary: 66 mg/dL — ABNORMAL LOW (ref 70–99)
Glucose-Capillary: 67 mg/dL — ABNORMAL LOW (ref 70–99)
Glucose-Capillary: 71 mg/dL (ref 70–99)
Glucose-Capillary: 78 mg/dL (ref 70–99)
Glucose-Capillary: 62 mg/dL — ABNORMAL LOW (ref 70–99)
Glucose-Capillary: 105 mg/dL — ABNORMAL HIGH (ref 70–99)
Glucose-Capillary: 61 mg/dL — ABNORMAL LOW (ref 70–99)

## 2018-09-19 LAB — RAPID URINE DRUG SCREEN, HOSP PERFORMED
Amphetamines: NOT DETECTED
Barbiturates: NOT DETECTED
Benzodiazepines: NOT DETECTED
Cocaine: NOT DETECTED
Opiates: NOT DETECTED
Tetrahydrocannabinol: NOT DETECTED

## 2018-09-19 LAB — RPR: RPR Ser Ql: NONREACTIVE

## 2018-09-19 MED ORDER — EPHEDRINE 5 MG/ML INJ: 10.0000 mg | INTRAVENOUS | Status: DC | PRN

## 2018-09-19 MED ORDER — OXYTOCIN 40 UNITS IN NORMAL SALINE INFUSION - SIMPLE MED
2.5000 [IU]/h | INTRAVENOUS | Status: DC
  Administered 2018-09-19: 22:00:00 2.5 [IU]/h via INTRAVENOUS

## 2018-09-19 MED ORDER — SENNOSIDES-DOCUSATE SODIUM 8.6-50 MG PO TABS
2.0000 | ORAL_TABLET | ORAL | Status: DC
  Administered 2018-09-19 – 2018-09-21 (×2): 2 via ORAL
  Filled 2018-09-19 (×2): qty 2

## 2018-09-19 MED ORDER — ACETAMINOPHEN 325 MG PO TABS
650.0000 mg | ORAL_TABLET | ORAL | Status: DC | PRN
  Administered 2018-09-20 – 2018-09-21 (×5): 650 mg via ORAL
  Filled 2018-09-19 (×5): qty 2

## 2018-09-19 MED ORDER — ACETAMINOPHEN 325 MG PO TABS
650.0000 mg | ORAL_TABLET | ORAL | Status: DC | PRN
  Administered 2018-09-19: 22:00:00 650 mg via ORAL
  Filled 2018-09-19: qty 2

## 2018-09-19 MED ORDER — OXYTOCIN BOLUS FROM INFUSION
500.0000 mL | Freq: Once | INTRAVENOUS | Status: AC
  Administered 2018-09-19: 21:00:00 500 mL via INTRAVENOUS

## 2018-09-19 MED ORDER — LACTATED RINGERS IV SOLN
500.0000 mL | INTRAVENOUS | Status: DC | PRN
  Administered 2018-09-19: 1000 mL via INTRAVENOUS

## 2018-09-19 MED ORDER — LACTATED RINGERS IV SOLN
INTRAVENOUS | Status: DC
  Administered 2018-09-19 (×3): via INTRAVENOUS

## 2018-09-19 MED ORDER — ONDANSETRON HCL 4 MG/2ML IJ SOLN: 4.0000 mg | Freq: Four times a day (QID) | INTRAMUSCULAR | Status: DC | PRN

## 2018-09-19 MED ORDER — TERBUTALINE SULFATE 1 MG/ML IJ SOLN: 0.2500 mg | Freq: Once | INTRAMUSCULAR | Status: DC | PRN

## 2018-09-19 MED ORDER — ONDANSETRON HCL 4 MG/2ML IJ SOLN: 4.0000 mg | INTRAMUSCULAR | Status: DC | PRN

## 2018-09-19 MED ORDER — PRENATAL MULTIVITAMIN CH
1.0000 | ORAL_TABLET | Freq: Every day | ORAL | Status: DC
  Administered 2018-09-20: 1 via ORAL
  Filled 2018-09-19: qty 1

## 2018-09-19 MED ORDER — IBUPROFEN 600 MG PO TABS
600.0000 mg | ORAL_TABLET | Freq: Four times a day (QID) | ORAL | Status: DC
  Administered 2018-09-19 – 2018-09-20 (×2): 600 mg via ORAL
  Filled 2018-09-19 (×3): qty 1

## 2018-09-19 MED ORDER — PHENYLEPHRINE 40 MCG/ML (10ML) SYRINGE FOR IV PUSH (FOR BLOOD PRESSURE SUPPORT): 80.0000 ug | PREFILLED_SYRINGE | INTRAVENOUS | Status: DC | PRN

## 2018-09-19 MED ORDER — WITCH HAZEL-GLYCERIN EX PADS: 1.0000 "application " | MEDICATED_PAD | CUTANEOUS | Status: DC | PRN

## 2018-09-19 MED ORDER — DIBUCAINE (PERIANAL) 1 % EX OINT: 1.0000 "application " | TOPICAL_OINTMENT | CUTANEOUS | Status: DC | PRN

## 2018-09-19 MED ORDER — MEASLES, MUMPS & RUBELLA VAC IJ SOLR: 0.5000 mL | Freq: Once | INTRAMUSCULAR | Status: DC

## 2018-09-19 MED ORDER — SODIUM CHLORIDE (PF) 0.9 % IJ SOLN
INTRAMUSCULAR | Status: DC | PRN
  Administered 2018-09-19: 14 mL/h via EPIDURAL

## 2018-09-19 MED ORDER — GLYBURIDE 5 MG PO TABS: 5.0000 mg | ORAL_TABLET | Freq: Two times a day (BID) | ORAL | Status: DC

## 2018-09-19 MED ORDER — MISOPROSTOL 25 MCG QUARTER TABLET
25.0000 ug | ORAL_TABLET | ORAL | Status: DC | PRN
  Filled 2018-09-19: qty 1

## 2018-09-19 MED ORDER — SOD CITRATE-CITRIC ACID 500-334 MG/5ML PO SOLN
30.0000 mL | ORAL | Status: DC | PRN
  Administered 2018-09-19: 19:00:00 30 mL via ORAL
  Filled 2018-09-19: qty 30

## 2018-09-19 MED ORDER — ZOLPIDEM TARTRATE 5 MG PO TABS: 5.0000 mg | ORAL_TABLET | Freq: Every evening | ORAL | Status: DC | PRN

## 2018-09-19 MED ORDER — LIDOCAINE HCL (PF) 1 % IJ SOLN: 30.0000 mL | INTRAMUSCULAR | Status: DC | PRN

## 2018-09-19 MED ORDER — LIDOCAINE HCL (PF) 1 % IJ SOLN
INTRAMUSCULAR | Status: DC | PRN
  Administered 2018-09-19: 11 mL via EPIDURAL

## 2018-09-19 MED ORDER — TETANUS-DIPHTH-ACELL PERTUSSIS 5-2.5-18.5 LF-MCG/0.5 IM SUSP: 0.5000 mL | Freq: Once | INTRAMUSCULAR | Status: DC

## 2018-09-19 MED ORDER — LACTATED RINGERS IV SOLN: 500.0000 mL | Freq: Once | INTRAVENOUS | Status: DC

## 2018-09-19 MED ORDER — COCONUT OIL OIL: 1.0000 "application " | TOPICAL_OIL | Status: DC | PRN

## 2018-09-19 MED ORDER — OXYTOCIN 40 UNITS IN NORMAL SALINE INFUSION - SIMPLE MED
1.0000 m[IU]/min | INTRAVENOUS | Status: DC
  Administered 2018-09-19: 2 m[IU]/min via INTRAVENOUS
  Filled 2018-09-19: qty 1000

## 2018-09-19 MED ORDER — FENTANYL-BUPIVACAINE-NACL 0.5-0.125-0.9 MG/250ML-% EP SOLN
EPIDURAL | Status: AC
  Filled 2018-09-19: qty 250

## 2018-09-19 MED ORDER — DIPHENHYDRAMINE HCL 50 MG/ML IJ SOLN: 12.5000 mg | INTRAMUSCULAR | Status: DC | PRN

## 2018-09-19 MED ORDER — DIPHENHYDRAMINE HCL 25 MG PO CAPS: 25.0000 mg | ORAL_CAPSULE | Freq: Four times a day (QID) | ORAL | Status: DC | PRN

## 2018-09-19 MED ORDER — CLONAZEPAM 0.5 MG PO TABS
1.0000 mg | ORAL_TABLET | Freq: Three times a day (TID) | ORAL | Status: DC | PRN
  Administered 2018-09-19 – 2018-09-21 (×3): 1 mg via ORAL
  Filled 2018-09-19 (×4): qty 2

## 2018-09-19 MED ORDER — FENTANYL-BUPIVACAINE-NACL 0.5-0.125-0.9 MG/250ML-% EP SOLN: 12.0000 mL/h | EPIDURAL | Status: DC | PRN

## 2018-09-19 MED ORDER — CLONAZEPAM 1 MG PO TABS
1.0000 mg | ORAL_TABLET | Freq: Once | ORAL | Status: AC
  Administered 2018-09-19: 19:00:00 1 mg via ORAL
  Filled 2018-09-19: qty 1

## 2018-09-19 MED ORDER — SIMETHICONE 80 MG PO CHEW: 80.0000 mg | CHEWABLE_TABLET | ORAL | Status: DC | PRN

## 2018-09-19 MED ORDER — BENZOCAINE-MENTHOL 20-0.5 % EX AERO: 1.0000 "application " | INHALATION_SPRAY | CUTANEOUS | Status: DC | PRN

## 2018-09-19 MED ORDER — ONDANSETRON HCL 4 MG PO TABS: 4.0000 mg | ORAL_TABLET | ORAL | Status: DC | PRN

## 2018-09-19 NOTE — Progress Notes (Signed)
Hypoglycemic Event  CBG: 62  Treatment: 8oz orange juice  Symptoms: none  Follow-up CBG: Time: 2145 CBG Result:61  Possible Reasons for Event: Lack of food due to labor   Comments/MD notified: Gave another 8oz of juice, will recheck     [place] B Maddie Brazier

## 2018-09-19 NOTE — Progress Notes (Signed)
LABOR PROGRESS NOTE  Patricia Richards is a 28 y.o. [id number] at [redacted]w[redacted]d  admitted for IOL for A2GDM.   Subjective: Feeling much more uncomfortable with contractions, needing to breathe through them.   Objective: BP 102/70   Pulse 87   Temp 98 F (36.7 C) (Oral)   Resp 18   Ht 5\' 4"  (1.626 m)   Wt 71.7 kg   LMP 12/20/2017   SpO2 98%   BMI 27.12 kg/m  or  Vitals:   09/19/18 1031 09/19/18 1101 09/19/18 1131 09/19/18 1201  BP: 110/74 105/68 106/65 102/70  Pulse: 90 94 92 87  Resp: 18 18 18 18   Temp:    98 F (36.7 C)  TempSrc:    Oral  SpO2:      Weight:      Height:        Dilation: 4 Effacement (%): 50 Cervical Position: Posterior Station: -2 Presentation: Vertex Exam by:: Dr Robinette Haines: baseline rate 145, moderate varibility, +acel, no decel Toco: q2-4 min   Labs: Lab Results  Component Value Date   WBC 7.3 09/19/2018   HGB 9.9 (L) 09/19/2018   HCT 30.4 (L) 09/19/2018   MCV 89.1 09/19/2018   PLT 172 09/19/2018    Patient Active Problem List   Diagnosis Date Noted  . GDM, class A2 06/23/2018  . Tachycardia 04/20/2018  . High-risk pregnancy 03/02/2018  . Short interval between pregnancies affecting pregnancy, antepartum 03/02/2018  . History of drug use 02/13/2018  . History of illicit drug use 84/69/6295  . 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  . Abnormal Pap smear of cervix 05/20/2015  . Depression with anxiety 05/13/2015  . Hallucinations 05/13/2015  . History of suicidal ideation 05/13/2015  . History of alcohol abuse 03/18/2015  . Rh negative state in antepartum period 08/28/2013    Assessment / Plan: 28 y.o. [id number] at 107w0d here for IOL for A2GDM. Most recent CBG 66, given some juice.   Labor: Induction on pitocin at 6 mu/min contracting painfully and regularly. AROM performed at 1230 with moderate amount of clear fluid.  Fetal Wellbeing:  Cat I  Pain Control:  Declines epidural. Maternal support.  Anticipated MOD:  NSVD    Phill Myron, D.O. OB Fellow  09/19/2018, 12:47 PM

## 2018-09-19 NOTE — H&P (Signed)
LABOR AND DELIVERY ADMISSION HISTORY AND PHYSICAL NOTE  Patricia Richards is a 28 y.o. female [phone] with IUP at 38w0dby LMP c/w 8 week sono presenting for IOL for A2GDM.  She reports positive fetal movement. She denies leakage of fluid or vaginal bleeding.  Prenatal History/Complications: PNC at [facility] established at 10 weeks  Pregnancy complications:  - AM7BUY(lantus 24u QHS, reports that she has never taken glyburide because it made her feel sick), EFW 87% (3450g)/AC 98% at 360w6d-Rh negative  -history of depression with anxiety (takes klonopin, reports she has not ever taken lexapro)  -h/o illicit drug use, will obtain UDS at admit  -h/o HSV on acyclovir, no recent outbreaks or prodromal symptoms    Past Medical History: Past Medical History:  Diagnosis Date  . 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 Anemia   . Bacterial infection   . Bipolar 1 disorder (HCJakin  . Depression    on meds now, doing well  . Gestational diabetes   . Herpes   . Infection    UTI  . Kidney stones   . 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 Panic attacks   . Pregnant   . Urinary tract infection    gets them frequently  . Vaginal Pap smear, abnormal     Past Surgical History: Past Surgical History:  Procedure Laterality Date  . DILATION AND EVACUATION  11/28/2010    Obstetrical History: OB History    Gravida  6   Para  4   Term  4   Preterm  0   AB  1   Living  4     SAB  0   TAB  1   Ectopic  0   Multiple  0   Live Births  4           Social History: Social History   Socioeconomic History  . Marital status: Married    Spouse name: RaLouie Casa. Number of children: 4  . Years of education: Not on file  . Highest education level: Not on file  Occupational History  . Not on file  Social Needs  . Financial resource strain: Not hard at all  . Food insecurity:    Worry: Never true   Inability: Never true  . Transportation needs:    Medical: Yes    Non-medical: Yes  Tobacco Use  . Smoking status: Former Smoker    Types: Cigarettes    Last attempt to quit: 02/14/2018    Years since quitting: 0.5  . Smokeless tobacco: Never Used  Substance and Sexual Activity  . Alcohol use: No    Alcohol/week: 0.0 standard drinks    Comment: occasionally, not during pregnancy  . Drug use: Not Currently    Types: Other-see comments, Marijuana, Cocaine, MDMA (Ecstacy)    Comment: none since April 2018  . Sexual activity: Yes    Birth control/protection: None  Lifestyle  . Physical activity:    Days per week: Patient refused    Minutes per session: Patient refused  . Stress: To some extent  Relationships  . Social connections:    Talks on phone: Three times a week    Gets together: Never    Attends religious service: Never    Active member of club or organization: Yes    Attends meetings of clubs or organizations: Never  Relationship status: Married  Other Topics Concern  . Not on file  Social History Narrative  . Not on file    Family History: Family History  Problem Relation Age of Onset  . Hypertension Father   . Cancer Father        skin  . Mental illness Father   . Cancer Maternal Grandmother   . Mental illness Maternal Grandmother   . Stroke Maternal Grandmother   . Cerebral palsy Mother   . Other Brother        problems with gums  . Heart attack Paternal Grandmother   . Down syndrome Cousin   . Bipolar disorder Cousin     Allergies: Allergies  Allergen Reactions  . Metronidazole Shortness Of Breath    Pt states was her anxiety, not really sob/anaphylaxis  . Tramadol Other (See Comments)    "CAUSES REALLY BAD MOOD SWINGS AND HALLUCINATIONS"  . Toradol [Ketorolac Tromethamine] Other (See Comments)    Hallucinations     Medications Prior to Admission  Medication Sig Dispense Refill Last Dose  . ACCU-CHEK SOFTCLIX LANCETS lancets Use as  instructed to check blood sugar 4 times daily 100 each 12 Taking  . acetaminophen (TYLENOL) 325 MG tablet Take 650 mg by mouth every 6 (six) hours as needed.   Taking  . acyclovir (ZOVIRAX) 400 MG tablet Take 1 tablet (400 mg total) by mouth 3 (three) times daily. 90 tablet 3 Taking  . Blood Glucose Monitoring Suppl (ACCU-CHEK GUIDE ME) w/Device KIT 1 each by Does not apply route 4 (four) times daily. 1 kit 0 Taking  . clonazePAM (KLONOPIN) 1 MG tablet 1 tablet three times daily prn anxiety 90 tablet 0 Taking  . docusate sodium (COLACE) 100 MG capsule Take 1 capsule (100 mg total) by mouth 2 (two) times daily as needed. 30 capsule 2 Taking  . ferrous sulfate 325 (65 FE) MG EC tablet Take 1 tablet (325 mg total) by mouth 2 (two) times daily with a meal. 60 tablet 3 Taking  . glucose blood (ACCU-CHEK GUIDE) test strip Use as instructed to check blood sugar 4 times daily 100 each 12 Taking  . glyBURIDE (DIABETA) 5 MG tablet Take 1 tablet (5 mg total) by mouth 2 (two) times daily with a meal. 60 tablet 1 Taking  . Insulin Glargine (LANTUS) 100 UNIT/ML Solostar Pen Inject 20 Units into the skin at bedtime. 15 mL 11 Taking  . Insulin Pen Needle (PEN NEEDLES) 31G X 6 MM MISC Use with Lantu 30 each 3 Taking  . Prenatal Vit-Fe Fumarate-FA (PNV PRENATAL PLUS MULTIVITAMIN) 27-1 MG TABS Take 1 tablet by mouth daily. 30 tablet 11 Taking     Review of Systems  All systems reviewed and negative except as stated in HPI  Physical Exam Blood pressure 121/81, pulse (!) 115, temperature 97.8 F (36.6 C), temperature source Oral, resp. rate 16, height '5\' 4"'  (1.626 m), weight 71.7 kg, last menstrual period 12/20/2017, SpO2 98 %, not currently breastfeeding. General appearance: alert, oriented, NAD Lungs: normal respiratory effort Heart: regular rate Abdomen: soft, non-tender; gravid, FH appropriate for GA Extremities: No calf swelling or tenderness GU: No herpetic lesions noted.  Presentation: cephalic Fetal  monitoring: 140 bpm, moderate variability, +acels, no decels  Uterine activity: q2-4 min  Dilation: 2 Effacement (%): 30 Station: -2 Exam by:: J.Follmer,RNC  Prenatal labs: ABO, Rh: --/--/A NEG (05/05 0746) Antibody: POS (05/05 0746) Rubella: 2.62 (10/17 1632) RPR: Non Reactive (02/06 0918)  HBsAg: Negative (10/17 1632)  HIV: Non Reactive (02/06 0918)  GC/Chlamydia: Negative  GBS: Negative (04/13 0000)  2-hr GTT: 85/196/126  Genetic screening:  Negative  Anatomy US: normal, bilateral CP cysts resolved on repeat scan   Prenatal Transfer Tool  Maternal Diabetes: Yes:  Diabetes Type:  Insulin/Medication controlled Genetic Screening: Normal Maternal Ultrasounds/Referrals: Normal Fetal Ultrasounds or other Referrals:  None Maternal Substance Abuse:  No Significant Maternal Medications:  Meds include: Other: Insulin, Glyburide, Lexapro, Klonopin, Valtrex  Significant Maternal Lab Results: Lab values include: Group B Strep negative, Rh negative  Results for orders placed or performed during the hospital encounter of 09/19/18 (from the past 24 hour(s))  CBC   Collection Time: 09/19/18  7:33 AM  Result Value Ref Range   WBC 7.3 4.0 - 10.5 K/uL   RBC 3.41 (L) 3.87 - 5.11 MIL/uL   Hemoglobin 9.9 (L) 12.0 - 15.0 g/dL   HCT 30.4 (L) 36.0 - 46.0 %   MCV 89.1 80.0 - 100.0 fL   MCH 29.0 26.0 - 34.0 pg   MCHC 32.6 30.0 - 36.0 g/dL   RDW 15.4 11.5 - 15.5 %   Platelets 172 150 - 400 K/uL   nRBC 0.0 0.0 - 0.2 %  Glucose, capillary   Collection Time: 09/19/18  7:40 AM  Result Value Ref Range   Glucose-Capillary 123 (H) 70 - 99 mg/dL  Type and screen   Collection Time: 09/19/18  7:46 AM  Result Value Ref Range   ABO/RH(D) A NEG    Antibody Screen POS    Sample Expiration      09/22/2018 Performed at [facility], [place] [address] [place], [place] [address]     Patient Active Problem List   Diagnosis Date Noted  . GDM, class A2 06/23/2018  . Tachycardia 04/20/2018  .  High-risk pregnancy 03/02/2018  . Short interval between pregnancies affecting pregnancy, antepartum 03/02/2018  . History of drug use 02/13/2018  . History of illicit drug use 59/56/3875  . 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  . Abnormal Pap smear of cervix 05/20/2015  . Depression with anxiety 05/13/2015  . Hallucinations 05/13/2015  . History of suicidal ideation 05/13/2015  . History of alcohol abuse 03/18/2015  . Rh negative state in antepartum period 08/28/2013    Assessment: Maxyne R Ramiah Helfrich is a 28 y.o. [id number] at 53w0dhere for IOL for A2GDM. CBG 123. Will check q2h.   #Labor: Induction. Cervix favorable and patient is a multip, will start with Pitocin 2x2.  #Pain: Declines epidural.  #FWB: Cat I  #ID:  GBS neg  #MOF: Bottle  #MOC:Copper IUD vs. Nuvaring  #Circ:  N/A   CMelina Schools5/09/2018, 8:49 AM

## 2018-09-19 NOTE — Anesthesia Procedure Notes (Signed)
Epidural Patient location during procedure: OB Start time: 09/19/2018 6:26 PM End time: 09/19/2018 6:35 PM  Staffing Anesthesiologist: Lynda Rainwater, MD Performed: anesthesiologist   Preanesthetic Checklist Completed: patient identified, site marked, surgical consent, pre-op evaluation, timeout performed, IV checked, risks and benefits discussed and monitors and equipment checked  Epidural Patient position: sitting Prep: ChloraPrep Patient monitoring: heart rate, cardiac monitor, continuous pulse ox and blood pressure Approach: midline Location: L2-L3 Injection technique: LOR saline  Needle:  Needle type: Tuohy  Needle gauge: 17 G Needle length: 9 cm Needle insertion depth: 4 cm Catheter type: closed end flexible Catheter size: 20 Guage Catheter at skin depth: 8 cm Test dose: negative  Assessment Events: blood not aspirated, injection not painful, no injection resistance, negative IV test and no paresthesia  Additional Notes Reason for block:procedure for pain

## 2018-09-19 NOTE — Discharge Summary (Signed)
Obstetrics Discharge Summary [facility]   Patient Name: Patricia Richards DOB: November 19, 1990 MRN: [id number]  Date of admission: 09/19/2018 Delivering MD: Glenice Bow   Date of discharge: 09/21/2018  Admitting diagnosis: pregnancy Intrauterine pregnancy: [redacted]w[redacted]d     Secondary diagnosis:   Principal Problem:   GDM, class A2 Active Problems:   Rh negative state in antepartum period   Depression with anxiety   History of suicidal ideation   Abnormal Pap smear of cervix   History of illicit drug use   Long term prescription benzodiazepine use   Short interval between pregnancies affecting pregnancy, antepartum    Discharge diagnosis: Term Pregnancy Delivered                                Postpartum procedures: social work consult Complications: none  Outpatient Follow-Up: [ ]  2-hr GTT [ ]  desires IUD or Nuvaring as contraception   Hospital course: Patricia Richards is a 28 y.o. [redacted]w[redacted]d who was admitted for induction of labor for A2GDM. Her pregnancy was complicated by above noted. Her labor course was notable for induction with pitocin, AROM and epidural placement. Delivery was uncomplicated. Please see delivery/op note for additional details. Her postpartum course was uncomplicated. A social work consult was placed given history of SI, depression/anxiety, and long-term Klonopin use. She will have a 2-hr GTT at her postpartum visit. . By day of discharge, she was passing flatus, urinating, eating and drinking without difficulty. Her pain was well-controlled.  Physical exam  Vitals:   09/20/18 0800 09/20/18 1335 09/20/18 2146 09/21/18 0556  BP: 104/79 102/77 103/64 106/81  Pulse: 67 80 80 83  Resp: 18 17 16 20   Temp: 97.9 F (36.6 C) 97.8 F (36.6 C) 98.2 F (36.8 C) 98.4 F (36.9 C)  TempSrc: Oral Oral Oral Oral  SpO2: 100% 100%    Weight:      Height:       General: alert Lochia: appropriate Uterine Fundus: firm Incision: N/A DVT Evaluation: No  evidence of DVT seen on physical exam. Labs: Lab Results  Component Value Date   WBC 7.3 09/19/2018   HGB 9.9 (L) 09/19/2018   HCT 30.4 (L) 09/19/2018   MCV 89.1 09/19/2018   PLT 172 09/19/2018   CMP Latest Ref Rng & Units 04/20/2017  Glucose 65 - 99 mg/dL 82  BUN 6 - 20 mg/dL 9  Creatinine 0.44 - 1.00 mg/dL 0.44  Sodium 135 - 145 mmol/L 135  Potassium 3.5 - 5.1 mmol/L 3.2(L)  Chloride 101 - 111 mmol/L 104  CO2 22 - 32 mmol/L 23  Calcium 8.9 - 10.3 mg/dL 9.2  Total Protein 6.5 - 8.1 g/dL -  Total Bilirubin 0.3 - 1.2 mg/dL -  Alkaline Phos 38 - 126 U/L -  AST 15 - 41 U/L -  ALT 14 - 54 U/L -    Discharge instructions: Per After Visit Summary and "Baby and Me Booklet"  After visit meds:  Allergies as of 09/21/2018      Reactions   Metronidazole Shortness Of Breath   Pt states was her anxiety, not really sob/anaphylaxis   Tramadol Other (See Comments)   "CAUSES REALLY BAD MOOD SWINGS AND HALLUCINATIONS"   Toradol [ketorolac Tromethamine] Other (See Comments)   Hallucinations      Medication List    STOP taking these medications   glyBURIDE 5 MG tablet Commonly known as:  Diabeta  TAKE these medications   acetaminophen 325 MG tablet Commonly known as:  TYLENOL Take 650 mg by mouth every 6 (six) hours as needed.   acyclovir 400 MG tablet Commonly known as:  ZOVIRAX Take 1 tablet (400 mg total) by mouth 3 (three) times daily.   clonazePAM 1 MG tablet Commonly known as:  KLONOPIN 1 tablet three times daily prn anxiety   docusate sodium 100 MG capsule Commonly known as:  COLACE Take 1 capsule (100 mg total) by mouth 2 (two) times daily as needed.   etonogestrel-ethinyl estradiol 0.12-0.015 MG/24HR vaginal ring Commonly known as:  NUVARING Insert vaginally and leave in place for 3 consecutive weeks, then remove for 1 week. Start this when the baby is 59 weeks old.   ferrous sulfate 325 (65 FE) MG EC tablet Take 1 tablet (325 mg total) by mouth 2 (two) times  daily with a meal.   prenatal multivitamin Tabs tablet Take 1 tablet orally daily What changed:    medication strength  how much to take  how to take this  when to take this  additional instructions       Postpartum contraception: Nuvaring to be started at 2 weeks postpartum Diet: Routine Diet Activity: Advance as tolerated. Pelvic rest for 6 weeks.   Follow-up Appt: Future Appointments  Date Time Provider [place]  10/24/2018  4:00 PM Roma Schanz, CNM [facility]   Follow-up Visit:No follow-ups on file.  Please schedule this patient for Postpartum visit in: 4 weeks with the following provider: Any provider High risk pregnancy complicated by: GDM Delivery mode:  SVD Anticipated Birth Control:  other/unsure PP Procedures needed: 2 hour GTT  Schedule Integrated BH visit: no  Newborn Data: Live born female  Birth Weight: 7-13 APGAR: 67, 9  Newborn Delivery   Birth date/time:  09/19/2018 21:04:00 Delivery type:  Vaginal, Spontaneous     Baby Feeding: Bottle Disposition:home with mother

## 2018-09-19 NOTE — Anesthesia Preprocedure Evaluation (Signed)
Anesthesia Evaluation  Patient identified by MRN, date of birth, ID band Patient awake    Reviewed: Allergy & Precautions, Patient's Chart, lab work & pertinent test results  Airway Mallampati: I  TM Distance: >3 FB Neck ROM: Full    Dental no notable dental hx.    Pulmonary former smoker,    Pulmonary exam normal breath sounds clear to auscultation       Cardiovascular Normal cardiovascular exam Rhythm:Regular Rate:Normal     Neuro/Psych PSYCHIATRIC DISORDERS Anxiety Depression Bipolar Disorder    GI/Hepatic Neg liver ROS,   Endo/Other  negative endocrine ROSdiabetes  Renal/GU      Musculoskeletal   Abdominal   Peds  Hematology negative hematology ROS (+)   Anesthesia Other Findings   Reproductive/Obstetrics (+) Pregnancy                             Anesthesia Physical  Anesthesia Plan  ASA: II  Anesthesia Plan: Epidural   Post-op Pain Management:    Induction:   PONV Risk Score and Plan:   Airway Management Planned:   Additional Equipment:   Intra-op Plan:   Post-operative Plan:   Informed Consent: I have reviewed the patients History and Physical, chart, labs and discussed the procedure including the risks, benefits and alternatives for the proposed anesthesia with the patient or authorized representative who has indicated his/her understanding and acceptance.       Plan Discussed with:   Anesthesia Plan Comments: (Lab Results      Component                Value               Date                      WBC                      10.8 (H)            08/26/2017                HGB                      10.0 (L)            08/26/2017                HCT                      30.7 (L)            08/26/2017                MCV                      87.0                08/26/2017                PLT                      226                 08/26/2017           )         Anesthesia Quick Evaluation

## 2018-09-19 NOTE — Progress Notes (Signed)
LABOR PROGRESS NOTE  Patricia Richards is a 28 y.o. [id number] at [redacted]w[redacted]d  admitted for IOL for A2GDM.   Subjective: Strip note.   Objective: BP 101/78   Pulse (!) 104   Temp 97.7 F (36.5 C) (Oral)   Resp 18   Ht 5\' 4"  (1.626 m)   Wt 71.7 kg   LMP 12/20/2017   SpO2 98%   BMI 27.12 kg/m  or  Vitals:   09/19/18 1531 09/19/18 1601 09/19/18 1619 09/19/18 1631  BP: 105/78 102/69  101/78  Pulse: 98 100  (!) 104  Resp: 18 18  18   Temp:   97.7 F (36.5 C)   TempSrc:   Oral   SpO2:      Weight:      Height:        Dilation: 7 Effacement (%): 60 Cervical Position: Posterior Station: -2 Presentation: Vertex Exam by:: J.Follmer RNC FHT: baseline rate 135, moderate varibility, +acel, no decel Toco: q2-3 min   Labs: Lab Results  Component Value Date   WBC 7.3 09/19/2018   HGB 9.9 (L) 09/19/2018   HCT 30.4 (L) 09/19/2018   MCV 89.1 09/19/2018   PLT 172 09/19/2018    Patient Active Problem List   Diagnosis Date Noted  . GDM, class A2 06/23/2018  . Tachycardia 04/20/2018  . High-risk pregnancy 03/02/2018  . Short interval between pregnancies affecting pregnancy, antepartum 03/02/2018  . History of drug use 02/13/2018  . History of illicit drug use 97/94/8016  . 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  . Abnormal Pap smear of cervix 05/20/2015  . Depression with anxiety 05/13/2015  . Hallucinations 05/13/2015  . History of suicidal ideation 05/13/2015  . History of alcohol abuse 03/18/2015  . Rh negative state in antepartum period 08/28/2013    Assessment / Plan: 28 y.o. [id number] at [redacted]w[redacted]d here for IOL for A2GDM. Most recent CBG 71.  Labor: Induction on pitocin at 18 mu/min with adequate contraction pattern and continued cervical change.  Fetal Wellbeing:  Cat I  Pain Control:  Anesthesia at bedside for epidural placement  Anticipated MOD:  NSVD   Phill Myron, D.O. OB Fellow  09/19/2018, 6:14 PM

## 2018-09-20 DIAGNOSIS — O24419 Gestational diabetes mellitus in pregnancy, unspecified control: Secondary | ICD-10-CM

## 2018-09-20 LAB — GLUCOSE, CAPILLARY: Glucose-Capillary: 74 mg/dL (ref 70–99)
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
## 2018-09-20 NOTE — Progress Notes (Signed)
POSTPARTUM PROGRESS NOTE  Post Partum Day 1  Subjective:  Patricia Richards is a 28 y.o. [id number] s/p SVD at [redacted]w[redacted]d.  She reports she is doing well. No acute events overnight. She denies any problems with ambulating, voiding or po intake. Denies nausea or vomiting.  Pain is moderately controlled.  Lochia is appropriate.  Objective: Blood pressure 104/79, pulse 67, temperature 97.9 F (36.6 C), temperature source Oral, resp. rate 18, height 5\' 4"  (1.626 m), weight 71.7 kg, last menstrual period 12/20/2017, SpO2 100 %, unknown if currently breastfeeding.  Physical Exam:  General: alert, cooperative and no distress Chest: no respiratory distress Heart:regular rate, distal pulses intact Abdomen: soft, nontender,  Uterine Fundus: firm, appropriately tender DVT Evaluation: No calf swelling or tenderness Extremities: No LE edema Skin: warm, dry  Recent Labs    09/19/18 0733  HGB 9.9*  HCT 30.4*    Assessment/Plan: Patricia Richards is a 28 y.o. [id number] s/p SVD at [redacted]w[redacted]d   PPD#1 - Doing well  Routine postpartum care Prefers tylenol for pain over ibuprofen. Encouraged patient to ask RN for this medication.  Contraception: Copper IUD vs. Nuvaring  Feeding: Breast and bottle  Dispo: Plan for discharge PPD#2.   LOS: 1 day   Phill Myron, D.O. OB Fellow  09/20/2018, 10:29 AM
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
## 2018-09-20 NOTE — Anesthesia Postprocedure Evaluation (Signed)
Anesthesia Post Note  Patient: Patricia Richards  Procedure(s) Performed: AN AD [place]     Patient location during evaluation: Mother Baby Anesthesia Type: Epidural Level of consciousness: awake Pain management: satisfactory to patient Vital Signs Assessment: post-procedure vital signs reviewed and stable Respiratory status: spontaneous breathing Cardiovascular status: stable Anesthetic complications: no    Last Vitals:  Vitals:   09/20/18 0000 09/20/18 0400  BP: 111/75 114/70  Pulse: 95 78  Resp: 20 18  Temp: 36.6 C 36.6 C  SpO2: 100% 100%    Last Pain:  Vitals:   09/20/18 0400  TempSrc: Oral  PainSc: 4    Pain Goal:                   [employer]

## 2018-09-20 NOTE — Progress Notes (Signed)
This RN entered the room after hearing multiple episodes of a female voice yelling and the word fuck many times from four rooms down. Upon entering the room found patient sitting in bed crying while talking on the phone with female pacing in room holding infant. Rn ask what was happening and patient then spoke to man stating "I told you to stop yelling." Man stated he wasn't yelling. This RN removed infant from man, placed in crib and explained to him that he was yelling...then asked him to please step out of the room. He then proceeded to yell at patient to give him the phone and stated that this was her fault. Patient continue to cry and ask man to leave. He then sat down and yell that he didn't care if security came and told patient "this is your fault because your a drunk" and then demanded the phone she was talking on stating that is was his phone. RN asked man to leave many more time without cooperation. He then attempted to collect all the bags in the room in order to remove them. Patient yelled at him "you can't have my pills". Female then placed all bags including the one that sounded like it had a few bottles of pills in it back on bedside table. A few more unsuccessful verbal request were made by this RN get him to leave room. He then refused to leave because he could not find a shirt but only tried to pick up the bags again. Bedside RN Jodene Nam then entered the room and also asked female to leave room. She then removed infant from room. Once again this RN asked the man to leave the patient room and warned him that security was on the way. He returned to bedside couch sat and then stormed form the room. This RN then reported to Heide Guile what had transpired.

## 2018-09-21 MED ORDER — ETONOGESTREL-ETHINYL ESTRADIOL 0.12-0.015 MG/24HR VA RING: VAGINAL_RING | VAGINAL | 12 refills | Status: DC

## 2018-09-21 MED ORDER — PRENATAL MULTIVITAMIN CH: ORAL_TABLET | ORAL | 3 refills | Status: DC

## 2018-09-21 NOTE — Discharge Instructions (Signed)
Vaginal Delivery, Care After °Refer to this sheet in the next few weeks. These instructions provide you with information about caring for yourself after vaginal delivery. Your health care provider may also give you more specific instructions. Your treatment has been planned according to current medical practices, but problems sometimes occur. Call your health care provider if you have any problems or questions. °What can I expect after the procedure? °After vaginal delivery, it is common to have: °· Some bleeding from your vagina. °· Soreness in your abdomen, your vagina, and the area of skin between your vaginal opening and your anus (perineum). °· Pelvic cramps. °· Fatigue. °Follow these instructions at home: °Medicines °· Take over-the-counter and prescription medicines only as told by your health care provider. °· If you were prescribed an antibiotic medicine, take it as told by your health care provider. Do not stop taking the antibiotic until it is finished. °Driving ° °· Do not drive or operate heavy machinery while taking prescription pain medicine. °· Do not drive for 24 hours if you received a sedative. °Lifestyle °· Do not drink alcohol. This is especially important if you are breastfeeding or taking medicine to relieve pain. °· Do not use tobacco products, including cigarettes, chewing tobacco, or e-cigarettes. If you need help quitting, ask your health care provider. °Eating and drinking °· Drink at least 8 eight-ounce glasses of water every day unless you are told not to by your health care provider. If you choose to breastfeed your baby, you may need to drink more water than this. °· Eat high-fiber foods every day. These foods may help prevent or relieve constipation. High-fiber foods include: °? Whole grain cereals and breads. °? Brown rice. °? Beans. °? Fresh fruits and vegetables. °Activity °· Return to your normal activities as told by your health care provider. Ask your health care provider what  activities are safe for you. °· Rest as much as possible. Try to rest or take a nap when your baby is sleeping. °· Do not lift anything that is heavier than your baby or 10 lb (4.5 kg) until your health care provider says that it is safe. °· Talk with your health care provider about when you can engage in sexual activity. This may depend on your: °? Risk of infection. °? Rate of healing. °? Comfort and desire to engage in sexual activity. °Vaginal Care °· If you have an episiotomy or a vaginal tear, check the area every day for signs of infection. Check for: °? More redness, swelling, or pain. °? More fluid or blood. °? Warmth. °? Pus or a bad smell. °· Do not use tampons or douches until your health care provider says this is safe. °· Watch for any blood clots that may pass from your vagina. These may look like clumps of dark red, brown, or black discharge. °General instructions °· Keep your perineum clean and dry as told by your health care provider. °· Wear loose, comfortable clothing. °· Wipe from front to back when you use the toilet. °· Ask your health care provider if you can shower or take a bath. If you had an episiotomy or a perineal tear during labor and delivery, your health care provider may tell you not to take baths for a certain length of time. °· Wear a bra that supports your breasts and fits you well. °· If possible, have someone help you with household activities and help care for your baby for at least a few days after you   leave the hospital. °· Keep all follow-up visits for you and your baby as told by your health care provider. This is important. °Contact a health care provider if: °· You have: °? Vaginal discharge that has a bad smell. °? Difficulty urinating. °? Pain when urinating. °? A sudden increase or decrease in the frequency of your bowel movements. °? More redness, swelling, or pain around your episiotomy or vaginal tear. °? More fluid or blood coming from your episiotomy or vaginal  tear. °? Pus or a bad smell coming from your episiotomy or vaginal tear. °? A fever. °? A rash. °? Little or no interest in activities you used to enjoy. °? Questions about caring for yourself or your baby. °· Your episiotomy or vaginal tear feels warm to the touch. °· Your episiotomy or vaginal tear is separating or does not appear to be healing. °· Your breasts are painful, hard, or turn red. °· You feel unusually sad or worried. °· You feel nauseous or you vomit. °· You pass large blood clots from your vagina. If you pass a blood clot from your vagina, save it to show to your health care provider. Do not flush blood clots down the toilet without having your health care provider look at them. °· You urinate more than usual. °· You are dizzy or light-headed. °· You have not breastfed at all and you have not had a menstrual period for 12 weeks after delivery. °· You have stopped breastfeeding and you have not had a menstrual period for 12 weeks after you stopped breastfeeding. °Get help right away if: °· You have: °? Pain that does not go away or does not get better with medicine. °? Chest pain. °? Difficulty breathing. °? Blurred vision or spots in your vision. °? Thoughts about hurting yourself or your baby. °· You develop pain in your abdomen or in one of your legs. °· You develop a severe headache. °· You faint. °· You bleed from your vagina so much that you fill two sanitary pads in one hour. °This information is not intended to replace advice given to you by your health care provider. Make sure you discuss any questions you have with your health care provider. °Document Released: 04/30/2000 Document Revised: 10/15/2015 Document Reviewed: 05/18/2015 °Elsevier Interactive Patient Education © 2019 [employer] ° °

## 2018-09-21 NOTE — Progress Notes (Addendum)
[name] received voicemail from RN regarding incident that took place with FOB yesterday evening. CSW to meet with [name] to offer support and process events.  Update: CSW spoke with [name] at bedside who reported she was doing "fine" after what happened the day prior. [name] stated FOB got upset with her yesterday after CSW's assessment and became upset with her. Per [name], she believes FOB was taking some of her medications and reported that only makes him more anxious. [name] stated her plan after discharge is to have her father pick her and infant up and that she plans to stay with him for the time being. Per [name], her father has all of her children except her second youngest child who is currently with paternal grandmother. CSW inquired about if mother was concerned for that child's safety to which [name] stated she was not. CSW offered to provide [name] information on the [facility] in the event she wanted to take out a 50B. [name] reported she has restraining orders against the two fathers of her other children and is aware of the process. [name] reported she has told FOB to "leave her alone for awhile" and stated he is not allowed at her father's house. [name] stated she intends to schedule an appointment today with her counselor at [facility]. [name] denied any further questions or concerns for CSW at this time.   Ollen Barges, [place]  [facility] [employer] [phone]

## 2018-09-22 LAB — BPAM RBC
Blood Product Expiration Date: 202005192359
Blood Product Expiration Date: 202005262359
Unit Type and Rh: 600
Unit Type and Rh: 600

## 2018-09-22 LAB — TYPE AND SCREEN
ABO/RH(D): A NEG
Antibody Screen: POSITIVE
Unit division: 0
Unit division: 0

## 2018-09-26 ENCOUNTER — Telehealth: Payer: Self-pay | Admitting: Obstetrics & Gynecology

## 2018-09-26 NOTE — Telephone Encounter (Signed)
patient called stating that she would like for Dr. Elonda Husky to call her a refill of her Klonopin to her pharmacy. Pt states she has two left. Please contact pt when done

## 2018-09-27 ENCOUNTER — Other Ambulatory Visit: Payer: Self-pay | Admitting: Obstetrics & Gynecology

## 2018-09-27 ENCOUNTER — Encounter: Payer: Self-pay | Admitting: *Deleted

## 2018-09-27 ENCOUNTER — Telehealth: Payer: Self-pay | Admitting: Obstetrics & Gynecology

## 2018-09-27 MED ORDER — CLONAZEPAM 1 MG PO TABS: ORAL_TABLET | ORAL | 0 refills | Status: DC

## 2018-09-27 NOTE — Telephone Encounter (Signed)
Patient called yesterday for a refill, she's calling back again. She would like for Dr. Elonda Husky to call her a refill of her Klonopin to her pharmacy. Pt states she has two left. Please contact pt when done.  [place]

## 2018-09-27 NOTE — Telephone Encounter (Signed)
Spoke with pt. Pt is requesting a refill on Klonopin. Pt will run out today. Please advise. Thanks!! [place]

## 2018-09-27 NOTE — Telephone Encounter (Signed)
This is the only refill we will do, she needs to get the provider who manages this to manage going forward

## 2018-10-03 ENCOUNTER — Telehealth: Payer: Self-pay | Admitting: *Deleted

## 2018-10-03 NOTE — Telephone Encounter (Signed)
Pt reports that she had her baby two weeks ago. She had sex last night. She put her nuvaring in this morning. She is bleeding heavier today. She doesn't want to take the nuvaring anymore. She thinks it is causing this. She doesn't want to do depo. Wanted to see if we had any other ideas that would work for her. She has been sexually active multiple times.

## 2018-10-10 ENCOUNTER — Telehealth: Payer: Self-pay | Admitting: *Deleted

## 2018-10-10 ENCOUNTER — Telehealth: Payer: Self-pay | Admitting: Women's Health

## 2018-10-10 ENCOUNTER — Other Ambulatory Visit: Payer: Self-pay

## 2018-10-10 ENCOUNTER — Emergency Department (HOSPITAL_COMMUNITY): Payer: Medicaid Other

## 2018-10-10 ENCOUNTER — Emergency Department (HOSPITAL_COMMUNITY)
Admission: EM | Admit: 2018-10-10 | Discharge: 2018-10-10 | Disposition: A | Discharge status: Home / Self Care | Payer: Medicaid Other | Attending: Emergency Medicine | Admitting: Emergency Medicine

## 2018-10-10 ENCOUNTER — Encounter (HOSPITAL_COMMUNITY): Payer: Self-pay | Admitting: *Deleted

## 2018-10-10 DIAGNOSIS — R1011 Right upper quadrant pain: Secondary | ICD-10-CM | POA: Insufficient documentation

## 2018-10-10 DIAGNOSIS — Z79899 Other long term (current) drug therapy: Secondary | ICD-10-CM | POA: Insufficient documentation

## 2018-10-10 DIAGNOSIS — Z87891 Personal history of nicotine dependence: Secondary | ICD-10-CM | POA: Diagnosis not present

## 2018-10-10 DIAGNOSIS — R112 Nausea with vomiting, unspecified: Secondary | ICD-10-CM | POA: Insufficient documentation

## 2018-10-10 LAB — COMPREHENSIVE METABOLIC PANEL
ALT: 19 U/L (ref 0–44)
AST: 18 U/L (ref 15–41)
Albumin: 4.3 g/dL (ref 3.5–5.0)
Alkaline Phosphatase: 65 U/L (ref 38–126)
Anion gap: 8 (ref 5–15)
BUN: 14 mg/dL (ref 6–20)
CO2: 26 mmol/L (ref 22–32)
Calcium: 9.4 mg/dL (ref 8.9–10.3)
Chloride: 106 mmol/L (ref 98–111)
Creatinine, Ser: 0.88 mg/dL (ref 0.44–1.00)
GFR calc Af Amer: >60 mL/min (ref >60)
GFR calc non Af Amer: >60 mL/min (ref >60)
Glucose, Bld: 93 mg/dL (ref 70–99)
Potassium: 4.2 mmol/L (ref 3.5–5.1)
Sodium: 140 mmol/L (ref 135–145)
Total Bilirubin: 0.2 mg/dL — ABNORMAL LOW (ref 0.3–1.2)
Total Protein: 7.6 g/dL (ref 6.5–8.1)

## 2018-10-10 LAB — CBC
HCT: 43.3 % (ref 36.0–46.0)
Hemoglobin: 13.4 g/dL (ref 12.0–15.0)
MCH: 28.1 pg (ref 26.0–34.0)
MCHC: 30.9 g/dL (ref 30.0–36.0)
MCV: 90.8 fL (ref 80.0–100.0)
Platelets: 289 10*3/uL (ref 150–400)
RBC: 4.77 MIL/uL (ref 3.87–5.11)
RDW: 13.4 % (ref 11.5–15.5)
WBC: 6 10*3/uL (ref 4.0–10.5)
nRBC: 0 % (ref 0.0–0.2)

## 2018-10-10 LAB — LIPASE, BLOOD: Lipase: 41 U/L (ref 11–51)

## 2018-10-10 LAB — URINALYSIS, ROUTINE W REFLEX MICROSCOPIC
Bilirubin Urine: NEGATIVE
Glucose, UA: NEGATIVE mg/dL
Ketones, ur: NEGATIVE mg/dL
Nitrite: POSITIVE — AB
Protein, ur: 100 mg/dL — AB
Specific Gravity, Urine: 1.02 (ref 1.005–1.030)
pH: 7.5 (ref 5.0–8.0)

## 2018-10-10 LAB — URINALYSIS, MICROSCOPIC (REFLEX): RBC / HPF: >50 RBC/hpf (ref 0–5)

## 2018-10-10 LAB — PREGNANCY, URINE: Preg Test, Ur: NEGATIVE

## 2018-10-10 MED ORDER — IOHEXOL 300 MG/ML  SOLN
100.0000 mL | Freq: Once | INTRAMUSCULAR | Status: AC | PRN
  Administered 2018-10-10: 21:00:00 100 mL via INTRAVENOUS

## 2018-10-10 MED ORDER — ACETAMINOPHEN 325 MG PO TABS
650.0000 mg | ORAL_TABLET | Freq: Once | ORAL | Status: AC
  Administered 2018-10-10: 650 mg via ORAL
  Filled 2018-10-10: qty 2

## 2018-10-10 NOTE — Telephone Encounter (Signed)
Patient called is hurting upper part of back and under ribs.  She's nauseas when she it eats.  She thinks it's possibly her appendix or gallstones.  Please advise.  [place]

## 2018-10-10 NOTE — ED Triage Notes (Signed)
Pt c/o right sided abdominal pain that radiates to her back x 2 days. Nausea and vomiting started last night. Pt is 3 weeks postpartum and had a normal delivery.

## 2018-10-10 NOTE — Telephone Encounter (Signed)
Patient called stating that she is experiencing severe pain under her ribs nothing makes it better. I advised that she go to ER for evaluation.

## 2018-10-10 NOTE — Discharge Instructions (Signed)
Bland diet as discussed, you may also want to take over the counter Pepcid as directed for 2 weeks.  I have scheduled for you to return to have an ultrasound of your abdomen.  Call [phone] tomorrow to schedule a time to have the ultrasound done.  Return here for any worsening symptoms such as fever, increasing pain or persistent vomiting

## 2018-10-12 LAB — URINE CULTURE

## 2018-10-13 NOTE — ED Provider Notes (Signed)
[facility] EMERGENCY DEPARTMENT Provider Note   CSN: [id number] Arrival date & time: 10/10/18  1814    History   Chief Complaint Chief Complaint  Patient presents with   Abdominal Pain    HPI Patricia Richards is a 28 y.o. female.     HPI   Patricia Richards is a 28 y.o. female who is 3 weeks postpartum from a term delivery complicated by gestational DM.  She presents to the Emergency Department complaining of right abdominal pain for 2 days.  She describes a constant pain to her right upper abdomen that radiates to her mid back.  Her symptoms are also associated with nausea and vomiting that started on the evening prior to arrival.  She reports several episodes of vomiting.  She also complains of pain to her right lower abdomen that began several hours prior to arrival.  States that she is concerned that she may have appendicitis.  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,gets off street   Panic attacks    Pregnant    Urinary tract infection    gets them frequently   Vaginal Pap smear, abnormal     Patient Active Problem List   Diagnosis Date Noted   GDM, class A2 06/23/2018   Tachycardia 04/20/2018   High-risk pregnancy 03/02/2018   Short interval between pregnancies affecting pregnancy, antepartum 03/02/2018   History of drug use 66/10/3014   History of illicit drug use 05/25/3233   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   Abnormal Pap smear of cervix 05/20/2015   Depression with anxiety 05/13/2015   Hallucinations 05/13/2015   History of suicidal  ideation 05/13/2015   History of alcohol abuse 03/18/2015   Rh negative state in antepartum period 08/28/2013    Past Surgical History:  Procedure Laterality Date   DILATION AND EVACUATION  11/28/2010     OB History    Gravida  6   Para  5   Term  5   Preterm  0   AB  1   Living  5     SAB  0   TAB  1   Ectopic  0   Multiple  0   Live Births  5            Home Medications    Prior to Admission medications   Medication Sig Start Date End Date Taking? Authorizing Provider  acetaminophen (TYLENOL) 325 MG tablet Take 650 mg by mouth every 6 (six) hours as needed.    [provider]  acyclovir (ZOVIRAX) 400 MG tablet Take 1 tablet (400 mg total) by mouth 3 (three) times daily. 08/04/18   Roma Schanz, CNM  clonazePAM (KLONOPIN) 1 MG tablet 1 tablet three times daily prn anxiety 09/27/18   Florian Buff, MD  docusate sodium (COLACE) 100 MG capsule Take 1 capsule (100 mg total) by mouth 2 (two) times daily as needed. 07/21/18   Jonnie Kind, MD  etonogestrel-ethinyl estradiol ([place]) 0.12-0.015  MG/24HR vaginal ring Insert vaginally and leave in place for 3 consecutive weeks, then remove for 1 week. Start this when the baby is 83 weeks old. 09/21/18   Emily Filbert, MD  ferrous sulfate 325 (65 FE) MG EC tablet Take 1 tablet (325 mg total) by mouth 2 (two) times daily with a meal. 07/21/18   Jonnie Kind, MD  Prenatal Vit-Fe Fumarate-FA (PRENATAL MULTIVITAMIN) TABS tablet Take 1 tablet orally daily 09/21/18   Emily Filbert, MD    Family History Family History  Problem Relation Age of Onset   Hypertension Father    Cancer Father        skin   Mental illness Father    Cancer Maternal Grandmother    Mental illness Maternal Grandmother    Stroke Maternal Grandmother    Cerebral palsy Mother    Other Brother        problems with gums   Heart attack Paternal Grandmother    Down syndrome Cousin    Bipolar disorder Cousin     Social  History Social History   Tobacco Use   Smoking status: Former Smoker    Types: Cigarettes    Last attempt to quit: 02/14/2018    Years since quitting: 0.6   Smokeless tobacco: Never Used  Substance Use Topics   Alcohol use: Yes    Alcohol/week: 0.0 standard drinks    Comment: pt reports daily use prior to recent pregnancy, since pregnancy in May 2020 pt reports only drinking 3-4 times   Drug use: Not Currently    Types: Other-see comments, Marijuana, Cocaine, MDMA (Ecstacy)    Comment: none since April 2018     Allergies   Metronidazole; Tramadol; and Toradol [ketorolac tromethamine]   Review of Systems Review of Systems  Constitutional: Negative for appetite change, chills and fever.  Respiratory: Negative for shortness of breath.   Cardiovascular: Negative for chest pain.  Gastrointestinal: Positive for abdominal pain, nausea and vomiting. Negative for blood in stool and diarrhea.  Genitourinary: Negative for decreased urine volume, difficulty urinating, dysuria, flank pain and vaginal discharge.  Musculoskeletal: Negative for back pain.  Skin: Negative for color change and rash.  Neurological: Negative for dizziness, weakness and numbness.  Hematological: Negative for adenopathy.     Physical Exam Updated Vital Signs BP 116/76    Pulse 78    Temp (!) 97.5 F (36.4 C)    Resp 17    Ht 5\' 4"  (1.626 m)    Wt 61.7 kg    LMP 10/10/2018    SpO2 100%    Breastfeeding No    BMI 23.34 kg/m   Physical Exam Vitals signs and nursing note reviewed.  Constitutional:      General: She is not in acute distress.    Appearance: She is well-developed.  HENT:     Head: Normocephalic and atraumatic.  Cardiovascular:     Rate and Rhythm: Normal rate and regular rhythm.     Heart sounds: Normal heart sounds. No murmur.  Pulmonary:     Effort: Pulmonary effort is normal. No respiratory distress.     Breath sounds: Normal breath sounds.  Abdominal:     General: Bowel sounds are  normal. There is no distension.     Palpations: Abdomen is soft. There is no mass.     Tenderness: There is abdominal tenderness. There is no guarding or rebound.     Comments: Mild ttp of the RUQ.  No guarding or rebound.  Lower abd is soft and NT on exam.    Musculoskeletal: Normal range of motion.  Skin:    General: Skin is warm.  Neurological:     General: No focal deficit present.     Mental Status: She is alert.     Motor: No abnormal muscle tone.     Coordination: Coordination normal.      ED Treatments / Results  Labs (all labs ordered are listed, but only abnormal results are displayed) Labs Reviewed  URINE CULTURE - Abnormal; Notable for the following components:      Result Value   Culture MULTIPLE SPECIES PRESENT, SUGGEST RECOLLECTION (*)    All other components within normal limits  COMPREHENSIVE METABOLIC PANEL - Abnormal; Notable for the following components:   Total Bilirubin 0.2 (*)    All other components within normal limits  URINALYSIS, ROUTINE W REFLEX MICROSCOPIC - Abnormal; Notable for the following components:   Color, Urine RED (*)    APPearance HAZY (*)    Hgb urine dipstick LARGE (*)    Protein, ur 100 (*)    Nitrite POSITIVE (*)    Leukocytes,Ua SMALL (*)    All other components within normal limits  URINALYSIS, MICROSCOPIC (REFLEX) - Abnormal; Notable for the following components:   Bacteria, UA FEW (*)    All other components within normal limits  LIPASE, BLOOD  CBC  PREGNANCY, URINE    EKG None  Radiology Ct Abdomen Pelvis W Contrast  Result Date: 10/10/2018 CLINICAL DATA:  Abdominal pain with nausea and vomiting EXAM: CT ABDOMEN AND PELVIS WITH CONTRAST TECHNIQUE: Multidetector CT imaging of the abdomen and pelvis was performed using the standard protocol following bolus administration of intravenous contrast. CONTRAST:  130mL OMNIPAQUE IOHEXOL 300 MG/ML  SOLN COMPARISON:  CT abdomen and pelvis January 15 2016 FINDINGS: Lower chest: Lung  bases are clear. Hepatobiliary: No focal liver lesions are appreciable. The gallbladder wall is not appreciably thickened. There is no biliary duct dilatation. Pancreas: There is no pancreatic mass or inflammatory focus. Spleen: Spleen measures 13.0 x 10.2 x 6.0 cm with a measured splenic volume of 398 cubic cm. No focal splenic lesions are evident. Adrenals/Urinary Tract: Adrenals bilaterally appear normal. Kidneys bilaterally show no evident mass or hydronephrosis. There is no appreciable renal or ureteral calculus on either side. Urinary bladder is midline with wall thickness within normal limits. Stomach/Bowel: There is no appreciable bowel wall or mesenteric thickening. No bowel obstruction evident. Terminal ileum appears unremarkable. There is no free air or portal venous air. Vascular/Lymphatic: There is no abdominal aortic aneurysm. No vascular lesions are evident. There are scattered subcentimeter mesenteric lymph nodes, primarily in the right abdomen. The largest of these lymph nodes has a short axis diameter of 5 mm. There are subcentimeter inguinal lymph nodes as well. There is no adenopathy by size criteria in the abdomen or pelvis. Reproductive: The uterus is anteverted. There is vascular prominence in the periuterine region bilaterally. No pelvic mass is demonstrable. Other: Appendix is unremarkable. There is no abscess or ascites in the abdomen or pelvis. There is thinning of the rectus muscle in the midline without frank herniation. Musculoskeletal: No evident blastic or lytic bone lesions. No intramuscular lesions evident. IMPRESSION: 1. Prominent periuterine vascular structures. Question a degree of pelvic congestion syndrome. 2. Subcentimeter right mesenteric lymph nodes of questionable significance. In the appropriate clinical setting, there may be a degree of mesenteric adenitis. No adenopathy by size criteria evident in the abdomen or pelvis. 3. No  bowel obstruction. No appreciable bowel wall  thickening or mesenteric stranding. No abscess in the abdomen or pelvis. Appendix appears normal. 4. No renal or ureteral calculus. No hydronephrosis. Urinary bladder wall thickness within normal limits. Electronically Signed   By: Lowella Grip III M.D.   On: 10/10/2018 21:01     Procedures Procedures (including critical care time)  Medications Ordered in ED Medications  acetaminophen (TYLENOL) tablet 650 mg (650 mg Oral Given 10/10/18 1956)  iohexol (OMNIPAQUE) 300 MG/ML solution 100 mL (100 mLs Intravenous Contrast Given 10/10/18 2046)     Initial Impression / Assessment and Plan / ED Course  I have reviewed the triage vital signs and the nursing notes.  Pertinent labs & imaging results that were available during my care of the patient were reviewed by me and considered in my medical decision making (see chart for details).        Postpartum patient with right upper abdominal pain.  Unable to obtain ultrasound of abdomen after hours due to lack of technician availability.  CT scan of abdomen pelvis was ordered without evidence of appendicitis or gallbladder wall thickening.  Clinically, I feel there is a high probability of possible gallbladder disease and feel that she needs evaluation with ultrasound.  Schedule her for outpatient abdominal ultrasound.  She appears appropriate for discharge home and agrees to treatment plan.  She will continue Tylenol for pain control if needed.  Final Clinical Impressions(s) / ED Diagnoses   Final diagnoses:  Right upper quadrant abdominal pain    ED Discharge Orders         Ordered    US Abdomen Limited RUQ/Gall Gladder     10/10/18 2122           Kem Parkinson, PA-C 10/13/18 0019    Long, Wonda Old[facility], MD 10/13/18 [phone]

## 2018-10-14 IMAGING — CT CT ANGIO CHEST
2 of 6 series · 19 of 36 positions shown · IV contrast (Isovue)
Comparison: Chest radiographs dated 01/18/2017

CLINICAL DATA: 9 weeks pregnant, chest pain x2 weeks

EXAM:
CT ANGIOGRAPHY CHEST WITH CONTRAST
TECHNIQUE: Multidetector CT imaging of the chest was performed using the
standard protocol during bolus administration of intravenous
contrast. Multiplanar CT image reconstructions and MIPs were
obtained to evaluate the vascular anatomy.
CONTRAST:  75 mL Isovue 370

[Series 5: thins · axial · 0.59mm/px · z∈[+1316,+1555]mm · 18 of 267 slices shown]
[im 14/267  lung]
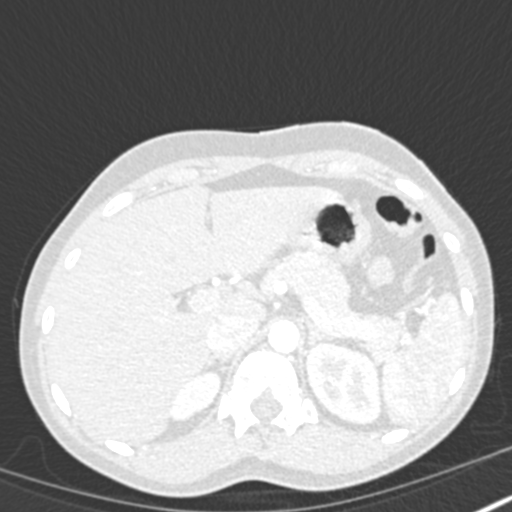
[im 27/267  mediastinal]
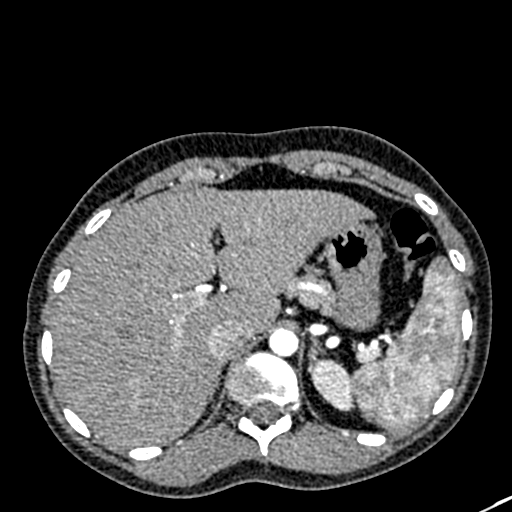
[im 40/267  lung]
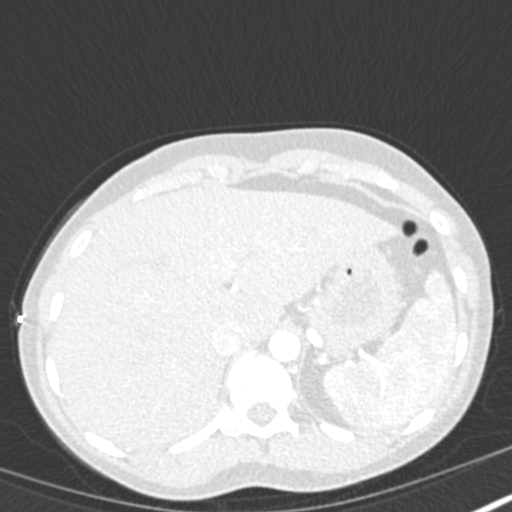
[im 54/267  mediastinal]
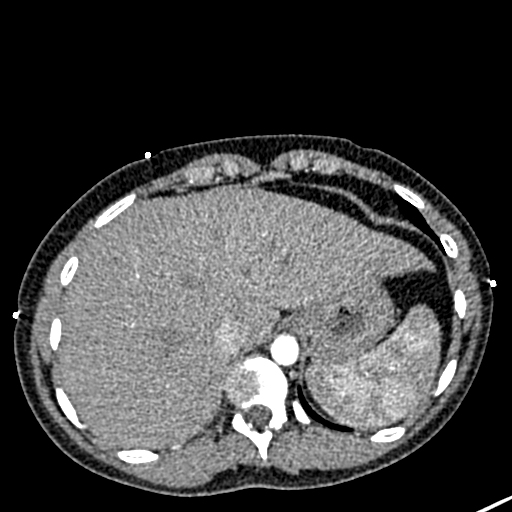
[im 67/267  lung]
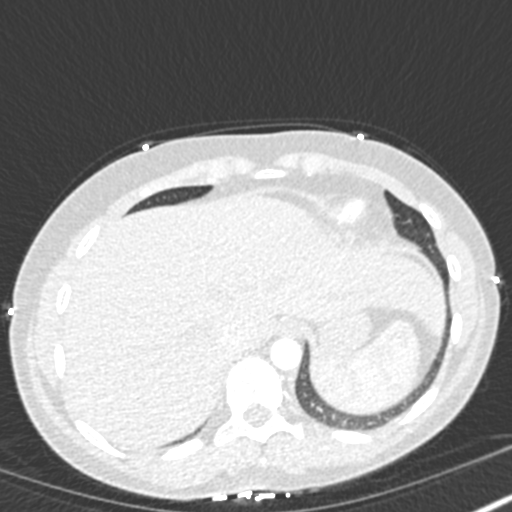
[im 80/267  mediastinal]
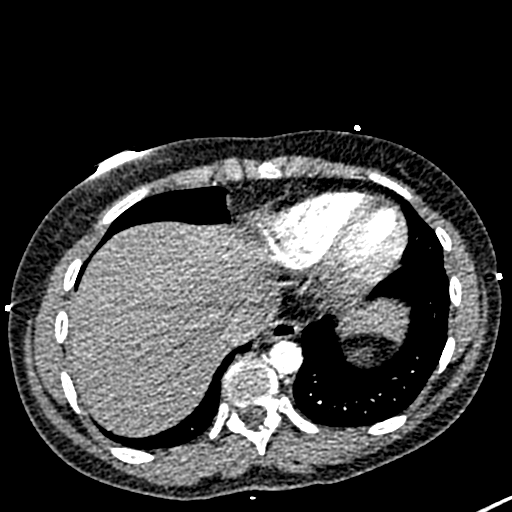
[im 94/267  lung]
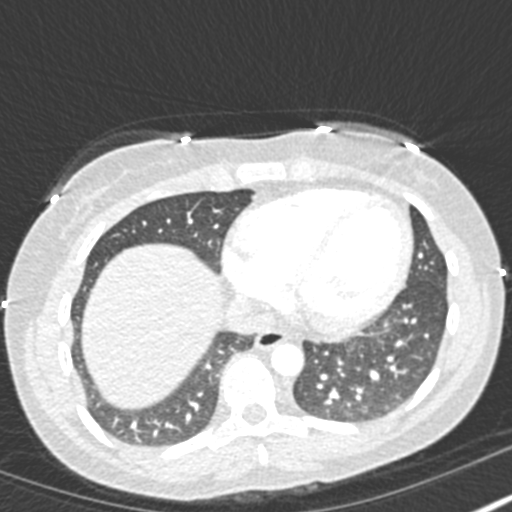
[im 107/267  mediastinal]
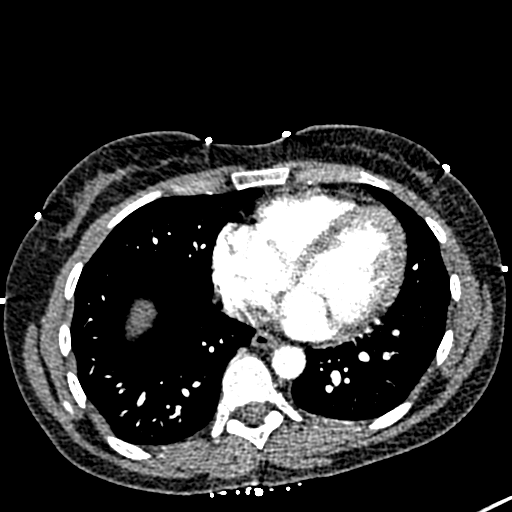
[im 120/267  lung]
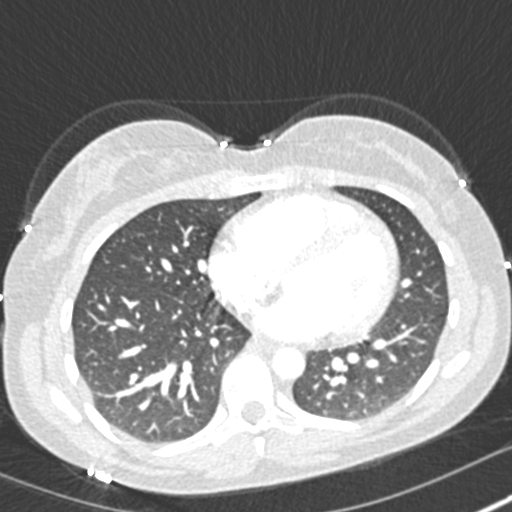
[im 147/267  mediastinal]
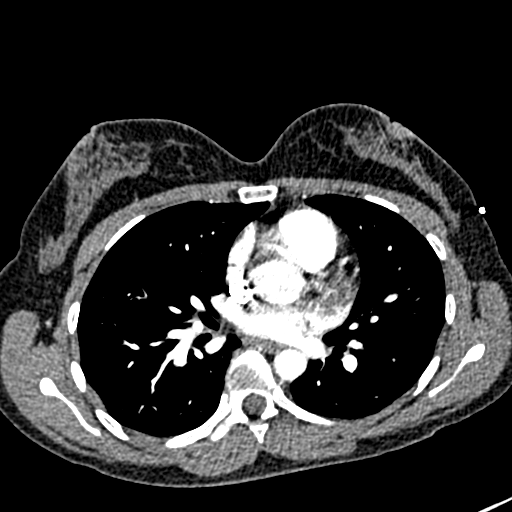
[im 160/267  lung]
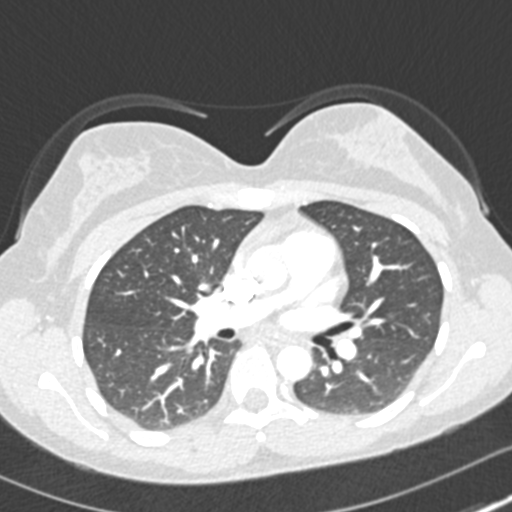
[im 173/267  mediastinal]
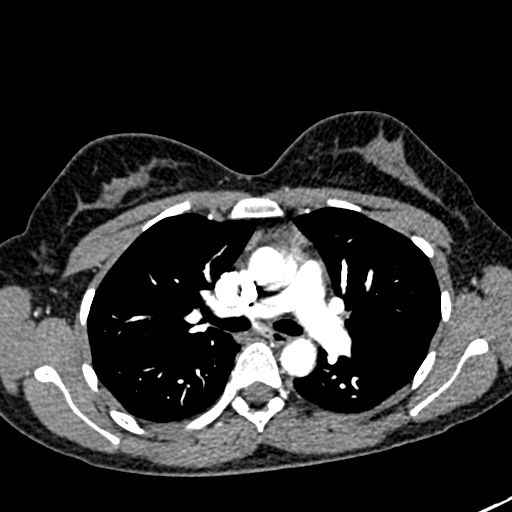
[im 187/267  lung]
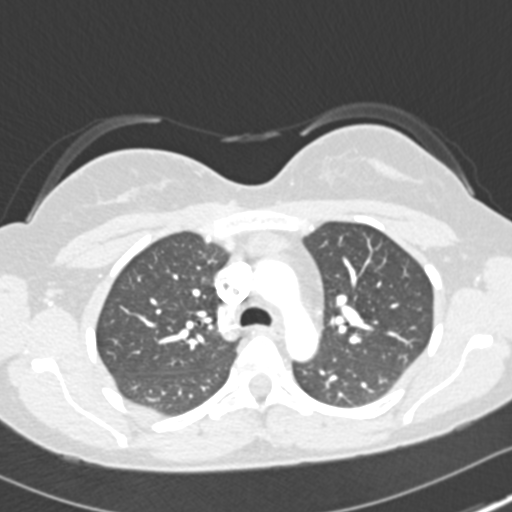
[im 200/267  mediastinal]
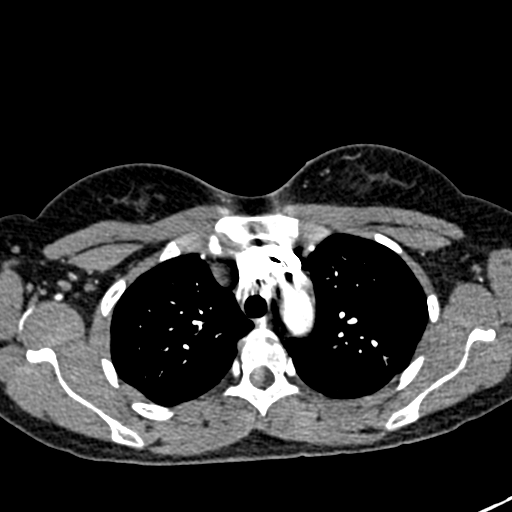
[im 213/267  lung]
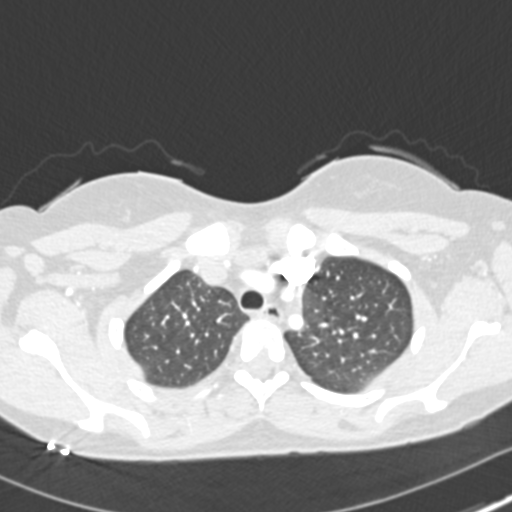
[im 227/267  mediastinal]
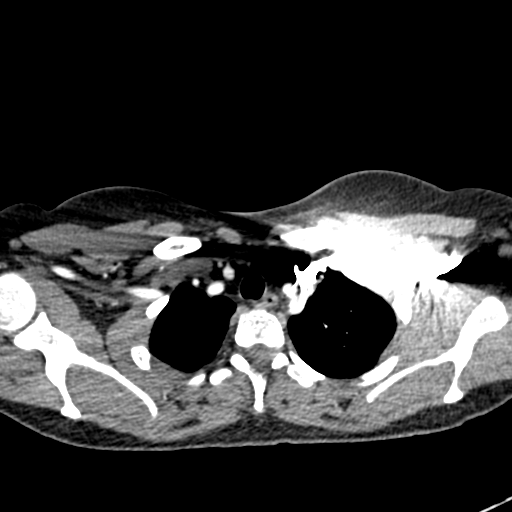
[im 240/267  lung]
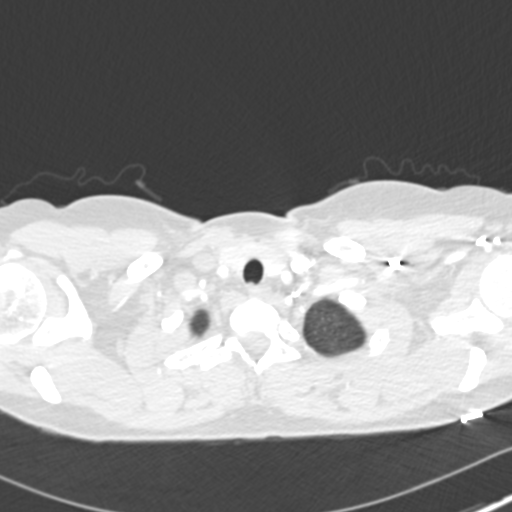
[im 253/267  mediastinal]
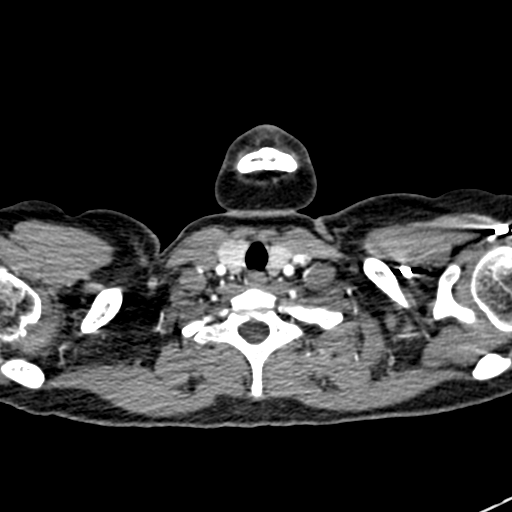

[Series 7: coronal mpr · coronal · 0.56mm/px · 1 of 109 slices shown]
[im 55/109  mediastinal]
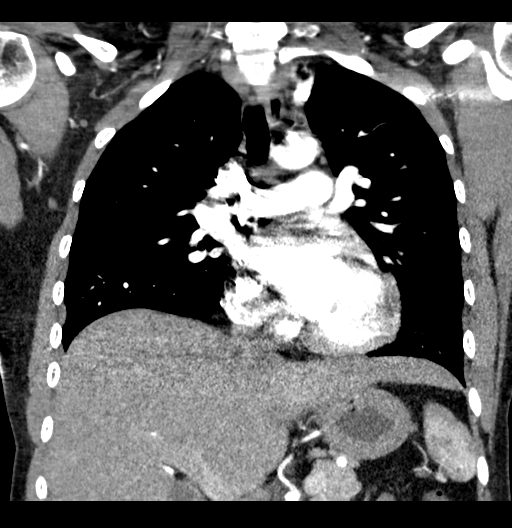

[19 of 36 positions shown; findings below may reference images not displayed]

FINDINGS: Cardiovascular: Satisfactory opacification of the bilateral
pulmonary artery to the segmental level. No evidence pulmonary
embolism.

No evidence of thoracic aortic aneurysm or dissection.

The heart is normal in size.  No pericardial effusion.

Mediastinum/Nodes: No suspicious mediastinal lymphadenopathy.

Visualized thyroid is unremarkable.

Lungs/Pleura: Lungs are clear.

No suspicious pulmonary nodules.

No focal consolidation.

No pleural effusion or pneumothorax.

Upper Abdomen: Visualized upper abdomen is unremarkable.

Musculoskeletal: Visualized osseous structures are within normal
limits.

Review of the MIP images confirms the above findings.
IMPRESSION: No evidence of pulmonary embolism.

Normal CT chest.

## 2018-10-16 ENCOUNTER — Telehealth: Payer: Self-pay | Admitting: Obstetrics & Gynecology

## 2018-10-16 NOTE — Telephone Encounter (Signed)
Patient called, stated that she went to [facility] and we can look at it to see what they said.  She thinks she has a bacteria infection, discharge w/a color, but no ordor.  Still in pain.  [phone]

## 2018-10-23 ENCOUNTER — Encounter: Payer: Self-pay | Admitting: *Deleted

## 2018-10-24 ENCOUNTER — Ambulatory Visit (INDEPENDENT_AMBULATORY_CARE_PROVIDER_SITE_OTHER): Payer: Medicaid Other | Admitting: Adult Health

## 2018-10-24 ENCOUNTER — Other Ambulatory Visit: Payer: Self-pay

## 2018-10-24 ENCOUNTER — Encounter: Payer: Self-pay | Admitting: Adult Health

## 2018-10-24 DIAGNOSIS — Z3202 Encounter for pregnancy test, result negative: Secondary | ICD-10-CM | POA: Diagnosis not present

## 2018-10-24 DIAGNOSIS — F419 Anxiety disorder, unspecified: Secondary | ICD-10-CM | POA: Insufficient documentation

## 2018-10-24 DIAGNOSIS — Z8632 Personal history of gestational diabetes: Secondary | ICD-10-CM | POA: Insufficient documentation

## 2018-10-24 DIAGNOSIS — Z1389 Encounter for screening for other disorder: Secondary | ICD-10-CM

## 2018-10-24 DIAGNOSIS — Z3044 Encounter for surveillance of vaginal ring hormonal contraceptive device: Secondary | ICD-10-CM | POA: Insufficient documentation

## 2018-10-24 DIAGNOSIS — F329 Major depressive disorder, single episode, unspecified: Secondary | ICD-10-CM | POA: Diagnosis not present

## 2018-10-24 DIAGNOSIS — F32A Depression, unspecified: Secondary | ICD-10-CM

## 2018-10-24 LAB — POCT URINE PREGNANCY: Preg Test, Ur: NEGATIVE

## 2018-10-24 MED ORDER — HYDROXYZINE HCL 10 MG PO TABS: 10.0000 mg | ORAL_TABLET | Freq: Three times a day (TID) | ORAL | 0 refills | Status: DC | PRN

## 2018-10-24 MED ORDER — ACYCLOVIR 400 MG PO TABS: 400.0000 mg | ORAL_TABLET | Freq: Three times a day (TID) | ORAL | 3 refills | Status: DC
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
## 2018-10-30 ENCOUNTER — Ambulatory Visit: Payer: Medicaid Other | Admitting: Obstetrics and Gynecology

## 2018-11-07 ENCOUNTER — Other Ambulatory Visit: Payer: Self-pay

## 2018-11-07 ENCOUNTER — Encounter: Payer: Self-pay | Admitting: Obstetrics and Gynecology

## 2018-11-07 ENCOUNTER — Ambulatory Visit (INDEPENDENT_AMBULATORY_CARE_PROVIDER_SITE_OTHER): Payer: Medicaid Other | Admitting: Obstetrics and Gynecology

## 2018-11-07 VITALS — BP 130/91 | HR 72 | Ht 64.0 in | Wt 140.8 lb

## 2018-11-07 DIAGNOSIS — D229 Melanocytic nevi, unspecified: Secondary | ICD-10-CM

## 2018-11-07 NOTE — Progress Notes (Signed)
Patient ID: Kamdyn Covel, female   DOB: 02-26-1991, 28 y.o.   MRN: [id number]    [facility] Visit  @DATE @            Patient name: Patricia Richards MRN [id number]  Date of birth: [birth year]  CC & HPI:  Patricia Richards is a 28 y.o. female presenting today for the removal of a mole next to her navel. It is not painful and does not bother the patient. She is nervous about getting it removed. Pt mentioned that her blood pressure is lower in her left arm than her right arm. Her heart rate was at 102 this morning but went down after she drank gatorade, which concerned her because she was tachycardic during her pregnancy. The patient denies fever, chills or any other symptoms or complaints at this time.     ROS:  ROS - fever - chills All systems are negative except as noted in the HPI and PMH.   Pertinent History Reviewed:   Reviewed:  Medical         Past Medical History:  Diagnosis Date  . 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 Anemia   . Bacterial infection   . Bipolar 1 disorder ([place])   . Depression    on meds now, doing well  . Gestational diabetes   . Herpes   . Infection    UTI  . Kidney stones   . 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 Panic attacks   . Pregnant   . Urinary tract infection    gets them frequently  . Vaginal Pap smear, abnormal                               Surgical Hx:    Past Surgical History:  Procedure Laterality Date  . DILATION AND EVACUATION  11/28/2010   Medications: Reviewed & Updated - see associated section                       Current Outpatient Medications:  .  acetaminophen (TYLENOL) 325 MG tablet, Take 650 mg by mouth every 6 (six) hours as needed., Disp: , Rfl:  .  clonazePAM (KLONOPIN) 1 MG tablet, 1 tablet three times daily prn anxiety, Disp: 90 tablet, Rfl: 0 .  FLUoxetine (PROZAC) 20 MG tablet, Take 20 mg by mouth  daily., Disp: , Rfl:  .  hydrOXYzine (ATARAX/VISTARIL) 10 MG tablet, Take 1 tablet (10 mg total) by mouth 3 (three) times daily as needed., Disp: 30 tablet, Rfl: 0   Social History: Reviewed -  reports that she quit smoking about 8 months ago. Her smoking use included cigarettes. She has never used smokeless tobacco.  Objective Findings:  Vitals: Blood pressure 124/86, pulse 74, height 5\' 4"  (1.626 m), weight 140 lb 12.8 oz (63.9 kg), last menstrual period 10/10/2018, not currently breastfeeding.  PHYSICAL EXAMINATION General appearance - alert, well appearing, and in no distress, oriented to person, place, and time and normal appearing weight Mental status - alert, oriented to person, place, and time, normal mood, behavior, speech, dress, motor activity, and thought processes, affect appropriate to mood  PELVIC DEFERRED  Assessment & Plan:   A:  1. Multiple moles on back and one next to navel -- none removed today  P:  1. Will monitor moles per pt request by uploading photos into chart  By signing my name below, I, De Burrs, attest that this documentation has been prepared under the direction and in the presence of Jonnie Kind, MD. Electronically Signed: De Burrs, Medical Scribe. 11/07/18. 4:34 PM.  I personally performed the services described in this documentation, which was SCRIBED in my presence. The recorded information has been reviewed and considered accurate. It has been edited as necessary during review. Jonnie Kind, MD

## 2018-11-14 ENCOUNTER — Telehealth (HOSPITAL_COMMUNITY): Payer: Self-pay

## 2018-11-24 ENCOUNTER — Other Ambulatory Visit: Payer: Self-pay

## 2018-11-24 ENCOUNTER — Emergency Department (HOSPITAL_COMMUNITY)
Admission: EM | Admit: 2018-11-24 | Discharge: 2018-11-24 | Disposition: A | Discharge status: Home / Self Care | Payer: Medicaid Other | Attending: Emergency Medicine | Admitting: Emergency Medicine

## 2018-11-24 ENCOUNTER — Emergency Department (HOSPITAL_COMMUNITY): Payer: Medicaid Other

## 2018-11-24 ENCOUNTER — Encounter (HOSPITAL_COMMUNITY): Payer: Self-pay | Admitting: Emergency Medicine

## 2018-11-24 DIAGNOSIS — Z79899 Other long term (current) drug therapy: Secondary | ICD-10-CM | POA: Diagnosis not present

## 2018-11-24 DIAGNOSIS — R1010 Upper abdominal pain, unspecified: Secondary | ICD-10-CM | POA: Diagnosis present

## 2018-11-24 DIAGNOSIS — Z87891 Personal history of nicotine dependence: Secondary | ICD-10-CM | POA: Insufficient documentation

## 2018-11-24 LAB — CBC WITH DIFFERENTIAL/PLATELET
Abs Immature Granulocytes: 0.01 10*3/uL (ref 0.00–0.07)
Basophils Absolute: 0 10*3/uL (ref 0.0–0.1)
Basophils Relative: 0 %
Eosinophils Absolute: 0.1 10*3/uL (ref 0.0–0.5)
Eosinophils Relative: 1 %
HCT: 34.3 % — ABNORMAL LOW (ref 36.0–46.0)
Hemoglobin: 11 g/dL — ABNORMAL LOW (ref 12.0–15.0)
Immature Granulocytes: 0 %
Lymphocytes Relative: 41 %
Lymphs Abs: 1.9 10*3/uL (ref 0.7–4.0)
MCH: 29 pg (ref 26.0–34.0)
MCHC: 32.1 g/dL (ref 30.0–36.0)
MCV: 90.5 fL (ref 80.0–100.0)
Monocytes Absolute: 0.3 10*3/uL (ref 0.1–1.0)
Monocytes Relative: 6 %
Neutro Abs: 2.5 10*3/uL (ref 1.7–7.7)
Neutrophils Relative %: 52 %
Platelets: 207 10*3/uL (ref 150–400)
RBC: 3.79 MIL/uL — ABNORMAL LOW (ref 3.87–5.11)
RDW: 13.7 % (ref 11.5–15.5)
WBC: 4.8 10*3/uL (ref 4.0–10.5)
nRBC: 0 % (ref 0.0–0.2)

## 2018-11-24 LAB — URINALYSIS, ROUTINE W REFLEX MICROSCOPIC
Bilirubin Urine: NEGATIVE
Glucose, UA: NEGATIVE mg/dL
Hgb urine dipstick: NEGATIVE
Ketones, ur: NEGATIVE mg/dL
Nitrite: NEGATIVE
Protein, ur: NEGATIVE mg/dL
Specific Gravity, Urine: 1.009 (ref 1.005–1.030)
pH: 6 (ref 5.0–8.0)

## 2018-11-24 LAB — LIPASE, BLOOD: Lipase: 32 U/L (ref 11–51)

## 2018-11-24 LAB — COMPREHENSIVE METABOLIC PANEL
ALT: 26 U/L (ref 0–44)
AST: 20 U/L (ref 15–41)
Albumin: 4.2 g/dL (ref 3.5–5.0)
Alkaline Phosphatase: 43 U/L (ref 38–126)
Anion gap: 6 (ref 5–15)
BUN: 15 mg/dL (ref 6–20)
CO2: 24 mmol/L (ref 22–32)
Calcium: 8.9 mg/dL (ref 8.9–10.3)
Chloride: 108 mmol/L (ref 98–111)
Creatinine, Ser: 0.58 mg/dL (ref 0.44–1.00)
GFR calc Af Amer: >60 mL/min (ref >60)
GFR calc non Af Amer: >60 mL/min (ref >60)
Glucose, Bld: 97 mg/dL (ref 70–99)
Potassium: 3.8 mmol/L (ref 3.5–5.1)
Sodium: 138 mmol/L (ref 135–145)
Total Bilirubin: 0.4 mg/dL (ref 0.3–1.2)
Total Protein: 7 g/dL (ref 6.5–8.1)

## 2018-11-24 LAB — TROPONIN I (HIGH SENSITIVITY): Troponin I (High Sensitivity): <2 ng/L (ref <18)

## 2018-11-24 LAB — PREGNANCY, URINE: Preg Test, Ur: NEGATIVE

## 2018-11-24 MED ORDER — FAMOTIDINE IN NACL 20-0.9 MG/50ML-% IV SOLN
20.0000 mg | Freq: Once | INTRAVENOUS | Status: DC
  Filled 2018-11-24: qty 50

## 2018-11-24 MED ORDER — PANTOPRAZOLE SODIUM 20 MG PO TBEC: 20.0000 mg | DELAYED_RELEASE_TABLET | Freq: Every day | ORAL | 0 refills | Status: DC

## 2018-11-24 MED ORDER — LIDOCAINE VISCOUS HCL 2 % MT SOLN
15.0000 mL | Freq: Once | OROMUCOSAL | Status: AC
  Administered 2018-11-24: 15 mL via ORAL
  Filled 2018-11-24: qty 15

## 2018-11-24 MED ORDER — ALUM & MAG HYDROXIDE-SIMETH 200-200-20 MG/5ML PO SUSP
30.0000 mL | Freq: Once | ORAL | Status: AC
  Administered 2018-11-24: 30 mL via ORAL
  Filled 2018-11-24: qty 30

## 2018-11-24 NOTE — ED Provider Notes (Signed)
[facility] EMERGENCY DEPARTMENT Provider Note   CSN: [id number] Arrival date & time: 11/24/18  1210     History   Chief Complaint Chief Complaint  Patient presents with   Chest Pain   Abdominal Pain    HPI Patricia Richards is a 28 y.o. female.     HPI  Pt was seen at 1225.  Per pt, c/o gradual onset and persistence of constant acute flair of her chronic RUQ abd "pain" for the past 1 week. Has been associated with left upper chest wall "pains" for the past 2 days.  CP worsens with palpation of the area, has been constant since onset. Describes the abd pain as "tightness," with radiation into her back.  Pt states she did have one episode of N/V "after I drank some alcohol" several days ago. Pt states she looked her symptoms up on the internet and believes she has "internal bleeding." Pt went to [facility] ED x2, but LWBS "because the wait was too long." Denies any further N/V, no diarrhea, no fevers, no back pain, no rash, no cough/SOB, no black or blood in stools or emesis, no palpitations, no injury.    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,gets off street   Panic attacks    Pregnant    Urinary tract infection    gets them frequently   Vaginal Pap smear, abnormal     Patient Active Problem List   Diagnosis Date Noted   Encounter for postpartum visit 10/24/2018   Pregnancy test negative 10/24/2018   Anxiety and depression 10/24/2018   Encounter for surveillance of vaginal ring hormonal contraceptive device 10/24/2018   History of gestational diabetes 10/24/2018   GDM, class A2 06/23/2018   Tachycardia 04/20/2018   History of drug  use 11/14/1599   History of illicit drug use 09/32/3557   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   Abnormal Pap smear of cervix 05/20/2015   Depression with anxiety 05/13/2015   Hallucinations 05/13/2015   History of suicidal ideation 05/13/2015   History of alcohol abuse 03/18/2015   Rh negative state in antepartum period 08/28/2013    Past Surgical History:  Procedure Laterality Date   DILATION AND EVACUATION  11/28/2010     OB History    Gravida  6   Para  5   Term  5   Preterm  0   AB  1   Living  5     SAB  0   TAB  1   Ectopic  0   Multiple  0   Live Births  5            Home Medications    Prior to Admission medications   Medication Sig Start Date End Date Taking? Authorizing Provider  acetaminophen (TYLENOL) 325 MG tablet Take 650 mg by mouth every 6 (six) hours as needed.    [provider]  clonazePAM Bobbye [place]) 1 MG tablet 1 tablet three times daily prn anxiety 09/27/18   Florian Buff, MD  FLUoxetine (PROZAC) 20 MG tablet  Take 20 mg by mouth daily.    [provider]  hydrOXYzine (ATARAX/VISTARIL) 10 MG tablet Take 1 tablet (10 mg total) by mouth 3 (three) times daily as needed. 10/24/18   Estill Dooms, NP    Family History Family History  Problem Relation Age of Onset   Hypertension Father    Cancer Father        skin   Mental illness Father    Diabetes Father    Cancer Maternal Grandmother    Mental illness Maternal Grandmother    Stroke Maternal Grandmother    Cerebral palsy Mother    Other Brother        problems with gums   Heart attack Paternal Grandmother    Down syndrome Cousin    Bipolar disorder Cousin     Social History Social History   Tobacco Use   Smoking status: Former Smoker    Types: Cigarettes    Quit date: 02/14/2018    Years since quitting: 0.7   Smokeless tobacco: Never Used  Substance Use Topics   Alcohol use: Yes    Alcohol/week: 0.0  standard drinks    Comment: socially.    Drug use: Not Currently    Types: Other-see comments, Marijuana, Cocaine, MDMA (Ecstacy)    Comment: none since April 2018     Allergies   Metronidazole, Tramadol, and Toradol [ketorolac tromethamine]   Review of Systems Review of Systems ROS: Statement: All systems negative except as marked or noted in the HPI; Constitutional: Negative for fever and chills. ; ; Eyes: Negative for eye pain, redness and discharge. ; ; ENMT: Negative for ear pain, hoarseness, nasal congestion, sinus pressure and sore throat. ; ; Cardiovascular: Negative for palpitations, diaphoresis, dyspnea and peripheral edema. ; ; Respiratory: Negative for cough, wheezing and stridor. ; ; Gastrointestinal: +N/V, abd pain. Negative for diarrhea, blood in stool, hematemesis, jaundice and rectal bleeding. . ; ; Genitourinary: Negative for dysuria, flank pain and hematuria. ; ; Musculoskeletal: +chest wall pain. Negative for back pain and neck pain. Negative for swelling and trauma.; ; Skin: Negative for pruritus, rash, abrasions, blisters, bruising and skin lesion.; ; Neuro: Negative for headache, lightheadedness and neck stiffness. Negative for weakness, altered level of consciousness, altered mental status, extremity weakness, paresthesias, involuntary movement, seizure and syncope.       Physical Exam Updated Vital Signs BP 113/87 (BP Location: Left Arm)    Pulse 77    Temp 97.6 F (36.4 C) (Oral)    Resp 16    Ht 5\' 4"  (1.626 m)    Wt 62.6 kg    LMP 11/08/2018 (Approximate)    SpO2 98%    Breastfeeding No    BMI 23.69 kg/m     12:47 Orthostatic Vital Signs JD  Orthostatic Lying   BP- Lying: 112/85  Pulse- Lying: 72      Orthostatic Sitting  BP- Sitting: 120/90  Pulse- Sitting: 72     Physical Exam 1230: Physical examination:  Nursing notes reviewed; Vital signs and O2 SAT reviewed;  Constitutional: Well developed, Well nourished, Well hydrated, In no acute distress;  Head:  Normocephalic, atraumatic; Eyes: EOMI, PERRL, No scleral icterus; ENMT: Mouth and pharynx normal, Mucous membranes moist; Neck: Supple, Full range of motion, No lymphadenopathy; Cardiovascular: Regular rate and rhythm, No gallop; Respiratory: Breath sounds clear & equal bilaterally, No wheezes.  Speaking full sentences with ease, Normal respiratory effort/excursion; Chest: +left upper chest wall tender to palp. No rash, no deformity.  Movement normal; Abdomen: Soft, +RUQ and mid-epigastric tenderness to palp. No rebound or guarding. Nondistended, Normal bowel sounds; Genitourinary: No CVA tenderness; Extremities: Peripheral pulses normal, No tenderness, No edema, No calf edema or asymmetry.; Neuro: AA&Ox3, Major CN grossly intact.  Speech clear. No gross focal motor or sensory deficits in extremities.; Skin: Color normal, Warm, Dry.   ED Treatments / Results  Labs (all labs ordered are listed, but only abnormal results are displayed)   EKG EKG Interpretation  Date/Time:  Friday November 24 2018 12:24:16 EDT Ventricular Rate:  74 PR Interval:    QRS Duration: 87 QT Interval:  405 QTC Calculation: 450 R Axis:   85 Text Interpretation:  Sinus rhythm RSR' in V1 or V2, probably normal variant When compared with ECG of 08/26/2017 No significant change was found Confirmed by Francine Graven [phone]) on 11/24/2018 12:32:30 PM   Radiology   Procedures Procedures (including critical care time)  Medications Ordered in ED Medications  famotidine (PEPCID) IVPB 20 mg premix (has no administration in time range)     Initial Impression / Assessment and Plan / ED Course  I have reviewed the triage vital signs and the nursing notes.  Pertinent labs & imaging results that were available during my care of the patient were reviewed by me and considered in my medical decision making (see chart for details).     MDM Reviewed: previous chart, nursing note and vitals Reviewed previous: ECG, labs  and CT scan Interpretation: labs, ECG, x-ray and ultrasound   Results for orders placed or performed during the hospital encounter of 11/24/18  Comprehensive metabolic panel  Result Value Ref Range   Sodium 138 135 - 145 mmol/L   Potassium 3.8 3.5 - 5.1 mmol/L   Chloride 108 98 - 111 mmol/L   CO2 24 22 - 32 mmol/L   Glucose, Bld 97 70 - 99 mg/dL   BUN 15 6 - 20 mg/dL   Creatinine, Ser 0.58 0.44 - 1.00 mg/dL   Calcium 8.9 8.9 - 10.3 mg/dL   Total Protein 7.0 6.5 - 8.1 g/dL   Albumin 4.2 3.5 - 5.0 g/dL   AST 20 15 - 41 U/L   ALT 26 0 - 44 U/L   Alkaline Phosphatase 43 38 - 126 U/L   Total Bilirubin 0.4 0.3 - 1.2 mg/dL   GFR calc non Af Amer >60 >60 mL/min   GFR calc Af Amer >60 >60 mL/min   Anion gap 6 5 - 15  Lipase, blood  Result Value Ref Range   Lipase 32 11 - 51 U/L  CBC with Differential  Result Value Ref Range   WBC 4.8 4.0 - 10.5 K/uL   RBC 3.79 (L) 3.87 - 5.11 MIL/uL   Hemoglobin 11.0 (L) 12.0 - 15.0 g/dL   HCT 34.3 (L) 36.0 - 46.0 %   MCV 90.5 80.0 - 100.0 fL   MCH 29.0 26.0 - 34.0 pg   MCHC 32.1 30.0 - 36.0 g/dL   RDW 13.7 11.5 - 15.5 %   Platelets 207 150 - 400 K/uL   nRBC 0.0 0.0 - 0.2 %   Neutrophils Relative % 52 %   Neutro Abs 2.5 1.7 - 7.7 K/uL   Lymphocytes Relative 41 %   Lymphs Abs 1.9 0.7 - 4.0 K/uL   Monocytes Relative 6 %   Monocytes Absolute 0.3 0.1 - 1.0 K/uL   Eosinophils Relative 1 %   Eosinophils Absolute 0.1 0.0 - 0.5 K/uL   Basophils Relative 0 %  Basophils Absolute 0.0 0.0 - 0.1 K/uL   Immature Granulocytes 0 %   Abs Immature Granulocytes 0.01 0.00 - 0.07 K/uL  Pregnancy, urine  Result Value Ref Range   Preg Test, Ur NEGATIVE NEGATIVE  Urinalysis, Routine w reflex microscopic  Result Value Ref Range   Color, Urine STRAW (A) YELLOW   APPearance CLEAR CLEAR   Specific Gravity, Urine 1.009 1.005 - 1.030   pH 6.0 5.0 - 8.0   Glucose, UA NEGATIVE NEGATIVE mg/dL   Hgb urine dipstick NEGATIVE NEGATIVE   Bilirubin Urine NEGATIVE  NEGATIVE   Ketones, ur NEGATIVE NEGATIVE mg/dL   Protein, ur NEGATIVE NEGATIVE mg/dL   Nitrite NEGATIVE NEGATIVE   Leukocytes,Ua TRACE (A) NEGATIVE   RBC / HPF 0-5 0 - 5 RBC/hpf   WBC, UA 0-5 0 - 5 WBC/hpf   Bacteria, UA RARE (A) NONE SEEN   Squamous Epithelial / LPF 11-20 0 - 5  Troponin I (High Sensitivity)  Result Value Ref Range   Troponin I (High Sensitivity) <2.0 <18 ng/L   US Abdomen Complete Result Date: 11/24/2018 CLINICAL DATA:  Abdominal pain EXAM: ABDOMEN ULTRASOUND COMPLETE COMPARISON:  CT abdomen and pelvis Oct 10, 2018 FINDINGS: Gallbladder: No gallstones or wall thickening visualized. There is no pericholecystic fluid. No sonographic Murphy sign noted by sonographer. Common bile duct: Diameter: 2 mm. No intrahepatic, common hepatic, or common bile duct dilatation. Liver: No focal lesion identified. Within normal limits in parenchymal echogenicity. Portal vein is patent on color Doppler imaging with normal direction of blood flow towards the liver. IVC: No abnormality visualized. Pancreas: No pancreatic mass or inflammatory focus. Spleen: Size and appearance within normal limits. Right Kidney: Length: 11.4 cm. Echogenicity within normal limits. No mass or hydronephrosis visualized. Left Kidney: Length: 11.9 cm. Echogenicity within normal limits. No mass or hydronephrosis visualized. Abdominal aorta: No aneurysm visualized. Other findings: No demonstrable ascites. IMPRESSION: Study within normal limits. Electronically Signed   By: Lowella Grip III M.D.   On: 11/24/2018 13:19   Dg Abd Acute W/chest Result Date: 11/24/2018 CLINICAL DATA:  Chest and epigastric pain radiating into the back for 1 week. EXAM: DG ABDOMEN ACUTE W/ 1V CHEST COMPARISON:  CT abdomen and pelvis 10/10/2018. FINDINGS: Single-view of the abdomen demonstrates clear lungs and normal heart size. No pneumothorax or pleural fluid. Two views of the abdomen show no free intraperitoneal air. The bowel gas pattern is  nonobstructive. Moderate stool burden noted. No abnormal abdominal calcification. No acute bony abnormality. IMPRESSION: No acute finding chest or abdomen. Electronically Signed   By: Inge Rise M.D.   On: 11/24/2018 14:00    Patricia Richards was evaluated in Emergency Department on 11/24/2018 for the symptoms described in the history of present illness. She was evaluated in the context of the global COVID-19 pandemic, which necessitated consideration that the patient might be at risk for infection with the SARS-CoV-2 virus that causes COVID-19. Institutional protocols and algorithms that pertain to the evaluation of patients at risk for COVID-19 are in a state of rapid change based on information released by regulatory bodies including the CDC and federal and state organizations. These policies and algorithms were followed during the patient's care in the ED.    1425:  Workup reassuring. Pt has tol PO well while in the ED without N/V.  No stooling while in the ED.  Abd benign, resps easy, VSS. Pt upset that "you didn't find out exactly what it is." ED RN and I extensively explained  ED role in continuum of healthcare, reassured pt she did not need admission to the hospital nor need General Surgery consultation at this time, and explained that she can f/u with PMD and GI MD as outpatient for further diagnostic testing.. Feels better after meds and wants to go home now. Dx and testing, d/w pt.  Questions answered.  Verb understanding, agreeable to d/c home with outpt f/u.       Final Clinical Impressions(s) / ED Diagnoses   Final diagnoses:  None    ED Discharge Orders    None       Francine Graven, DO 11/28/18 1605

## 2018-11-24 NOTE — Discharge Instructions (Signed)
Eat a bland diet, avoiding greasy, fatty, fried foods, as well as spicy and acidic foods or beverages.  Avoid eating within 2 to 3 hours before going to bed or laying down.  Also avoid teas, colas, coffee, chocolate, pepermint and spearment.  Continue to take your pepcid, as previously directed. May also take over the counter maalox/mylanta, as directed on packaging, as needed for discomfort.  Take the new prescription as directed.  Call your regular medical doctor today to schedule a follow up appointment next week. Call the GI doctor on Monday to schedule a follow up appointment within the next week. Return to the Emergency Department immediately if worsening.

## 2018-11-24 NOTE — ED Triage Notes (Signed)
Pt c/o chest pain and epigastric pain that radiates to her back x 1 week. Pt states she attempted to be seen in [place] twice, but their wait was to long, so she left. Pt also report lightheadedness. Denies n/v/d.

## 2018-11-30 ENCOUNTER — Ambulatory Visit: Payer: Medicaid Other | Admitting: Student

## 2018-11-30 NOTE — Progress Notes (Deleted)
Cardiology Office Note    Date:  11/30/2018   ID:  [name], DOB 1990/09/28, MRN [id number]  PCP:  [facility] [facility]  Cardiologist: Dorris Carnes, MD    No chief complaint on file.   History of Present Illness:    Patricia Richards is a 28 y.o. female with past medical history of palpitations, bipolar disorder, panic attacks, and prior substance abuse who presents to the office today for evaluation of worsening palpitations.  She was last examined by Dr. Harrington Challenger in 04/2018 and was [redacted] weeks pregnant at that time.  She reported intermittent palpitations which have been worse during her pregnancy. A Holter monitor had recently been placed and showed normal sinus rhythm with rare PACs and PVCs.  It was thought that she likely had mild orthostatic intolerance exacerbated by pregnancy and no changes were made to her medication regimen at that time. Increased fluid intake was encouraged.    Past Medical History:  Diagnosis Date  . 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 Anemia   . Bacterial infection   . Bipolar 1 disorder ([place])   . Depression    on meds now, doing well  . Gestational diabetes   . Herpes   . Infection    UTI  . Kidney stones   . 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 Panic attacks   . Pregnant   . Urinary tract infection    gets them frequently  . Vaginal Pap smear, abnormal     Past Surgical History:  Procedure Laterality Date  . DILATION AND EVACUATION  11/28/2010    Current Medications: Outpatient Medications Prior to Visit  Medication Sig Dispense Refill  . acetaminophen (TYLENOL) 325 MG tablet Take 650 mg by mouth every 6 (six) hours as needed.    . clonazePAM (KLONOPIN) 1 MG tablet 1 tablet three times daily prn anxiety 90 tablet 0  . famotidine (PEPCID) 20 MG tablet Take 20 mg by mouth 2 (two) times daily.    . hydrOXYzine  (ATARAX/VISTARIL) 10 MG tablet Take 1 tablet (10 mg total) by mouth 3 (three) times daily as needed. (Patient not taking: Reported on 11/24/2018) 30 tablet 0  . pantoprazole (PROTONIX) 20 MG tablet Take 1 tablet (20 mg total) by mouth daily. 20 tablet 0   No facility-administered medications prior to visit.      Allergies:   Metronidazole, Tramadol, and Toradol [ketorolac tromethamine]   Social History   Socioeconomic History  . Marital status: Married    Spouse name: Louie Casa  . Number of children: 4  . Years of education: Not on file  . Highest education level: Not on file  Occupational History  . Not on file  Social Needs  . Financial resource strain: Not hard at all  . Food insecurity    Worry: Never true    Inability: Never true  . Transportation needs    Medical: Yes    Non-medical: Yes  Tobacco Use  . Smoking status: Former Smoker    Types: Cigarettes    Quit date: 02/14/2018    Years since quitting: 0.7  . Smokeless tobacco: Never Used  Substance and Sexual Activity  . Alcohol use: Yes    Alcohol/week: 0.0 standard drinks    Comment: socially.   . Drug use: Not Currently    Types: Other-see comments, Marijuana, Cocaine, MDMA (Ecstacy)  Comment: none since April 2018  . Sexual activity: Yes    Birth control/protection: None  Lifestyle  . Physical activity    Days per week: Patient refused    Minutes per session: Patient refused  . Stress: To some extent  Relationships  . Social Herbalist on phone: Three times a week    Gets together: Never    Attends religious service: Never    Active member of club or organization: Yes    Attends meetings of clubs or organizations: Never    Relationship status: Married  Other Topics Concern  . Not on file  Social History Narrative  . Not on file     Family History:  The patient's ***family history includes Bipolar disorder in her cousin; Cancer in her father and maternal grandmother; Cerebral palsy in her  mother; Diabetes in her father; Down syndrome in her cousin; Heart attack in her paternal grandmother; Hypertension in her father; Mental illness in her father and maternal grandmother; Other in her brother; Stroke in her maternal grandmother.   Review of Systems:   Please see the history of present illness.     General:  No chills, fever, night sweats or weight changes.  Cardiovascular:  No chest pain, dyspnea on exertion, edema, orthopnea, palpitations, paroxysmal nocturnal dyspnea. Dermatological: No rash, lesions/masses Respiratory: No cough, dyspnea Urologic: No hematuria, dysuria Abdominal:   No nausea, vomiting, diarrhea, bright red blood per rectum, melena, or hematemesis Neurologic:  No visual changes, wkns, changes in mental status. All other systems reviewed and are otherwise negative except as noted above.   Physical Exam:    VS:  LMP 11/08/2018 (Approximate)    General: Well developed, well nourished,female appearing in no acute distress. Head: Normocephalic, atraumatic, sclera non-icteric, no xanthomas, nares are without discharge.  Neck: No carotid bruits. JVD not elevated.  Lungs: Respirations regular and unlabored, without wheezes or rales.  Heart: ***Regular rate and rhythm. No S3 or S4.  No murmur, no rubs, or gallops appreciated. Abdomen: Soft, non-tender, non-distended with normoactive bowel sounds. No hepatomegaly. No rebound/guarding. No obvious abdominal masses. Msk:  Strength and tone appear normal for age. No joint deformities or effusions. Extremities: No clubbing or cyanosis. No edema.  Distal pedal pulses are 2+ bilaterally. Neuro: Alert and oriented X 3. Moves all extremities spontaneously. No focal deficits noted. Psych:  Responds to questions appropriately with a normal affect. Skin: No rashes or lesions noted  Wt Readings from Last 3 Encounters:  11/24/18 138 lb (62.6 kg)  11/07/18 140 lb 12.8 oz (63.9 kg)  10/24/18 140 lb 3.2 oz (63.6 kg)         Studies/Labs Reviewed:   EKG:  EKG is*** ordered today.  The ekg ordered today demonstrates ***  Recent Labs: 11/24/2018: ALT 26; BUN 15; Creatinine, Ser 0.58; Hemoglobin 11.0; Platelets 207; Potassium 3.8; Sodium 138   Lipid Panel No results found for: CHOL, TRIG, HDL, CHOLHDL, VLDL, LDLCALC, LDLDIRECT  Additional studies/ records that were reviewed today include:   Holter Monitor: 04/2018 48-hour Holter monitor reviewed.  Sinus rhythm and sinus tachycardia noted.  Heart rate ranged from 71 bpm up to 150 bpm with average heart rate 97 bpm.  Rare PVC and nonconducted PAC noted, no sustained arrhythmias or pauses.  Assessment:    No diagnosis found.   Plan:   In order of problems listed above:  1. ***    Medication Adjustments/Labs and Tests Ordered: Current medicines are reviewed at length with  the patient today.  Concerns regarding medicines are outlined above.  Medication changes, Labs and Tests ordered today are listed in the Patient Instructions below. There are no Patient Instructions on file for this visit.   Signed, Erma Heritage, PA-C  11/30/2018 11:16 AM    [facility] [address] [place], [place] [address] Phone: [phone] Fax: [phone]

## 2018-12-11 ENCOUNTER — Encounter: Payer: Medicaid Other | Admitting: Adult Health

## 2018-12-12 ENCOUNTER — Ambulatory Visit (INDEPENDENT_AMBULATORY_CARE_PROVIDER_SITE_OTHER): Payer: Medicaid Other | Admitting: Adult Health

## 2018-12-12 ENCOUNTER — Other Ambulatory Visit: Payer: Self-pay

## 2018-12-12 ENCOUNTER — Encounter: Payer: Self-pay | Admitting: Adult Health

## 2018-12-12 VITALS — Ht 63.0 in

## 2018-12-12 DIAGNOSIS — O3680X0 Pregnancy with inconclusive fetal viability, not applicable or unspecified: Secondary | ICD-10-CM | POA: Diagnosis not present

## 2018-12-12 DIAGNOSIS — F419 Anxiety disorder, unspecified: Secondary | ICD-10-CM | POA: Diagnosis not present

## 2018-12-12 DIAGNOSIS — O09899 Supervision of other high risk pregnancies, unspecified trimester: Secondary | ICD-10-CM | POA: Insufficient documentation

## 2018-12-12 DIAGNOSIS — Z8632 Personal history of gestational diabetes: Secondary | ICD-10-CM | POA: Diagnosis not present

## 2018-12-12 DIAGNOSIS — F329 Major depressive disorder, single episode, unspecified: Secondary | ICD-10-CM

## 2018-12-12 DIAGNOSIS — Z3490 Encounter for supervision of normal pregnancy, unspecified, unspecified trimester: Secondary | ICD-10-CM | POA: Insufficient documentation

## 2018-12-12 DIAGNOSIS — F32A Depression, unspecified: Secondary | ICD-10-CM

## 2018-12-12 MED ORDER — ESCITALOPRAM OXALATE 10 MG PO TABS: 10.0000 mg | ORAL_TABLET | Freq: Every day | ORAL | 12 refills | Status: DC

## 2018-12-12 MED ORDER — PRENATAL PLUS 27-1 MG PO TABS: 1.0000 | ORAL_TABLET | Freq: Every day | ORAL | 12 refills | Status: DC

## 2018-12-12 NOTE — Progress Notes (Signed)
Patient ID: Patricia Richards, female   DOB: 29-Oct-1990, 28 y.o.   MRN: [id number]   TELEHEALTH VIRTUAL GYNECOLOGY VISIT ENCOUNTER NOTE  I connected with Judd Lien on 12/12/18 at 12:00 PM EDT by telephone at home and verified that I am speaking with the correct person using two identifiers.   I discussed the limitations, risks, security and privacy concerns of performing an evaluation and management service by telephone and the availability of in person appointments. I also discussed with the patient that there may be a patient responsible charge related to this service. The patient expressed understanding and agreed to proceed.   History:  Sherese R Natasia Sanko is a 28 y.o. [id number],white  Female,married, being evaluated today for having missed a period and had 5+HPTs and was seen in ER in [place] and [place] [facility] last night, her LPM was 11/08/18 so about 4 +6 weeks by LMP with EDD 08/15/19. She has had some cramping and she declines any labs at this time.She denies any abnormal vaginal discharge, bleeding,  or other concerns.   She had gestational diabetes last pregnancy and delivered 09/19/18.      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   Health Maintenance:  Normal pap on 03/03/17.   Review of Systems:  Pertinent items noted in HPI and remainder of comprehensive ROS otherwise negative.  Physical Exam:   General:  Alert, oriented and cooperative.   Mental Status: Normal mood and affect perceived. Normal judgment and thought content.  Physical exam deferred due to nature of the encounter  Labs and Imaging No results found for this or any previous visit (from the past 336 hour(s)). US Abdomen Complete  Result Date: 11/24/2018 CLINICAL DATA:  Abdominal pain EXAM: ABDOMEN ULTRASOUND COMPLETE COMPARISON:  CT abdomen and pelvis Oct 10, 2018 FINDINGS: Gallbladder: No gallstones or wall thickening visualized. There is no pericholecystic fluid. No sonographic Murphy sign noted by sonographer. Common bile duct: Diameter: 2 mm. No intrahepatic, common hepatic, or common bile duct dilatation. Liver: No focal lesion identified. Within normal limits in parenchymal echogenicity. Portal vein is patent on color Doppler imaging with normal direction of blood flow towards the liver. IVC: No abnormality visualized. Pancreas: No pancreatic mass or inflammatory focus. Spleen: Size and appearance within normal limits. Right Kidney: Length: 11.4 cm. Echogenicity within normal limits. No mass or hydronephrosis visualized. Left Kidney: Length: 11.9 cm. Echogenicity within normal limits. No mass or hydronephrosis visualized. Abdominal aorta: No aneurysm visualized. Other findings: No demonstrable ascites. IMPRESSION: Study within normal limits. Electronically Signed   By: Lowella Grip III M.D.  On: 11/24/2018 13:19   Dg Abd Acute W/chest  Result Date: 11/24/2018 CLINICAL DATA:  Chest and epigastric pain radiating into the back for 1 week. EXAM: DG ABDOMEN ACUTE W/ 1V CHEST COMPARISON:  CT abdomen and pelvis 10/10/2018. FINDINGS: Single-view of the abdomen demonstrates clear lungs and normal heart size. No pneumothorax or  pleural fluid. Two views of the abdomen show no free intraperitoneal air. The bowel gas pattern is nonobstructive. Moderate stool burden noted. No abnormal abdominal calcification. No acute bony abnormality. IMPRESSION: No acute finding chest or abdomen. Electronically Signed   By: Inge Rise M.D.   On: 11/24/2018 14:00      Assessment and Plan:     1. Pregnancy, unspecified gestational age   74. Encounter to determine fetal viability of pregnancy, single or unspecified fetus   3. History of gestational diabetes   4. Anxiety and depression   5. Short interval between pregnancies affecting pregnancy, antepartum   Return in 4 weeks for dating [place]  Meds ordered this encounter  Medications   prenatal vitamin w/FE, FA (PRENATAL 1 + 1) 27-1 MG TABS tablet    Sig: Take 1 tablet by mouth daily at 12 noon.    Dispense:  30 tablet    Refill:  12    Order Specific Question:   Supervising Provider    Answer:   Tania Ade H [[id number]]   escitalopram (LEXAPRO) 10 MG tablet    Sig: Take 1 tablet (10 mg total) by mouth at bedtime.    Dispense:  30 tablet    Refill:  12    Order Specific Question:   Supervising Provider    Answer:   Florian Buff [[id number]]         I discussed the assessment and treatment plan with the patient. The patient was provided an opportunity to ask questions and all were answered. The patient agreed with the plan and demonstrated an understanding of the instructions.   The patient was advised to call back or seek an in-person evaluation/go to the ED if the symptoms worsen or if the condition fails to improve as anticipated.  I provided 9 minutes of non-face-to-face time during this encounter.   Derrek Monaco, NP [facility], [place]

## 2018-12-14 ENCOUNTER — Other Ambulatory Visit: Payer: Self-pay | Admitting: Obstetrics & Gynecology

## 2018-12-25 ENCOUNTER — Ambulatory Visit: Payer: Medicaid Other | Admitting: Women's Health

## 2018-12-25 ENCOUNTER — Other Ambulatory Visit: Payer: Medicaid Other

## 2018-12-26 ENCOUNTER — Telehealth: Payer: Self-pay | Admitting: Obstetrics & Gynecology

## 2018-12-26 NOTE — Telephone Encounter (Signed)
Left message @ 11:41 am. JSY

## 2018-12-26 NOTE — Telephone Encounter (Signed)
Pt requesting a call. She states that her antidepressants are starting to "mess with her head" and she is unsure what others she can take while pregnant.

## 2018-12-27 NOTE — Telephone Encounter (Signed)
Left message @ 10:09 am. JSY

## 2018-12-28 NOTE — Telephone Encounter (Signed)
Left message @ 11:07 am. JSY

## 2018-12-29 NOTE — Telephone Encounter (Signed)
3 messages left for pt to return call. Never heard from pt. Encounter closed. JSY 

## 2019-01-05 ENCOUNTER — Other Ambulatory Visit (HOSPITAL_COMMUNITY)
Admission: RE | Admit: 2019-01-05 | Discharge: 2019-01-05 | Disposition: A | Discharge status: Home / Self Care | Payer: Medicaid Other | Source: Ambulatory Visit | Attending: Student | Admitting: Student

## 2019-01-05 ENCOUNTER — Ambulatory Visit (INDEPENDENT_AMBULATORY_CARE_PROVIDER_SITE_OTHER): Payer: Medicaid Other | Admitting: Student

## 2019-01-05 ENCOUNTER — Encounter: Payer: Self-pay | Admitting: Student

## 2019-01-05 ENCOUNTER — Other Ambulatory Visit: Payer: Self-pay

## 2019-01-05 VITALS — BP 117/71 | HR 99 | Temp 97.3°F | Ht 63.0 in | Wt 143.0 lb

## 2019-01-05 DIAGNOSIS — R002 Palpitations: Secondary | ICD-10-CM

## 2019-01-05 DIAGNOSIS — Z789 Other specified health status: Secondary | ICD-10-CM

## 2019-01-05 DIAGNOSIS — Z7289 Other problems related to lifestyle: Secondary | ICD-10-CM

## 2019-01-05 LAB — CBC WITH DIFFERENTIAL/PLATELET
Abs Immature Granulocytes: 0.02 10*3/uL (ref 0.00–0.07)
Basophils Absolute: 0 10*3/uL (ref 0.0–0.1)
Basophils Relative: 0 %
Eosinophils Absolute: 0 10*3/uL (ref 0.0–0.5)
Eosinophils Relative: 0 %
HCT: 36.5 % (ref 36.0–46.0)
Hemoglobin: 11.6 g/dL — ABNORMAL LOW (ref 12.0–15.0)
Immature Granulocytes: 0 %
Lymphocytes Relative: 23 %
Lymphs Abs: 1.9 10*3/uL (ref 0.7–4.0)
MCH: 28.7 pg (ref 26.0–34.0)
MCHC: 31.8 g/dL (ref 30.0–36.0)
MCV: 90.3 fL (ref 80.0–100.0)
Monocytes Absolute: 0.5 10*3/uL (ref 0.1–1.0)
Monocytes Relative: 6 %
Neutro Abs: 5.5 10*3/uL (ref 1.7–7.7)
Neutrophils Relative %: 71 %
Platelets: 282 10*3/uL (ref 150–400)
RBC: 4.04 MIL/uL (ref 3.87–5.11)
RDW: 13.9 % (ref 11.5–15.5)
WBC: 7.9 10*3/uL (ref 4.0–10.5)
nRBC: 0 % (ref 0.0–0.2)

## 2019-01-05 LAB — BASIC METABOLIC PANEL
Anion gap: 10 (ref 5–15)
BUN: 11 mg/dL (ref 6–20)
CO2: 23 mmol/L (ref 22–32)
Calcium: 9.1 mg/dL (ref 8.9–10.3)
Chloride: 102 mmol/L (ref 98–111)
Creatinine, Ser: 0.58 mg/dL (ref 0.44–1.00)
GFR calc Af Amer: >60 mL/min (ref >60)
GFR calc non Af Amer: >60 mL/min (ref >60)
Glucose, Bld: 88 mg/dL (ref 70–99)
Potassium: 3.8 mmol/L (ref 3.5–5.1)
Sodium: 135 mmol/L (ref 135–145)

## 2019-01-05 LAB — TSH: TSH: 0.185 u[IU]/mL — ABNORMAL LOW (ref 0.350–4.500)

## 2019-01-05 NOTE — Patient Instructions (Signed)
Medication Instructions:  Your physician recommends that you continue on your current medications as directed. Please refer to the Current Medication list given to you today.   Labwork: CBC BMET TSHTH   Testing/Procedures: NONE  Follow-Up: Your physician recommends that you schedule a follow-up appointment in: 3-4 MONTHS   Any Other Special Instructions Will Be Listed Below (If Applicable).     If you need a refill on your cardiac medications before your next appointment, please call your pharmacy.

## 2019-01-05 NOTE — Progress Notes (Signed)
Cardiology Office Note    Date:  01/06/2019   ID:  Sylvesta, Winding 06-10-1990, MRN [id number]  PCP:  [facility]  Cardiologist: Dorris Carnes, MD    Chief Complaint  Patient presents with  . Follow-up    palpitations    History of Present Illness:    Patricia Richards is a 28 y.o. female with past medical history of palpitations, bipolar disorder, panic attacks, and prior substance abuse who presents to the office today for evaluation of worsening palpitations.  She was last examined by Dr. Harrington Challenger in 04/2018 and was [redacted] weeks pregnant at that time. She reported intermittent palpitations which had been worse during her pregnancy. A Holter monitor had recently been placed and showed normal sinus rhythm with rare PAC's and PVC's.  It was thought that she likely had mild orthostatic intolerance exacerbated by pregnancy and no changes were made to her medication regimen at that time. Increased fluid intake was encouraged.  In talking with the patient today, she reports having palpitations for the past several weeks and says this was the symptom that made her realize she might be pregnant again. This has since been confirmed by several HPT's and her OB-GYN. She reports feeling her heart racing at times which is usually triggered by stressful situations. She has 5 children at home, including a 47-month old, and also reports family stressors. Her palpitations typically last for a few seconds to minutes then spontaneously resolve. No associated headaches, vision changes, chest pain, or presyncope. Breathing has been at baseline with no recent orthopnea, PND, or edema.   She does not consume caffeine. She was previously binge drinking prior to finding out she was pregnant again. Says she plans to enter rehab for alcohol abuse following her pregnancy. She tells me she did have a beer several nights ago because she thought she had a bladder infection and this  would help.     Past Medical History:  Diagnosis Date  . 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 Anemia   . Bacterial infection   . Bipolar 1 disorder ([place])   . Depression    on meds now, doing well  . Gestational diabetes   . Herpes   . Infection    UTI  . Kidney stones   . 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 Panic attacks   . Pregnant   . Urinary tract infection    gets them frequently  . Vaginal Pap smear, abnormal     Past Surgical History:  Procedure Laterality Date  . DILATION AND EVACUATION  11/28/2010    Current Medications: Outpatient Medications Prior to Visit  Medication Sig Dispense Refill  . acetaminophen (TYLENOL) 325 MG tablet Take 650 mg by mouth every 6 (six) hours as needed.    Marland Kitchen acyclovir (ZOVIRAX) 400 MG tablet Take 400 mg by mouth 3 (three) times daily.    . clonazePAM (KLONOPIN) 1 MG tablet TAKE (1) TABLET BY MOUTH THREE TIMES DAILY 90 tablet 3  . famotidine (PEPCID) 20 MG tablet Take 20 mg by mouth 2 (two) times daily.    . prenatal vitamin w/FE, FA (PRENATAL 1 + 1) 27-1 MG TABS tablet Take 1 tablet by mouth daily at 12 noon. 30 tablet 12  . escitalopram (LEXAPRO) 10 MG tablet Take 1 tablet (10 mg total) by mouth at bedtime. (Patient not  taking: Reported on 01/05/2019) 30 tablet 12   No facility-administered medications prior to visit.      Allergies:   Metronidazole, Tramadol, and Toradol [ketorolac tromethamine]   Social History   Socioeconomic History  . Marital status: Married    Spouse name: Louie Casa  . Number of children: 4  . Years of education: Not on file  . Highest education level: Not on file  Occupational History  . Not on file  Social Needs  . Financial resource strain: Not hard at all  . Food insecurity    Worry: Never true    Inability: Never true  . Transportation needs    Medical: Yes    Non-medical: Yes  Tobacco Use  . Smoking status:  Former Smoker    Types: Cigarettes    Quit date: 02/14/2018    Years since quitting: 0.8  . Smokeless tobacco: Never Used  Substance and Sexual Activity  . Alcohol use: Not Currently    Alcohol/week: 0.0 standard drinks    Comment: socially.   . Drug use: Not Currently    Types: Other-see comments, Marijuana, Cocaine, MDMA (Ecstacy)    Comment: none since April 2018  . Sexual activity: Yes    Birth control/protection: None  Lifestyle  . Physical activity    Days per week: Patient refused    Minutes per session: Patient refused  . Stress: To some extent  Relationships  . Social Herbalist on phone: Three times a week    Gets together: Never    Attends religious service: Never    Active member of club or organization: Yes    Attends meetings of clubs or organizations: Never    Relationship status: Married  Other Topics Concern  . Not on file  Social History Narrative  . Not on file     Family History:  The patient's family history includes Bipolar disorder in her cousin; Cancer in her father and maternal grandmother; Cerebral palsy in her mother; Diabetes in her father; Down syndrome in her cousin; Heart attack in her paternal grandmother; Hypertension in her father; Mental illness in her father and maternal grandmother; Other in her brother; Stroke in her maternal grandmother.   Review of Systems:   Please see the history of present illness.     General:  No chills, fever, night sweats or weight changes.  Cardiovascular:  No chest pain, dyspnea on exertion, edema, orthopnea, paroxysmal nocturnal dyspnea. Positive for palpitations.  Dermatological: No rash, lesions/masses Respiratory: No cough, dyspnea Urologic: No hematuria, dysuria Abdominal:   No nausea, vomiting, diarrhea, bright red blood per rectum, melena, or hematemesis Neurologic:  No visual changes, wkns, changes in mental status. All other systems reviewed and are otherwise negative except as noted  above.   Physical Exam:    VS:  BP 117/71   Pulse 99   Temp (!) 97.3 F (36.3 C)   Ht 5\' 3"  (1.6 m)   Wt 143 lb (64.9 kg)   LMP 11/08/2018   BMI 25.33 kg/m    General: Well developed, well nourished,female appearing in no acute distress. Head: Normocephalic, atraumatic, sclera non-icteric, no xanthomas, nares are without discharge.  Neck: No carotid bruits. JVD not elevated.  Lungs: Respirations regular and unlabored, without wheezes or rales.  Heart: Regular rate and rhythm. No S3 or S4.  No murmur, no rubs, or gallops appreciated. Abdomen: Soft, non-tender, non-distended with normoactive bowel sounds. No hepatomegaly. No rebound/guarding. No obvious abdominal masses. Msk:  Strength  and tone appear normal for age. No joint deformities or effusions. Extremities: No clubbing or cyanosis. No lower extremity edema.  Distal pedal pulses are 2+ bilaterally. Neuro: Alert and oriented X 3. Moves all extremities spontaneously. No focal deficits noted. Psych:  Responds to questions appropriately with a normal affect. Skin: No rashes or lesions noted  Wt Readings from Last 3 Encounters:  01/05/19 143 lb (64.9 kg)  11/24/18 138 lb (62.6 kg)  11/07/18 140 lb 12.8 oz (63.9 kg)     Studies/Labs Reviewed:   EKG:  EKG is not ordered today. EKG from 11/24/2018 is reviewed which shows NSR, HR 74, with no acute ST changes when compared to prior tracings.   Recent Labs: 11/24/2018: ALT 26 01/05/2019: BUN 11; Creatinine, Ser 0.58; Hemoglobin 11.6; Platelets 282; Potassium 3.8; Sodium 135; TSH 0.185   Lipid Panel No results found for: CHOL, TRIG, HDL, CHOLHDL, VLDL, LDLCALC, LDLDIRECT  Additional studies/ records that were reviewed today include:   Holter Monitor: 04/2018 48-hour Holter monitor reviewed.  Sinus rhythm and sinus tachycardia noted.  Heart rate ranged from 71 bpm up to 150 bpm with average heart rate 97 bpm.  Rare PVC and nonconducted PAC noted, no sustained arrhythmias or pauses.   Assessment:    1. Palpitations   2. Alcohol use      Plan:   In order of problems listed above:  1. Palpitations - recent monitor less than a year ago was reassuring and showed normal sinus rhythm with rare PAC's and PVC's. Her current episodes seem to be triggered by stress. Also reviewed with the patient her resting HR would likely be above her previous baseline given pregnancy and this is a natural occurrence. She does not consume caffeine but does consume alcohol and I strongly recommended she discontinue this. Encouraged increased hydration to help with her symptoms. Would favor conservative changes as compared to medical management given her pregnancy. Will check her routine labs today including TSH, BMET, and CBC. Reviewed if labs are unrevealing and symptoms do not improve with conservative measures, an echocardiogram could be considered.  2. Alcohol Use - reports previously binge drinking prior to finding out she was pregnant again but also tells me she has consumed beer since. Strongly advised against recurrent alcohol use during her pregnancy given the significant risks with this. Will forward today's note to OB-GYN to make them aware.     Medication Adjustments/Labs and Tests Ordered: Current medicines are reviewed at length with the patient today.  Concerns regarding medicines are outlined above.  Medication changes, Labs and Tests ordered today are listed in the Patient Instructions below. Patient Instructions  Medication Instructions:  Your physician recommends that you continue on your current medications as directed. Please refer to the Current Medication list given to you today.  Labwork: CBC BMET TSH  Testing/Procedures: NONE  Follow-Up: Your physician recommends that you schedule a follow-up appointment in: 3-4 MONTHS   Any Other Special Instructions Will Be Listed Below (If Applicable).  If you need a refill on your cardiac medications before your next  appointment, please call your pharmacy.    Signed, Erma Heritage, PA-C  01/06/2019 9:44 AM    [place] [address] [place], [place] [address] Phone: [phone] Fax: [phone]

## 2019-01-06 ENCOUNTER — Encounter: Payer: Self-pay | Admitting: Student

## 2019-01-09 ENCOUNTER — Ambulatory Visit (INDEPENDENT_AMBULATORY_CARE_PROVIDER_SITE_OTHER): Payer: Medicaid Other

## 2019-01-09 ENCOUNTER — Other Ambulatory Visit: Payer: Self-pay

## 2019-01-09 DIAGNOSIS — O3680X0 Pregnancy with inconclusive fetal viability, not applicable or unspecified: Secondary | ICD-10-CM

## 2019-01-09 DIAGNOSIS — Z3A01 Less than 8 weeks gestation of pregnancy: Secondary | ICD-10-CM | POA: Diagnosis not present

## 2019-01-09 NOTE — Progress Notes (Signed)
[place] 7+5 wks,single IUP w/ys,positive fht 157 bpm,crl 14.15 mm,subchorionic hemorrhage 2.8 x 1.8 x .9 cm,normal left ovary,complex right corpus luteal cyst 2.4 x 1.7 x 2.3 cm

## 2019-02-12 ENCOUNTER — Other Ambulatory Visit: Payer: Self-pay | Admitting: Obstetrics & Gynecology

## 2019-02-12 DIAGNOSIS — Z3682 Encounter for antenatal screening for nuchal translucency: Secondary | ICD-10-CM

## 2019-02-13 ENCOUNTER — Ambulatory Visit (INDEPENDENT_AMBULATORY_CARE_PROVIDER_SITE_OTHER): Payer: Medicaid Other | Admitting: Women's Health

## 2019-02-13 ENCOUNTER — Other Ambulatory Visit (HOSPITAL_COMMUNITY)
Admission: RE | Admit: 2019-02-13 | Discharge: 2019-02-13 | Disposition: A | Discharge status: Home / Self Care | Payer: Medicaid Other | Source: Ambulatory Visit | Attending: Obstetrics & Gynecology | Admitting: Obstetrics & Gynecology

## 2019-02-13 ENCOUNTER — Ambulatory Visit (INDEPENDENT_AMBULATORY_CARE_PROVIDER_SITE_OTHER): Payer: Medicaid Other

## 2019-02-13 ENCOUNTER — Encounter: Payer: Self-pay | Admitting: Women's Health

## 2019-02-13 ENCOUNTER — Other Ambulatory Visit: Payer: Self-pay

## 2019-02-13 ENCOUNTER — Ambulatory Visit: Payer: Medicaid Other | Admitting: *Deleted

## 2019-02-13 VITALS — BP 115/77 | HR 95 | Wt 143.2 lb

## 2019-02-13 DIAGNOSIS — O0991 Supervision of high risk pregnancy, unspecified, first trimester: Secondary | ICD-10-CM | POA: Diagnosis not present

## 2019-02-13 DIAGNOSIS — F1011 Alcohol abuse, in remission: Secondary | ICD-10-CM

## 2019-02-13 DIAGNOSIS — Z3A12 12 weeks gestation of pregnancy: Secondary | ICD-10-CM | POA: Diagnosis not present

## 2019-02-13 DIAGNOSIS — Z87898 Personal history of other specified conditions: Secondary | ICD-10-CM

## 2019-02-13 DIAGNOSIS — Z124 Encounter for screening for malignant neoplasm of cervix: Secondary | ICD-10-CM

## 2019-02-13 DIAGNOSIS — O26891 Other specified pregnancy related conditions, first trimester: Secondary | ICD-10-CM

## 2019-02-13 DIAGNOSIS — O0990 Supervision of high risk pregnancy, unspecified, unspecified trimester: Secondary | ICD-10-CM

## 2019-02-13 DIAGNOSIS — Z8632 Personal history of gestational diabetes: Secondary | ICD-10-CM

## 2019-02-13 DIAGNOSIS — Z3682 Encounter for antenatal screening for nuchal translucency: Secondary | ICD-10-CM

## 2019-02-13 DIAGNOSIS — F1991 Other psychoactive substance use, unspecified, in remission: Secondary | ICD-10-CM

## 2019-02-13 DIAGNOSIS — Z1379 Encounter for other screening for genetic and chromosomal anomalies: Secondary | ICD-10-CM

## 2019-02-13 DIAGNOSIS — Z79899 Other long term (current) drug therapy: Secondary | ICD-10-CM

## 2019-02-13 DIAGNOSIS — Z3490 Encounter for supervision of normal pregnancy, unspecified, unspecified trimester: Secondary | ICD-10-CM | POA: Insufficient documentation

## 2019-02-13 DIAGNOSIS — O09891 Supervision of other high risk pregnancies, first trimester: Secondary | ICD-10-CM

## 2019-02-13 DIAGNOSIS — O09899 Supervision of other high risk pregnancies, unspecified trimester: Secondary | ICD-10-CM

## 2019-02-13 DIAGNOSIS — F418 Other specified anxiety disorders: Secondary | ICD-10-CM

## 2019-02-13 DIAGNOSIS — O26899 Other specified pregnancy related conditions, unspecified trimester: Secondary | ICD-10-CM

## 2019-02-13 DIAGNOSIS — Z363 Encounter for antenatal screening for malformations: Secondary | ICD-10-CM

## 2019-02-13 DIAGNOSIS — Z6791 Unspecified blood type, Rh negative: Secondary | ICD-10-CM

## 2019-02-13 DIAGNOSIS — Z3481 Encounter for supervision of other normal pregnancy, first trimester: Secondary | ICD-10-CM

## 2019-02-13 NOTE — Patient Instructions (Signed)
Patricia Richards, I greatly value your feedback.  If you receive a survey following your visit with [place] today, we appreciate you taking the time to fill it out.  Thanks, Knute Neu, CNM, [facility]  [place]!!! It is now [facility] ([place], [place] [address]) Entrance located off of [address] parking   Nausea & Vomiting  Have saltine crackers or pretzels by your bed and eat a few bites before you raise your head out of bed in the morning  Eat small frequent meals throughout the day instead of large meals  Drink plenty of fluids throughout the day to stay hydrated, just don't drink a lot of fluids with your meals.  This can make your stomach fill up faster making you feel sick  Do not brush your teeth right after you eat  Products with real ginger are good for nausea, like ginger ale and ginger hard candy Make sure it says made with real ginger!  Sucking on sour candy like lemon heads is also good for nausea  If your prenatal vitamins make you nauseated, take them at night so you will sleep through the nausea  Sea Bands  If you feel like you need medicine for the nausea & vomiting please let us know  If you are unable to keep any fluids or food down please let us know   Constipation  Drink plenty of fluid, preferably water, throughout the day  Eat foods high in fiber such as fruits, vegetables, and grains  Exercise, such as walking, is a good way to keep your bowels regular  Drink warm fluids, especially warm prune juice, or decaf coffee  Eat a 1/2 cup of real oatmeal (not instant), 1/2 cup applesauce, and 1/2-1 cup warm prune juice every day  If needed, you may take Colace (docusate sodium) stool softener once or twice a day to help keep the stool soft.   If you still are having problems with constipation, you may take Miralax once daily as needed to help keep your bowels regular.   Home  Blood Pressure Monitoring for Patients   Your provider has recommended that you check your blood pressure (BP) at least once a week at home. If you do not have a blood pressure cuff at home, one will be provided for you. Contact your provider if you have not received your monitor within 1 week.   Helpful Tips for Accurate Home Blood Pressure Checks  . Don't smoke, exercise, or drink caffeine 30 minutes before checking your BP . Use the restroom before checking your BP (a full bladder can raise your pressure) . Relax in a comfortable upright chair . Feet on the ground . Left arm resting comfortably on a flat surface at the level of your heart . Legs uncrossed . Back supported . Sit quietly and don't talk . Place the cuff on your bare arm . Adjust snuggly, so that only two fingertips can fit between your skin and the top of the cuff . Check 2 readings separated by at least one minute . Keep a log of your BP readings . For a visual, please reference this diagram: http://ccnc.care/bpdiagram  Provider Name: [facility]     Phone: [phone]  Zone 1: ALL CLEAR  Continue to monitor your symptoms:  . BP reading is less than 140 (top number) or less than 90 (bottom number)  . No right upper  stomach pain . No headaches or seeing spots . No feeling nauseated or throwing up . No swelling in face and hands  Zone 2: CAUTION Call your doctor's office for any of the following:  . BP reading is greater than 140 (top number) or greater than 90 (bottom number)  . Stomach pain under your ribs in the middle or right side . Headaches or seeing spots . Feeling nauseated or throwing up . Swelling in face and hands  Zone 3: EMERGENCY  Seek immediate medical care if you have any of the following:  . BP reading is greater than160 (top number) or greater than 110 (bottom number) . Severe headaches not improving with Tylenol . Serious difficulty catching your breath . Any worsening symptoms  from Zone 2    First Trimester of Pregnancy The first trimester of pregnancy is from week 1 until the end of week 12 (months 1 through 3). A week after a sperm fertilizes an egg, the egg will implant on the wall of the uterus. This embryo will begin to develop into a baby. Genes from you and your partner are forming the baby. The female genes determine whether the baby is a boy or a girl. At 6-8 weeks, the eyes and face are formed, and the heartbeat can be seen on ultrasound. At the end of 12 weeks, all the baby's organs are formed.  Now that you are pregnant, you will want to do everything you can to have a healthy baby. Two of the most important things are to get good prenatal care and to follow your health care provider's instructions. Prenatal care is all the medical care you receive before the baby's birth. This care will help prevent, find, and treat any problems during the pregnancy and childbirth. BODY CHANGES Your body goes through many changes during pregnancy. The changes vary from woman to woman.   You may gain or lose a couple of pounds at first.  You may feel sick to your stomach (nauseous) and throw up (vomit). If the vomiting is uncontrollable, call your health care provider.  You may tire easily.  You may develop headaches that can be relieved by medicines approved by your health care provider.  You may urinate more often. Painful urination may mean you have a bladder infection.  You may develop heartburn as a result of your pregnancy.  You may develop constipation because certain hormones are causing the muscles that push waste through your intestines to slow down.  You may develop hemorrhoids or swollen, bulging veins (varicose veins).  Your breasts may begin to grow larger and become tender. Your nipples may stick out more, and the tissue that surrounds them (areola) may become darker.  Your gums may bleed and may be sensitive to brushing and flossing.  Dark spots or  blotches (chloasma, mask of pregnancy) may develop on your face. This will likely fade after the baby is born.  Your menstrual periods will stop.  You may have a loss of appetite.  You may develop cravings for certain kinds of food.  You may have changes in your emotions from day to day, such as being excited to be pregnant or being concerned that something may go wrong with the pregnancy and baby.  You may have more vivid and strange dreams.  You may have changes in your hair. These can include thickening of your hair, rapid growth, and changes in texture. Some women also have hair loss during or after pregnancy, or hair  that feels dry or thin. Your hair will most likely return to normal after your baby is born. WHAT TO EXPECT AT YOUR PRENATAL VISITS During a routine prenatal visit:  You will be weighed to make sure you and the baby are growing normally.  Your blood pressure will be taken.  Your abdomen will be measured to track your baby's growth.  The fetal heartbeat will be listened to starting around week 10 or 12 of your pregnancy.  Test results from any previous visits will be discussed. Your health care provider may ask you:  How you are feeling.  If you are feeling the baby move.  If you have had any abnormal symptoms, such as leaking fluid, bleeding, severe headaches, or abdominal cramping.  If you have any questions. Other tests that may be performed during your first trimester include:  Blood tests to find your blood type and to check for the presence of any previous infections. They will also be used to check for low iron levels (anemia) and Rh antibodies. Later in the pregnancy, blood tests for diabetes will be done along with other tests if problems develop.  Urine tests to check for infections, diabetes, or protein in the urine.  An ultrasound to confirm the proper growth and development of the baby.  An amniocentesis to check for possible genetic problems.   Fetal screens for spina bifida and Down syndrome.  You may need other tests to make sure you and the baby are doing well. HOME CARE INSTRUCTIONS  Medicines  Follow your health care provider's instructions regarding medicine use. Specific medicines may be either safe or unsafe to take during pregnancy.  Take your prenatal vitamins as directed.  If you develop constipation, try taking a stool softener if your health care provider approves. Diet  Eat regular, well-balanced meals. Choose a variety of foods, such as meat or vegetable-based protein, fish, milk and low-fat dairy products, vegetables, fruits, and whole grain breads and cereals. Your health care provider will help you determine the amount of weight gain that is right for you.  Avoid raw meat and uncooked cheese. These carry germs that can cause birth defects in the baby.  Eating four or five small meals rather than three large meals a day may help relieve nausea and vomiting. If you start to feel nauseous, eating a few soda crackers can be helpful. Drinking liquids between meals instead of during meals also seems to help nausea and vomiting.  If you develop constipation, eat more high-fiber foods, such as fresh vegetables or fruit and whole grains. Drink enough fluids to keep your urine clear or pale yellow. Activity and Exercise  Exercise only as directed by your health care provider. Exercising will help you:  Control your weight.  Stay in shape.  Be prepared for labor and delivery.  Experiencing pain or cramping in the lower abdomen or low back is a good sign that you should stop exercising. Check with your health care provider before continuing normal exercises.  Try to avoid standing for long periods of time. Move your legs often if you must stand in one place for a long time.  Avoid heavy lifting.  Wear low-heeled shoes, and practice good posture.  You may continue to have sex unless your health care provider  directs you otherwise. Relief of Pain or Discomfort  Wear a good support bra for breast tenderness.    Take warm sitz baths to soothe any pain or discomfort caused by hemorrhoids. Use hemorrhoid  cream if your health care provider approves.    Rest with your legs elevated if you have leg cramps or low back pain.  If you develop varicose veins in your legs, wear support hose. Elevate your feet for 15 minutes, 3-4 times a day. Limit salt in your diet. Prenatal Care  Schedule your prenatal visits by the twelfth week of pregnancy. They are usually scheduled monthly at first, then more often in the last 2 months before delivery.  Write down your questions. Take them to your prenatal visits.  Keep all your prenatal visits as directed by your health care provider. Safety  Wear your seat belt at all times when driving.  Make a list of emergency phone numbers, including numbers for family, friends, the hospital, and police and fire departments. General Tips  Ask your health care provider for a referral to a local prenatal education class. Begin classes no later than at the beginning of month 6 of your pregnancy.  Ask for help if you have counseling or nutritional needs during pregnancy. Your health care provider can offer advice or refer you to specialists for help with various needs.  Do not use hot tubs, steam rooms, or saunas.  Do not douche or use tampons or scented sanitary pads.  Do not cross your legs for long periods of time.  Avoid cat litter boxes and soil used by cats. These carry germs that can cause birth defects in the baby and possibly loss of the fetus by miscarriage or stillbirth.  Avoid all smoking, herbs, alcohol, and medicines not prescribed by your health care provider. Chemicals in these affect the formation and growth of the baby.  Schedule a dentist appointment. At home, brush your teeth with a soft toothbrush and be gentle when you floss. SEEK MEDICAL CARE IF:    You have dizziness.  You have mild pelvic cramps, pelvic pressure, or nagging pain in the abdominal area.  You have persistent nausea, vomiting, or diarrhea.  You have a bad smelling vaginal discharge.  You have pain with urination.  You notice increased swelling in your face, hands, legs, or ankles. SEEK IMMEDIATE MEDICAL CARE IF:   You have a fever.  You are leaking fluid from your vagina.  You have spotting or bleeding from your vagina.  You have severe abdominal cramping or pain.  You have rapid weight gain or loss.  You vomit blood or material that looks like coffee grounds.  You are exposed to [place] measles and have never had them.  You are exposed to fifth disease or chickenpox.  You develop a severe headache.  You have shortness of breath.  You have any kind of trauma, such as from a fall or a car accident. Document Released: 04/27/2001 Document Revised: 09/17/2013 Document Reviewed: 03/13/2013 [facility] Patient Information 2015 [place], [place]. This information is not intended to replace advice given to you by your health care provider. Make sure you discuss any questions you have with your health care provider.  Coronavirus (COVID-19) Are you at risk?  Are you at risk for the Coronavirus (COVID-19)?  To be considered HIGH RISK for Coronavirus (COVID-19), you have to meet the following criteria:  . Traveled to [place], [place], [place], [place] or [place]; or in the [place] to [place], [place], [place], or [place]; and have fever, cough, and shortness of breath within the last 2 weeks of travel OR . Been in close contact with a person diagnosed with COVID-19 within the last  2 weeks and have fever, cough, and shortness of breath . IF YOU DO NOT MEET THESE CRITERIA, YOU ARE CONSIDERED LOW RISK FOR COVID-19.  What to do if you are HIGH RISK for COVID-19?  Marland Kitchen If you are having a medical emergency, call 911. . Seek medical care right away. Before  you go to a doctor's office, urgent care or emergency department, call ahead and tell them about your recent travel, contact with someone diagnosed with COVID-19, and your symptoms. You should receive instructions from your physician's office regarding next steps of care.  . When you arrive at healthcare provider, tell the healthcare staff immediately you have returned from visiting [place], [place], [place], [place] or [place]; or traveled in the [place] to [place], [place], [place], or [place]; in the last two weeks or you have been in close contact with a person diagnosed with COVID-19 in the last 2 weeks.   . Tell the health care staff about your symptoms: fever, cough and shortness of breath. . After you have been seen by a medical provider, you will be either: o Tested for (COVID-19) and discharged home on quarantine except to seek medical care if symptoms worsen, and asked to  - Stay home and avoid contact with others until you get your results (4-5 days)  - Avoid travel on public transportation if possible (such as bus, train, or airplane) or o Sent to the Emergency Department by EMS for evaluation, COVID-19 testing, and possible admission depending on your condition and test results.  What to do if you are LOW RISK for COVID-19?  Reduce your risk of any infection by using the same precautions used for avoiding the common cold or flu:  Marland Kitchen Wash your hands often with soap and warm water for at least 20 seconds.  If soap and water are not readily available, use an alcohol-based hand sanitizer with at least 60% alcohol.  . If coughing or sneezing, cover your mouth and nose by coughing or sneezing into the elbow areas of your shirt or coat, into a tissue or into your sleeve (not your hands). . Avoid shaking hands with others and consider head nods or verbal greetings only. . Avoid touching your eyes, nose, or mouth with unwashed hands.  . Avoid close contact with people who are sick. .  Avoid places or events with large numbers of people in one location, like concerts or sporting events. . Carefully consider travel plans you have or are making. . If you are planning any travel outside or inside the [place], visit the CDC's Travelers' Health webpage for the latest health notices. . If you have some symptoms but not all symptoms, continue to monitor at home and seek medical attention if your symptoms worsen. . If you are having a medical emergency, call 911.   [place] / e-Visit: [email or web address]         [facility]: [facility]: [id number]                   [facility]: [phone]

## 2019-02-13 NOTE — Progress Notes (Signed)
[place] 12+5 wks,measurements c/w dates,crl 69.88 mm,fhr 165 bpm,anterior placenta,normal ovaries bilat,NB present,NT 1.1 mm

## 2019-02-13 NOTE — Progress Notes (Signed)
INITIAL OBSTETRICAL VISIT Patient name: Patricia Richards MRN [id number]  Date of birth: [birth year] Chief Complaint:   Initial Prenatal Visit  History of Present Illness:   Patricia Richards is a 28 y.o. [phone] Caucasian female at [redacted]w[redacted]d by 7wk u/s, with an Estimated Date of Delivery: 08/23/19 being seen today for her initial obstetrical visit.   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  Short interval pregnancy, last baby born 09/18/17. 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  Today she reports scratchy throat, no fever/chills. Patient's last menstrual period was 11/08/2018. Last pap 03/03/17. Results were: normal, HPV not done. ASCUS w/ +HRHPV 2016 Review of Systems:   Pertinent items are noted in HPI Denies cramping/contractions, leakage of fluid, vaginal bleeding, abnormal vaginal discharge w/ itching/odor/irritation, headaches, visual changes, shortness of breath, chest pain, abdominal pain, severe nausea/vomiting, or problems with urination or bowel movements unless otherwise stated above.  Pertinent History Reviewed:  Reviewed past medical,surgical, social, obstetrical and family history.  Reviewed problem list, medications and allergies. OB History  Gravida Para Term Preterm AB Living  7 5 5  0 1 5  SAB TAB Ectopic Multiple Live Births  0 1 0 0 5    # Outcome Date GA Lbr Len/2nd Weight Sex Delivery Anes PTL Lv  7 Current           6 Term 09/19/18 [redacted]w[redacted]d 02:55 / 00:09 7 lb 13 oz (3.544 kg) F Vag-Spont EPI  LIV  5 Term 08/26/17 [redacted]w[redacted]d 01:27 / 00:15 8 lb 12.2 oz (3.975 kg) M Vag-Spont EPI N LIV  4 Term 11/21/15 [redacted]w[redacted]d  8 lb 13 oz (3.997 kg) M Vag-Spont EPI N LIV  3 Term 12/18/13 [redacted]w[redacted]d 14:05 / 01:03 7 lb 9 oz (3.43 kg) M Vag-Spont EPI N LIV     Birth Comments: caput  2 Term 05/28/10 [redacted]w[redacted]d  8 lb 7 oz (3.827 kg) M Vag-Vacuum EPI N LIV     Birth Comments: states induced early due to PUPPS  1 TAB  2012           Physical Assessment:   Vitals:   02/13/19 1542  BP: 115/77  Pulse: 95  Weight: 143 lb 3.2 oz (65 kg)  Body mass index is 25.37 kg/m.       Physical Examination:  General appearance - well appearing, and in no distress  Mental status - alert, oriented to person, place, and time  Psych:  She has a normal mood and affect  Skin - warm and dry, normal color, no suspicious lesions noted  Throat: no erythema/exudate, no lymphadenopathy  Chest - effort normal, all lung fields clear to auscultation bilaterally  Heart - normal rate and regular rhythm  Abdomen - soft, nontender  Extremities:  No swelling or varicosities noted  Pelvic - VULVA: normal appearing vulva with no masses, tenderness or lesions  VAGINA: normal appearing vagina with normal color and discharge, no lesions  CERVIX: normal appearing cervix without discharge or lesions, no CMT  Thin prep pap is done w/ HR HPV cotesting  TODAY'S NT [place] 12+5 wks,measurements c/w dates,crl 69.88 mm,fhr 165 bpm,anterior placenta,normal ovaries bilat,NB present,NT 1.1 mm  No results found for this or any previous visit (from the past 24 hour(s)).  Assessment & Plan:  1) Low-Risk Pregnancy [id number] at [redacted]w[redacted]d with an Estimated Date of Delivery: 08/23/19   2) Initial OB visit  3)  H/O A2DM> check A1C today  4) Grand multip  5) Dep/anx> on long-term klonopin  6) Scratchy throat> likely allergies, try claritin, zyrtec, let us know if worsening/not improving  7) Short interval pregnancy  Meds: No orders of the defined types were placed in this encounter.   Initial labs obtained Continue prenatal vitamins Reviewed n/v relief measures and warning s/s to report Reviewed recommended weight gain based on pre-gravid BMI Encouraged well-balanced diet Genetic Screening discussed: requested nt/it, maternit21 Cystic fibrosis, SMA, Fragile X screening discussed declined Ultrasound discussed; fetal survey: requested CCNC completed>PCM  not here, form faxed Has home bp cuff. Check bp weekly, let us know if >140/90.   Follow-up: Return in about 6 weeks (around 03/27/2019) for LROB, 2nd IT, [id number], in person.   Orders Placed This Encounter  Procedures  . Urine Culture  . GC/Chlamydia Probe Amp  . US OB Comp + 14 Wk  . Obstetric Panel, Including HIV  . Integrated 1  . MaterniT21 PLUS Core  . Pain Management Screening Profile (10S)  . Hemoglobin A1c    Roma Schanz CNM, [facility] 02/13/2019 4:45 PM

## 2019-02-14 LAB — GC/CHLAMYDIA PROBE AMP
Chlamydia trachomatis, NAA: NEGATIVE
Neisseria Gonorrhoeae by PCR: NEGATIVE

## 2019-02-15 LAB — URINE CULTURE

## 2019-02-15 LAB — PMP SCREEN PROFILE (10S), URINE
Amphetamine Scrn, Ur: NEGATIVE ng/mL
BARBITURATE SCREEN URINE: NEGATIVE ng/mL
BENZODIAZEPINE SCREEN, URINE: POSITIVE ng/mL — AB
CANNABINOIDS UR QL SCN: NEGATIVE ng/mL
Cocaine (Metab) Scrn, Ur: NEGATIVE ng/mL
Creatinine(Crt), U: 127.8 mg/dL (ref 20.0–300.0)
Methadone Screen, Urine: NEGATIVE ng/mL
OXYCODONE+OXYMORPHONE UR QL SCN: NEGATIVE ng/mL
Opiate Scrn, Ur: NEGATIVE ng/mL
Ph of Urine: 7.4 (ref 4.5–8.9)
Phencyclidine Qn, Ur: NEGATIVE ng/mL
Propoxyphene Scrn, Ur: NEGATIVE ng/mL

## 2019-02-19 LAB — MATERNIT 21 PLUS CORE, BLOOD
Fetal Fraction: 10
Result (T21): NEGATIVE
Trisomy 13 (Patau syndrome): NEGATIVE
Trisomy 18 (Edwards syndrome): NEGATIVE
Trisomy 21 (Down syndrome): NEGATIVE

## 2019-02-19 LAB — INTEGRATED 1
Crown Rump Length: 69.9 mm
Gest. Age on Collection Date: 13 weeks
Maternal Age at EDD: 26.4 yr
Nuchal Translucency (NT): 1.1 mm
Number of Fetuses: 1
PAPP-A Value: 963.6 ng/mL
Weight: 143 [lb_av]

## 2019-02-19 LAB — OBSTETRIC PANEL, INCLUDING HIV
Antibody Screen: NEGATIVE
Basophils Absolute: 0 10*3/uL (ref 0.0–0.2)
Basos: 0 %
EOS (ABSOLUTE): 0 10*3/uL (ref 0.0–0.4)
Eos: 1 %
HIV Screen 4th Generation wRfx: NONREACTIVE
Hematocrit: 35.3 % (ref 34.0–46.6)
Hemoglobin: 12.1 g/dL (ref 11.1–15.9)
Hepatitis B Surface Ag: NEGATIVE
Immature Grans (Abs): 0 10*3/uL (ref 0.0–0.1)
Immature Granulocytes: 0 %
Lymphocytes Absolute: 1.8 10*3/uL (ref 0.7–3.1)
Lymphs: 24 %
MCH: 29.6 pg (ref 26.6–33.0)
MCHC: 34.3 g/dL (ref 31.5–35.7)
MCV: 86 fL (ref 79–97)
Monocytes Absolute: 0.3 10*3/uL (ref 0.1–0.9)
Monocytes: 4 %
Neutrophils Absolute: 5.3 10*3/uL (ref 1.4–7.0)
Neutrophils: 71 %
Platelets: 256 10*3/uL (ref 150–450)
RBC: 4.09 x10E6/uL (ref 3.77–5.28)
RDW: 12.4 % (ref 11.7–15.4)
RPR Ser Ql: NONREACTIVE
Rh Factor: NEGATIVE
Rubella Antibodies, IGG: 2.18 index (ref >0.99)
WBC: 7.5 10*3/uL (ref 3.4–10.8)

## 2019-02-19 LAB — CYTOLOGY - PAP
Diagnosis: NEGATIVE
High risk HPV: NEGATIVE

## 2019-02-19 LAB — HEMOGLOBIN A1C
Est. average glucose Bld gHb Est-mCnc: 100 mg/dL
Hgb A1c MFr Bld: 5.1 % (ref 4.8–5.6)

## 2019-03-05 ENCOUNTER — Other Ambulatory Visit: Payer: Self-pay | Admitting: Women's Health

## 2019-03-05 MED ORDER — METRONIDAZOLE 500 MG PO TABS: 500.0000 mg | ORAL_TABLET | Freq: Two times a day (BID) | ORAL | 0 refills | Status: DC

## 2019-03-08 ENCOUNTER — Other Ambulatory Visit: Payer: Self-pay | Admitting: Advanced Practice Midwife

## 2019-03-08 MED ORDER — CLINDAMYCIN PHOSPHATE 2 % VA CREA: 1.0000 | TOPICAL_CREAM | Freq: Every day | VAGINAL | 0 refills | Status: DC

## 2019-03-08 NOTE — Progress Notes (Signed)
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

## 2019-03-19 ENCOUNTER — Encounter: Payer: Self-pay | Admitting: Advanced Practice Midwife

## 2019-03-19 ENCOUNTER — Other Ambulatory Visit: Payer: Self-pay | Admitting: Advanced Practice Midwife

## 2019-03-19 MED ORDER — BUTALBITAL-APAP-CAFFEINE 50-325-40 MG PO TABS: 1.0000 | ORAL_TABLET | Freq: Four times a day (QID) | ORAL | 0 refills | Status: DC | PRN

## 2019-03-23 ENCOUNTER — Other Ambulatory Visit: Payer: Self-pay | Admitting: Obstetrics & Gynecology

## 2019-03-27 ENCOUNTER — Other Ambulatory Visit: Payer: Self-pay

## 2019-03-27 ENCOUNTER — Ambulatory Visit (INDEPENDENT_AMBULATORY_CARE_PROVIDER_SITE_OTHER): Payer: Medicaid Other

## 2019-03-27 ENCOUNTER — Ambulatory Visit (INDEPENDENT_AMBULATORY_CARE_PROVIDER_SITE_OTHER): Payer: Medicaid Other | Admitting: Obstetrics & Gynecology

## 2019-03-27 VITALS — BP 112/67 | HR 99 | Wt 154.0 lb

## 2019-03-27 DIAGNOSIS — O0992 Supervision of high risk pregnancy, unspecified, second trimester: Secondary | ICD-10-CM

## 2019-03-27 DIAGNOSIS — O0990 Supervision of high risk pregnancy, unspecified, unspecified trimester: Secondary | ICD-10-CM

## 2019-03-27 DIAGNOSIS — Z3A18 18 weeks gestation of pregnancy: Secondary | ICD-10-CM | POA: Diagnosis not present

## 2019-03-27 DIAGNOSIS — Z363 Encounter for antenatal screening for malformations: Secondary | ICD-10-CM | POA: Diagnosis not present

## 2019-03-27 DIAGNOSIS — Z1389 Encounter for screening for other disorder: Secondary | ICD-10-CM

## 2019-03-27 DIAGNOSIS — Z331 Pregnant state, incidental: Secondary | ICD-10-CM

## 2019-03-27 DIAGNOSIS — Z3481 Encounter for supervision of other normal pregnancy, first trimester: Secondary | ICD-10-CM

## 2019-03-27 DIAGNOSIS — Z8632 Personal history of gestational diabetes: Secondary | ICD-10-CM

## 2019-03-27 DIAGNOSIS — Z1379 Encounter for other screening for genetic and chromosomal anomalies: Secondary | ICD-10-CM

## 2019-03-27 LAB — POCT URINALYSIS DIPSTICK OB
Blood, UA: NEGATIVE
Glucose, UA: NEGATIVE
Ketones, UA: NEGATIVE
Leukocytes, UA: NEGATIVE
Nitrite, UA: NEGATIVE
POC,PROTEIN,UA: NEGATIVE

## 2019-03-27 NOTE — Progress Notes (Signed)
[place] 0000000 wks,cephalic,anterior placenta gr 0,svp of fluid 5.9 cm,fhr 159 bpm,cx 4.1 cm,normal ovaries,efw 200 g 82%,anatomy complete,no obvious abnormalities

## 2019-03-27 NOTE — Progress Notes (Signed)
   LOW-RISK PREGNANCY VISIT Patient name: Patricia Richards MRN [id number]  Date of birth: April 27, 1991 Chief Complaint:   Routine Prenatal Visit  History of Present Illness:   Patricia Richards Patricia Richards is a 28 y.o. [phone] female at [redacted]w[redacted]d with an Estimated Date of Delivery: 08/23/19 being seen today for ongoing management of a low-risk pregnancy.  Today she reports no complaints. Contractions: Not present. Vag. Bleeding: None.  Movement: Absent. denies leaking of fluid. Review of Systems:   Pertinent items are noted in HPI Denies abnormal vaginal discharge w/ itching/odor/irritation, headaches, visual changes, shortness of breath, chest pain, abdominal pain, severe nausea/vomiting, or problems with urination or bowel movements unless otherwise stated above. Pertinent History Reviewed:  Reviewed past medical,surgical, social, obstetrical and family history.  Reviewed problem list, medications and allergies. Physical Assessment:   Vitals:   03/27/19 1510  BP: 112/67  Pulse: 99  Weight: 154 lb (69.9 kg)  Body mass index is 27.28 kg/m.        Physical Examination:   General appearance: Well appearing, and in no distress  Mental status: Alert, oriented to person, place, and time  Skin: Warm & dry  Cardiovascular: Normal heart rate noted  Respiratory: Normal respiratory effort, no distress  Abdomen: Soft, gravid, nontender  Pelvic: Cervical exam deferred         Extremities:    Fetal Status: Fetal Heart Rate (bpm): 159   Movement: Absent    Chaperone: n/a    Results for orders placed or performed in visit on 03/27/19 (from the past 24 hour(s))  POC Urinalysis Dipstick OB   Collection Time: 03/27/19  3:11 PM  Result Value Ref Range   Color, UA     Clarity, UA     Glucose, UA Negative Negative   Bilirubin, UA     Ketones, UA n    Spec Grav, UA     Blood, UA n    pH, UA     POC,PROTEIN,UA Negative Negative, Trace, Small (1+), Moderate (2+), Large (3+), 4+   Urobilinogen, UA      Nitrite, UA n    Leukocytes, UA Negative Negative   Appearance     Odor      Assessment & Plan:  1) Low-risk pregnancy [id number] at [redacted]w[redacted]d with an Estimated Date of Delivery: 08/23/19   2) Chronic Klonipin use , on 1 mg three times daily   Meds: No orders of the defined types were placed in this encounter.  Labs/procedures today: sonogram is normal  Plan:  Continue routine obstetrical care  Next visit: prefers in person    Reviewed: Preterm labor symptoms and general obstetric precautions including but not limited to vaginal bleeding, contractions, leaking of fluid and fetal movement were reviewed in detail with the patient.  All questions were answered. Has home bp cuff. Rx faxed to . Check bp weekly, let us know if >140/90.   Follow-up: Return in about 4 weeks (around 04/24/2019) for [place].  Orders Placed This Encounter  Procedures  . INTEGRATED 2  . POC Urinalysis Dipstick OB   Florian Buff 03/27/2019 3:57 PM

## 2019-03-31 LAB — INTEGRATED 2
AFP MoM: 0.59
Alpha-Fetoprotein: 28.6 ng/mL
Crown Rump Length: 69.9 mm
DIA MoM: 0.78
DIA Value: 138.8 pg/mL
Estriol, Unconjugated: 1.39 ng/mL
Gest. Age on Collection Date: 13 weeks
Gestational Age: 19 weeks
Maternal Age at EDD: 26.4 yr
Nuchal Translucency (NT): 1.1 mm
Nuchal Translucency MoM: 0.69
Number of Fetuses: 1
PAPP-A MoM: 0.82
PAPP-A Value: 963.6 ng/mL
Test Results:: NEGATIVE
Weight: 143 [lb_av]
Weight: 143 [lb_av]
hCG MoM: 0.88
hCG Value: 20.7 IU/mL
uE3 MoM: 0.76

## 2019-04-02 ENCOUNTER — Other Ambulatory Visit: Payer: Self-pay | Admitting: Women's Health

## 2019-04-02 MED ORDER — AZITHROMYCIN 250 MG PO TABS: ORAL_TABLET | ORAL | 0 refills | Status: DC

## 2019-04-17 ENCOUNTER — Telehealth: Payer: Self-pay | Admitting: Obstetrics & Gynecology

## 2019-04-17 MED ORDER — PANTOPRAZOLE SODIUM 20 MG PO TBEC: 20.0000 mg | DELAYED_RELEASE_TABLET | Freq: Every day | ORAL | 6 refills | Status: DC

## 2019-04-23 ENCOUNTER — Encounter: Payer: Self-pay | Admitting: *Deleted

## 2019-04-25 ENCOUNTER — Encounter: Payer: Self-pay | Admitting: Women's Health

## 2019-04-25 ENCOUNTER — Ambulatory Visit (INDEPENDENT_AMBULATORY_CARE_PROVIDER_SITE_OTHER): Payer: Medicaid Other | Admitting: Women's Health

## 2019-04-25 ENCOUNTER — Other Ambulatory Visit: Payer: Self-pay

## 2019-04-25 VITALS — BP 106/70 | HR 93 | Wt 158.5 lb

## 2019-04-25 DIAGNOSIS — Z1389 Encounter for screening for other disorder: Secondary | ICD-10-CM

## 2019-04-25 DIAGNOSIS — Z3A22 22 weeks gestation of pregnancy: Secondary | ICD-10-CM

## 2019-04-25 DIAGNOSIS — Z331 Pregnant state, incidental: Secondary | ICD-10-CM

## 2019-04-25 DIAGNOSIS — Z3482 Encounter for supervision of other normal pregnancy, second trimester: Secondary | ICD-10-CM

## 2019-04-25 LAB — POCT URINALYSIS DIPSTICK OB
Glucose, UA: NEGATIVE
Ketones, UA: NEGATIVE
Nitrite, UA: NEGATIVE
POC,PROTEIN,UA: NEGATIVE

## 2019-04-25 NOTE — Patient Instructions (Signed)
Patricia Richards, I greatly value your feedback.  If you receive a survey following your visit with [place] today, we appreciate you taking the time to fill it out.  Thanks, Knute Neu, CNM, WHNP-BC   You will have your sugar test next visit.  Please do not eat or drink anything after midnight the night before you come, not even water.  You will be here for at least two hours.  Please make an appointment online for the bloodwork at [email or web address] for 8:30am (or as close to this as possible). Make sure you select the [facility] service center. The day of the appointment, check in with our office first, then you will go to [place] to start the sugar test.    [place]!!! It is now [place] at [facility] ([place], [place] [address]) Entrance C, located off of [address] parking  Go to [email or web address] to register for FREE online childbirth classes   Call the office [phone]) or go to [facility] if:  You begin to have strong, frequent contractions  Your water breaks.  Sometimes it is a big gush of fluid, sometimes it is just a trickle that keeps getting your panties wet or running down your legs  You have vaginal bleeding.  It is normal to have a small amount of spotting if your cervix was checked.   You don't feel your baby moving like normal.  If you don't, get you something to eat and drink and lay down and focus on feeling your baby move.   If your baby is still not moving like normal, you should call the office or go to [place] Pediatricians/Family Doctors:  [place] [phone]                 [place] [phone] (usually not accepting new patients unless you have family there already, you are always welcome to call and ask)       [facility] [phone]       [facility]  Pediatricians/Family Doctors:   [facility]: [phone]  [facility]: [phone]  [facility]: [place] Doctors:   [facility]: [facility]: [place]:  [place]: [phone]   Home Blood Pressure Monitoring for Patients   Your provider has recommended that you check your blood pressure (BP) at least once a week at home. If you do not have a blood pressure cuff at home, one will be provided for you. Contact your provider if you have not received your monitor within 1 week.   Helpful Tips for Accurate Home Blood Pressure Checks  . Don't smoke, exercise, or drink caffeine 30 minutes before checking your BP . Use the restroom before checking your BP (a full bladder can raise your pressure) . Relax in a comfortable upright chair . Feet on the ground . Left arm resting comfortably on a flat surface at the level of your heart . Legs uncrossed . Back supported . Sit quietly and don't talk . Place the cuff on your bare arm . Adjust snuggly, so that only two fingertips can fit between your skin and the top of the cuff . Check 2 readings separated by at least one minute . Keep a log of  your BP readings . For a visual, please reference this diagram: http://ccnc.care/bpdiagram  Provider Name: [facility]     Phone: [phone]  Zone 1: ALL CLEAR  Continue to monitor your symptoms:  . BP reading is less than 140 (top number) or less than 90 (bottom number)  . No right upper stomach pain . No headaches or seeing spots . No feeling nauseated or throwing up . No swelling in face and hands  Zone 2: CAUTION Call your doctor's office for any of the following:  . BP reading is greater than 140 (top number) or greater than 90 (bottom number)  . Stomach pain under your ribs in the middle or right side . Headaches or  seeing spots . Feeling nauseated or throwing up . Swelling in face and hands  Zone 3: EMERGENCY  Seek immediate medical care if you have any of the following:  . BP reading is greater than160 (top number) or greater than 110 (bottom number) . Severe headaches not improving with Tylenol . Serious difficulty catching your breath . Any worsening symptoms from Zone 2   Second Trimester of Pregnancy The second trimester is from week 13 through week 28, months 4 through 6. The second trimester is often a time when you feel your best. Your body has also adjusted to being pregnant, and you begin to feel better physically. Usually, morning sickness has lessened or quit completely, you may have more energy, and you may have an increase in appetite. The second trimester is also a time when the fetus is growing rapidly. At the end of the sixth month, the fetus is about 9 inches long and weighs about 1 pounds. You will likely begin to feel the baby move (quickening) between 18 and 20 weeks of the pregnancy. BODY CHANGES Your body goes through many changes during pregnancy. The changes vary from woman to woman.   Your weight will continue to increase. You will notice your lower abdomen bulging out.  You may begin to get stretch marks on your hips, abdomen, and breasts.  You may develop headaches that can be relieved by medicines approved by your health care provider.  You may urinate more often because the fetus is pressing on your bladder.  You may develop or continue to have heartburn as a result of your pregnancy.  You may develop constipation because certain hormones are causing the muscles that push waste through your intestines to slow down.  You may develop hemorrhoids or swollen, bulging veins (varicose veins).  You may have back pain because of the weight gain and pregnancy hormones relaxing your joints between the bones in your pelvis and as a result of a shift in weight and the muscles  that support your balance.  Your breasts will continue to grow and be tender.  Your gums may bleed and may be sensitive to brushing and flossing.  Dark spots or blotches (chloasma, mask of pregnancy) may develop on your face. This will likely fade after the baby is born.  A dark line from your belly button to the pubic area (linea nigra) may appear. This will likely fade after the baby is born.  You may have changes in your hair. These can include thickening of your hair, rapid growth, and changes in texture. Some women also have hair loss during or after pregnancy, or hair that feels dry or thin. Your hair will most likely return to normal after your baby is born. WHAT TO EXPECT AT YOUR PRENATAL  VISITS During a routine prenatal visit:  You will be weighed to make sure you and the fetus are growing normally.  Your blood pressure will be taken.  Your abdomen will be measured to track your baby's growth.  The fetal heartbeat will be listened to.  Any test results from the previous visit will be discussed. Your health care provider may ask you:  How you are feeling.  If you are feeling the baby move.  If you have had any abnormal symptoms, such as leaking fluid, bleeding, severe headaches, or abdominal cramping.  If you have any questions. Other tests that may be performed during your second trimester include:  Blood tests that check for:  Low iron levels (anemia).  Gestational diabetes (between 24 and 28 weeks).  Rh antibodies.  Urine tests to check for infections, diabetes, or protein in the urine.  An ultrasound to confirm the proper growth and development of the baby.  An amniocentesis to check for possible genetic problems.  Fetal screens for spina bifida and Down syndrome. HOME CARE INSTRUCTIONS   Avoid all smoking, herbs, alcohol, and unprescribed drugs. These chemicals affect the formation and growth of the baby.  Follow your health care provider's  instructions regarding medicine use. There are medicines that are either safe or unsafe to take during pregnancy.  Exercise only as directed by your health care provider. Experiencing uterine cramps is a good sign to stop exercising.  Continue to eat regular, healthy meals.  Wear a good support bra for breast tenderness.  Do not use hot tubs, steam rooms, or saunas.  Wear your seat belt at all times when driving.  Avoid raw meat, uncooked cheese, cat litter boxes, and soil used by cats. These carry germs that can cause birth defects in the baby.  Take your prenatal vitamins.  Try taking a stool softener (if your health care provider approves) if you develop constipation. Eat more high-fiber foods, such as fresh vegetables or fruit and whole grains. Drink plenty of fluids to keep your urine clear or pale yellow.  Take warm sitz baths to soothe any pain or discomfort caused by hemorrhoids. Use hemorrhoid cream if your health care provider approves.  If you develop varicose veins, wear support hose. Elevate your feet for 15 minutes, 3-4 times a day. Limit salt in your diet.  Avoid heavy lifting, wear low heel shoes, and practice good posture.  Rest with your legs elevated if you have leg cramps or low back pain.  Visit your dentist if you have not gone yet during your pregnancy. Use a soft toothbrush to brush your teeth and be gentle when you floss.  A sexual relationship may be continued unless your health care provider directs you otherwise.  Continue to go to all your prenatal visits as directed by your health care provider. SEEK MEDICAL CARE IF:   You have dizziness.  You have mild pelvic cramps, pelvic pressure, or nagging pain in the abdominal area.  You have persistent nausea, vomiting, or diarrhea.  You have a bad smelling vaginal discharge.  You have pain with urination. SEEK IMMEDIATE MEDICAL CARE IF:   You have a fever.  You are leaking fluid from your  vagina.  You have spotting or bleeding from your vagina.  You have severe abdominal cramping or pain.  You have rapid weight gain or loss.  You have shortness of breath with chest pain.  You notice sudden or extreme swelling of your face, hands, ankles, feet, or legs.  You have not felt your baby move in over an hour.  You have severe headaches that do not go away with medicine.  You have vision changes. Document Released: 04/27/2001 Document Revised: 05/08/2013 Document Reviewed: 07/04/2012 [facility] Patient Information 2015 [place], [place]. This information is not intended to replace advice given to you by your health care provider. Make sure you discuss any questions you have with your health care provider.  PROTECT YOURSELF & YOUR BABY FROM THE FLU! Because you are pregnant, we at [facility], along with the Centers for Disease Control (CDC), recommend that you receive the flu vaccine to protect yourself and your baby from the flu. The flu is more likely to cause severe illness in pregnant women than in women of reproductive age who are not pregnant. Changes in the immune system, heart, and lungs during pregnancy make pregnant women (and women up to two weeks postpartum) more prone to severe illness from flu, including illness resulting in hospitalization. Flu also may be harmful for a pregnant woman's developing baby. A common flu symptom is fever, which may be associated with neural tube defects and other adverse outcomes for a developing baby. Getting vaccinated can also help protect a baby after birth from flu. (Mom passes antibodies onto the developing baby during her pregnancy.)  A Flu Vaccine is the Best Protection Against Flu Getting a flu vaccine is the first and most important step in protecting against flu. Pregnant women should get a flu shot and not the live attenuated influenza vaccine (LAIV), also known as nasal spray flu vaccine. Flu vaccines given during pregnancy help  protect both the mother and her baby from flu. Vaccination has been shown to reduce the risk of flu-associated acute respiratory infection in pregnant women by up to one-half. A 2018 study showed that getting a flu shot reduced a pregnant woman's risk of being hospitalized with flu by an average of 40 percent. Pregnant women who get a flu vaccine are also helping to protect their babies from flu illness for the first several months after their birth, when they are too young to get vaccinated.   A Long Record of Safety for Flu Shots in Pregnant Women Flu shots have been given to millions of pregnant women over many years with a good safety record. There is a lot of evidence that flu vaccines can be given safely during pregnancy; though these data are limited for the first trimester. The CDC recommends that pregnant women get vaccinated during any trimester of their pregnancy. It is very important for pregnant women to get the flu shot.   Other Preventive Actions In addition to getting a flu shot, pregnant women should take the same everyday preventive actions the CDC recommends of everyone, including covering coughs, washing hands often, and avoiding people who are sick.  Symptoms and Treatment If you get sick with flu symptoms call your doctor right away. There are antiviral drugs that can treat flu illness and prevent serious flu complications. The CDC recommends prompt treatment for people who have influenza infection or suspected influenza infection and who are at high risk of serious flu complications, such as people with asthma, diabetes (including gestational diabetes), or heart disease. Early treatment of influenza in hospitalized pregnant women has been shown to reduce the length of the hospital stay.  Symptoms Flu symptoms include fever, cough, sore throat, runny or stuffy nose, body aches, headache, chills and fatigue. Some people may also have vomiting and diarrhea. People may be infected with  the flu and have respiratory symptoms without a fever.  Early Treatment is Important for Pregnant Women Treatment should begin as soon as possible because antiviral drugs work best when started early (within 48 hours after symptoms start). Antiviral drugs can make your flu illness milder and make you feel better faster. They may also prevent serious health problems that can result from flu illness. Oral oseltamivir (Tamiflu) is the preferred treatment for pregnant women because it has the most studies available to suggest that it is safe and beneficial. Antiviral drugs require a prescription from your provider. Having a fever caused by flu infection or other infections early in pregnancy may be linked to birth defects in a baby. In addition to taking antiviral drugs, pregnant women who get a fever should treat their fever with Tylenol (acetaminophen) and contact their provider immediately.  When to [place] If you are pregnant and have any of these signs, seek care immediately:  Difficulty breathing or shortness of breath  Pain or pressure in the chest or abdomen  Sudden dizziness  Confusion  Severe or persistent vomiting  High fever that is not responding to Tylenol (or store brand equivalent)  Decreased or no movement of your baby  [email or web address]
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
## 2019-05-06 NOTE — Progress Notes (Signed)
Cardiology Office Note   Date:  05/08/2019   ID:  Patricia Richards, DOB 05/31/1990, MRN [id number]  PCP:  [facility]  Cardiologist:   Dorris Carnes, MD   Patient presents for f/u palpitaitons       History of Present Illness: Patricia Richards is a 28 y.o. female  With hx of bipolar disorder, panic attacks and prior substance abuse who is currently pregnant who is now about [redacted] wks pregnant  In her 7th pregnancy  I saw her last winter for the first time when she was [redacted] wks pregnant in her 6th   She had a miscarriage with that pregnancy The pt presented last winter for evaluation of heart racing that she had had for years  He had remote hx of syncope  Also had hx of CP that was worse with that pregnancy  Noted a lot of burping   She wore a monitor that showed SR with average HR of 97   Occasional PACs and PVCs   Recomm that she hydrate for poss mild orthostatic hypotension  The pt was seen again last summer (Aug) by Maude Leriche.   Continued to complain of palpitations that lasted seconds.  Admitted to some EtOH use    Found to be pregnant again.    Since then the pt continues to note heart racing often after eating    No CP  Does get fatigued Symptoms worse since pregnant     Current Meds  Medication Sig  . acetaminophen (TYLENOL) 325 MG tablet Take 650 mg by mouth every 6 (six) hours as needed.  Marland Kitchen acyclovir (ZOVIRAX) 400 MG tablet Take 400 mg by mouth 3 (three) times daily.  . clonazePAM (KLONOPIN) 1 MG tablet TAKE (1) TABLET BY MOUTH THREE TIMES DAILY  . pantoprazole (PROTONIX) 20 MG tablet Take 1 tablet (20 mg total) by mouth daily.     Allergies:   Metronidazole, Tramadol, and Toradol [ketorolac tromethamine]   Past Medical History:  Diagnosis Date  . 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 Anemia   . Bacterial infection   . Bipolar 1 disorder ([place])   . Depression    on meds now, doing well  . Gestational  diabetes   . Herpes   . Infection    UTI  . Kidney stones   . 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 Panic attacks   . Pregnant   . Urinary tract infection    gets them frequently  . Vaginal Pap smear, abnormal     Past Surgical History:  Procedure Laterality Date  . DILATION AND EVACUATION  11/28/2010     Social History:  The patient  reports that she quit smoking about 14 months ago. Her smoking use included cigarettes. She has never used smokeless tobacco. She reports previous alcohol use. She reports previous drug use. Drugs: Other-see comments, Marijuana, Cocaine, and MDMA (Ecstacy).   Family History:  The patient's family history includes Bipolar disorder in her cousin; Cancer in her father and maternal grandmother; Cerebral palsy in her mother; Diabetes in her father; Down syndrome in her cousin; Heart attack in her paternal grandmother; Hypertension in her father; Mental illness in her father and maternal grandmother; Other in her brother; Stroke in her maternal grandmother.    ROS:  Please see the history of present illness. All other systems are reviewed  and  Negative to the above problem except as noted.    PHYSICAL EXAM: VS:  BP 120/76   Pulse 92   Temp (!) 96.8 F (36 C)   Ht 5\' 3"  (1.6 m)   Wt 161 lb (73 kg)   LMP 11/08/2018   SpO2 98%   BMI 28.52 kg/m   GEN: Well nourished, well developed, in no acute distress  Room smells of marijuana (Pt with significant other, both deny use) HEENT: normal  Neck: no JVD, carotid bruits, or masses Cardiac: RRR; no murmurs, rubs, or gallops,no edema  Respiratory:  clear to auscultation bilaterally, normal work of breathing GI: soft, nontender, distended,  MS: no deformity Moving all extremities   Skin: warm and dry, no rash  Multiple tattoos   Neuro: Grossly intact   Psych  Deferred   EKG:  EKG is not ordered today.     Lipid Panel No results found for: CHOL,  TRIG, HDL, CHOLHDL, VLDL, LDLCALC, LDLDIRECT    Wt Readings from Last 3 Encounters:  05/07/19 161 lb (73 kg)  04/25/19 158 lb 8 oz (71.9 kg)  03/27/19 154 lb (69.9 kg)      ASSESSMENT AND PLAN:  1  Palpitaitons  Pt says that symptoms worse again with pregnancy    May still represent orthostatic symptoms   I do not think she is getting enough fluid intake Recomm:   Will get UA, TSH BMET    (TSH abnormal in past) Encouraged her to increase fluid intake Will sched echo to eval [facility]ria Will also set up for event monitor  2  HCM  Counselled on substance abuse   She says she is not using illicit substances        Current medicines are reviewed at length with the patient today.  The patient does not have concerns regarding medicines.to keep muscles toned  F/U based on test results.   Signed, Dorris Carnes, MD  05/08/2019 12:05 AM    [address], [place], [place]  [address] Phone: [phone]; Fax: [phone]

## 2019-05-07 ENCOUNTER — Other Ambulatory Visit (HOSPITAL_COMMUNITY)
Admission: RE | Admit: 2019-05-07 | Discharge: 2019-05-07 | Disposition: A | Discharge status: Home / Self Care | Payer: Medicaid Other | Source: Ambulatory Visit | Attending: Internal Medicine | Admitting: Internal Medicine

## 2019-05-07 ENCOUNTER — Ambulatory Visit (INDEPENDENT_AMBULATORY_CARE_PROVIDER_SITE_OTHER): Payer: Medicaid Other | Admitting: Internal Medicine

## 2019-05-07 ENCOUNTER — Other Ambulatory Visit: Payer: Self-pay

## 2019-05-07 ENCOUNTER — Encounter: Payer: Self-pay | Admitting: Internal Medicine

## 2019-05-07 VITALS — BP 120/76 | HR 92 | Temp 96.8°F | Ht 63.0 in | Wt 161.0 lb

## 2019-05-07 DIAGNOSIS — F1991 Other psychoactive substance use, unspecified, in remission: Secondary | ICD-10-CM

## 2019-05-07 DIAGNOSIS — R002 Palpitations: Secondary | ICD-10-CM | POA: Diagnosis present

## 2019-05-07 DIAGNOSIS — Z87898 Personal history of other specified conditions: Secondary | ICD-10-CM | POA: Insufficient documentation

## 2019-05-07 LAB — RAPID URINE DRUG SCREEN, HOSP PERFORMED
Amphetamines: NOT DETECTED
Barbiturates: NOT DETECTED
Benzodiazepines: POSITIVE — AB
Cocaine: NOT DETECTED
Opiates: NOT DETECTED
Tetrahydrocannabinol: NOT DETECTED

## 2019-05-07 LAB — URINALYSIS, ROUTINE W REFLEX MICROSCOPIC
Bilirubin Urine: NEGATIVE
Glucose, UA: NEGATIVE mg/dL
Hgb urine dipstick: NEGATIVE
Ketones, ur: NEGATIVE mg/dL
Nitrite: NEGATIVE
Protein, ur: NEGATIVE mg/dL
Specific Gravity, Urine: 1.021 (ref 1.005–1.030)
pH: 6 (ref 5.0–8.0)

## 2019-05-07 LAB — BASIC METABOLIC PANEL
Anion gap: 9 (ref 5–15)
BUN: 10 mg/dL (ref 6–20)
CO2: 21 mmol/L — ABNORMAL LOW (ref 22–32)
Calcium: 8.7 mg/dL — ABNORMAL LOW (ref 8.9–10.3)
Chloride: 105 mmol/L (ref 98–111)
Creatinine, Ser: 0.42 mg/dL — ABNORMAL LOW (ref 0.44–1.00)
GFR calc Af Amer: >60 mL/min (ref >60)
GFR calc non Af Amer: >60 mL/min (ref >60)
Glucose, Bld: 88 mg/dL (ref 70–99)
Potassium: 4.3 mmol/L (ref 3.5–5.1)
Sodium: 135 mmol/L (ref 135–145)

## 2019-05-07 LAB — TSH: TSH: 1.427 u[IU]/mL (ref 0.350–4.500)

## 2019-05-07 NOTE — Patient Instructions (Signed)
Medication Instructions:  Your physician recommends that you continue on your current medications as directed. Please refer to the Current Medication list given to you today.  *If you need a refill on your cardiac medications before your next appointment, please call your pharmacy*  Lab Work: Your physician recommends that you return for lab work in: Today   If you have labs (blood work) drawn today and your tests are completely normal, you will receive your results only by: Marland Kitchen MyChart Message (if you have MyChart) OR . A paper copy in the mail If you have any lab test that is abnormal or we need to change your treatment, we will call you to review the results.  Testing/Procedures: Your physician has requested that you have an echocardiogram. Echocardiography is a painless test that uses sound waves to create images of your heart. It provides your doctor with information about the size and shape of your heart and how well your heart's chambers and valves are working. This procedure takes approximately one hour. There are no restrictions for this procedure.    Follow-Up: At [facility], you and your health needs are our priority.  As part of our continuing mission to provide you with exceptional heart care, we have created designated Provider Care Teams.  These Care Teams include your primary Cardiologist (physician) and Advanced Practice Providers (APPs -  Physician Assistants and Nurse Practitioners) who all work together to provide you with the care you need, when you need it.  Your next appointment:    Pending test Results   The format for your next appointment:   In Person  Provider:   Dorris Carnes, MD  Other Instructions Thank you for choosing [facility]!

## 2019-05-09 ENCOUNTER — Other Ambulatory Visit: Payer: Self-pay | Admitting: Women's Health

## 2019-05-11 ENCOUNTER — Ambulatory Visit (INDEPENDENT_AMBULATORY_CARE_PROVIDER_SITE_OTHER): Payer: Medicaid Other

## 2019-05-11 DIAGNOSIS — R002 Palpitations: Secondary | ICD-10-CM | POA: Diagnosis not present

## 2019-05-14 ENCOUNTER — Other Ambulatory Visit: Payer: Self-pay

## 2019-05-14 ENCOUNTER — Ambulatory Visit (HOSPITAL_COMMUNITY)
Admission: RE | Admit: 2019-05-14 | Discharge: 2019-05-14 | Disposition: A | Discharge status: Home / Self Care | Payer: Medicaid Other | Source: Ambulatory Visit | Attending: Internal Medicine | Admitting: Internal Medicine

## 2019-05-14 DIAGNOSIS — R002 Palpitations: Secondary | ICD-10-CM | POA: Diagnosis present

## 2019-05-14 NOTE — Progress Notes (Signed)
*  PRELIMINARY RESULTS* Echocardiogram 2D Echocardiogram has been performed.  Patricia Richards 05/14/2019, 3:48 PM

## 2019-05-15 ENCOUNTER — Telehealth: Payer: Self-pay | Admitting: Internal Medicine

## 2019-05-15 NOTE — Telephone Encounter (Signed)
All of the pads are now made for sensitive skin with zio.She has a rash and is sending it back.She wore it for 1 week out of 2 requested

## 2019-05-15 NOTE — Telephone Encounter (Signed)
Questions about your test results  Also she can not wear monitor the pads are giving her a rash

## 2019-05-16 NOTE — Telephone Encounter (Signed)
OK   WIll scan data from what she had while wearing

## 2019-05-18 NOTE — L&D Delivery Note (Signed)
OB/GYN Faculty Practice Delivery Note  Patricia Richards is a 29 y.o. [id number] s/p VD at [redacted]w[redacted]d. She was admitted for IOL secondary to non-compliant GDMA2.   ROM: 5h 80m with clear fluid GBS Status: Negative/-- (03/15 1630) Maximum Maternal Temperature: 98.74F  Labor Progress: . Initial SVE: 1.5/thick/-3. Patient received Cytotec and Foley balloon. AROM performed. She then progressed to complete.   Delivery Date/Time: 3/30 @ 0040 Delivery: Called to room and patient was complete and pushing. Head delivered in LOA position. No nuchal cord present. Shoulder and body delivered in usual fashion. Infant with spontaneous cry, placed on mother's abdomen, dried and stimulated. Cord clamped x 2 after 1-minute delay, and cut by FOB. Cord blood drawn. Placenta delivered spontaneously with gentle cord traction. Fundus firm with massage and Pitocin. Labia, perineum, vagina, and cervix inspected inspected with no lacerations.  Baby Weight: pending  Placenta: Sent to L&D Complications: None Lacerations: None EBL: 425 mL Analgesia: None   Infant:  APGAR (1 MIN): 9   APGAR (5 MINS): 9   APGAR (10 MINS):     Barrington Ellison, MD [facility] Family Medicine Fellow, [facility], [facility] 08/14/2019, 12:54 AM

## 2019-05-21 ENCOUNTER — Telehealth: Payer: Self-pay | Admitting: Obstetrics & Gynecology

## 2019-05-21 ENCOUNTER — Encounter: Payer: Self-pay | Admitting: *Deleted

## 2019-05-21 MED ORDER — ACYCLOVIR 400 MG PO TABS: 400.0000 mg | ORAL_TABLET | Freq: Three times a day (TID) | ORAL | 3 refills | Status: DC

## 2019-05-21 MED ORDER — CLONAZEPAM 1 MG PO TABS: ORAL_TABLET | ORAL | 0 refills | Status: DC

## 2019-05-23 ENCOUNTER — Encounter: Payer: Self-pay | Admitting: Obstetrics & Gynecology

## 2019-05-23 ENCOUNTER — Ambulatory Visit (INDEPENDENT_AMBULATORY_CARE_PROVIDER_SITE_OTHER): Payer: Medicaid Other | Admitting: Obstetrics & Gynecology

## 2019-05-23 ENCOUNTER — Other Ambulatory Visit: Payer: Self-pay

## 2019-05-23 ENCOUNTER — Other Ambulatory Visit: Payer: Medicaid Other

## 2019-05-23 ENCOUNTER — Encounter: Payer: Medicaid Other | Admitting: Women's Health

## 2019-05-23 VITALS — BP 113/73 | HR 101 | Wt 164.5 lb

## 2019-05-23 DIAGNOSIS — Z3482 Encounter for supervision of other normal pregnancy, second trimester: Secondary | ICD-10-CM

## 2019-05-23 DIAGNOSIS — Z3A26 26 weeks gestation of pregnancy: Secondary | ICD-10-CM

## 2019-05-23 DIAGNOSIS — Z23 Encounter for immunization: Secondary | ICD-10-CM

## 2019-05-23 DIAGNOSIS — Z331 Pregnant state, incidental: Secondary | ICD-10-CM

## 2019-05-23 DIAGNOSIS — Z1389 Encounter for screening for other disorder: Secondary | ICD-10-CM

## 2019-05-23 LAB — POCT URINALYSIS DIPSTICK OB
Glucose, UA: NEGATIVE
Ketones, UA: NEGATIVE
Leukocytes, UA: NEGATIVE
Nitrite, UA: NEGATIVE
POC,PROTEIN,UA: NEGATIVE

## 2019-05-23 MED ORDER — PROMETHAZINE HCL 25 MG PO TABS: 25.0000 mg | ORAL_TABLET | Freq: Four times a day (QID) | ORAL | 1 refills | Status: DC | PRN

## 2019-05-23 NOTE — Progress Notes (Signed)
   LOW-RISK PREGNANCY VISIT Patient name: Patricia Richards MRN [id number]  Date of birth: December 04, 1990 Chief Complaint:   Routine Prenatal Visit (PN2 today; + nausea)  History of Present Illness:   Patricia Richards is a 29 y.o. [phone] female at [redacted]w[redacted]d with an Estimated Date of Delivery: 08/23/19 being seen today for ongoing management of a low-risk pregnancy.  Today she reports GERD and nausea. Contractions: Not present. Vag. Bleeding: None.  Movement: Present. denies leaking of fluid. Review of Systems:   Pertinent items are noted in HPI Denies abnormal vaginal discharge w/ itching/odor/irritation, headaches, visual changes, shortness of breath, chest pain, abdominal pain, severe nausea/vomiting, or problems with urination or bowel movements unless otherwise stated above. Pertinent History Reviewed:  Reviewed past medical,surgical, social, obstetrical and family history.  Reviewed problem list, medications and allergies. Physical Assessment:   Vitals:   05/23/19 0905  BP: 113/73  Pulse: (!) 101  Weight: 164 lb 8 oz (74.6 kg)  Body mass index is 29.14 kg/m.        Physical Examination:   General appearance: Well appearing, and in no distress  Mental status: Alert, oriented to person, place, and time  Skin: Warm & dry  Cardiovascular: Normal heart rate noted  Respiratory: Normal respiratory effort, no distress  Abdomen: Soft, gravid, nontender  Pelvic: Cervical exam deferred         Extremities: Edema: None  Fetal Status: Fetal Heart Rate (bpm): 144 Fundal Height: 26 cm Movement: Present    Chaperone: n/a    Results for orders placed or performed in visit on 05/23/19 (from the past 24 hour(s))  POC Urinalysis Dipstick OB   Collection Time: 05/23/19  9:07 AM  Result Value Ref Range   Color, UA     Clarity, UA     Glucose, UA Negative Negative   Bilirubin, UA     Ketones, UA neg    Spec Grav, UA     Blood, UA trace    pH, UA     POC,PROTEIN,UA Negative Negative,  Trace, Small (1+), Moderate (2+), Large (3+), 4+   Urobilinogen, UA     Nitrite, UA neg    Leukocytes, UA Negative Negative   Appearance     Odor      Assessment & Plan:  1) Low-risk pregnancy [id number] at [redacted]w[redacted]d with an Estimated Date of Delivery: 08/23/19   2) GERD w/nausea, on protonix, will give phenergan as zofran is not effective   Meds:  Meds ordered this encounter  Medications  . promethazine (PHENERGAN) 25 MG tablet    Sig: Take 1 tablet (25 mg total) by mouth every 6 (six) hours as needed for nausea or vomiting.    Dispense:  30 tablet    Refill:  1   Labs/procedures today: PN2  Plan:  Continue routine obstetrical care  Next visit: prefers in person    Reviewed: Preterm labor symptoms and general obstetric precautions including but not limited to vaginal bleeding, contractions, leaking of fluid and fetal movement were reviewed in detail with the patient.  All questions were answered. Has home bp cuff. Rx faxed to . Check bp weekly, let us know if >140/90.   Follow-up: Return in about 3 weeks (around 06/13/2019) for [place].  Orders Placed This Encounter  Procedures  . Tdap vaccine greater than or equal to 7yo IM  . POC Urinalysis Dipstick OB   Mertie Clause Naethan Bracewell  05/23/2019 10:12 AM

## 2019-05-24 ENCOUNTER — Other Ambulatory Visit: Payer: Self-pay | Admitting: *Deleted

## 2019-05-24 ENCOUNTER — Encounter: Payer: Self-pay | Admitting: *Deleted

## 2019-05-24 DIAGNOSIS — O0990 Supervision of high risk pregnancy, unspecified, unspecified trimester: Secondary | ICD-10-CM

## 2019-05-24 DIAGNOSIS — O24414 Gestational diabetes mellitus in pregnancy, insulin controlled: Secondary | ICD-10-CM | POA: Insufficient documentation

## 2019-05-24 DIAGNOSIS — O24410 Gestational diabetes mellitus in pregnancy, diet controlled: Secondary | ICD-10-CM

## 2019-05-24 LAB — GLUCOSE TOLERANCE, 2 HOURS W/ 1HR
Glucose, 1 hour: 192 mg/dL — ABNORMAL HIGH (ref 65–179)
Glucose, 2 hour: 122 mg/dL (ref 65–152)
Glucose, Fasting: 88 mg/dL (ref 65–91)

## 2019-05-24 LAB — RPR: RPR Ser Ql: NONREACTIVE

## 2019-05-24 LAB — CBC
Hematocrit: 32.9 % — ABNORMAL LOW (ref 34.0–46.6)
Hemoglobin: 10.4 g/dL — ABNORMAL LOW (ref 11.1–15.9)
MCH: 28.9 pg (ref 26.6–33.0)
MCHC: 31.6 g/dL (ref 31.5–35.7)
MCV: 91 fL (ref 79–97)
Platelets: 247 10*3/uL (ref 150–450)
RBC: 3.6 x10E6/uL — ABNORMAL LOW (ref 3.77–5.28)
RDW: 13.1 % (ref 11.7–15.4)
WBC: 8 10*3/uL (ref 3.4–10.8)

## 2019-05-24 LAB — ANTIBODY SCREEN: Antibody Screen: NEGATIVE

## 2019-05-24 LAB — HIV ANTIBODY (ROUTINE TESTING W REFLEX): HIV Screen 4th Generation wRfx: NONREACTIVE

## 2019-05-24 MED ORDER — ACCU-CHEK GUIDE VI STRP: ORAL_STRIP | 12 refills | Status: DC

## 2019-05-24 MED ORDER — ACCU-CHEK GUIDE ME W/DEVICE KIT: 1.0000 | PACK | Freq: Four times a day (QID) | 0 refills | Status: DC

## 2019-05-24 MED ORDER — ACCU-CHEK SOFTCLIX LANCETS MISC: 12 refills | Status: DC

## 2019-05-31 ENCOUNTER — Other Ambulatory Visit: Payer: Medicaid Other

## 2019-06-08 ENCOUNTER — Encounter: Payer: Self-pay | Admitting: Obstetrics and Gynecology

## 2019-06-08 NOTE — Progress Notes (Signed)
Reviewed data from [employer], patient has been logging CBGs 4x per day. Has elevated CBGs. Will likely need to start on medication, has appointment next week to be seen. MyChart message sent.  Feliz Beam, M.D. Attending Center for [employer] manager)

## 2019-06-13 ENCOUNTER — Encounter: Payer: Medicaid Other | Admitting: Obstetrics and Gynecology

## 2019-06-14 ENCOUNTER — Ambulatory Visit (INDEPENDENT_AMBULATORY_CARE_PROVIDER_SITE_OTHER): Payer: Medicaid Other | Admitting: Family Medicine

## 2019-06-14 ENCOUNTER — Encounter: Payer: Self-pay | Admitting: Family Medicine

## 2019-06-14 ENCOUNTER — Other Ambulatory Visit: Payer: Self-pay

## 2019-06-14 ENCOUNTER — Encounter: Payer: Medicaid Other | Admitting: Advanced Practice Midwife

## 2019-06-14 VITALS — BP 135/81 | HR 105 | Wt 165.2 lb

## 2019-06-14 DIAGNOSIS — Z23 Encounter for immunization: Secondary | ICD-10-CM

## 2019-06-14 DIAGNOSIS — Z87898 Personal history of other specified conditions: Secondary | ICD-10-CM

## 2019-06-14 DIAGNOSIS — O98319 Other infections with a predominantly sexual mode of transmission complicating pregnancy, unspecified trimester: Secondary | ICD-10-CM | POA: Insufficient documentation

## 2019-06-14 DIAGNOSIS — O24414 Gestational diabetes mellitus in pregnancy, insulin controlled: Secondary | ICD-10-CM

## 2019-06-14 DIAGNOSIS — O360930 Maternal care for other rhesus isoimmunization, third trimester, not applicable or unspecified: Secondary | ICD-10-CM

## 2019-06-14 DIAGNOSIS — Z6791 Unspecified blood type, Rh negative: Secondary | ICD-10-CM

## 2019-06-14 DIAGNOSIS — O0990 Supervision of high risk pregnancy, unspecified, unspecified trimester: Secondary | ICD-10-CM

## 2019-06-14 DIAGNOSIS — O26893 Other specified pregnancy related conditions, third trimester: Secondary | ICD-10-CM

## 2019-06-14 DIAGNOSIS — O26899 Other specified pregnancy related conditions, unspecified trimester: Secondary | ICD-10-CM

## 2019-06-14 DIAGNOSIS — Z79899 Other long term (current) drug therapy: Secondary | ICD-10-CM

## 2019-06-14 DIAGNOSIS — Z3A30 30 weeks gestation of pregnancy: Secondary | ICD-10-CM | POA: Diagnosis not present

## 2019-06-14 DIAGNOSIS — A6009 Herpesviral infection of other urogenital tract: Secondary | ICD-10-CM | POA: Insufficient documentation

## 2019-06-14 DIAGNOSIS — O0993 Supervision of high risk pregnancy, unspecified, third trimester: Secondary | ICD-10-CM

## 2019-06-14 DIAGNOSIS — F1991 Other psychoactive substance use, unspecified, in remission: Secondary | ICD-10-CM

## 2019-06-14 DIAGNOSIS — Z331 Pregnant state, incidental: Secondary | ICD-10-CM

## 2019-06-14 LAB — POCT URINALYSIS DIPSTICK OB
Glucose, UA: NEGATIVE
Ketones, UA: NEGATIVE
Nitrite, UA: NEGATIVE
POC,PROTEIN,UA: NEGATIVE

## 2019-06-14 MED ORDER — CLONAZEPAM 1 MG PO TABS: ORAL_TABLET | ORAL | 0 refills | Status: DC

## 2019-06-14 MED ORDER — INSULIN GLARGINE 100 UNIT/ML SOLOSTAR PEN: 10.0000 [IU] | PEN_INJECTOR | Freq: Every day | SUBCUTANEOUS | 11 refills | Status: DC

## 2019-06-14 NOTE — Progress Notes (Signed)
HIGH-RISK PREGNANCY VISIT Patient name: Patricia Richards MRN [id number]  Date of birth: 04-19-1991 Chief Complaint:   High Risk Gestation (legs swelling and hurt; hungry all the time; hurts to have sex )  History of Present Illness:   Patricia Richards is a 29 y.o. [phone] female at [redacted]w[redacted]d with an Estimated Date of Delivery: 08/23/19 being seen today for ongoing management of a high-risk pregnancy complicated by diabetes mellitus A1DM- uncontrolled sugars today.  Today she reports multiple complaints. Contractions: Irregular. Vag. Bleeding: None.  Movement: Present. denies leaking of fluid.  Review of Systems:   Pertinent items are noted in HPI Denies abnormal vaginal discharge w/ itching/odor/irritation, headaches, visual changes, shortness of breath, chest pain, abdominal pain, severe nausea/vomiting, or problems with urination or bowel movements unless otherwise stated above. Pertinent History Reviewed:  Reviewed past medical,surgical, social, obstetrical and family history.  Reviewed problem list, medications and allergies. Physical Assessment:   Vitals:   06/14/19 1554  BP: 135/81  Pulse: (!) 105  Weight: 165 lb 3.2 oz (74.9 kg)  Body mass index is 29.26 kg/m.           Physical Examination:   General appearance: alert, well appearing, and in no distress  Mental status: alert, oriented to person, place, and time  Skin: warm & dry   Extremities: Edema: Trace    Cardiovascular: normal heart rate noted  Respiratory: normal respiratory effort, no distress  Abdomen: gravid, soft, non-tender  Pelvic: Cervical exam deferred         Fetal Status: Fetal Heart Rate (bpm): 145 Fundal Height: 30 cm Movement: Present    Fetal Surveillance Testing today: Needs to start at 32 weeks   Chaperone: n/a    Results for orders placed or performed in visit on 06/14/19 (from the past 24 hour(s))  POC Urinalysis Dipstick OB   Collection Time: 06/14/19  3:55 PM  Result Value Ref Range     Color, UA     Clarity, UA     Glucose, UA Negative Negative   Bilirubin, UA     Ketones, UA neg    Spec Grav, UA     Blood, UA trace    pH, UA     POC,PROTEIN,UA Negative Negative, Trace, Small (1+), Moderate (2+), Large (3+), 4+   Urobilinogen, UA     Nitrite, UA neg    Leukocytes, UA Trace (A) Negative   Appearance     Odor      Assessment & Plan:  High-risk pregnancy [id number] at [redacted]w[redacted]d with an Estimated Date of Delivery: 08/23/19   1. Supervision of high risk pregnancy, antepartum - Up to date - multiple complaints today- hip pain, back pain, about to run out of benzo, sugars, and just "feeling bad" -Recommended belly band for back pain/hip pain. Instructed on using sheet to mimic formal belly band.  - Tdap vaccine greater than or equal to 7yo IM  2.Gestational diabetes mellitus (GDM) requiring insulin Fasting 84-120 (10 of 17); post-prandial 96-142 (9 of 35) Will start insulin due to grossly elevated fastings especially - Will start antenatal testing @32  wks and this was reviewed with client -discussed EFW at 36 wks - Discussed IOL at 39 weeks - Insulin Glargine (LANTUS) 100 UNIT/ML Solostar Pen; Inject 10 Units into the skin at bedtime.  Dispense: 15 mL; Refill: 11  4. History of illicit drug use Discussed with client about long term benzos Reviewed importance of compliance to prescription and consistent prescriber. Given Dr.  Elonda Husky is out of the office I will provide a 1 month supply with strict fill date of 2/2.  - clonazePAM (KLONOPIN) 1 MG tablet; 1 three times daily as needed  Dispense: 90 tablet; Refill: 0  5. Rh negative state in antepartum period - RHO (D) Immune Globulin  6. Need for vaccination - Tdap vaccine greater than or equal to 7yo IM  7. Long term prescription benzodiazepine use - clonazePAM (KLONOPIN) 1 MG tablet; 1 three times daily as needed  Dispense: 90 tablet; Refill: 0    Meds:  Meds ordered this encounter  Medications  . clonazePAM  (KLONOPIN) 1 MG tablet    Sig: 1 three times daily as needed    Dispense:  90 tablet    Refill:  0    Fill on or after 06/19/2019  . Insulin Glargine (LANTUS) 100 UNIT/ML Solostar Pen    Sig: Inject 10 Units into the skin at bedtime.    Dispense:  15 mL    Refill:  11    Labs/procedures today: none  Treatment Plan:  Start insulin, start ante testing at 32 wk  Reviewed: Preterm labor symptoms and general obstetric precautions including but not limited to vaginal bleeding, contractions, leaking of fluid and fetal movement were reviewed in detail with the patient.  All questions were answered.   Follow-up: Return in about 1 week (around 06/21/2019) for Routine prenatal care, in person- new start insulin for GDM.    Future Appointments  Date Time Provider [place]  06/21/2019  3:30 PM Cresenzo-Dishmon, Joaquim Lai, CNM [facility]     Orders Placed This Encounter  Procedures  . Tdap vaccine greater than or equal to 7yo IM  . RHO (D) Immune Globulin  . POC Urinalysis Dipstick OB   Juanita Craver [facility]  06/14/2019 5:37 PM

## 2019-06-18 ENCOUNTER — Telehealth: Payer: Self-pay | Admitting: Obstetrics & Gynecology

## 2019-06-18 DIAGNOSIS — Z79899 Other long term (current) drug therapy: Secondary | ICD-10-CM

## 2019-06-18 DIAGNOSIS — Z87898 Personal history of other specified conditions: Secondary | ICD-10-CM

## 2019-06-18 DIAGNOSIS — F1991 Other psychoactive substance use, unspecified, in remission: Secondary | ICD-10-CM

## 2019-06-18 MED ORDER — CLONAZEPAM 1 MG PO TABS: ORAL_TABLET | ORAL | 0 refills | Status: DC

## 2019-06-21 ENCOUNTER — Other Ambulatory Visit: Payer: Self-pay

## 2019-06-21 ENCOUNTER — Ambulatory Visit (INDEPENDENT_AMBULATORY_CARE_PROVIDER_SITE_OTHER): Payer: Medicaid Other | Admitting: Advanced Practice Midwife

## 2019-06-21 VITALS — BP 124/70 | HR 104 | Wt 164.0 lb

## 2019-06-21 DIAGNOSIS — O24414 Gestational diabetes mellitus in pregnancy, insulin controlled: Secondary | ICD-10-CM

## 2019-06-21 DIAGNOSIS — O0993 Supervision of high risk pregnancy, unspecified, third trimester: Secondary | ICD-10-CM

## 2019-06-21 DIAGNOSIS — O0990 Supervision of high risk pregnancy, unspecified, unspecified trimester: Secondary | ICD-10-CM

## 2019-06-21 DIAGNOSIS — Z3A31 31 weeks gestation of pregnancy: Secondary | ICD-10-CM

## 2019-06-21 DIAGNOSIS — Z1389 Encounter for screening for other disorder: Secondary | ICD-10-CM

## 2019-06-21 DIAGNOSIS — Z331 Pregnant state, incidental: Secondary | ICD-10-CM

## 2019-06-21 NOTE — Progress Notes (Signed)
   HIGH-RISK PREGNANCY VISIT Patient name: Patricia Richards MRN [id number]  Date of birth: 30-Jun-1990 Chief Complaint:   Routine Prenatal Visit  History of Present Illness:   Patricia Richards Patricia Richards is a 29 y.o. [phone] female at [redacted]w[redacted]d with an Estimated Date of Delivery: 08/23/19 being seen today for ongoing management of a high-risk pregnancy complicated by [id number], started on Lantus last week. Today she reports FBS mostly 96 and 2 hr pp 1/2 are normal. . Contractions: Irritability. Vag. Bleeding: None.  Movement: Present. denies leaking of fluid.  Review of Systems:   Pertinent items are noted in HPI Denies abnormal vaginal discharge w/ itching/odor/irritation, headaches, visual changes, shortness of breath, chest pain, abdominal pain, severe nausea/vomiting, or problems with urination or bowel movements unless otherwise stated above. Pertinent History Reviewed:  Reviewed past medical,surgical, social, obstetrical and family history.  Reviewed problem list, medications and allergies. Physical Assessment:   Vitals:   06/21/19 1545  BP: 124/70  Pulse: (!) 104  Weight: 164 lb (74.4 kg)  Body mass index is 29.05 kg/m.           Physical Examination:   General appearance: alert, well appearing, and in no distress  Mental status: alert, oriented to person, place, and time  Skin: warm & dry   Extremities: Edema: Trace    Cardiovascular: normal heart rate noted  Respiratory: normal respiratory effort, no distress  Abdomen: gravid, soft, non-tender  Pelvic: Cervical exam deferred         Fetal Status:     Movement: Present    Fetal Surveillance Testing today: doppler   No results found for this or any previous visit (from the past 24 hour(s)).  Assessment & Plan:  1) High-risk pregnancy [id number] at [redacted]w[redacted]d with an Estimated Date of Delivery: 08/23/19   2) A2DM, unstable Treatment plan  Increase Lantus to 20 qhs  Meds: No orders of the defined types were placed in this  encounter.    Reviewed: Preterm labor symptoms and general obstetric precautions including but not limited to vaginal bleeding, contractions, leaking of fluid and fetal movement were reviewed in detail with the patient.  All questions were answered. Has home bp cuff. Check bp weekly, let us know if >140/90.   Follow-up: Return for weekly BPP/HROB Thursdays(If can't do [place], then schedule for NST Mon-Thurs that week), sign BTL form.  No future appointments.  Orders Placed This Encounter  Procedures  . POC Urinalysis Dipstick OB   Christin Fudge DNP, CNM 06/21/2019 4:00 PM

## 2019-06-25 ENCOUNTER — Ambulatory Visit (INDEPENDENT_AMBULATORY_CARE_PROVIDER_SITE_OTHER): Payer: Medicaid Other | Admitting: Obstetrics & Gynecology

## 2019-06-25 ENCOUNTER — Encounter: Payer: Self-pay | Admitting: Obstetrics & Gynecology

## 2019-06-25 ENCOUNTER — Other Ambulatory Visit: Payer: Self-pay

## 2019-06-25 VITALS — BP 111/69 | HR 98 | Wt 166.0 lb

## 2019-06-25 DIAGNOSIS — Z3A31 31 weeks gestation of pregnancy: Secondary | ICD-10-CM

## 2019-06-25 DIAGNOSIS — O24414 Gestational diabetes mellitus in pregnancy, insulin controlled: Secondary | ICD-10-CM

## 2019-06-25 DIAGNOSIS — O0993 Supervision of high risk pregnancy, unspecified, third trimester: Secondary | ICD-10-CM

## 2019-06-25 NOTE — Progress Notes (Signed)
.     HIGH-RISK PREGNANCY VISIT Patient name: Patricia Richards MRN [id number]  Date of birth: 1991/01/17 Chief Complaint:   Routine Prenatal Visit (NST)  History of Present Illness:   Patricia Richards is a 29 y.o. [phone] female at [redacted]w[redacted]d with an Estimated Date of Delivery: 08/23/19 being seen today for ongoing management of a high-risk pregnancy complicated by Class A2 DM.  Today she reports right hip pain. Contractions: Irritability. Vag. Bleeding: None.  Movement: Present. denies leaking of fluid.  Review of Systems:   Pertinent items are noted in HPI Denies abnormal vaginal discharge w/ itching/odor/irritation, headaches, visual changes, shortness of breath, chest pain, abdominal pain, severe nausea/vomiting, or problems with urination or bowel movements unless otherwise stated above. Pertinent History Reviewed:  Reviewed past medical,surgical, social, obstetrical and family history.  Reviewed problem list, medications and allergies. Physical Assessment:   Vitals:   06/25/19 1501  BP: 111/69  Pulse: 98  Weight: 166 lb (75.3 kg)  Body mass index is 29.41 kg/m.           Physical Examination:   General appearance: alert, well appearing, and in no distress  Mental status: alert, oriented to person, place, and time  Skin: warm & dry   Extremities: Edema: Trace    Cardiovascular: normal heart rate noted  Respiratory: normal respiratory effort, no distress  Abdomen: gravid, soft, non-tender  Pelvic: Cervical exam deferred         Fetal Status: Fetal Heart Rate (bpm): 135 Fundal Height: 33 cm Movement: Present    Fetal Surveillance Testing today: Reactive NST Patricia Richards is at [redacted]w[redacted]d Estimated Date of Delivery: 08/23/19  NST being performed due to Class A2 DM  Today the NST is Reactive  Fetal Monitoring:  Baseline: 135 bpm, Variability: Good {> 6 bpm), Accelerations: Reactive and Decelerations: Absent   reactive  The accelerations are >15 bpm and more than 2 in  20 minutes  Final diagnosis:  Reactive NST  Florian Buff, MD      Chaperone: n/a    No results found for this or any previous visit (from the past 24 hour(s)).  Assessment & Plan:  1) High-risk pregnancy [id number] at [redacted]w[redacted]d with an Estimated Date of Delivery: 08/23/19   2) Class A2 DM, stable, lantus 20 units at bedtime, CBG look good  3) Klonopin long term use,   Meds: No orders of the defined types were placed in this encounter.   Labs/procedures today: reactive NST  Treatment Plan:  Twice weekly NST in general but EFW 32 weeks and 36 weeks w/BPP at that visit as well.  Reviewed: Preterm labor symptoms and general obstetric precautions including but not limited to vaginal bleeding, contractions, leaking of fluid and fetal movement were reviewed in detail with the patient.  All questions were answered. Has  home bp cuff. Rx faxed to . Check bp weekly, let us know if >140/90.   Follow-up: Return in about 3 days (around 06/28/2019) for NST, HROB.  No orders of the defined types were placed in this encounter.  Florian Buff  06/25/2019 3:47 PM

## 2019-06-28 ENCOUNTER — Other Ambulatory Visit: Payer: Self-pay

## 2019-06-28 ENCOUNTER — Ambulatory Visit (INDEPENDENT_AMBULATORY_CARE_PROVIDER_SITE_OTHER): Payer: Medicaid Other | Admitting: *Deleted

## 2019-06-28 VITALS — BP 104/63 | HR 86

## 2019-06-28 DIAGNOSIS — Z331 Pregnant state, incidental: Secondary | ICD-10-CM

## 2019-06-28 DIAGNOSIS — Z1389 Encounter for screening for other disorder: Secondary | ICD-10-CM

## 2019-06-28 DIAGNOSIS — Z3A32 32 weeks gestation of pregnancy: Secondary | ICD-10-CM | POA: Diagnosis not present

## 2019-06-28 DIAGNOSIS — O24414 Gestational diabetes mellitus in pregnancy, insulin controlled: Secondary | ICD-10-CM | POA: Diagnosis not present

## 2019-06-28 DIAGNOSIS — O0990 Supervision of high risk pregnancy, unspecified, unspecified trimester: Secondary | ICD-10-CM

## 2019-06-28 LAB — POCT URINALYSIS DIPSTICK OB
Blood, UA: NEGATIVE
Glucose, UA: NEGATIVE
Ketones, UA: NEGATIVE
Leukocytes, UA: NEGATIVE
Nitrite, UA: NEGATIVE
POC,PROTEIN,UA: NEGATIVE

## 2019-06-28 NOTE — Progress Notes (Signed)
   NURSE VISIT- NST  SUBJECTIVE:  Patricia Richards is a 29 y.o. [phone] female at [redacted]w[redacted]d, here for a NST for pregnancy complicated by [id number] currently on insulin.  She reports active fetal movement, contractions: none, vaginal bleeding: none, membranes: intact.   OBJECTIVE:  BP 104/63   Pulse 86   LMP 11/08/2018   Appears well, no apparent distress  Results for orders placed or performed in visit on 06/28/19 (from the past 24 hour(s))  POC Urinalysis Dipstick OB   Collection Time: 06/28/19  3:40 PM  Result Value Ref Range   Color, UA     Clarity, UA     Glucose, UA Negative Negative   Bilirubin, UA     Ketones, UA neg    Spec Grav, UA     Blood, UA neg    pH, UA     POC,PROTEIN,UA Negative Negative, Trace, Small (1+), Moderate (2+), Large (3+), 4+   Urobilinogen, UA     Nitrite, UA neg    Leukocytes, UA Negative Negative   Appearance     Odor      NST: FHR baseline 140 bpm, Variability: moderate, Accelerations:present, Decelerations:  Absent= Cat 1 /Reactive Toco: none   ASSESSMENT: [id number] at [redacted]w[redacted]d with A2DM currently on lantus NST reactive  PLAN: EFM strip reviewed by [name], CNM   Recommendations: keep next appointment as scheduled    Alice Rieger  06/28/2019 3:53 PM

## 2019-06-29 ENCOUNTER — Other Ambulatory Visit: Payer: Self-pay | Admitting: Advanced Practice Midwife

## 2019-06-29 DIAGNOSIS — O24419 Gestational diabetes mellitus in pregnancy, unspecified control: Secondary | ICD-10-CM

## 2019-07-02 ENCOUNTER — Other Ambulatory Visit: Payer: Self-pay

## 2019-07-02 ENCOUNTER — Encounter: Payer: Self-pay | Admitting: Obstetrics & Gynecology

## 2019-07-02 ENCOUNTER — Ambulatory Visit (INDEPENDENT_AMBULATORY_CARE_PROVIDER_SITE_OTHER): Payer: Medicaid Other | Admitting: Obstetrics & Gynecology

## 2019-07-02 ENCOUNTER — Ambulatory Visit (INDEPENDENT_AMBULATORY_CARE_PROVIDER_SITE_OTHER): Payer: Medicaid Other

## 2019-07-02 VITALS — BP 107/69 | HR 93 | Wt 168.0 lb

## 2019-07-02 DIAGNOSIS — O0990 Supervision of high risk pregnancy, unspecified, unspecified trimester: Secondary | ICD-10-CM

## 2019-07-02 DIAGNOSIS — Z3A32 32 weeks gestation of pregnancy: Secondary | ICD-10-CM

## 2019-07-02 DIAGNOSIS — O98319 Other infections with a predominantly sexual mode of transmission complicating pregnancy, unspecified trimester: Secondary | ICD-10-CM

## 2019-07-02 DIAGNOSIS — O0992 Supervision of high risk pregnancy, unspecified, second trimester: Secondary | ICD-10-CM

## 2019-07-02 DIAGNOSIS — O24419 Gestational diabetes mellitus in pregnancy, unspecified control: Secondary | ICD-10-CM | POA: Diagnosis not present

## 2019-07-02 DIAGNOSIS — Z362 Encounter for other antenatal screening follow-up: Secondary | ICD-10-CM

## 2019-07-02 DIAGNOSIS — O24414 Gestational diabetes mellitus in pregnancy, insulin controlled: Secondary | ICD-10-CM

## 2019-07-02 DIAGNOSIS — A6009 Herpesviral infection of other urogenital tract: Secondary | ICD-10-CM

## 2019-07-02 NOTE — Progress Notes (Signed)
[place] [id number] wks,cephalic,anterior placenta gr 1,afi 14 cm,fhr 154 bpm,BPP 8/8,EFW 2600 g 97%

## 2019-07-02 NOTE — Progress Notes (Signed)
   HIGH-RISK PREGNANCY VISIT Patient name: Patricia Richards MRN [id number]  Date of birth: 06/12/1990 Chief Complaint:   Routine Prenatal Visit  History of Present Illness:   Patricia Richards is a 29 y.o. [phone] female at [redacted]w[redacted]d with an Estimated Date of Delivery: 08/23/19 being seen today for ongoing management of a high-risk pregnancy complicated by diabetes mellitus A2DM currently on lantus 20 units.  Today she reports no complaints. Contractions: Not present. Vag. Bleeding: None.  Movement: Present. denies leaking of fluid.  Review of Systems:   Pertinent items are noted in HPI Denies abnormal vaginal discharge w/ itching/odor/irritation, headaches, visual changes, shortness of breath, chest pain, abdominal pain, severe nausea/vomiting, or problems with urination or bowel movements unless otherwise stated above. Pertinent History Reviewed:  Reviewed past medical,surgical, social, obstetrical and family history.  Reviewed problem list, medications and allergies. Physical Assessment:   Vitals:   07/02/19 1520  BP: 107/69  Pulse: 93  Weight: 168 lb (76.2 kg)  Body mass index is 29.76 kg/m.           Physical Examination:   General appearance: alert, well appearing, and in no distress  Mental status: alert, oriented to person, place, and time  Skin: warm & dry   Extremities: Edema: Trace    Cardiovascular: normal heart rate noted  Respiratory: normal respiratory effort, no distress  Abdomen: gravid, soft, non-tender  Pelvic: Cervical exam deferred         Fetal Status: Fetal Heart Rate (bpm): [place]   Movement: Present    Fetal Surveillance Testing today: BPP 8/8   Chaperone: n/a    No results found for this or any previous visit (from the past 24 hour(s)).  Assessment & Plan:  1) High-risk pregnancy [id number] at [redacted]w[redacted]d with an Estimated Date of Delivery: 08/23/19   2) Class A2 DM, stable, goes to sleep after meals and `misses some blood sugars    Meds: No orders of the  defined types were placed in this encounter.   Labs/procedures today:   Treatment Plan:  restart twice weekly NSTs next week  Reviewed: Preterm labor symptoms and general obstetric precautions including but not limited to vaginal bleeding, contractions, leaking of fluid and fetal movement were reviewed in detail with the patient.  All questions were answered. Has home bp cuff. Rx faxed to . Check bp weekly, let us know if >140/90.   Follow-up: No follow-ups on file.  No orders of the defined types were placed in this encounter.  Florian Buff  07/02/2019 3:43 PM

## 2019-07-05 ENCOUNTER — Other Ambulatory Visit: Payer: Medicaid Other

## 2019-07-09 ENCOUNTER — Encounter: Payer: Medicaid Other | Admitting: Advanced Practice Midwife

## 2019-07-09 ENCOUNTER — Other Ambulatory Visit: Payer: Self-pay

## 2019-07-09 ENCOUNTER — Encounter: Payer: Self-pay | Admitting: Obstetrics and Gynecology

## 2019-07-09 ENCOUNTER — Other Ambulatory Visit: Payer: Medicaid Other

## 2019-07-09 ENCOUNTER — Ambulatory Visit (INDEPENDENT_AMBULATORY_CARE_PROVIDER_SITE_OTHER): Payer: Medicaid Other | Admitting: Obstetrics and Gynecology

## 2019-07-09 VITALS — BP 112/64 | HR 91 | Wt 164.0 lb

## 2019-07-09 DIAGNOSIS — N898 Other specified noninflammatory disorders of vagina: Secondary | ICD-10-CM

## 2019-07-09 DIAGNOSIS — Z1389 Encounter for screening for other disorder: Secondary | ICD-10-CM | POA: Diagnosis not present

## 2019-07-09 DIAGNOSIS — O0990 Supervision of high risk pregnancy, unspecified, unspecified trimester: Secondary | ICD-10-CM

## 2019-07-09 DIAGNOSIS — O98513 Other viral diseases complicating pregnancy, third trimester: Secondary | ICD-10-CM

## 2019-07-09 DIAGNOSIS — Z331 Pregnant state, incidental: Secondary | ICD-10-CM

## 2019-07-09 DIAGNOSIS — O24414 Gestational diabetes mellitus in pregnancy, insulin controlled: Secondary | ICD-10-CM

## 2019-07-09 DIAGNOSIS — O98319 Other infections with a predominantly sexual mode of transmission complicating pregnancy, unspecified trimester: Secondary | ICD-10-CM

## 2019-07-09 DIAGNOSIS — Z3A33 33 weeks gestation of pregnancy: Secondary | ICD-10-CM

## 2019-07-09 DIAGNOSIS — O26893 Other specified pregnancy related conditions, third trimester: Secondary | ICD-10-CM

## 2019-07-09 DIAGNOSIS — A6009 Herpesviral infection of other urogenital tract: Secondary | ICD-10-CM

## 2019-07-09 LAB — POCT URINALYSIS DIPSTICK OB
Glucose, UA: NEGATIVE
Ketones, UA: NEGATIVE
Nitrite, UA: NEGATIVE

## 2019-07-09 LAB — POCT WET PREP (WET MOUNT): Trichomonas Wet Prep HPF POC: ABSENT

## 2019-07-09 NOTE — Progress Notes (Addendum)
Patient ID: Patricia Richards, female   DOB: [birth year], 29 y.o.   MRN: [id number]    [facility] PREGNANCY VISIT Patient name: Patricia Richards MRN [id number]  Date of birth: [birth year] Chief Complaint:   High Risk Gestation (NST; + regular contractions last pm )  History of Present Illness:   Patricia Richards is a 29 y.o. [phone] female at [redacted]w[redacted]d with an Estimated Date of Delivery: 08/23/19 being seen today for ongoing management of a high-risk pregnancy complicated by diabetes mellitus A2DM currently on Lantus 20 units at night, fastings normally 96-103,Post prandials have all been normal except 1 which was 154. She is unable to breast feed due to klonopin. She has signed tubal papers but now doesn't want her tubes tied. She would like to try paraguard IUD.  She has custody of only 3 of her kids. She has one that is living with the grandmother and one other child living with her father. She has plans to keep this baby. She had gotten pregnant on nuvaring, Depo injection was unsuccessful. She was told to put in nuvaring 2 weeks after her last delivery and after she removed it, she had blot clots to come out along with it.    Today she reports regular contractions since last night. Contractions: Irregular. Vag. Bleeding: None.  Movement: Present. denies leaking of fluid.  Review of Systems:   Pertinent items are noted in HPI Denies abnormal vaginal discharge w/ itching/odor/irritation, headaches, visual changes, shortness of breath, chest pain, abdominal pain, severe nausea/vomiting, or problems with urination or bowel movements unless otherwise stated above. Pertinent History Reviewed:  Reviewed past medical,surgical, social, obstetrical and family history.  Reviewed problem list, medications and allergies. Physical Assessment:   Vitals:   07/09/19 1544  BP: 112/64  Pulse: 91  Weight: 164 lb (74.4 kg)  Body mass index is 29.05 kg/m.           Physical Examination:   General  appearance: alert, well appearing, and in no distress and oriented to person, place, and time  Mental status: normal mood, behavior, speech, dress, motor activity, and thought processes, affect appropriate to mood  Skin: warm & dry   Extremities: Edema: None    Cardiovascular: normal heart rate noted  Respiratory: normal respiratory effort, no distress  Abdomen: gravid, soft, non-tender  Pelvic: Cervical exam performed BV testing,   Wet prep: Normal epithelial        Fetal Status:     Movement: Present    Fetal Surveillance Testing today: NST   Chaperone: Clerance Lav    Results for orders placed or performed in visit on 07/09/19 (from the past 24 hour(s))  POC Urinalysis Dipstick OB   Collection Time: 07/09/19  3:45 PM  Result Value Ref Range   Color, UA     Clarity, UA     Glucose, UA Negative Negative   Bilirubin, UA     Ketones, UA neg    Spec Grav, UA     Blood, UA trace    pH, UA     POC,PROTEIN,UA Trace Negative, Trace, Small (1+), Moderate (2+), Large (3+), 4+   Urobilinogen, UA     Nitrite, UA neg    Leukocytes, UA Trace (A) Negative   Appearance     Odor      Assessment & Plan:  1) High-risk pregnancy [id number] at [redacted]w[redacted]d with an Estimated Date of Delivery: 08/23/19   2) A2DM, stable  3) contraception management  Meds: No  orders of the defined types were placed in this encounter.  Labs/procedures today: None  Treatment Plan:   Growth u/s at 36 weeks Paraguard after delivery  Reviewed: Preterm labor symptoms and general obstetric precautions including but not limited to vaginal bleeding, contractions, leaking of fluid and fetal movement were reviewed in detail with the patient.  All questions were answered.   Follow-up: Return in about 3 days (around 07/12/2019) for BPP.  Orders Placed This Encounter  Procedures  . POC Urinalysis Dipstick OB   By signing my name below, I, Clerance Lav, attest that this documentation has been prepared under the direction  and in the presence of Jonnie Kind, MD Electronically Signed: [place]. 07/09/19. 4:20 PM.  I personally performed the services described in this documentation, which was SCRIBED in my presence. The recorded information has been reviewed and considered accurate. It has been edited as necessary during review. Jonnie Kind, MD

## 2019-07-12 ENCOUNTER — Other Ambulatory Visit: Payer: Medicaid Other

## 2019-07-16 ENCOUNTER — Ambulatory Visit (INDEPENDENT_AMBULATORY_CARE_PROVIDER_SITE_OTHER): Payer: Medicaid Other | Admitting: Obstetrics & Gynecology

## 2019-07-16 ENCOUNTER — Encounter: Payer: Self-pay | Admitting: Obstetrics & Gynecology

## 2019-07-16 ENCOUNTER — Other Ambulatory Visit: Payer: Self-pay

## 2019-07-16 ENCOUNTER — Other Ambulatory Visit: Payer: Medicaid Other

## 2019-07-16 ENCOUNTER — Other Ambulatory Visit: Payer: Medicaid Other | Admitting: Obstetrics & Gynecology

## 2019-07-16 ENCOUNTER — Encounter: Payer: Medicaid Other | Admitting: Women's Health

## 2019-07-16 VITALS — BP 114/71 | HR 91 | Wt 166.0 lb

## 2019-07-16 DIAGNOSIS — O98319 Other infections with a predominantly sexual mode of transmission complicating pregnancy, unspecified trimester: Secondary | ICD-10-CM

## 2019-07-16 DIAGNOSIS — Z3A34 34 weeks gestation of pregnancy: Secondary | ICD-10-CM

## 2019-07-16 DIAGNOSIS — O0990 Supervision of high risk pregnancy, unspecified, unspecified trimester: Secondary | ICD-10-CM

## 2019-07-16 DIAGNOSIS — Z794 Long term (current) use of insulin: Secondary | ICD-10-CM

## 2019-07-16 DIAGNOSIS — A6009 Herpesviral infection of other urogenital tract: Secondary | ICD-10-CM | POA: Diagnosis not present

## 2019-07-16 DIAGNOSIS — O24414 Gestational diabetes mellitus in pregnancy, insulin controlled: Secondary | ICD-10-CM

## 2019-07-16 NOTE — Progress Notes (Signed)
   HIGH-RISK PREGNANCY VISIT Patient name: Patricia Richards MRN [id number]  Date of birth: [birth year] Chief Complaint:   Routine Prenatal Visit (NST)  History of Present Illness:   Patricia Richards is a 29 y.o. [phone] female at [redacted]w[redacted]d with an Estimated Date of Delivery: 08/23/19 being seen today for ongoing management of a high-risk pregnancy complicated by diabetes mellitus A2DM currently on Lantus 20 at bedtime.  Today she reports no complaints. Contractions: Irregular. Vag. Bleeding: None.  Movement: Present. denies leaking of fluid.  Review of Systems:   Pertinent items are noted in HPI Denies abnormal vaginal discharge w/ itching/odor/irritation, headaches, visual changes, shortness of breath, chest pain, abdominal pain, severe nausea/vomiting, or problems with urination or bowel movements unless otherwise stated above. Pertinent History Reviewed:  Reviewed past medical,surgical, social, obstetrical and family history.  Reviewed problem list, medications and allergies. Physical Assessment:   Vitals:   07/16/19 1535  BP: 114/71  Pulse: 91  Weight: 166 lb (75.3 kg)  Body mass index is 29.41 kg/m.           Physical Examination:   General appearance: alert, well appearing, and in no distress  Mental status: alert, oriented to person, place, and time  Skin: warm & dry   Extremities: Edema: None    Cardiovascular: normal heart rate noted  Respiratory: normal respiratory effort, no distress  Abdomen: gravid, soft, non-tender  Pelvic: Cervical exam deferred         Fetal Status: Fetal Heart Rate (bpm): 135 Fundal Height: 36 cm Movement: Present    Fetal Surveillance Testing today: Reactive NST  Patricia Richards is at [redacted]w[redacted]d Estimated Date of Delivery: 08/23/19  NST being performed due to Class A2 DM  Today the NST is Reactive  Fetal Monitoring:  Baseline: 135 bpm, Variability: Good {> 6 bpm), Accelerations: Reactive and Decelerations: Absent   reactive  The  accelerations are >15 bpm and more than 2 in 20 minutes  Final diagnosis:  Reactive NST  Florian Buff, MD      Chaperone: Estill Bamberg Rash    No results found for this or any previous visit (from the past 24 hour(s)).  Assessment & Plan:  1) High-risk pregnancy [id number] at [redacted]w[redacted]d with an Estimated Date of Delivery: 08/23/19   2) Class A2 DM, stable  3) Chronic klonopin use, stable  Meds: No orders of the defined types were placed in this encounter.   Labs/procedures today:   Treatment Plan:  Per protocol for A2 DM  Reviewed: Preterm labor symptoms and general obstetric precautions including but not limited to vaginal bleeding, contractions, leaking of fluid and fetal movement were reviewed in detail with the patient.  All questions were answered. Has home bp cuff. Rx faxed to . Check bp weekly, let us know if >140/90.   Follow-up: Return in about 3 days (around 07/19/2019) for NST, HROB.  No orders of the defined types were placed in this encounter.  Florian Buff  07/16/2019 4:35 PM

## 2019-07-18 ENCOUNTER — Other Ambulatory Visit: Payer: Self-pay | Admitting: Obstetrics & Gynecology

## 2019-07-18 DIAGNOSIS — Z87898 Personal history of other specified conditions: Secondary | ICD-10-CM

## 2019-07-18 DIAGNOSIS — Z79899 Other long term (current) drug therapy: Secondary | ICD-10-CM

## 2019-07-18 DIAGNOSIS — F1991 Other psychoactive substance use, unspecified, in remission: Secondary | ICD-10-CM

## 2019-07-19 ENCOUNTER — Other Ambulatory Visit: Payer: Self-pay | Admitting: Obstetrics & Gynecology

## 2019-07-19 ENCOUNTER — Other Ambulatory Visit: Payer: Medicaid Other

## 2019-07-19 ENCOUNTER — Encounter: Payer: Self-pay | Admitting: *Deleted

## 2019-07-19 DIAGNOSIS — Z87898 Personal history of other specified conditions: Secondary | ICD-10-CM

## 2019-07-19 DIAGNOSIS — Z79899 Other long term (current) drug therapy: Secondary | ICD-10-CM

## 2019-07-19 DIAGNOSIS — F1991 Other psychoactive substance use, unspecified, in remission: Secondary | ICD-10-CM

## 2019-07-19 MED ORDER — PANTOPRAZOLE SODIUM 20 MG PO TBEC: 20.0000 mg | DELAYED_RELEASE_TABLET | Freq: Every day | ORAL | 6 refills | Status: DC

## 2019-07-19 MED ORDER — CLONAZEPAM 1 MG PO TABS: ORAL_TABLET | ORAL | 2 refills | Status: DC

## 2019-07-19 NOTE — Telephone Encounter (Signed)
Estill Bamberg I refilled this prescription on 06/18/19

## 2019-07-20 ENCOUNTER — Other Ambulatory Visit: Payer: Self-pay | Admitting: Obstetrics & Gynecology

## 2019-07-20 ENCOUNTER — Ambulatory Visit (INDEPENDENT_AMBULATORY_CARE_PROVIDER_SITE_OTHER): Payer: Medicaid Other | Admitting: *Deleted

## 2019-07-20 ENCOUNTER — Other Ambulatory Visit: Payer: Self-pay

## 2019-07-20 DIAGNOSIS — Z3A35 35 weeks gestation of pregnancy: Secondary | ICD-10-CM | POA: Diagnosis not present

## 2019-07-20 DIAGNOSIS — O24414 Gestational diabetes mellitus in pregnancy, insulin controlled: Secondary | ICD-10-CM | POA: Diagnosis not present

## 2019-07-20 DIAGNOSIS — O98319 Other infections with a predominantly sexual mode of transmission complicating pregnancy, unspecified trimester: Secondary | ICD-10-CM

## 2019-07-20 DIAGNOSIS — O24419 Gestational diabetes mellitus in pregnancy, unspecified control: Secondary | ICD-10-CM

## 2019-07-20 DIAGNOSIS — A6009 Herpesviral infection of other urogenital tract: Secondary | ICD-10-CM

## 2019-07-20 DIAGNOSIS — Z1389 Encounter for screening for other disorder: Secondary | ICD-10-CM

## 2019-07-20 DIAGNOSIS — O0990 Supervision of high risk pregnancy, unspecified, unspecified trimester: Secondary | ICD-10-CM

## 2019-07-20 DIAGNOSIS — Z331 Pregnant state, incidental: Secondary | ICD-10-CM

## 2019-07-20 LAB — POCT URINALYSIS DIPSTICK OB
Blood, UA: NEGATIVE
Glucose, UA: NEGATIVE
Ketones, UA: NEGATIVE
Leukocytes, UA: NEGATIVE
Nitrite, UA: NEGATIVE
POC,PROTEIN,UA: NEGATIVE

## 2019-07-20 NOTE — Progress Notes (Addendum)
   NURSE VISIT- NST  SUBJECTIVE:  Patricia Richards is a 29 y.o. [phone] female at [redacted]w[redacted]d, here for a NST for pregnancy complicated by [id number] currently on lantus.  She reports active fetal movement, contractions: none, vaginal bleeding: none, membranes: intact.   OBJECTIVE:  BP 119/67   Pulse (!) 112   Wt 167 lb 3.2 oz (75.8 kg)   LMP 11/08/2018   BMI 29.62 kg/m   Appears well, no apparent distress  Results for orders placed or performed in visit on 07/20/19 (from the past 24 hour(s))  POC Urinalysis Dipstick OB   Collection Time: 07/20/19 11:25 AM  Result Value Ref Range   Color, UA     Clarity, UA     Glucose, UA Negative Negative   Bilirubin, UA     Ketones, UA neg    Spec Grav, UA     Blood, UA neg    pH, UA     POC,PROTEIN,UA Negative Negative, Trace, Small (1+), Moderate (2+), Large (3+), 4+   Urobilinogen, UA     Nitrite, UA neg    Leukocytes, UA Negative Negative   Appearance     Odor      NST: FHR baseline 140 bpm, Variability: moderate, Accelerations:present, Decelerations:  Absent= Cat 1/Reactive Toco: occasional  ASSESSMENT: [id number] at [redacted]w[redacted]d with A2DM currently on lantus NST reactive  PLAN: EFM strip reviewed by Dr. Elonda Husky   Recommendations: keep next appointment as scheduled    Alice Rieger  07/20/2019 12:03 PM   Attestation of Attending Supervision of Advanced Practitioner (CNM/NP/PA): Evaluation and management procedures were performed by the Advanced Practitioner under my supervision and collaboration. I have reviewed the Advanced Practitioner's note and chart, and I agree with the management and plan.  Jacelyn Grip MD Attending Physician for the [facility] for [facility] 10/30/2019 6:54 AM

## 2019-07-23 ENCOUNTER — Ambulatory Visit (INDEPENDENT_AMBULATORY_CARE_PROVIDER_SITE_OTHER): Payer: Medicaid Other

## 2019-07-23 ENCOUNTER — Encounter: Payer: Medicaid Other | Admitting: Obstetrics and Gynecology

## 2019-07-23 ENCOUNTER — Other Ambulatory Visit: Payer: Self-pay

## 2019-07-23 ENCOUNTER — Other Ambulatory Visit: Payer: Medicaid Other

## 2019-07-23 ENCOUNTER — Ambulatory Visit (INDEPENDENT_AMBULATORY_CARE_PROVIDER_SITE_OTHER): Payer: Medicaid Other | Admitting: Advanced Practice Midwife

## 2019-07-23 VITALS — BP 113/67 | HR 102 | Wt 164.0 lb

## 2019-07-23 DIAGNOSIS — O98319 Other infections with a predominantly sexual mode of transmission complicating pregnancy, unspecified trimester: Secondary | ICD-10-CM

## 2019-07-23 DIAGNOSIS — Z3A35 35 weeks gestation of pregnancy: Secondary | ICD-10-CM

## 2019-07-23 DIAGNOSIS — A6009 Herpesviral infection of other urogenital tract: Secondary | ICD-10-CM

## 2019-07-23 DIAGNOSIS — O24414 Gestational diabetes mellitus in pregnancy, insulin controlled: Secondary | ICD-10-CM

## 2019-07-23 DIAGNOSIS — Z1389 Encounter for screening for other disorder: Secondary | ICD-10-CM

## 2019-07-23 DIAGNOSIS — O0990 Supervision of high risk pregnancy, unspecified, unspecified trimester: Secondary | ICD-10-CM

## 2019-07-23 DIAGNOSIS — O24419 Gestational diabetes mellitus in pregnancy, unspecified control: Secondary | ICD-10-CM | POA: Diagnosis not present

## 2019-07-23 DIAGNOSIS — O0993 Supervision of high risk pregnancy, unspecified, third trimester: Secondary | ICD-10-CM

## 2019-07-23 DIAGNOSIS — Z331 Pregnant state, incidental: Secondary | ICD-10-CM

## 2019-07-23 LAB — POCT URINALYSIS DIPSTICK OB
Blood, UA: NEGATIVE
Glucose, UA: NEGATIVE
Ketones, UA: NEGATIVE
Leukocytes, UA: NEGATIVE
Nitrite, UA: NEGATIVE
POC,PROTEIN,UA: NEGATIVE

## 2019-07-23 NOTE — Patient Instructions (Signed)
Patricia Richards, I greatly value your feedback.  If you receive a survey following your visit with [place] today, we appreciate you taking the time to fill it out.  Thanks, Patricia Berthold, DNP, CNM  [facility]!!! It is now [place] at [facility] ([place], [place] [address]) Entrance located off of [address] parking   Go to [email or web address] to register for FREE online childbirth classes    Call the office [phone]) or go to [place] if:  You begin to have strong, frequent contractions  Your water breaks.  Sometimes it is a big gush of fluid, sometimes it is just a trickle that keeps getting your panties wet or running down your legs  You have vaginal bleeding.  It is normal to have a small amount of spotting if your cervix was checked.   You don't feel your baby moving like normal.  If you don't, get you something to eat and drink and lay down and focus on feeling your baby move.  You should feel at least 10 movements in 2 hours.  If you don't, you should call the office or go to [place] Blood Pressure Monitoring for Patients   Your provider has recommended that you check your blood pressure (BP) at least once a week at home. If you do not have a blood pressure cuff at home, one will be provided for you. Contact your provider if you have not received your monitor within 1 week.   Helpful Tips for Accurate Home Blood Pressure Checks  . Don't smoke, exercise, or drink caffeine 30 minutes before checking your BP . Use the restroom before checking your BP (a full bladder can raise your pressure) . Relax in a comfortable upright chair . Feet on the ground . Left arm resting comfortably on a flat surface at the level of your heart . Legs uncrossed . Back supported . Sit quietly and don't talk . Place the cuff on your bare arm . Adjust snuggly, so that only two fingertips can  fit between your skin and the top of the cuff . Check 2 readings separated by at least one minute . Keep a log of your BP readings . For a visual, please reference this diagram: http://ccnc.care/bpdiagram  Provider Name: [facility]     Phone: [phone]  Zone 1: ALL CLEAR  Continue to monitor your symptoms:  . BP reading is less than 140 (top number) or less than 90 (bottom number)  . No right upper stomach pain . No headaches or seeing spots . No feeling nauseated or throwing up . No swelling in face and hands  Zone 2: CAUTION Call your doctor's office for any of the following:  . BP reading is greater than 140 (top number) or greater than 90 (bottom number)  . Stomach pain under your ribs in the middle or right side . Headaches or seeing spots . Feeling nauseated or throwing up . Swelling in face and hands  Zone 3: EMERGENCY  Seek immediate medical care if you have any of the following:  . BP reading is greater than160 (top number) or greater than 110 (bottom number) . Severe headaches not improving with Tylenol . Serious difficulty catching your breath . Any worsening symptoms from Zone 2

## 2019-07-23 NOTE — Progress Notes (Signed)
[place] [id number] wks,cephalic,fhr [id number] bpm,BPP [id number] placenta gr 3,afi 11.9 cm

## 2019-07-23 NOTE — Progress Notes (Signed)
   HIGH-RISK PREGNANCY VISIT Patient name: Patricia Richards MRN [id number]  Date of birth: 1991-03-17 Chief Complaint:   Routine Prenatal Visit (Ultrasound)  History of Present Illness:   Patricia Richards is a 29 y.o. [phone] female at [redacted]w[redacted]d with an Estimated Date of Delivery: 08/23/19 being seen today for ongoing management of a high-risk pregnancy complicated by [id number] currently on lantus Today she reports no complaints. Contractions: Irregular. Vag. Bleeding: None.  Movement: Present. denies leaking of fluid.  Review of Systems:   Pertinent items are noted in HPI Denies abnormal vaginal discharge w/ itching/odor/irritation, headaches, visual changes, shortness of breath, chest pain, abdominal pain, severe nausea/vomiting, or problems with urination or bowel movements unless otherwise stated above. Pertinent History Reviewed:  Reviewed past medical,surgical, social, obstetrical and family history.  Reviewed problem list, medications and allergies. Physical Assessment:   Vitals:   07/23/19 1554  BP: 113/67  Pulse: (!) 102  Weight: 164 lb (74.4 kg)  Body mass index is 29.05 kg/m.           Physical Examination:   General appearance: alert, well appearing, and in no distress  Mental status: alert, oriented to person, place, and time  Skin: warm & dry   Extremities: Edema: None    Cardiovascular: normal heart rate noted  Respiratory: normal respiratory effort, no distress  Abdomen: gravid, soft, non-tender  Pelvic: Cervical exam deferred         Fetal Status:     Movement: Present    Fetal Surveillance Testing today: [place] [id number] wks,cephalic,fhr [id number] bpm,BPP [id number] placenta gr 3,afi 11.9 cm   Results for orders placed or performed in visit on 07/23/19 (from the past 24 hour(s))  POC Urinalysis Dipstick OB   Collection Time: 07/23/19  3:42 PM  Result Value Ref Range   Color, UA     Clarity, UA     Glucose, UA Negative Negative   Bilirubin, UA     Ketones, UA n    Spec Grav, UA     Blood, UA n    pH, UA     POC,PROTEIN,UA Negative Negative, Trace, Small (1+), Moderate (2+), Large (3+), 4+   Urobilinogen, UA     Nitrite, UA n    Leukocytes, UA Negative Negative   Appearance     Odor      Assessment & Plan:  1) High-risk pregnancy [id number] at [redacted]w[redacted]d with an Estimated Date of Delivery: 08/23/19   2) A2DM on lanuts 20mg  q hs, unstable (3/5 ^^)  Increase lantus to 30 qhs  Treatment plan  Twice weekly NST; EFW 38 weeks  3) chronic klonopin, stable    4)  HSV;  On meds  Meds: No orders of the defined types were placed in this encounter.   Reviewed: Preterm labor symptoms and general obstetric precautions including but not limited to vaginal bleeding, contractions, leaking of fluid and fetal movement were reviewed in detail with the patient.  All questions were answered. Has home bp cuff. Check bp weekly, let us know if >140/90.   Follow-up: Return for Mondays for NST/HROB and Thursdays for NST only; [place] for EFW/HROB on 3/22.  No future appointments.  Orders Placed This Encounter  Procedures  . POC Urinalysis Dipstick OB   Christin Fudge DNP, CNM 07/23/2019 4:17 PM

## 2019-07-26 ENCOUNTER — Other Ambulatory Visit: Payer: Medicaid Other

## 2019-07-30 ENCOUNTER — Other Ambulatory Visit (HOSPITAL_COMMUNITY)
Admission: RE | Admit: 2019-07-30 | Discharge: 2019-07-30 | Disposition: A | Discharge status: Home / Self Care | Payer: Medicaid Other | Source: Ambulatory Visit | Attending: Obstetrics & Gynecology | Admitting: Obstetrics & Gynecology

## 2019-07-30 ENCOUNTER — Ambulatory Visit (INDEPENDENT_AMBULATORY_CARE_PROVIDER_SITE_OTHER): Payer: Medicaid Other | Admitting: Women's Health

## 2019-07-30 ENCOUNTER — Other Ambulatory Visit: Payer: Self-pay

## 2019-07-30 ENCOUNTER — Encounter: Payer: Self-pay | Admitting: Women's Health

## 2019-07-30 VITALS — BP 116/75 | HR 103 | Wt 169.0 lb

## 2019-07-30 DIAGNOSIS — Z331 Pregnant state, incidental: Secondary | ICD-10-CM

## 2019-07-30 DIAGNOSIS — O24414 Gestational diabetes mellitus in pregnancy, insulin controlled: Secondary | ICD-10-CM

## 2019-07-30 DIAGNOSIS — O98319 Other infections with a predominantly sexual mode of transmission complicating pregnancy, unspecified trimester: Secondary | ICD-10-CM

## 2019-07-30 DIAGNOSIS — O0990 Supervision of high risk pregnancy, unspecified, unspecified trimester: Secondary | ICD-10-CM | POA: Insufficient documentation

## 2019-07-30 DIAGNOSIS — Z3A36 36 weeks gestation of pregnancy: Secondary | ICD-10-CM

## 2019-07-30 DIAGNOSIS — A6009 Herpesviral infection of other urogenital tract: Secondary | ICD-10-CM

## 2019-07-30 DIAGNOSIS — O0993 Supervision of high risk pregnancy, unspecified, third trimester: Secondary | ICD-10-CM

## 2019-07-30 DIAGNOSIS — Z1389 Encounter for screening for other disorder: Secondary | ICD-10-CM

## 2019-07-30 LAB — POCT URINALYSIS DIPSTICK OB
Blood, UA: NEGATIVE
Glucose, UA: NEGATIVE
Ketones, UA: NEGATIVE
Leukocytes, UA: NEGATIVE
Nitrite, UA: NEGATIVE
POC,PROTEIN,UA: NEGATIVE

## 2019-07-30 MED ORDER — GLYBURIDE 5 MG PO TABS: 5.0000 mg | ORAL_TABLET | Freq: Two times a day (BID) | ORAL | 0 refills | Status: DC

## 2019-07-30 NOTE — Progress Notes (Signed)
HIGH-RISK PREGNANCY VISIT Patient name: Patricia Richards MRN [id number]  Date of birth: 1991-01-25 Chief Complaint:   High Risk Gestation (NST today; + contractions )  History of Present Illness:   Patricia Richards is a 29 y.o. [phone] female at [redacted]w[redacted]d with an Estimated Date of Delivery: 08/23/19 being seen today for ongoing management of a high-risk pregnancy complicated by diabetes mellitus A2DM currently on Lantus 30u qhs, suspected LGA, chronic klonopin use.   Today she reports FBS 91-101 (majority >95), 2hr pp 109-181 (majority >120). Contractions: Irregular. Vag. Bleeding: None.  Movement: Present. denies leaking of fluid.  Review of Systems:   Pertinent items are noted in HPI Denies abnormal vaginal discharge w/ itching/odor/irritation, headaches, visual changes, shortness of breath, chest pain, abdominal pain, severe nausea/vomiting, or problems with urination or bowel movements unless otherwise stated above. Pertinent History Reviewed:  Reviewed past medical,surgical, social, obstetrical and family history.  Reviewed problem list, medications and allergies. Physical Assessment:   Vitals:   07/30/19 1529  BP: 116/75  Pulse: (!) 103  Weight: 169 lb (76.7 kg)  Body mass index is 29.94 kg/m.           Physical Examination:   General appearance: alert, well appearing, and in no distress  Mental status: alert, oriented to person, place, and time  Skin: warm & dry   Extremities: Edema: None    Cardiovascular: normal heart rate noted  Respiratory: normal respiratory effort, no distress  Abdomen: gravid, soft, non-tender  Pelvic: Cervical exam performed  Dilation: 1.5 Effacement (%): Thick Station: Ballotable  Fetal Status: Fetal Heart Rate (bpm): 125   Movement: Present Presentation: Vertex  Fetal Surveillance Testing today: NST: FHR baseline 125 bpm, Variability: moderate, Accelerations:present, Decelerations:  Absent= Cat 1/Reactive Toco: irregular     Chaperone:  Levy Pupa    Results for orders placed or performed in visit on 07/30/19 (from the past 24 hour(s))  POC Urinalysis Dipstick OB   Collection Time: 07/30/19  3:30 PM  Result Value Ref Range   Color, UA     Clarity, UA     Glucose, UA Negative Negative   Bilirubin, UA     Ketones, UA neg    Spec Grav, UA     Blood, UA neg    pH, UA     POC,PROTEIN,UA Negative Negative, Trace, Small (1+), Moderate (2+), Large (3+), 4+   Urobilinogen, UA     Nitrite, UA neg    Leukocytes, UA Negative Negative   Appearance     Odor      Assessment & Plan:  1) High-risk pregnancy [id number] at [redacted]w[redacted]d with an Estimated Date of Delivery: 08/23/19   2) A2DM, unstable, continue Lantus 30uqhs, discussed w/ LHE, add glyburide 5mg  BID  3) Suspected LGA, 97% @ 32wks, has repeat EFW next week  4) Chronic klonopin use> stable  Meds:  Meds ordered this encounter  Medications  . glyBURIDE (DIABETA) 5 MG tablet    Sig: Take 1 tablet (5 mg total) by mouth 2 (two) times daily with a meal.    Dispense:  60 tablet    Refill:  0    Order Specific Question:   Supervising Provider    Answer:   Tania Ade H [[id number]]    Labs/procedures today: nst, sve, gbs, gc/ct  Treatment Plan:  2x/wk testing, EFW next week, IOL @ 39wks  Reviewed: Term labor symptoms and general obstetric precautions including but not limited to vaginal bleeding, contractions, leaking of  fluid and fetal movement were reviewed in detail with the patient.  All questions were answered.   Follow-up: Return for As scheduled Thurs NST w/ nurse.  Orders Placed This Encounter  Procedures  . Strep Gp B NAA  . POC Urinalysis Dipstick OB   Roma Schanz CNM, [facility] 07/30/2019 5:20 PM

## 2019-07-30 NOTE — Patient Instructions (Signed)
[facility]     [email or web address]      [phone]  Add glyburide 5mg  in the morning and at bedtime  [place], I greatly value your feedback.  If you receive a survey following your visit with [place] today, we appreciate you taking the time to fill it out.  Thanks, Knute Neu, CNM, [facility]  [place]!!! It is now [facility] ([place], [place] [address]) Entrance located off of [address] parking   Go to [email or web address] to register for FREE online childbirth classes    Call the office [phone]) or go to [facility] if:  You begin to have strong, frequent contractions  Your water breaks.  Sometimes it is a big gush of fluid, sometimes it is just a trickle that keeps getting your panties wet or running down your legs  You have vaginal bleeding.  It is normal to have a small amount of spotting if your cervix was checked.   You don't feel your baby moving like normal.  If you don't, get you something to eat and drink and lay down and focus on feeling your baby move.  You should feel at least 10 movements in 2 hours.  If you don't, you should call the office or go to [place] Blood Pressure Monitoring for Patients   Your provider has recommended that you check your blood pressure (BP) at least once a week at home. If you do not have a blood pressure cuff at home, one will be provided for you. Contact your provider if you have not received your monitor within 1 week.   Helpful Tips for Accurate Home Blood Pressure Checks  . Don't smoke, exercise, or drink caffeine 30 minutes before checking your BP . Use the restroom before checking your BP (a full bladder can raise your pressure) . Relax in a comfortable upright chair . Feet on the ground . Left arm resting comfortably on a flat surface at the level of your heart . Legs uncrossed . Back supported . Sit  quietly and don't talk . Place the cuff on your bare arm . Adjust snuggly, so that only two fingertips can fit between your skin and the top of the cuff . Check 2 readings separated by at least one minute . Keep a log of your BP readings . For a visual, please reference this diagram: http://ccnc.care/bpdiagram  Provider Name: [facility]     Phone: [phone]  Zone 1: ALL CLEAR  Continue to monitor your symptoms:  . BP reading is less than 140 (top number) or less than 90 (bottom number)  . No right upper stomach pain . No headaches or seeing spots . No feeling nauseated or throwing up . No swelling in face and hands  Zone 2: CAUTION Call your doctor's office for any of the following:  . BP reading is greater than 140 (top number) or greater than 90 (bottom number)  . Stomach pain under your ribs in the middle or right side . Headaches or seeing spots . Feeling nauseated or throwing up . Swelling in face and hands  Zone 3: EMERGENCY  Seek immediate medical care if you have any of the following:  . BP reading is greater than160 (top number) or greater than 110 (bottom number) . Severe headaches not improving with Tylenol . Serious difficulty catching your breath . Any worsening symptoms from Zone  2    Braxton Hicks Contractions Contractions of the uterus can occur throughout pregnancy, but they are not always a sign that you are in labor. You may have practice contractions called Braxton Hicks contractions. These false labor contractions are sometimes confused with true labor. What are [place] contractions? Braxton Hicks contractions are tightening movements that occur in the muscles of the uterus before labor. Unlike true labor contractions, these contractions do not result in opening (dilation) and thinning of the cervix. Toward the end of pregnancy (32-34 weeks), Braxton Hicks contractions can happen more often and may become stronger. These contractions are  sometimes difficult to tell apart from true labor because they can be very uncomfortable. You should not feel embarrassed if you go to the hospital with false labor. Sometimes, the only way to tell if you are in true labor is for your health care provider to look for changes in the cervix. The health care provider will do a physical exam and may monitor your contractions. If you are not in true labor, the exam should show that your cervix is not dilating and your water has not broken. If there are no other health problems associated with your pregnancy, it is completely safe for you to be sent home with false labor. You may continue to have Braxton Hicks contractions until you go into true labor. How to tell the difference between true labor and false labor True labor  Contractions last 30-70 seconds.  Contractions become very regular.  Discomfort is usually felt in the top of the uterus, and it spreads to the lower abdomen and low back.  Contractions do not go away with walking.  Contractions usually become more intense and increase in frequency.  The cervix dilates and gets thinner. False labor  Contractions are usually shorter and not as strong as true labor contractions.  Contractions are usually irregular.  Contractions are often felt in the front of the lower abdomen and in the groin.  Contractions may go away when you walk around or change positions while lying down.  Contractions get weaker and are shorter-lasting as time goes on.  The cervix usually does not dilate or become thin. Follow these instructions at home:   Take over-the-counter and prescription medicines only as told by your health care provider.  Keep up with your usual exercises and follow other instructions from your health care provider.  Eat and drink lightly if you think you are going into labor.  If Braxton Hicks contractions are making you uncomfortable: ? Change your position from lying down or  resting to walking, or change from walking to resting. ? Sit and rest in a tub of warm water. ? Drink enough fluid to keep your urine pale yellow. Dehydration may cause these contractions. ? Do slow and deep breathing several times an hour.  Keep all follow-up prenatal visits as told by your health care provider. This is important. Contact a health care provider if:  You have a fever.  You have continuous pain in your abdomen. Get help right away if:  Your contractions become stronger, more regular, and closer together.  You have fluid leaking or gushing from your vagina.  You pass blood-tinged mucus (bloody show).  You have bleeding from your vagina.  You have low back pain that you never had before.  You feel your baby's head pushing down and causing pelvic pressure.  Your baby is not moving inside you as much as it used to. Summary  Contractions  that occur before labor are called Braxton Hicks contractions, false labor, or practice contractions.  Braxton Hicks contractions are usually shorter, weaker, farther apart, and less regular than true labor contractions. True labor contractions usually become progressively stronger and regular, and they become more frequent.  Manage discomfort from [facility] contractions by changing position, resting in a warm bath, drinking plenty of water, or practicing deep breathing. This information is not intended to replace advice given to you by your health care provider. Make sure you discuss any questions you have with your health care provider. Document Revised: 04/15/2017 Document Reviewed: 09/16/2016 Elsevier Patient Education  [place].

## 2019-08-01 LAB — CERVICOVAGINAL ANCILLARY ONLY
Chlamydia: NEGATIVE
Comment: NEGATIVE
Comment: NORMAL
Neisseria Gonorrhea: NEGATIVE

## 2019-08-01 LAB — STREP GP B NAA: Strep Gp B NAA: NEGATIVE

## 2019-08-02 ENCOUNTER — Other Ambulatory Visit: Payer: Self-pay

## 2019-08-02 ENCOUNTER — Ambulatory Visit (INDEPENDENT_AMBULATORY_CARE_PROVIDER_SITE_OTHER): Payer: Medicaid Other | Admitting: *Deleted

## 2019-08-02 VITALS — BP 105/70 | HR 110 | Wt 167.0 lb

## 2019-08-02 DIAGNOSIS — O0990 Supervision of high risk pregnancy, unspecified, unspecified trimester: Secondary | ICD-10-CM

## 2019-08-02 DIAGNOSIS — Z1389 Encounter for screening for other disorder: Secondary | ICD-10-CM

## 2019-08-02 DIAGNOSIS — O24414 Gestational diabetes mellitus in pregnancy, insulin controlled: Secondary | ICD-10-CM

## 2019-08-02 DIAGNOSIS — Z3A37 37 weeks gestation of pregnancy: Secondary | ICD-10-CM

## 2019-08-02 DIAGNOSIS — O288 Other abnormal findings on antenatal screening of mother: Secondary | ICD-10-CM

## 2019-08-02 DIAGNOSIS — A6009 Herpesviral infection of other urogenital tract: Secondary | ICD-10-CM

## 2019-08-02 DIAGNOSIS — Z331 Pregnant state, incidental: Secondary | ICD-10-CM

## 2019-08-02 LAB — POCT URINALYSIS DIPSTICK OB
Blood, UA: NEGATIVE
Glucose, UA: NEGATIVE
Ketones, UA: NEGATIVE
Leukocytes, UA: NEGATIVE
Nitrite, UA: NEGATIVE

## 2019-08-02 NOTE — Progress Notes (Signed)
   NURSE VISIT- NST  SUBJECTIVE:  Patricia Richards is a 29 y.o. [phone] female at [redacted]w[redacted]d, here for a NST for pregnancy complicated by [id number] currently on insulin.  She reports active fetal movement, contractions: none, vaginal bleeding: none, membranes: intact.   OBJECTIVE:  BP 105/70   Pulse (!) 110   Wt 167 lb (75.8 kg)   LMP 11/08/2018   BMI 29.58 kg/m   Appears well, no apparent distress  Results for orders placed or performed in visit on 08/02/19 (from the past 24 hour(s))  POC Urinalysis Dipstick OB   Collection Time: 08/02/19  3:13 PM  Result Value Ref Range   Color, UA     Clarity, UA     Glucose, UA Negative Negative   Bilirubin, UA     Ketones, UA neg    Spec Grav, UA     Blood, UA neg    pH, UA     POC,PROTEIN,UA Trace Negative, Trace, Small (1+), Moderate (2+), Large (3+), 4+   Urobilinogen, UA     Nitrite, UA neg    Leukocytes, UA Negative Negative   Appearance     Odor      NST: FHR baseline 135 bpm, Variability: moderate, Accelerations:present, Decelerations:  Absent= Cat 1 /Reactive Toco: none   ASSESSMENT: [id number] at [redacted]w[redacted]d with A2DM currently on insulin NST reactive  PLAN: EFM strip reviewed by [name], CNM   Recommendations: keep next appointment as scheduled    Patricia Richards  08/02/2019 3:48 PM

## 2019-08-06 ENCOUNTER — Ambulatory Visit (INDEPENDENT_AMBULATORY_CARE_PROVIDER_SITE_OTHER): Payer: Medicaid Other | Admitting: *Deleted

## 2019-08-06 ENCOUNTER — Other Ambulatory Visit: Payer: Self-pay

## 2019-08-06 VITALS — BP 99/66 | HR 90 | Wt 168.5 lb

## 2019-08-06 DIAGNOSIS — A6009 Herpesviral infection of other urogenital tract: Secondary | ICD-10-CM

## 2019-08-06 DIAGNOSIS — Z3A37 37 weeks gestation of pregnancy: Secondary | ICD-10-CM

## 2019-08-06 DIAGNOSIS — Z1389 Encounter for screening for other disorder: Secondary | ICD-10-CM

## 2019-08-06 DIAGNOSIS — O24414 Gestational diabetes mellitus in pregnancy, insulin controlled: Secondary | ICD-10-CM | POA: Diagnosis not present

## 2019-08-06 DIAGNOSIS — O0990 Supervision of high risk pregnancy, unspecified, unspecified trimester: Secondary | ICD-10-CM

## 2019-08-06 DIAGNOSIS — Z331 Pregnant state, incidental: Secondary | ICD-10-CM

## 2019-08-06 LAB — POCT URINALYSIS DIPSTICK OB
Glucose, UA: NEGATIVE
Ketones, UA: NEGATIVE
Leukocytes, UA: NEGATIVE
Nitrite, UA: NEGATIVE
POC,PROTEIN,UA: NEGATIVE

## 2019-08-06 NOTE — Progress Notes (Signed)
   NURSE VISIT- NST  SUBJECTIVE:  Patricia Richards is a 29 y.o. [phone] female at [redacted]w[redacted]d, here for a NST for pregnancy complicated by [id number] currently on Lantus/Glyburide.  She reports active fetal movement, contractions: none, vaginal bleeding: none, membranes: intact.   OBJECTIVE:  BP 99/66   Pulse 90   Wt 168 lb 8 oz (76.4 kg)   LMP 11/08/2018   BMI 29.85 kg/m   Appears well, no apparent distress  Results for orders placed or performed in visit on 08/06/19 (from the past 24 hour(s))  POC Urinalysis Dipstick OB   Collection Time: 08/06/19  3:47 PM  Result Value Ref Range   Color, UA     Clarity, UA     Glucose, UA Negative Negative   Bilirubin, UA     Ketones, UA neg    Spec Grav, UA     Blood, UA trace    pH, UA     POC,PROTEIN,UA Negative Negative, Trace, Small (1+), Moderate (2+), Large (3+), 4+   Urobilinogen, UA     Nitrite, UA neg    Leukocytes, UA Negative Negative   Appearance     Odor      NST: FHR baseline 130 bpm, Variability: moderate, Accelerations:present, Decelerations:  Absent= Cat 1/Reactive Toco: none   ASSESSMENT: [id number] at [redacted]w[redacted]d with A2DM currently on Lantus/Glyburide NST reactive  PLAN: EFM strip reviewed by Dr. Elonda Husky   Recommendations: keep next appointment as scheduled    Alice Rieger  08/06/2019 4:22 PM

## 2019-08-08 ENCOUNTER — Other Ambulatory Visit: Payer: Self-pay | Admitting: Women's Health

## 2019-08-08 DIAGNOSIS — O24419 Gestational diabetes mellitus in pregnancy, unspecified control: Secondary | ICD-10-CM

## 2019-08-09 ENCOUNTER — Other Ambulatory Visit: Payer: Self-pay

## 2019-08-09 ENCOUNTER — Ambulatory Visit (INDEPENDENT_AMBULATORY_CARE_PROVIDER_SITE_OTHER): Payer: Medicaid Other | Admitting: Advanced Practice Midwife

## 2019-08-09 ENCOUNTER — Ambulatory Visit (INDEPENDENT_AMBULATORY_CARE_PROVIDER_SITE_OTHER): Payer: Medicaid Other

## 2019-08-09 VITALS — BP 122/79 | HR 90 | Wt 168.0 lb

## 2019-08-09 DIAGNOSIS — O24414 Gestational diabetes mellitus in pregnancy, insulin controlled: Secondary | ICD-10-CM

## 2019-08-09 DIAGNOSIS — O99323 Drug use complicating pregnancy, third trimester: Secondary | ICD-10-CM

## 2019-08-09 DIAGNOSIS — A6009 Herpesviral infection of other urogenital tract: Secondary | ICD-10-CM

## 2019-08-09 DIAGNOSIS — O09893 Supervision of other high risk pregnancies, third trimester: Secondary | ICD-10-CM

## 2019-08-09 DIAGNOSIS — O24419 Gestational diabetes mellitus in pregnancy, unspecified control: Secondary | ICD-10-CM

## 2019-08-09 DIAGNOSIS — Z1389 Encounter for screening for other disorder: Secondary | ICD-10-CM

## 2019-08-09 DIAGNOSIS — Z3A38 38 weeks gestation of pregnancy: Secondary | ICD-10-CM

## 2019-08-09 DIAGNOSIS — O98319 Other infections with a predominantly sexual mode of transmission complicating pregnancy, unspecified trimester: Secondary | ICD-10-CM

## 2019-08-09 DIAGNOSIS — F1310 Sedative, hypnotic or anxiolytic abuse, uncomplicated: Secondary | ICD-10-CM

## 2019-08-09 DIAGNOSIS — Z331 Pregnant state, incidental: Secondary | ICD-10-CM

## 2019-08-09 DIAGNOSIS — O0990 Supervision of high risk pregnancy, unspecified, unspecified trimester: Secondary | ICD-10-CM

## 2019-08-09 LAB — POCT URINALYSIS DIPSTICK OB
Blood, UA: NEGATIVE
Glucose, UA: NEGATIVE
Ketones, UA: NEGATIVE
Leukocytes, UA: NEGATIVE
Nitrite, UA: NEGATIVE

## 2019-08-09 NOTE — Progress Notes (Signed)
   Induction Assessment Scheduling Form: Fax to [facility]:  [id number]  Patricia Richards                                                                                   DOB:  13-Mar-1991                                                            MRN:  [id number]                                                                     Phone #:  [phone]                         [facility]  GP:  [id number]                                                            Estimated Date of Delivery: 08/23/19  Dating Criteria: [place]    Medical Indications for induction:  A2DM Admission Date/Time:  08/13/19 Gestational age on admission:  38.4   Filed Weights   08/09/19 1603  Weight: 168 lb (76.2 kg)   HIV:  Non Reactive (01/06 0844) GBS: --Henderson Cloud (03/15 1630)     Method of induction(proposed):  cytotec   Scheduling Provider Signature:  Christin Fudge, CNM                                            Today's Date:  08/09/2019

## 2019-08-09 NOTE — Progress Notes (Signed)
[place] 38 wks,cephalic,anterior placenta gr 2,BPP 8/8,AFI 11.8 cm,fhr 135 bpm,EFW 3625 g 82% AC 92%

## 2019-08-09 NOTE — Addendum Note (Signed)
Addended by: Christin Fudge on: 08/09/2019 07:10 PM   Modules accepted: Orders, SmartSet

## 2019-08-09 NOTE — Progress Notes (Signed)
   HIGH-RISK PREGNANCY VISIT Patient name: Patricia Richards MRN [id number]  Date of birth: [birth year] Chief Complaint:   Routine Prenatal Visit (Ultrasound)  History of Present Illness:   Randilee Richards is a 29 y.o. [phone] female at [redacted]w[redacted]d with an Estimated Date of Delivery: 08/23/19 being seen today for ongoing management of a high-risk pregnancy complicated by [id number] currently on lantus/glyburide Today she reports no complaints. Contractions: Irregular. Vag. Bleeding: None.  Movement: Present. denies leaking of fluid.  Review of Systems:   Pertinent items are noted in HPI Denies abnormal vaginal discharge w/ itching/odor/irritation, headaches, visual changes, shortness of breath, chest pain, abdominal pain, severe nausea/vomiting, or problems with urination or bowel movements unless otherwise stated above. Blood sugars normal.  Pertinent History Reviewed:  Reviewed past medical,surgical, social, obstetrical and family history.  Reviewed problem list, medications and allergies. Physical Assessment:   Vitals:   08/09/19 1603  BP: 122/79  Pulse: 90  Weight: 168 lb (76.2 kg)  Body mass index is 29.76 kg/m.           Physical Examination:   General appearance: alert, well appearing, and in no distress  Mental status: alert, oriented to person, place, and time  Skin: warm & dry   Extremities: Edema: Trace    Cardiovascular: normal heart rate noted  Respiratory: normal respiratory effort, no distress  Abdomen: gravid, soft, non-tender  Pelvic: Cervical exam performed         Fetal Status:     Movement: Present    Fetal Surveillance Testing today: [place] 38 wks,cephalic,anterior placenta gr 2,BPP 8/8,AFI 11.8 cm,fhr 135 bpm,EFW 3625 g 82% AC 92%   Results for orders placed or performed in visit on 08/09/19 (from the past 24 hour(s))  POC Urinalysis Dipstick OB   Collection Time: 08/09/19  4:02 PM  Result Value Ref Range   Color, UA     Clarity, UA     Glucose, UA Negative  Negative   Bilirubin, UA     Ketones, UA n    Spec Grav, UA     Blood, UA n    pH, UA     POC,PROTEIN,UA Trace Negative, Trace, Small (1+), Moderate (2+), Large (3+), 4+   Urobilinogen, UA     Nitrite, UA n    Leukocytes, UA Negative Negative   Appearance     Odor      Assessment & Plan:  1) High-risk pregnancy [id number] at [redacted]w[redacted]d with an Estimated Date of Delivery: 08/23/19   2) A2Dm, stable Treatment plan  IOL 3/29 (transprotation issues, requests this date because she willhave a ride)  Meds: No orders of the defined types were placed in this encounter.   Labs/procedures today: BPP   Reviewed: Term labor symptoms and general obstetric precautions including but not limited to vaginal bleeding, contractions, leaking of fluid and fetal movement were reviewed in detail with the patient.  All questions were answered.  Follow-up: Return for 5 weeks for postpartum  and 6 weeks for paragard.  Future Appointments  Date Time Provider [place]  08/13/2019  9:15 AM [facility] SCHED ROOM [facility] None  08/13/2019  3:10 PM Florian Buff, MD [facility]  08/16/2019  3:30 PM [facility] NURSE [facility]    Orders Placed This Encounter  Procedures  . POC Urinalysis Dipstick OB   Christin Fudge DNP, CNM 08/09/2019 4:32 PM

## 2019-08-10 ENCOUNTER — Telehealth (HOSPITAL_COMMUNITY): Payer: Self-pay | Admitting: *Deleted

## 2019-08-10 ENCOUNTER — Encounter (HOSPITAL_COMMUNITY): Payer: Self-pay | Admitting: *Deleted

## 2019-08-10 NOTE — Telephone Encounter (Signed)
Preadmission screen  

## 2019-08-11 ENCOUNTER — Other Ambulatory Visit (HOSPITAL_COMMUNITY)
Admission: RE | Admit: 2019-08-11 | Discharge: 2019-08-11 | Disposition: A | Discharge status: Home / Self Care | Payer: Medicaid Other | Source: Ambulatory Visit | Attending: Family Medicine | Admitting: Family Medicine

## 2019-08-11 DIAGNOSIS — Z20822 Contact with and (suspected) exposure to covid-19: Secondary | ICD-10-CM | POA: Insufficient documentation

## 2019-08-11 DIAGNOSIS — Z01812 Encounter for preprocedural laboratory examination: Secondary | ICD-10-CM | POA: Insufficient documentation

## 2019-08-11 LAB — SARS CORONAVIRUS 2 (TAT 6-24 HRS): SARS Coronavirus 2: NEGATIVE

## 2019-08-13 ENCOUNTER — Inpatient Hospital Stay (HOSPITAL_COMMUNITY): Payer: Medicaid Other

## 2019-08-13 ENCOUNTER — Other Ambulatory Visit: Payer: Self-pay

## 2019-08-13 ENCOUNTER — Encounter (HOSPITAL_COMMUNITY): Payer: Self-pay | Admitting: Family Medicine

## 2019-08-13 ENCOUNTER — Other Ambulatory Visit: Payer: Medicaid Other | Admitting: Obstetrics & Gynecology

## 2019-08-13 ENCOUNTER — Inpatient Hospital Stay (HOSPITAL_COMMUNITY)
Admission: AD | Admit: 2019-08-13 | Discharge: 2019-08-15 | DRG: 806 | Disposition: A | Discharge status: Home / Self Care | Payer: Medicaid Other | Attending: Obstetrics & Gynecology | Admitting: Obstetrics & Gynecology

## 2019-08-13 DIAGNOSIS — O9832 Other infections with a predominantly sexual mode of transmission complicating childbirth: Secondary | ICD-10-CM | POA: Diagnosis present

## 2019-08-13 DIAGNOSIS — F1011 Alcohol abuse, in remission: Secondary | ICD-10-CM | POA: Diagnosis present

## 2019-08-13 DIAGNOSIS — F319 Bipolar disorder, unspecified: Secondary | ICD-10-CM | POA: Diagnosis present

## 2019-08-13 DIAGNOSIS — A6000 Herpesviral infection of urogenital system, unspecified: Secondary | ICD-10-CM | POA: Diagnosis present

## 2019-08-13 DIAGNOSIS — A6009 Herpesviral infection of other urogenital tract: Secondary | ICD-10-CM | POA: Diagnosis present

## 2019-08-13 DIAGNOSIS — F1390 Sedative, hypnotic, or anxiolytic use, unspecified, uncomplicated: Secondary | ICD-10-CM | POA: Diagnosis present

## 2019-08-13 DIAGNOSIS — O24414 Gestational diabetes mellitus in pregnancy, insulin controlled: Secondary | ICD-10-CM | POA: Diagnosis not present

## 2019-08-13 DIAGNOSIS — O26893 Other specified pregnancy related conditions, third trimester: Secondary | ICD-10-CM | POA: Diagnosis present

## 2019-08-13 DIAGNOSIS — Z8632 Personal history of gestational diabetes: Secondary | ICD-10-CM | POA: Diagnosis present

## 2019-08-13 DIAGNOSIS — Z6791 Unspecified blood type, Rh negative: Secondary | ICD-10-CM | POA: Diagnosis not present

## 2019-08-13 DIAGNOSIS — F418 Other specified anxiety disorders: Secondary | ICD-10-CM | POA: Diagnosis present

## 2019-08-13 DIAGNOSIS — F1991 Other psychoactive substance use, unspecified, in remission: Secondary | ICD-10-CM

## 2019-08-13 DIAGNOSIS — Z9119 Patient's noncompliance with other medical treatment and regimen: Secondary | ICD-10-CM | POA: Diagnosis not present

## 2019-08-13 DIAGNOSIS — Z3A38 38 weeks gestation of pregnancy: Secondary | ICD-10-CM

## 2019-08-13 DIAGNOSIS — O24424 Gestational diabetes mellitus in childbirth, insulin controlled: Principal | ICD-10-CM | POA: Diagnosis present

## 2019-08-13 DIAGNOSIS — Z87891 Personal history of nicotine dependence: Secondary | ICD-10-CM | POA: Diagnosis not present

## 2019-08-13 DIAGNOSIS — F410 Panic disorder [episodic paroxysmal anxiety] without agoraphobia: Secondary | ICD-10-CM | POA: Diagnosis present

## 2019-08-13 DIAGNOSIS — O0990 Supervision of high risk pregnancy, unspecified, unspecified trimester: Secondary | ICD-10-CM

## 2019-08-13 DIAGNOSIS — O98319 Other infections with a predominantly sexual mode of transmission complicating pregnancy, unspecified trimester: Secondary | ICD-10-CM | POA: Diagnosis present

## 2019-08-13 DIAGNOSIS — Z87898 Personal history of other specified conditions: Secondary | ICD-10-CM

## 2019-08-13 DIAGNOSIS — O26899 Other specified pregnancy related conditions, unspecified trimester: Secondary | ICD-10-CM

## 2019-08-13 DIAGNOSIS — Z8659 Personal history of other mental and behavioral disorders: Secondary | ICD-10-CM

## 2019-08-13 DIAGNOSIS — O99344 Other mental disorders complicating childbirth: Secondary | ICD-10-CM | POA: Diagnosis present

## 2019-08-13 DIAGNOSIS — R443 Hallucinations, unspecified: Secondary | ICD-10-CM | POA: Diagnosis present

## 2019-08-13 DIAGNOSIS — O09899 Supervision of other high risk pregnancies, unspecified trimester: Secondary | ICD-10-CM

## 2019-08-13 LAB — CBC
HCT: 30.9 % — ABNORMAL LOW (ref 36.0–46.0)
Hemoglobin: 10 g/dL — ABNORMAL LOW (ref 12.0–15.0)
MCH: 28 pg (ref 26.0–34.0)
MCHC: 32.4 g/dL (ref 30.0–36.0)
MCV: 86.6 fL (ref 80.0–100.0)
Platelets: 214 10*3/uL (ref 150–400)
RBC: 3.57 MIL/uL — ABNORMAL LOW (ref 3.87–5.11)
RDW: 15.4 % (ref 11.5–15.5)
WBC: 7.3 10*3/uL (ref 4.0–10.5)
nRBC: 0 % (ref 0.0–0.2)

## 2019-08-13 LAB — RAPID URINE DRUG SCREEN, HOSP PERFORMED
Amphetamines: NOT DETECTED
Barbiturates: NOT DETECTED
Benzodiazepines: NOT DETECTED
Cocaine: NOT DETECTED
Opiates: NOT DETECTED
Tetrahydrocannabinol: NOT DETECTED

## 2019-08-13 LAB — GLUCOSE, CAPILLARY
Glucose-Capillary: 112 mg/dL — ABNORMAL HIGH (ref 70–99)
Glucose-Capillary: 80 mg/dL (ref 70–99)

## 2019-08-13 MED ORDER — ONDANSETRON HCL 4 MG/2ML IJ SOLN: 4.0000 mg | Freq: Four times a day (QID) | INTRAMUSCULAR | Status: DC | PRN

## 2019-08-13 MED ORDER — FENTANYL-BUPIVACAINE-NACL 0.5-0.125-0.9 MG/250ML-% EP SOLN
12.0000 mL/h | EPIDURAL | Status: DC | PRN
  Filled 2019-08-13: qty 250

## 2019-08-13 MED ORDER — OXYTOCIN 40 UNITS IN NORMAL SALINE INFUSION - SIMPLE MED
2.5000 [IU]/h | INTRAVENOUS | Status: DC
  Filled 2019-08-13: qty 1000

## 2019-08-13 MED ORDER — CLONAZEPAM 0.5 MG PO TABS
1.0000 mg | ORAL_TABLET | Freq: Three times a day (TID) | ORAL | Status: DC | PRN
  Administered 2019-08-13 – 2019-08-14 (×4): 1 mg via ORAL
  Filled 2019-08-13 (×4): qty 2

## 2019-08-13 MED ORDER — OXYTOCIN BOLUS FROM INFUSION
500.0000 mL | Freq: Once | INTRAVENOUS | Status: AC
  Administered 2019-08-14: 500 mL via INTRAVENOUS

## 2019-08-13 MED ORDER — PHENYLEPHRINE 40 MCG/ML (10ML) SYRINGE FOR IV PUSH (FOR BLOOD PRESSURE SUPPORT): 80.0000 ug | PREFILLED_SYRINGE | INTRAVENOUS | Status: DC | PRN

## 2019-08-13 MED ORDER — LACTATED RINGERS IV SOLN: INTRAVENOUS | Status: DC

## 2019-08-13 MED ORDER — HYDROXYZINE HCL 50 MG PO TABS: 50.0000 mg | ORAL_TABLET | Freq: Four times a day (QID) | ORAL | Status: DC | PRN

## 2019-08-13 MED ORDER — FENTANYL CITRATE (PF) 100 MCG/2ML IJ SOLN: 50.0000 ug | INTRAMUSCULAR | Status: DC | PRN

## 2019-08-13 MED ORDER — ACETAMINOPHEN 325 MG PO TABS
650.0000 mg | ORAL_TABLET | ORAL | Status: DC | PRN
  Administered 2019-08-14: 650 mg via ORAL
  Filled 2019-08-13: qty 2

## 2019-08-13 MED ORDER — OXYTOCIN 40 UNITS IN NORMAL SALINE INFUSION - SIMPLE MED: 1.0000 m[IU]/min | INTRAVENOUS | Status: DC

## 2019-08-13 MED ORDER — LACTATED RINGERS IV SOLN: 500.0000 mL | INTRAVENOUS | Status: DC | PRN

## 2019-08-13 MED ORDER — EPHEDRINE 5 MG/ML INJ: 10.0000 mg | INTRAVENOUS | Status: DC | PRN

## 2019-08-13 MED ORDER — LIDOCAINE HCL (PF) 1 % IJ SOLN: 30.0000 mL | INTRAMUSCULAR | Status: DC | PRN

## 2019-08-13 MED ORDER — LACTATED RINGERS IV SOLN: 500.0000 mL | Freq: Once | INTRAVENOUS | Status: DC

## 2019-08-13 MED ORDER — TERBUTALINE SULFATE 1 MG/ML IJ SOLN: 0.2500 mg | Freq: Once | INTRAMUSCULAR | Status: DC | PRN

## 2019-08-13 MED ORDER — SOD CITRATE-CITRIC ACID 500-334 MG/5ML PO SOLN: 30.0000 mL | ORAL | Status: DC | PRN

## 2019-08-13 MED ORDER — DIPHENHYDRAMINE HCL 50 MG/ML IJ SOLN: 12.5000 mg | INTRAMUSCULAR | Status: DC | PRN

## 2019-08-13 MED ORDER — MISOPROSTOL 50MCG HALF TABLET
50.0000 ug | ORAL_TABLET | ORAL | Status: DC
  Administered 2019-08-13: 50 ug via ORAL
  Filled 2019-08-13: qty 1

## 2019-08-13 MED ORDER — FLEET ENEMA 7-19 GM/118ML RE ENEM: 1.0000 | ENEMA | RECTAL | Status: DC | PRN

## 2019-08-13 MED ORDER — ZOLPIDEM TARTRATE 5 MG PO TABS: 5.0000 mg | ORAL_TABLET | Freq: Every evening | ORAL | Status: DC | PRN

## 2019-08-13 NOTE — Progress Notes (Addendum)
Labor Progress Note Patricia Richards is a 29 y.o. [id number] at [redacted]w[redacted]d presented for IOL for A2GDM, noncompliant  S:  Comfortable, no c/o.   O:  BP 110/65   Pulse (!) 103   Temp 98.2 F (36.8 C) (Oral)   Resp 16   Ht 5\' 3"  (1.6 m)   Wt 77.1 kg   LMP 11/08/2018   BMI 30.11 kg/m  EFM: baseline 135 bpm/ mod variability/ + accels/ no decels  Toco/IUPC: rare SVE:  1.5/50/-2  A/P: 29 y.o. [id number] [redacted]w[redacted]d  1. Labor: latent 2. FWB: Cat I 3. Pain: analgesia/anesthesia prn  Consented for FB, FB placed, and pt tolerated well. Will start Cytotec for ripening. Pitocin once ripening completed. Anticipate labor progress and SVD.  Julianne Handler, CNM 5:22 PM

## 2019-08-13 NOTE — H&P (Addendum)
OBSTETRIC ADMISSION HISTORY AND PHYSICAL  Patricia Richards is a 29 y.o. female [phone] with IUP at 6w4dby 7wk [place] for I[place] for A2GDM noncompliant on Insulin. She reports +FMs, No LOF, no VB, no blurry vision, headaches or peripheral edema, and RUQ pain. She plans on formula feeding. She requests paragard for birth control at postpartum visit. [facility]  Dating: By 7wk [place]--->  Estimated Date of Delivery: 08/23/19  Sono:    _0 , CWD, normal anatomy, cephalic presentation, anterior placenta, 3625g, 82% EFW   Prenatal History/Complications: AW4OXB insulin controlled Hx genital herpes, on suppression Short interval between pregnancies Homelessness Bipolar 1 disorder Depression with anxiety History of suicidal ideation Panic attacks Hallucinations Long term prescription benzodiazepine use (klonopin) Tachycardia Hx abnormal pap smear Hx alcohol abuse Hx of illicit drug use  Past Medical History: Past Medical History:  Diagnosis Date  . 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 Anemia   . Bacterial infection   . Bipolar 1 disorder ([place]   . Depression    on meds now, doing well  . Gestational diabetes   . Herpes   . Infection    UTI  . Kidney stones   . 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 Panic attacks   . Pregnant   . Urinary tract infection    gets them frequently  . Vaginal Pap smear, abnormal     Past Surgical History: Past Surgical History:  Procedure Laterality Date  . DILATION AND EVACUATION  11/28/2010    Obstetrical History: OB History    Gravida  7   Para  5   Term  5   Preterm  0   AB  1   Living  5     SAB  0   TAB  1   Ectopic  0   Multiple  0   Live Births  5           Social History Social History   Socioeconomic History  . Marital status: Married    Spouse name: RLouie Casa . Number of children: 4   . Years of education: Not on file  . Highest education level: Not on file  Occupational History  . Not on file  Tobacco Use  . Smoking status: Former Smoker    Types: Cigarettes    Quit date: 02/14/2018    Years since quitting: 1.4  . Smokeless tobacco: Never Used  Substance and Sexual Activity  . Alcohol use: Not Currently    Alcohol/week: 0.0 standard drinks    Comment: socially.   . Drug use: Not Currently    Types: Other-see comments, Marijuana, Cocaine, MDMA (Ecstacy)    Comment: none since April 2018  . Sexual activity: Yes    Birth control/protection: None  Other Topics Concern  . Not on file  Social History Narrative  . Not on file   Social Determinants of Health   Financial Resource Strain:   . Difficulty of Paying Living Expenses:   Food Insecurity:   . Worried About RCharity fundraiserin the Last Year:   . RArboriculturistin the Last Year:   Transportation Needs:   . LFilm/video editor(Medical):   .Marland KitchenLack of Transportation (Non-Medical):   Physical Activity:   . Days of Exercise per Week:   . Minutes of Exercise  per Session:   Stress:   . Feeling of Stress :   Social Connections:   . Frequency of Communication with Friends and Family:   . Frequency of Social Gatherings with Friends and Family:   . Attends Religious Services:   . Active Member of Clubs or Organizations:   . Attends Archivist Meetings:   Marland Kitchen Marital Status:    Family History: Family History  Problem Relation Age of Onset  . Hypertension Father   . Cancer Father        skin  . Mental illness Father   . Diabetes Father   . Cancer Maternal Grandmother   . Mental illness Maternal Grandmother   . Stroke Maternal Grandmother   . Cerebral palsy Mother   . Other Brother        problems with gums  . Heart attack Paternal Grandmother   . Down syndrome Cousin   . Bipolar disorder Cousin    Allergies: Allergies  Allergen Reactions  . Metronidazole Shortness Of Breath     Pt states was her anxiety, not really sob/anaphylaxis  . Tramadol Other (See Comments)    "CAUSES REALLY BAD MOOD SWINGS AND HALLUCINATIONS"  . Toradol [Ketorolac Tromethamine] Other (See Comments)    Hallucinations     Medications Prior to Admission  Medication Sig Dispense Refill Last Dose  . Accu-Chek Softclix Lancets lancets Use as instructed 50 each 12   . acetaminophen (TYLENOL) 325 MG tablet Take 650 mg by mouth every 6 (six) hours as needed.     Marland Kitchen acyclovir (ZOVIRAX) 400 MG tablet Take 1 tablet (400 mg total) by mouth 3 (three) times daily. 90 tablet 3   . Blood Glucose Monitoring Suppl (ACCU-CHEK GUIDE ME) w/Device KIT 1 each by Does not apply route 4 (four) times daily. 1 kit 0   . clonazePAM (KLONOPIN) 1 MG tablet 1 three times daily as needed 90 tablet 2   . glucose blood (ACCU-CHEK GUIDE) test strip Use as instructed to check blood sugar 4 times daily 100 each 12   . glyBURIDE (DIABETA) 5 MG tablet Take 1 tablet (5 mg total) by mouth 2 (two) times daily with a meal. 60 tablet 0   . Insulin Glargine (LANTUS) 100 UNIT/ML Solostar Pen Inject 10 Units into the skin at bedtime. (Patient taking differently: Inject 30 Units into the skin at bedtime. ) 15 mL 11   . pantoprazole (PROTONIX) 20 MG tablet Take 1 tablet (20 mg total) by mouth daily. 30 tablet 6    Review of Systems   All systems reviewed and negative except as stated in HPI  Last menstrual period 11/08/2018, not currently breastfeeding. General appearance: alert, cooperative and no distress Lungs: clear to auscultation bilaterally Heart: tachycardia, regular rhythm Abdomen: gravid; soft, non-tender Pelvic: 1.5cm/50% effaced/-2 Extremities: Homans sign is negative, no sign of DVT Presentation: cephalic Fetal monitoringBaseline: 135 bpm, Variability: Good {> 6 bpm), Accelerations: Reactive and Decelerations: Absent Uterine activity occasional   Prenatal labs: ABO, Rh: A/Negative/-- (09/29 1636) Antibody: Negative  (01/06 0844) Rubella: 2.18 (09/29 1636) RPR: Non Reactive (01/06 0844)  HBsAg: Negative (09/29 1636)  HIV: Non Reactive (01/06 0844)  GBS: Negative/-- (03/15 1630)  2 hr Glucola abnormal Genetic screening  normal Anatomy US normal  Prenatal Transfer Tool  Maternal Diabetes: Yes:  Diabetes Type:  Insulin/Medication controlled Genetic Screening: Normal Maternal Ultrasounds/Referrals: Normal Fetal Ultrasounds or other Referrals:  Referred to Materal Fetal Medicine  Maternal Substance Abuse:  None reported. Checking urine  drug screen. Significant Maternal Medications:  Meds include: Protonix Other:  Acyclovir, Glyburide, Insulin, Klonopin Significant Maternal Lab Results: Group B Strep negative and Rh negative    No results found for this or any previous visit (from the past 24 hour(s)).  Patient Active Problem List   Diagnosis Date Noted  . Genital herpes affecting pregnancy, antepartum 06/14/2019  . Gestational diabetes mellitus (GDM) requiring insulin 05/24/2019  . Supervision of high risk pregnancy, antepartum 02/13/2019  . Short interval between pregnancies affecting pregnancy, antepartum 12/12/2018  . History of gestational diabetes 10/24/2018  . Tachycardia 04/20/2018  . History of illicit drug use 16/02/9603  . 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  . Abnormal Pap smear of cervix 05/20/2015  . Depression with anxiety 05/13/2015  . Hallucinations 05/13/2015  . History of suicidal ideation 05/13/2015  . History of alcohol abuse 03/18/2015  . Rh negative state in antepartum period 08/28/2013   Assessment/Plan:  Patricia Richards is a 29 y.o. [id number] at 34w4dhere for IOL for A2GDM, noncompliant on Insulin  #Labor: PO Cytotec, Foley bulb placed #Pain: Epidural if needed #FWB: Category 1 #ID:  GBS negative #MOF: formula #MOC: Paragard at post partum visit #Circ:  Declined #Rh negative: Rh assessment postpartum #A2GDM:   ABruna Potter Medical Student   08/13/2019, 4:07 PM  Midwife attestation: I have seen and examined this patient; I agree with above documentation in the student's note.   PE: Gen: calm comfortable, NAD Resp: normal effort and rate Abd: gravid ROS, labs, PMH reviewed GU: normal, no visible lesions, SVE 1.5/50/-2  Assessment/Plan: Patricia PUPOis a 29y.o. [id number] for IOL  Admit to LD Labor: latent FWB: Cat I GBS neg Cervical ripening then Pitocin CBG q4 hrs Anticipate SVD  MJulianne Handler CNM  08/13/2019, 5:31 PM

## 2019-08-13 NOTE — Progress Notes (Addendum)
Labor Progress Note Patricia Richards is a 29 y.o. [id number] at 34w4dpresented for IOL for A2GDM, noncompliant.  S:  Feeling strong ctx. Desires AROM. Met patient and discussed plan.   O:  BP 110/65   Pulse (!) 103   Temp 98.2 F (36.8 C) (Oral)   Resp 16   Ht 5' 3" (1.6 m)   Wt 77.1 kg   LMP 11/08/2018   BMI 30.11 kg/m  EFM: baseline 135 bpm/ mod variability/ + accels/ no decels  Toco/IUPC: q2-476mSVE: Dilation: 7 Effacement (%): 80 Station: -2 Presentation: Vertex Exam by:: Dr. FaMarice PotterA/P: [age].o. [id number] for IOL for non-compliant GDMA2.  1. Labor: Progressing well. S/p Cytotec and FB and now laboring on her own. AROM with moderate amount of clear fluid at this exam. Anticipate SVD soon.  2. FWB: Cat I 3. Pain: analgesia/anesthesia prn 4. GDMA2: last glucose 112; PTA Lantus 30 units qhs. A1c 5.1 in Sept 2020. 5. MOC: Discussed Paragard again. Patient desires placement outpatient.   CheChauncey MannD 7:25 PM

## 2019-08-14 ENCOUNTER — Inpatient Hospital Stay (HOSPITAL_COMMUNITY): Payer: Medicaid Other | Admitting: Anesthesiology

## 2019-08-14 ENCOUNTER — Encounter (HOSPITAL_COMMUNITY): Payer: Self-pay | Admitting: Family Medicine

## 2019-08-14 DIAGNOSIS — O24414 Gestational diabetes mellitus in pregnancy, insulin controlled: Secondary | ICD-10-CM

## 2019-08-14 DIAGNOSIS — O24419 Gestational diabetes mellitus in pregnancy, unspecified control: Secondary | ICD-10-CM

## 2019-08-14 DIAGNOSIS — Z3A38 38 weeks gestation of pregnancy: Secondary | ICD-10-CM

## 2019-08-14 LAB — TYPE AND SCREEN
ABO/RH(D): A NEG
Antibody Screen: POSITIVE

## 2019-08-14 LAB — GLUCOSE, CAPILLARY
Glucose-Capillary: 97 mg/dL (ref 70–99)
Glucose-Capillary: 125 mg/dL — ABNORMAL HIGH (ref 70–99)

## 2019-08-14 LAB — RPR: RPR Ser Ql: NONREACTIVE

## 2019-08-14 MED ORDER — DIBUCAINE (PERIANAL) 1 % EX OINT: 1.0000 "application " | TOPICAL_OINTMENT | CUTANEOUS | Status: DC | PRN

## 2019-08-14 MED ORDER — SIMETHICONE 80 MG PO CHEW: 80.0000 mg | CHEWABLE_TABLET | ORAL | Status: DC | PRN

## 2019-08-14 MED ORDER — ONDANSETRON HCL 4 MG/2ML IJ SOLN: 4.0000 mg | INTRAMUSCULAR | Status: DC | PRN

## 2019-08-14 MED ORDER — SENNOSIDES-DOCUSATE SODIUM 8.6-50 MG PO TABS
2.0000 | ORAL_TABLET | ORAL | Status: DC
  Filled 2019-08-14: qty 2

## 2019-08-14 MED ORDER — BENZOCAINE-MENTHOL 20-0.5 % EX AERO: 1.0000 "application " | INHALATION_SPRAY | CUTANEOUS | Status: DC | PRN

## 2019-08-14 MED ORDER — DIPHENHYDRAMINE HCL 25 MG PO CAPS: 25.0000 mg | ORAL_CAPSULE | Freq: Four times a day (QID) | ORAL | Status: DC | PRN

## 2019-08-14 MED ORDER — WITCH HAZEL-GLYCERIN EX PADS: 1.0000 "application " | MEDICATED_PAD | CUTANEOUS | Status: DC | PRN

## 2019-08-14 MED ORDER — OXYCODONE HCL 5 MG PO TABS
5.0000 mg | ORAL_TABLET | ORAL | Status: DC | PRN
  Administered 2019-08-14 – 2019-08-15 (×6): 5 mg via ORAL
  Filled 2019-08-14 (×6): qty 1

## 2019-08-14 MED ORDER — COCONUT OIL OIL: 1.0000 "application " | TOPICAL_OIL | Status: DC | PRN

## 2019-08-14 MED ORDER — TETANUS-DIPHTH-ACELL PERTUSSIS 5-2.5-18.5 LF-MCG/0.5 IM SUSP: 0.5000 mL | Freq: Once | INTRAMUSCULAR | Status: DC

## 2019-08-14 MED ORDER — ACETAMINOPHEN 325 MG PO TABS
650.0000 mg | ORAL_TABLET | Freq: Four times a day (QID) | ORAL | Status: DC | PRN
  Administered 2019-08-14 – 2019-08-15 (×5): 650 mg via ORAL
  Filled 2019-08-14 (×5): qty 2

## 2019-08-14 MED ORDER — PRENATAL MULTIVITAMIN CH
1.0000 | ORAL_TABLET | Freq: Every day | ORAL | Status: DC
  Administered 2019-08-14 – 2019-08-15 (×2): 1 via ORAL
  Filled 2019-08-14 (×2): qty 1

## 2019-08-14 MED ORDER — ONDANSETRON HCL 4 MG PO TABS: 4.0000 mg | ORAL_TABLET | ORAL | Status: DC | PRN

## 2019-08-14 MED ORDER — MEASLES, MUMPS & RUBELLA VAC IJ SOLR: 0.5000 mL | Freq: Once | INTRAMUSCULAR | Status: DC

## 2019-08-14 NOTE — Progress Notes (Signed)
Pt informed RN she feels "hot and dizzy" after shower.  Vitals normal for pt.  RN contacted resident for CBG order.  Pt blood sugar 127mg /dL.  Pt now eating sandwich and reports she thinks it's "just a panic attack" and requesting her PRN Klonopin by name.  Klonopin administered, RN offers to stay with pt if she felt panicky.  Pt declines wants to be alone.  Informed pt to call out for RN if needed.

## 2019-08-14 NOTE — Discharge Summary (Addendum)
Postpartum Discharge Summary     Patient Name: YAREXI PAWLICKI DOB: 12-11-1990 MRN: [id number]  Date of admission: 08/13/2019 Delivering Provider: Chauncey Mann   Date of discharge: 08/15/2019  Admitting diagnosis: Indication for care in labor or delivery [O75.9] Intrauterine pregnancy: [redacted]w[redacted]d    Secondary diagnosis:  Active Problems:   Rh negative state in antepartum period   History of alcohol abuse   Depression with anxiety   Hallucinations   History of suicidal ideation   History of illicit drug use   History of gestational diabetes   Short interval between pregnancies affecting pregnancy, antepartum   Gestational diabetes mellitus (GDM) requiring insulin   Genital herpes affecting pregnancy, antepartum   Indication for care in labor or delivery  Additional problems: None     Discharge diagnosis: Term Pregnancy Delivered and GDM A2                                                                                                Post partum procedures: none  Augmentation: AROM, Cytotec and Foley Balloon  Complications: None  Hospital course:  Induction of Labor With Vaginal Delivery   29y.o. yo [id number] 373w5das admitted to the hospital 08/13/2019 for induction of labor.  Indication for induction:  non-compliant GDMA2 (not taking Glyburide, only Lantus) .  Patient had an uncomplicated labor course as follows: Initial SVE: 1.5/thick/-3. Patient received Cytotec and Foley balloon. AROM performed. She then progressed to complete.  Membrane Rupture Time/Date: 7:23 PM ,08/13/2019   Intrapartum Procedures: Episiotomy: None [1]                                         Lacerations:  None [1]  Patient had delivery of a Viable infant.  Information for the patient's newborn:  TaSimisola, Sandles0[[id number]]Delivery Method: Vag-Spont    08/14/2019  Details of delivery can be found in separate delivery note. Opted for Paragard placement outpatient. SW consulted for social  issues and hx of anxiety/depression/SI and determined no barriers to discharge. Fasting AM glucose 97. Patient had a routine postpartum course. Patient is discharged home 08/15/19. Delivery time: 12:40 AM    Magnesium Sulfate received: No BMZ received: No Rhophylac:No MMR:No Transfusion:No  Physical exam  Vitals:   08/14/19 1633 08/14/19 1815 08/14/19 2203 08/15/19 0539  BP: 103/68 118/77 113/68 102/72  Pulse: 91 93 94 90  Resp:  _0 Temp: 98.1 F (36.7 C) 98 F (36.7 C) 97.7 F (36.5 C) 98.2 F (36.8 C)  TempSrc: Oral Oral Oral Oral  SpO2: 98%  100% 99%  Weight:      Height:       Exam per Dr. SpDarene LamerGeneral: alert, cooperative and no distress Lochia: appropriate Uterine Fundus: firm DVT Evaluation: No evidence of DVT seen on physical exam. Peripheral pulses intact bilaterally.  Labs: Lab Results  Component Value Date   WBC 7.3 08/13/2019   HGB 10.0 (L) 08/13/2019   HCT 30.9 (  L) 08/13/2019   MCV 86.6 08/13/2019   PLT 214 08/13/2019   CMP Latest Ref Rng & Units 05/07/2019  Glucose 70 - 99 mg/dL 88  BUN 6 - 20 mg/dL 10  Creatinine 0.44 - 1.00 mg/dL 0.42(L)  Sodium 135 - 145 mmol/L 135  Potassium 3.5 - 5.1 mmol/L 4.3  Chloride 98 - 111 mmol/L 105  CO2 22 - 32 mmol/L 21(L)  Calcium 8.9 - 10.3 mg/dL 8.7(L)  Total Protein 6.5 - 8.1 g/dL -  Total Bilirubin 0.3 - 1.2 mg/dL -  Alkaline Phos 38 - 126 U/L -  AST 15 - 41 U/L -  ALT 0 - 44 U/L -   Edinburgh Score: Edinburgh Postnatal Depression Scale Screening Tool 08/15/2019  I have been able to laugh and see the funny side of things. 0  I have looked forward with enjoyment to things. 0  I have blamed myself unnecessarily when things went wrong. 0  I have been anxious or worried for no good reason. 1  I have felt scared or panicky for no good reason. 1  Things have been getting on top of me. 0  I have been so unhappy that I have had difficulty sleeping. 0  I have felt sad or miserable. 0  I have been so  unhappy that I have been crying. 0  The thought of harming myself has occurred to me. 0  Edinburgh Postnatal Depression Scale Total 2    Discharge instruction: per After Visit Summary and "Baby and Me Booklet".  After visit meds:  Allergies as of 08/15/2019       Reactions   Metronidazole Shortness Of Breath   Pt states was her anxiety, not really sob/anaphylaxis   Tramadol Other (See Comments)   "CAUSES REALLY BAD MOOD SWINGS AND HALLUCINATIONS"   Toradol [ketorolac Tromethamine] Other (See Comments)   Hallucinations        Medication List     STOP taking these medications    Accu-Chek Guide Me w/Device Kit   Accu-Chek Guide test strip Generic drug: glucose blood   Accu-Chek Softclix Lancets lancets   glyBURIDE 5 MG tablet Commonly known as: DIABETA   insulin glargine 100 UNIT/ML Solostar Pen Commonly known as: LANTUS   pantoprazole 20 MG tablet Commonly known as: PROTONIX       TAKE these medications    acetaminophen 325 MG tablet Commonly known as: TYLENOL Take 650 mg by mouth every 6 (six) hours as needed.   acyclovir 400 MG tablet Commonly known as: ZOVIRAX Take 1 tablet (400 mg total) by mouth 3 (three) times daily.   clonazePAM 1 MG tablet Commonly known as: KLONOPIN 1 three times daily as needed        Diet: routine diet  Activity: Advance as tolerated. Pelvic rest for 6 weeks.   Outpatient follow up:4 weeks Follow up Appt: Future Appointments  Date Time Provider [place]  09/17/2019  3:30 PM Roma Schanz, CNM [facility]  09/18/2019  3:30 PM Gertie Exon Royetta Crochet, CNM [facility]   Follow up Visit:     Please schedule this patient for Postpartum visit in: 4 weeks with the following provider: Any provider In-Person For C/S patients schedule nurse incision check in weeks 2 weeks: no High risk pregnancy complicated by: GDM Delivery mode:  SVD Anticipated Birth Control:   Paragard placement outpt PP Procedures  needed:  GTT, Paragard placement   Schedule Integrated BH visit: yes     Newborn Data: Live born  female  Birth Weight: 3440g   APGAR: 57, 9  Newborn Delivery   Birth date/time: 08/14/2019 00:40:00 Delivery type: Vaginal, Spontaneous      Baby Feeding: Bottle Disposition:home with mother   08/15/2019 Lurline Del, DO  GME ATTESTATION:  I saw and evaluated the patient. I agree with the findings and the plan of care as documented in the resident's note.  Merilyn Baba, DO OB Fellow, [place] for [place] 08/20/2019 12:14 PM

## 2019-08-15 NOTE — Discharge Instructions (Signed)

## 2019-08-15 NOTE — Progress Notes (Addendum)
Post Partum Day 1 Subjective: up ad lib, voiding, tolerating PO and + flatus. Patient reports feeling "crampy" in her abdomen. Patient requested/given Klonopin overnight by RN after panic attack.  Objective: Blood pressure 102/72, pulse 90, temperature 98.2 F (36.8 C), temperature source Oral, resp. rate 18, height 5\' 3"  (1.6 m), weight 77.1 kg, last menstrual period 11/08/2018, SpO2 99 %, unknown if currently breastfeeding.  Physical Exam:  General: alert, cooperative and no distress Lochia: appropriate Uterine Fundus: firm DVT Evaluation: No evidence of DVT seen on physical exam. Peripheral pulses intact bilaterally.  Recent Labs    08/13/19 1606  HGB 10.0*  HCT 30.9*    Assessment/Plan: Continue routine postpartum care.  Discharge pending Social Work Consult Contraception: Paragard in office Feeding: bottle   LOS: 2 days   Patricia Richards 08/15/2019, 7:43 AM   GME ATTESTATION:  I saw and evaluated the patient. I agree with the findings and the plan of care as documented in the resident's note.  Merilyn Baba, DO OB Fellow, [place] for [place] 08/15/2019 7:55 AM

## 2019-08-15 NOTE — Clinical Social Work Maternal (Signed)
CLINICAL SOCIAL WORK MATERNAL/CHILD NOTE  Patient Details  Name: Patricia Richards MRN: [id number] Date of Birth: 08/14/2017  Date:  08/15/2019  Clinical Social Worker Initiating Note:  Audre Cenci Date/Time: Initiated:  08/15/19/0932     Child's Name:  Patricia Richards   Biological Parents:  Mother, Father(Patricia Richards DOB: 04/02/1996)   Need for Interpreter:  None   Reason for Referral:  Behavioral Health Concerns, Current Substance Use/Substance Use During Pregnancy , Homelessness   Address:  [address] [place] [address] ([name]'s mailing address)  Currently staying with a friend at [address] [place], [place] [address]   Phone number:  [phone] (home)     Additional phone number:   Household Members/Support Persons (HM/SP):   Household Member/Support Person 1, Household Member/Support Person 2, Household Member/Support Person 3, Household Member/Support Person 4, Household Member/Support Person 5, Household Member/Support Person 6, Household Member/Support Person 7   HM/SP Name Relationship DOB or Age  HM/SP -1 Patricia Richards FOB 04/02/1996  HM/SP -2 Patricia Richards Jr. Son 12/18/2013  HM/SP -3 Patricia Richards Son 08/26/2017  HM/SP -4 Patricia Richards Daughter 09/19/2018  HM/SP -5 Patricia Richards Son (lives with [name]'s father) 05/28/2010  HM/SP -6 Patricia Richards Son (lives with [name]'s mother) 11/21/2015  HM/SP -7   Friend    HM/SP -8          Natural Supports (not living in the home):  Friends, Immediate Family, Spouse/significant other, Parent   Professional Supports: (Intends to reconnect with therapist through [facility])   Employment: Unemployed   Type of Work:     Education:  Other (comment)(8th grade)   Homebound arranged:    Financial Resources:  Medicaid   Other Resources:  Food Stamps , WIC   Cultural/Religious Considerations Which May Impact Care:    Strengths:  Ability to meet basic needs , Home prepared for child ,  Psychotropic Medications, Pediatrician chosen   Psychotropic Medications:  Klonopin      Pediatrician:    ([facility])  Pediatrician List:   [place]    [place]    [place]    [place]    [place]    [place]      Pediatrician Fax Number:    Risk Factors/Current Problems:  Mental Health Concerns , Substance Use    Cognitive State:  Able to Concentrate , Alert , Linear Thinking    Mood/Affect:  Calm , Comfortable , Interested , Relaxed    CSW Assessment:  CSW received consult for history of anxiety, depression, bipolar disorder, history of SI, panic attacks and homelessness. CSW met with [name] to offer support and complete assessment.    [name] resting in bed with FOB present at bedside and infant sleep in bassinet, when CSW entered the room. CSW familiar with [name] and FOB from previous delivery. FOB appeared to be sleeping so CSW offered to come back at a later time. FOB reported he wasn't asleep and voluntarily left the room so that CSW could meet with [name] in private. CSW reintroduced self and explained reason for visit to which [name] expressed understanding. [name] reported she and FOB are currently living with a friend and three of their children. [name] stated she and FOB recently applied for an apartment in [place] and are awaiting to be approved. [name] hopeful to move in the next couple of weeks. [name] reported oldest son still lives with [name]'s father, Patricia Richards [phone], who has adopted him. [name] stated   3-year-old son lives with [name]'s mother who has temporary custody but is hopeful to have him move back in once [name] is settled. Per [name], CPS not involved in placement of either child. [name] stated she has full custody of her other three children. CSW inquired about [name]'s mental health since last child was born. Per [name], this pregnancy was a lot better than the last as far as her depression goes. [name] stated she always feels high  anxiety but that is normal for her. [name] currently taking Klonopin, prescribed through her OBGYN, to help with symptoms and stated that is the only thing that helps. Per [name], OBGYN is giving her a 2 month supply until she can get back in with her therapist at [facility]. [name] hesitant as therapist would like to wean her off of the Klonopin and [name] does not feel ready. [name] reported currently feeling good and is eager to go home. [name] denied any PPD/A following last pregnancy. CSW provided education regarding the baby blues period vs. perinatal mood disorders, discussed treatment and gave resources for mental health follow up if concerns arise.  CSW recommends self-evaluation during the postpartum time period using the New Mom Checklist from Postpartum Progress and encouraged [name] to contact a medical professional if symptoms are noted at any time. [name] did not appear to be displaying any acute mental health symptoms and denied any current SI, HI or DV. [name] reported having good support from a friend, FOB, her dad, and FOB's grandmother. [name] confirmed having all essential items for infant once discharged and stated infant would be sleeping in a bassinet once home. CSW provided review of Sudden Infant Death Syndrome (SIDS) precautions and safe sleeping habits.    CSW inquired about [name]'s alcohol use during pregnancy to which [name] acknowledged drinking alcohol for her birthday and prior to finding out she was pregnant. CSW aware it is noted in chart that [name] was considering entering rehab for her alcohol use after she delivered. CSW addressed this with [name] to which [name] reported she found a friend who has been helping "keep her sane" and holding her accountable. [name] does not feel her alcohol use is a problem at this time but is aware of resources in the event she needs them. [name] denied any other substance use during pregnancy and reported she has been clean for the last 3 years. CSW aware [name] had positive UDS  for benzos during pregnancy. [name] currently has prescription for Klonopin prescribed by OBGYN. CSW also addressed transportation concerns to which [name] denied having any. Per [name], she lives within walking distance of pediatrician.   CSW to continue to monitor CDS and make CPS report, if warranted.   CSW Plan/Description:  No Further Intervention Required/No Barriers to Discharge, Sudden Infant Death Syndrome (SIDS) Education, Perinatal Mood and Anxiety Disorder (PMADs) Education, Hospital Drug Screen Policy Information, CSW Will Continue to Monitor Umbilical Cord Tissue Drug Screen Results and Make Report if Warranted    Leonce Bale, LCSW 08/15/2019, 10:27 AM  

## 2019-08-16 ENCOUNTER — Other Ambulatory Visit: Payer: Medicaid Other

## 2019-09-17 ENCOUNTER — Ambulatory Visit: Payer: Medicaid Other | Admitting: Women's Health

## 2019-09-18 ENCOUNTER — Ambulatory Visit: Payer: Medicaid Other | Admitting: Women's Health

## 2019-09-25 ENCOUNTER — Ambulatory Visit: Payer: Medicaid Other | Admitting: Women's Health

## 2019-09-26 ENCOUNTER — Ambulatory Visit: Payer: Medicaid Other | Admitting: Advanced Practice Midwife

## 2019-09-27 ENCOUNTER — Ambulatory Visit: Payer: Medicaid Other | Admitting: Women's Health

## 2019-09-28 ENCOUNTER — Ambulatory Visit: Payer: Medicaid Other | Admitting: Women's Health

## 2019-10-01 ENCOUNTER — Telehealth: Payer: Self-pay | Admitting: Women's Health

## 2019-10-01 NOTE — Telephone Encounter (Signed)

## 2019-10-02 ENCOUNTER — Encounter: Payer: Self-pay | Admitting: Women's Health

## 2019-10-02 ENCOUNTER — Ambulatory Visit (INDEPENDENT_AMBULATORY_CARE_PROVIDER_SITE_OTHER): Payer: Medicaid Other | Admitting: Women's Health

## 2019-10-02 DIAGNOSIS — F418 Other specified anxiety disorders: Secondary | ICD-10-CM

## 2019-10-02 DIAGNOSIS — Z3202 Encounter for pregnancy test, result negative: Secondary | ICD-10-CM | POA: Diagnosis not present

## 2019-10-02 DIAGNOSIS — R42 Dizziness and giddiness: Secondary | ICD-10-CM | POA: Diagnosis not present

## 2019-10-02 DIAGNOSIS — Z634 Disappearance and death of family member: Secondary | ICD-10-CM

## 2019-10-02 LAB — POCT URINE PREGNANCY: Preg Test, Ur: NEGATIVE

## 2019-10-02 LAB — POCT HEMOGLOBIN: Hemoglobin: 11.2 g/dL (ref 11–14.6)

## 2019-10-02 MED ORDER — MECLIZINE HCL 12.5 MG PO TABS: 12.5000 mg | ORAL_TABLET | Freq: Two times a day (BID) | ORAL | 0 refills | Status: DC | PRN

## 2019-10-02 NOTE — Progress Notes (Signed)
POSTPARTUM VISIT Patient name: BRYNNLEY DAYRIT MRN [id number]  Date of birth: 06-Feb-1991 Chief Complaint:   Postpartum Care  History of Present Illness:   Patricia Richards is a 29 y.o. [phone] Caucasian female being seen today for a postpartum visit. She is 7 weeks postpartum following a spontaneous vaginal delivery at 38.5 gestational weeks after IOL for A2DM noncompliant on insulin. Anesthesia: none.  I have fully reviewed the prenatal and intrapartum course. 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 Postpartum course has been complicated by death of her husband 71mh ago via motorcycle accident, her and her 529yoson are the ones that found/saw him. Has been drinking mainly on weekends to help cope/grieve. Has good support, her friend and some of her family. Reports vertigo daily, worse when drinks, but happens even when not drinking.  Applying for a job at [employer] scare it may affect her ability to work.  Bleeding no bleeding. Does report discharge he wants checked out.  Bowel function is normal. Bladder function is normal.  Patient is sexually active. Last sexual activity: ~175m ago right before husband died. Has had period since and -HPTs.   Patient's last menstrual period was 09/24/2019 (exact date).  Baby's course has been uncomplicated. Baby is feeding by bottle    EdFlavia Shipperostpartum Depression Screening: does have h/o dep/anx, [place] her [place]. On Klonopin only. Declines antidepressants- states she has tried multiple in the past and didn't like the way they made her feel. Denies SI/HI/II. Has good support.  Edinburgh Postnatal Depression Scale - 10/02/19 1356      Edinburgh Postnatal Depression Scale:  In the Past 7 Days   I have been able to laugh and see the funny side of things.  1    I have looked forward with enjoyment to things.  1    I have blamed myself unnecessarily when things went wrong.  0    I have  been anxious or worried for no good reason.  3    I have felt scared or panicky for no good reason.  3    Things have been getting on top of me.  1    I have been so unhappy that I have had difficulty sleeping.  0    I have felt sad or miserable.  0    I have been so unhappy that I have been crying.  3    The thought of harming myself has occurred to me.  0    Edinburgh Postnatal Depression Scale Total  12      Review of Systems:   Pertinent items are noted in HPI Denies Abnormal vaginal discharge w/ itching/odor/irritation, headaches, visual changes, shortness of breath, chest pain, abdominal pain, severe nausea/vomiting, or problems with urination or bowel movements. Pertinent History Reviewed:  Reviewed past medical,surgical, obstetrical and family history.  Reviewed problem list, medications and allergies. OB History  Gravida Para Term Preterm AB Living  '7 6 6 ' 0 1 6  SAB TAB Ectopic Multiple Live Births  0 1 0 0 6    # Outcome Date GA Lbr Len/2nd Weight Sex Delivery Anes PTL Lv  7 Term 08/14/19 3837w5d:12 / 00:05 7 lb 9.3 oz (3.44 kg) M Vag-Spont None  LIV  6 Term 09/19/18 39w80w0d55 / 00:09 7 lb 13 oz (3.544 kg) F Vag-Spont EPI  LIV  5 Term 08/26/17 40w230w2d7 / 00:15 8 lb 12.2 oz (  3.975 kg) M Vag-Spont EPI N LIV  4 Term 11/21/15 [redacted]w[redacted]d 8 lb 13 oz (3.997 kg) M Vag-Spont EPI N LIV  3 Term 12/18/13 351w2d4:05 / 01:03 7 lb 9 oz (3.43 kg) M Vag-Spont EPI N LIV     Birth Comments: caput  2 Term 05/28/10 3952w0d lb 7 oz (3.827 kg) M Vag-Vacuum EPI N LIV     Birth Comments: states induced early due to PUPPS  1 TAB 2012           Physical Assessment:   Vitals:   10/02/19 1354  BP: 139/86  Pulse: 92  Weight: 152 lb (68.9 kg)  Height: '5\' 3"'  (1.6 m)  Body mass index is 26.93 kg/m.       Physical Examination:   General appearance: appears sad, teary  Mental status: alert, oriented to person, place, and time  Skin: warm & dry   Cardiovascular: normal heart rate noted    Respiratory: normal respiratory effort, no distress   Breasts: deferred, no complaints   Abdomen: soft, non-tender   Pelvic: VULVA: normal appearing vulva with no masses, tenderness or lesions, VAGINA: normal appearing vagina with normal color and discharge, no lesions, CERVIX: normal appearing cervix without discharge or lesions  Rectal: no hemorrhoids  Extremities: no edema       Results for orders placed or performed in visit on 10/02/19 (from the past 24 hour(s))  POCT urine pregnancy   Collection Time: 10/02/19  2:00 PM  Result Value Ref Range   Preg Test, Ur Negative Negative  POCT hemoglobin   Collection Time: 10/02/19  2:44 PM  Result Value Ref Range   Hemoglobin 11.2 11 - 14.6 g/dL    Assessment & Plan:  1) Postpartum exam 2) 7 wks s/p SVB after IOL for A2DM 3) Bottlefeeding 4) Depression screening 5) Contraception counseling 6) Recent loss of husband 7) H/O drug and ETOH abuse 8) Dep/anx 9) Vertigo>hgb 11.2, rx antivert prn  Essential components of care per ACOG recommendations:  1.  Mood and well being:  . Patient with positive depression screening today. Already has appt w/ counselor for this week for her and her son. On Klonopin. Declines antidepressants, has tried many in past and didn't like side effects/way they made her feel. Reviewed local resources for support including [facility].  . Pre-existing mental health disorders? Yes  . Patient does not use tobacco. . Substance use disorder? H/o drug use- clean for years- states she was actually offered something recently and flushed it down the toilet. Does have h/o heavy etoh use after last baby. Has started drinking again, mainly on weekends to help grieve/numb loss of her husband.  2. Infant care and feeding:  . Patient currently breastfeeding? No  . Childcare strategy if returning to work/school: friend and mother-in-law . Infant has a pediatrician/family doctor? Yes  . Recommended that all caregivers be  immunized for flu, pertussis and other preventable communicable diseases . Pt does have material needs met such as stable housing, utilities, food and diapers. If not, referred to local resources for help obtaining these.  3. Sexuality, contraception and birth spacing . Provided guidance regarding sexuality, management of dyspareunia, and resumption of intercourse . Patient does not want a pregnancy in the future.  Desired family size is 6 children.  . Discussed avoiding interpregnancy interval <6mt12mand recommended birth spacing of 18 months . Reviewed forms of contraception. Patient desires IUD.    4. Sleep and fatigue . Discussed coping  options for fatigue and sleep disruption . Encouraged family/partner/community support of 4 hrs of uninterrupted sleep to help with mood and fatigue  5. Physical recovery  . Pt did not have a cesarean section. . Patient had a none laceration  . Patient has urinary incontinence? No, fecal incontinence? No . Patient is safe to resume physical activity. Discussed attainment of healthy weight.  6.  Chronic disease management . Discussed pregnancy complications if any, and their implications for future childbearing and long-term maternal health. . Review recommendations for prevention of recurrent pregnancy complications, such as 17 hydroxyprogesterone caproate to reduce risk for recurrent PTB not applicable, or aspirin to reduce risk of preeclampsia not applicable. . Pt had GDM: Yes. If yes, 2hr GTT scheduled: Yes. . Reviewed medications and non-pregnant dosing including consideration of whether pt is breastfeeding using a reliable resource such as LactMed: not applicable . Referred for f/u w/ PCP or subspecialist providers as indicated: not applicable  7. Health maintenance . Last pap smear 02/13/19 and results were normal . Mammogram at 29yo or earlier if indicated  Meds:  Meds ordered this encounter  Medications  . meclizine (ANTIVERT) 12.5 MG  tablet    Sig: Take 1 tablet (12.5 mg total) by mouth 2 (two) times daily as needed for dizziness.    Dispense:  30 tablet    Refill:  0    Order Specific Question:   Supervising Provider    Answer:   Tania Ade H [[id number]]    Follow-up: Return for next week AM IUD Insertion and 2hr sugar test.   Orders Placed This Encounter  Procedures  . POCT urine pregnancy  . POCT hemoglobin    Roma Schanz CNM, [facility] 10/02/2019 2:14pm

## 2019-10-02 NOTE — Patient Instructions (Signed)
You will have your sugar test next visit.  Please do not eat or drink anything after midnight the night before you come, not even water.  You will be here for at least two hours.  Please make an appointment online for the bloodwork at [email or web address] for 8:30am (or as close to this as possible). Make sure you select the [facility] service center. The day of the appointment, check in with our office first, then you will go to [place] to start the sugar test.    [facility] on Monday nights @ [place] Tuesday nights @ 6:30pm online

## 2019-10-12 ENCOUNTER — Encounter: Payer: Self-pay | Admitting: Women's Health

## 2019-10-12 ENCOUNTER — Ambulatory Visit (INDEPENDENT_AMBULATORY_CARE_PROVIDER_SITE_OTHER): Payer: Medicaid Other | Admitting: Women's Health

## 2019-10-12 ENCOUNTER — Other Ambulatory Visit: Payer: Medicaid Other

## 2019-10-12 VITALS — BP 124/83 | HR 93 | Ht 63.0 in | Wt 156.0 lb

## 2019-10-12 DIAGNOSIS — Z8632 Personal history of gestational diabetes: Secondary | ICD-10-CM

## 2019-10-12 DIAGNOSIS — Z30430 Encounter for insertion of intrauterine contraceptive device: Secondary | ICD-10-CM | POA: Insufficient documentation

## 2019-10-12 DIAGNOSIS — Z3202 Encounter for pregnancy test, result negative: Secondary | ICD-10-CM

## 2019-10-12 LAB — POCT URINE PREGNANCY: Preg Test, Ur: NEGATIVE

## 2019-10-12 MED ORDER — PARAGARD INTRAUTERINE COPPER IU IUD: INTRAUTERINE_SYSTEM | Freq: Once | INTRAUTERINE | Status: DC

## 2019-10-12 NOTE — Progress Notes (Signed)
Pt originally here for IUD insertion, however she decided against having it placed today. Husband passed away in Sep 22, 2022, she is not sexually active and does not plan to be anytime soon. She will call us back if she decides to get IUD. Doing 2hr GTT today d/t recent GDM.  Roma Schanz, CNM, [facility] 10/12/2019 11:42 AM

## 2019-10-13 ENCOUNTER — Other Ambulatory Visit: Payer: Self-pay | Admitting: Women's Health

## 2019-10-13 DIAGNOSIS — R7302 Impaired glucose tolerance (oral): Secondary | ICD-10-CM

## 2019-10-13 LAB — GLUCOSE TOLERANCE, 2 HOURS W/ 1HR
Glucose, 1 hour: 188 mg/dL — ABNORMAL HIGH (ref 65–179)
Glucose, 2 hour: 130 mg/dL (ref 65–152)
Glucose, Fasting: 87 mg/dL (ref 65–91)

## 2020-05-19 ENCOUNTER — Other Ambulatory Visit: Payer: Self-pay

## 2020-05-19 ENCOUNTER — Encounter (HOSPITAL_COMMUNITY): Payer: Self-pay | Admitting: Emergency Medicine

## 2020-05-19 ENCOUNTER — Emergency Department (HOSPITAL_COMMUNITY)
Admission: EM | Admit: 2020-05-19 | Discharge: 2020-05-19 | Disposition: A | Discharge status: Home / Self Care | Payer: Medicaid Other | Attending: Emergency Medicine | Admitting: Emergency Medicine

## 2020-05-19 DIAGNOSIS — U071 COVID-19: Secondary | ICD-10-CM | POA: Insufficient documentation

## 2020-05-19 DIAGNOSIS — Z5321 Procedure and treatment not carried out due to patient leaving prior to being seen by health care provider: Secondary | ICD-10-CM | POA: Insufficient documentation

## 2020-05-19 DIAGNOSIS — Z1152 Encounter for screening for COVID-19: Secondary | ICD-10-CM | POA: Diagnosis present

## 2020-05-19 NOTE — ED Notes (Signed)
Pt LWBS 

## 2020-05-19 NOTE — ED Triage Notes (Signed)
Pt requesting to get covid test due to exposure.

## 2020-05-20 LAB — SARS CORONAVIRUS 2 (TAT 6-24 HRS): SARS Coronavirus 2: POSITIVE — AB

## 2020-05-21 ENCOUNTER — Telehealth: Payer: Self-pay | Admitting: Physician Assistant

## 2020-05-21 NOTE — Telephone Encounter (Signed)
Called to discuss with patient about COVID-19 symptoms and the use of one of the available treatments for those with mild to moderate Covid symptoms and at a high risk of hospitalization.  Pt appears to qualify for outpatient treatment due to co-morbid conditions and/or a member of an at-risk group in accordance with the FDA Emergency Use Authorization.    Unable to reach pt. No voice mail.   [employer]

## 2020-11-11 ENCOUNTER — Ambulatory Visit (INDEPENDENT_AMBULATORY_CARE_PROVIDER_SITE_OTHER): Payer: Medicaid Other | Admitting: *Deleted

## 2020-11-11 ENCOUNTER — Other Ambulatory Visit: Payer: Self-pay

## 2020-11-11 ENCOUNTER — Encounter: Payer: Self-pay | Admitting: *Deleted

## 2020-11-11 VITALS — BP 120/79 | HR 99

## 2020-11-11 DIAGNOSIS — Z3201 Encounter for pregnancy test, result positive: Secondary | ICD-10-CM

## 2020-11-11 LAB — POCT URINE PREGNANCY: Preg Test, Ur: POSITIVE — AB

## 2020-11-11 NOTE — Progress Notes (Signed)
Chart reviewed for nurse visit. Agree with plan of care.  Estill Dooms, NP 11/11/2020 12:48 PM

## 2020-11-11 NOTE — Progress Notes (Signed)
   NURSE VISIT- PREGNANCY CONFIRMATION   SUBJECTIVE:  Patricia Richards is a 30 y.o. [id number] female at [redacted]w[redacted]d by certain LMP of Patient's last menstrual period was 10/09/2020 (exact date). Here for pregnancy confirmation.  Home pregnancy test: positive x 9   She reports cramping.  She is taking prenatal vitamins.    OBJECTIVE:  BP 120/79 (BP Location: Left Arm, Patient Position: Sitting, Cuff Size: Normal)   Pulse 99   LMP 10/09/2020 (Exact Date)   Breastfeeding No   Appears well, in no apparent distress  Results for orders placed or performed in visit on 11/11/20 (from the past 24 hour(s))  POCT urine pregnancy   Collection Time: 11/11/20 11:36 AM  Result Value Ref Range   Preg Test, Ur Positive (A) Negative    ASSESSMENT: Positive pregnancy test, [redacted]w[redacted]d by LMP    PLAN: Schedule for dating ultrasound in 2-3 weeks Prenatal vitamins: continue   Nausea medicines: not currently needed   OB packet given: Yes  Janece Canterbury  11/11/2020 11:39 AM

## 2020-11-18 ENCOUNTER — Telehealth: Payer: Self-pay | Admitting: Women's Health

## 2020-11-18 NOTE — Telephone Encounter (Signed)
Pt called, concerned with feeling faint last night while using the bathroom & having lower abdominal & hip pain today, no bleeding States she's 5-[redacted]wks pregnant  (Dating U/S is scheduled here for 7/19)  Please advise & notify pt

## 2020-11-18 NOTE — Telephone Encounter (Signed)
Patient states this starting this morning she has had a dull pain in her right hip area.   Not "excruciating" but feels it.  Denies bleeding.  Discussed with JAG and sine we do not have a confirmed IUP, we need to r/o ectopic.  Will either need to go to MAU for labs and possible u/s or can come in the am for stat HCG and plan of care will be determined then.  Pt states she is unsure what she will do as she has transportation issues but will let us know in the morning.

## 2020-11-19 ENCOUNTER — Other Ambulatory Visit: Payer: Medicaid Other

## 2020-11-19 DIAGNOSIS — Z3490 Encounter for supervision of normal pregnancy, unspecified, unspecified trimester: Secondary | ICD-10-CM

## 2020-11-19 DIAGNOSIS — R109 Unspecified abdominal pain: Secondary | ICD-10-CM

## 2020-11-20 ENCOUNTER — Inpatient Hospital Stay (HOSPITAL_COMMUNITY): Payer: Medicaid Other

## 2020-11-20 ENCOUNTER — Inpatient Hospital Stay (HOSPITAL_COMMUNITY)
Admission: AD | Admit: 2020-11-20 | Discharge: 2020-11-20 | Disposition: A | Discharge status: Home / Self Care | Payer: Medicaid Other | Attending: Obstetrics and Gynecology | Admitting: Obstetrics and Gynecology

## 2020-11-20 ENCOUNTER — Encounter (HOSPITAL_COMMUNITY): Payer: Self-pay | Admitting: Obstetrics and Gynecology

## 2020-11-20 ENCOUNTER — Telehealth: Payer: Self-pay | Admitting: Adult Health

## 2020-11-20 DIAGNOSIS — M545 Low back pain, unspecified: Secondary | ICD-10-CM | POA: Insufficient documentation

## 2020-11-20 DIAGNOSIS — Z885 Allergy status to narcotic agent status: Secondary | ICD-10-CM | POA: Insufficient documentation

## 2020-11-20 DIAGNOSIS — O468X1 Other antepartum hemorrhage, first trimester: Secondary | ICD-10-CM | POA: Diagnosis not present

## 2020-11-20 DIAGNOSIS — O26891 Other specified pregnancy related conditions, first trimester: Secondary | ICD-10-CM | POA: Diagnosis not present

## 2020-11-20 DIAGNOSIS — Z3A01 Less than 8 weeks gestation of pregnancy: Secondary | ICD-10-CM | POA: Diagnosis not present

## 2020-11-20 DIAGNOSIS — Z888 Allergy status to other drugs, medicaments and biological substances status: Secondary | ICD-10-CM | POA: Insufficient documentation

## 2020-11-20 DIAGNOSIS — Z87891 Personal history of nicotine dependence: Secondary | ICD-10-CM | POA: Insufficient documentation

## 2020-11-20 DIAGNOSIS — R110 Nausea: Secondary | ICD-10-CM | POA: Insufficient documentation

## 2020-11-20 DIAGNOSIS — Z881 Allergy status to other antibiotic agents status: Secondary | ICD-10-CM | POA: Diagnosis not present

## 2020-11-20 DIAGNOSIS — O418X10 Other specified disorders of amniotic fluid and membranes, first trimester, not applicable or unspecified: Secondary | ICD-10-CM | POA: Diagnosis not present

## 2020-11-20 DIAGNOSIS — R109 Unspecified abdominal pain: Secondary | ICD-10-CM | POA: Insufficient documentation

## 2020-11-20 DIAGNOSIS — Z3491 Encounter for supervision of normal pregnancy, unspecified, first trimester: Secondary | ICD-10-CM

## 2020-11-20 HISTORY — DX: Tachycardia, unspecified: R00.0

## 2020-11-20 HISTORY — DX: Other chronic pain: G89.29

## 2020-11-20 LAB — URINALYSIS, ROUTINE W REFLEX MICROSCOPIC
Bilirubin Urine: NEGATIVE
Glucose, UA: NEGATIVE mg/dL
Hgb urine dipstick: NEGATIVE
Ketones, ur: NEGATIVE mg/dL
Leukocytes,Ua: NEGATIVE
Nitrite: NEGATIVE
Protein, ur: NEGATIVE mg/dL
Specific Gravity, Urine: 1.02 (ref 1.005–1.030)
pH: 6 (ref 5.0–8.0)

## 2020-11-20 LAB — CBC
HCT: 36.3 % (ref 36.0–46.0)
Hemoglobin: 11.9 g/dL — ABNORMAL LOW (ref 12.0–15.0)
MCH: 29.2 pg (ref 26.0–34.0)
MCHC: 32.8 g/dL (ref 30.0–36.0)
MCV: 89.2 fL (ref 80.0–100.0)
Platelets: 231 10*3/uL (ref 150–400)
RBC: 4.07 MIL/uL (ref 3.87–5.11)
RDW: 13 % (ref 11.5–15.5)
WBC: 5.9 10*3/uL (ref 4.0–10.5)
nRBC: 0 % (ref 0.0–0.2)

## 2020-11-20 LAB — WET PREP, GENITAL
Clue Cells Wet Prep HPF POC: NONE SEEN
Sperm: NONE SEEN
Trich, Wet Prep: NONE SEEN
WBC, Wet Prep HPF POC: NONE SEEN
Yeast Wet Prep HPF POC: NONE SEEN

## 2020-11-20 LAB — SAMPLE TO BLOOD BANK

## 2020-11-20 LAB — HCG, QUANTITATIVE, PREGNANCY: hCG, Beta Chain, Quant, S: 13676 m[IU]/mL — ABNORMAL HIGH (ref <5)

## 2020-11-20 LAB — BETA HCG QUANT (REF LAB): hCG Quant: 5485 m[IU]/mL

## 2020-11-20 NOTE — Telephone Encounter (Signed)
Has cramps in pelvic area and hip still, dull no bleeding, pain is a 4 or 5 I told her to go to MAU to be seen to rule out ectopic

## 2020-11-20 NOTE — MAU Note (Signed)
Having pain in RLQ around to rt hip.  Pain is constant, not getting worse, not going away.  Not bleeding.   Was trying to have a BM, that was when it started, felt faint, sweaty and clammy. (Last wk).  Told her dr about it.  Had some blood work, was told to come here for an US>

## 2020-11-20 NOTE — MAU Provider Note (Signed)
History     CSN: [id number]  Arrival date and time: 11/20/20 1505   Event Date/Time   First Provider Initiated Contact with Patient 11/20/20 1550      Chief Complaint  Patient presents with   Abdominal Pain   HPI Patricia Richards is a 30 y.o. [id number] at [redacted]w[redacted]d who presents for abdominal pain. Symptoms started last week. Reports pain primarily in her right lower quadrant that is constant & wraps around to her right lower back. Has had some nausea. Rates pain 4/10. Hasn't treated symptoms. Denies fever, vomiting, dysuria, hematuria, vaginal bleeding, or vaginal discharge.   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,gets off street   Panic attacks    Pregnant    Tachycardia    Urinary tract infection    gets them frequently   Vaginal Pap smear, abnormal     Past Surgical History:  Procedure Laterality Date   DILATION AND EVACUATION  11/28/2010    Family History  Problem Relation Age of Onset   Cerebral palsy Mother    Hyperlipidemia Father    Hypertension Father    Cancer Father        skin   Mental illness Father    Diabetes Father    Other Brother        problems with gums   Cancer Maternal Grandmother    Mental illness Maternal Grandmother    Stroke Maternal Grandmother    Heart attack Paternal Grandmother    Down syndrome Cousin    Bipolar disorder Cousin     Social History   Tobacco Use   Smoking status: Former    Pack years: 0.00    Types:  Cigarettes    Quit date: 02/14/2018    Years since quitting: 2.7   Smokeless tobacco: Never  Vaping Use   Vaping Use: Some days  Substance Use Topics   Alcohol use: Not Currently    Comment: 'alcohol problem prior to preg, nothing since LMP   Drug use: Not Currently    Types: Other-see comments, Marijuana, Cocaine, MDMA (Ecstacy)    Comment: none since April 2018    Allergies:  Allergies  Allergen Reactions   Metronidazole Shortness Of Breath    Pt states was her anxiety, not really sob/anaphylaxis   Tramadol Other (See Comments)    "CAUSES REALLY BAD MOOD SWINGS AND HALLUCINATIONS"   Reglan [Metoclopramide Hcl] Other (See Comments)    hallucinations   Toradol [Ketorolac Tromethamine] Other (See Comments)    Hallucinations     Medications Prior to Admission  Medication Sig Dispense Refill Last Dose  Vitamin D, Ergocalciferol, (DRISDOL) 1.25 MG (50000 UNIT) CAPS capsule Take 50,000 Units by mouth every 7 (seven) days.   Past Week   acetaminophen (TYLENOL) 325 MG tablet Take 650 mg by mouth every 6 (six) hours as needed. (Patient not taking: No sig reported)    at 2100   acyclovir (ZOVIRAX) 400 MG tablet Take 1 tablet (400 mg total) by mouth 3 (three) times daily. 90 tablet 3    clonazePAM (KLONOPIN) 1 MG tablet 1 three times daily as needed 90 tablet 2  at 0005   meclizine (ANTIVERT) 12.5 MG tablet Take 1 tablet (12.5 mg total) by mouth 2 (two) times daily as needed for dizziness. 30 tablet 0    Prenatal Vit-Fe Fumarate-FA (MULTIVITAMIN-PRENATAL) 27-0.8 MG TABS tablet Take 1 tablet by mouth daily at 12 noon.       Review of Systems  Constitutional: Negative.   Gastrointestinal:  Positive for abdominal pain and nausea. Negative for constipation, diarrhea and vomiting.  Genitourinary: Negative.   Physical Exam   Blood pressure 115/70, pulse 94, temperature 98.1 F (36.7 C), temperature source Oral, resp. rate 16, height 5\' 2"  (1.575 m), weight 72.4 kg, last menstrual period  10/09/2020, not currently breastfeeding.  Physical Exam Vitals and nursing note reviewed.  Constitutional:      General: She is not in acute distress.    Appearance: She is well-developed. She is not ill-appearing.  HENT:     Head: Normocephalic and atraumatic.  Eyes:     General: No scleral icterus. Pulmonary:     Effort: Pulmonary effort is normal. No respiratory distress.  Abdominal:     General: Abdomen is flat. There is no distension.     Palpations: Abdomen is soft.     Tenderness: There is no abdominal tenderness. There is no right CVA tenderness, left CVA tenderness, guarding or rebound.  Skin:    General: Skin is warm and dry.  Neurological:     Mental Status: She is alert.  Psychiatric:        Mood and Affect: Mood normal.        Behavior: Behavior normal.    MAU Course  Procedures Results for orders placed or performed during the hospital encounter of 11/20/20 (from the past 24 hour(s))  Urinalysis, Routine w reflex microscopic Urine, Clean Catch     Status: Abnormal   Collection Time: 11/20/20  3:43 PM  Result Value Ref Range   Color, Urine YELLOW YELLOW   APPearance HAZY (A) CLEAR   Specific Gravity, Urine 1.020 1.005 - 1.030   pH 6.0 5.0 - 8.0   Glucose, UA NEGATIVE NEGATIVE mg/dL   Hgb urine dipstick NEGATIVE NEGATIVE   Bilirubin Urine NEGATIVE NEGATIVE   Ketones, ur NEGATIVE NEGATIVE mg/dL   Protein, ur NEGATIVE NEGATIVE mg/dL   Nitrite NEGATIVE NEGATIVE   Leukocytes,Ua NEGATIVE NEGATIVE  CBC     Status: Abnormal   Collection Time: 11/20/20  4:22 PM  Result Value Ref Range   WBC 5.9 4.0 - 10.5 K/uL   RBC 4.07 3.87 - 5.11 MIL/uL   Hemoglobin 11.9 (L) 12.0 - 15.0 g/dL   HCT 36.3 36.0 - 46.0 %   MCV 89.2 80.0 - 100.0 fL   MCH 29.2 26.0 - 34.0 pg   MCHC 32.8 30.0 - 36.0 g/dL   RDW 13.0 11.5 - 15.5 %   Platelets 231 150 - 400 K/uL   nRBC 0.0 0.0 - 0.2 %  hCG, quantitative, pregnancy     Status:  Abnormal   Collection Time: 11/20/20  4:22 PM  Result  Value Ref Range   hCG, Beta Chain, Quant, S 13,676 (H) <5 mIU/mL  Sample to Blood Bank     Status: None   Collection Time: 11/20/20  4:22 PM  Result Value Ref Range   Blood Bank Specimen SAMPLE AVAILABLE FOR TESTING    Sample Expiration      11/23/2020,2359 Performed at [facility], [place] [address], [place], [place] [address]   Wet prep, genital     Status: None   Collection Time: 11/20/20  4:37 PM   Specimen: Vaginal  Result Value Ref Range   Yeast Wet Prep HPF POC NONE SEEN NONE SEEN   Trich, Wet Prep NONE SEEN NONE SEEN   Clue Cells Wet Prep HPF POC NONE SEEN NONE SEEN   WBC, Wet Prep HPF POC NONE SEEN NONE SEEN   Sperm NONE SEEN    US OB LESS THAN 14 WEEKS WITH OB TRANSVAGINAL  Result Date: 11/20/2020 CLINICAL DATA:  Right lower quadrant pain. Gestational age by last menstrual period 6 weeks and 0 days. Last menstrual period 10/09/2020. Estimated due date by last menstrual period 07/16/2021. quantitative beta HCG 13, 676 (from 5,045 on 11/19/2020). EXAM: OBSTETRIC <14 WK [place] AND TRANSVAGINAL OB US TECHNIQUE: Both transabdominal and transvaginal ultrasound examinations were performed for complete evaluation of the gestation as well as the maternal uterus, adnexal regions, and pelvic cul-de-sac. Transvaginal technique was performed to assess early pregnancy. COMPARISON:  None. FINDINGS: Intrauterine gestational sac: Single Yolk sac:  Visualized. Embryo:  Not Visualized. Cardiac Activity: Not Visualized. MSD: 10 mm   5 w   5 d Subchorionic hemorrhage:  Large. Maternal uterus/adnexae: A corpus luteum cyst is noted within the left ovary. Otherwise bilateral ovaries are unremarkable. The remainder of the uterus is unremarkable. Other: No free pelvic fluid. IMPRESSION: 1. Probable early intrauterine gestational sac and yolk sac, but no fetal pole or cardiac activity yet visualized. Recommend follow-up quantitative B-HCG levels and follow-up US in 14 days to assess viability. This  recommendation follows SRU consensus guidelines: Diagnostic Criteria for Nonviable Pregnancy Early in the First Trimester. [employer] Med 2013; 785:8850-27. 2. Large subchorionic hemorrhage. Electronically Signed   By: Iven Finn M.D.   On: 11/20/2020 18:08    MDM +UPT UA, wet prep, GC/chlamydia, CBC, ABO/Rh, quant hCG, and [place] today to rule out ectopic pregnancy which can be life threatening.   Pt is rh negative but denies any vaginal bleeding.   Ultrasound shows IUGS with yolk sac. Large subchorionic hemorrhage. No right adnexal mass.   Normal vital signs, afebrile, no leukocytosis, and benign abdominal exam - no concern for appendicitis at this time.    Assessment and Plan   1. Abdominal pain during pregnancy in first trimester -ectopic excluded -reviewed SAB precautions & reasons to return to MAU -keep scheduled u/s with [facility] later this month  2. Normal IUP (intrauterine pregnancy) on prenatal ultrasound, first trimester   3. Subchorionic hematoma in first trimester, single or unspecified fetus  -reviewed possibility of bleeding d/t [facility] -should f/u in office or MAU for rhogam if has bleeding  4. [redacted] weeks gestation of pregnancy      Jorje Guild 11/20/2020, 3:50 PM

## 2020-11-21 LAB — GC/CHLAMYDIA PROBE AMP (~~LOC~~) NOT AT ARMC
Chlamydia: NEGATIVE
Comment: NEGATIVE
Comment: NORMAL
Neisseria Gonorrhea: NEGATIVE

## 2020-12-01 ENCOUNTER — Other Ambulatory Visit: Payer: Self-pay | Admitting: Obstetrics & Gynecology

## 2020-12-01 ENCOUNTER — Other Ambulatory Visit: Payer: Self-pay | Admitting: Adult Health

## 2020-12-01 DIAGNOSIS — O3680X0 Pregnancy with inconclusive fetal viability, not applicable or unspecified: Secondary | ICD-10-CM

## 2020-12-02 ENCOUNTER — Other Ambulatory Visit: Payer: Self-pay

## 2020-12-02 ENCOUNTER — Ambulatory Visit (INDEPENDENT_AMBULATORY_CARE_PROVIDER_SITE_OTHER): Payer: Medicaid Other

## 2020-12-02 DIAGNOSIS — O3680X0 Pregnancy with inconclusive fetal viability, not applicable or unspecified: Secondary | ICD-10-CM

## 2020-12-02 DIAGNOSIS — Z3A01 Less than 8 weeks gestation of pregnancy: Secondary | ICD-10-CM | POA: Diagnosis not present

## 2020-12-02 NOTE — Progress Notes (Signed)
[place] 7+5 wks,single IUP with YS,FHR 145 bpm,normal ovaries,subchorionic hemorrhage 2.3 x .8 x 2.7 cm,CRL 10.76 mm

## 2020-12-26 ENCOUNTER — Other Ambulatory Visit: Payer: Self-pay | Admitting: Obstetrics & Gynecology

## 2020-12-26 DIAGNOSIS — Z3682 Encounter for antenatal screening for nuchal translucency: Secondary | ICD-10-CM

## 2021-01-05 ENCOUNTER — Ambulatory Visit (INDEPENDENT_AMBULATORY_CARE_PROVIDER_SITE_OTHER): Payer: Medicaid Other

## 2021-01-05 ENCOUNTER — Ambulatory Visit: Payer: Medicaid Other | Admitting: *Deleted

## 2021-01-05 ENCOUNTER — Ambulatory Visit (INDEPENDENT_AMBULATORY_CARE_PROVIDER_SITE_OTHER): Payer: Medicaid Other | Admitting: Women's Health

## 2021-01-05 ENCOUNTER — Encounter: Payer: Self-pay | Admitting: Women's Health

## 2021-01-05 ENCOUNTER — Other Ambulatory Visit: Payer: Self-pay

## 2021-01-05 VITALS — BP 119/79 | HR 106 | Wt 160.0 lb

## 2021-01-05 DIAGNOSIS — Z6791 Unspecified blood type, Rh negative: Secondary | ICD-10-CM | POA: Insufficient documentation

## 2021-01-05 DIAGNOSIS — Z3480 Encounter for supervision of other normal pregnancy, unspecified trimester: Secondary | ICD-10-CM | POA: Diagnosis not present

## 2021-01-05 DIAGNOSIS — Z3490 Encounter for supervision of normal pregnancy, unspecified, unspecified trimester: Secondary | ICD-10-CM | POA: Insufficient documentation

## 2021-01-05 DIAGNOSIS — R8271 Bacteriuria: Secondary | ICD-10-CM

## 2021-01-05 DIAGNOSIS — Z8632 Personal history of gestational diabetes: Secondary | ICD-10-CM

## 2021-01-05 DIAGNOSIS — O99891 Other specified diseases and conditions complicating pregnancy: Secondary | ICD-10-CM

## 2021-01-05 DIAGNOSIS — Z3682 Encounter for antenatal screening for nuchal translucency: Secondary | ICD-10-CM | POA: Diagnosis not present

## 2021-01-05 DIAGNOSIS — Z3A12 12 weeks gestation of pregnancy: Secondary | ICD-10-CM

## 2021-01-05 DIAGNOSIS — Z3481 Encounter for supervision of other normal pregnancy, first trimester: Secondary | ICD-10-CM

## 2021-01-05 DIAGNOSIS — O26899 Other specified pregnancy related conditions, unspecified trimester: Secondary | ICD-10-CM | POA: Insufficient documentation

## 2021-01-05 LAB — POCT URINALYSIS DIPSTICK OB
Blood, UA: NEGATIVE
Glucose, UA: NEGATIVE
Ketones, UA: NEGATIVE
Leukocytes, UA: NEGATIVE
Nitrite, UA: NEGATIVE
POC,PROTEIN,UA: NEGATIVE

## 2021-01-05 MED ORDER — BLOOD PRESSURE MONITOR MISC: 0 refills | Status: DC

## 2021-01-05 MED ORDER — PROMETHAZINE HCL 25 MG PO TABS: 12.5000 mg | ORAL_TABLET | Freq: Four times a day (QID) | ORAL | 0 refills | Status: DC | PRN

## 2021-01-05 NOTE — Progress Notes (Signed)
INITIAL OBSTETRICAL VISIT Patient name: Patricia Richards MRN [id number]  Date of birth: 12-Nov-1990 Chief Complaint:   Initial Prenatal Visit  History of Present Illness:   Patricia Richards is a 30 y.o. [id number] Caucasian female at 61w4dby LMP c/w u/s at 7 weeks with an Estimated Date of Delivery: 07/16/21 being seen today for her initial obstetrical visit.   Patient's last menstrual period was 10/09/2020 (exact date). Her obstetrical history is significant for  EAB x 1, term SVB x 6, SAB x1, h/o GDM .  Dep/anx-on Klonopin '1mg'$  TID Today she reports  nausea, has been fine until today, wants rx to have on hand if needed. Drank liquor the other day-had a hard day, felt bad after doing it. Per her report she has issues w/ ETOH when not pregnant, but had stopped drinking w/ +PT. Promises she will not drink anymore. Has dep/anx, currently on klonopin. [facility]. Has removed herself from bad influences. Husband died in motorcycle accident A2021-05-10 and has a hard time being alone. Has a good friend who is her support person.  Last pap 02/13/19. Results were: NILM w/ HRHPV negative  [facility] 02/13/2019 07/26/2018 03/02/2018 01/27/2017  Decreased Interest 0 '2 1 1  '$ Down, Depressed, Hopeless 0 2 1 0  PHQ - 2 Score 0 '4 2 1  '$ Altered sleeping - - 1 1  Tired, decreased energy - - 2 2  Change in appetite - - 0 0  Feeling bad or failure about yourself  - - 1 2  Trouble concentrating - - 1 0  Moving slowly or fidgety/restless - - 0 1  Suicidal thoughts - - 0 0  PHQ-9 Score - - 7 7  Some recent data might be hidden    No flowsheet data found.   Review of Systems:   Pertinent items are noted in HPI Denies cramping/contractions, leakage of fluid, vaginal bleeding, abnormal vaginal discharge w/ itching/odor/irritation, headaches, visual changes, shortness of breath, chest pain, abdominal pain, severe nausea/vomiting, or problems with urination  or bowel movements unless otherwise stated above.  Pertinent History Reviewed:  Reviewed past medical,surgical, social, obstetrical and family history.  Reviewed problem list, medications and allergies. OB History  Gravida Para Term Preterm AB Living  '9 6 6 '$ 0 2 6  SAB IAB Ectopic Multiple Live Births  1 1 0 0 6    # Outcome Date GA Lbr Len/2nd Weight Sex Delivery Anes PTL Lv  9 Current           8 SAB 01/15/20     SAB     7 Term 08/14/19 349w5d5:12 / 00:05 7 lb 9.3 oz (3.44 kg) M Vag-Spont None  LIV     Complications: Gestational diabetes  6 Term 09/19/18 3945w0d:55 / 00:09 7 lb 13 oz (3.544 kg) F Vag-Spont EPI  LIV     Complications: Gestational diabetes  5 Term 08/26/17 40w12w2d27 / 00:15 8 lb 12.2 oz (3.975 kg) M Vag-Spont EPI N LIV  4 Term 11/21/15 40w116w1db 13 oz (3.997 kg) M Vag-Spont EPI N LIV  3 Term 12/18/13 79w2d31w2d / 01:03 7 lb 9 oz (3.43 kg) M Vag-Spont EPI N LIV     Birth Comments: caput  2 Term 05/28/10 [redacted]w[redacted]d 63w0d7 oz (3.827 kg) M Vag-Vacuum EPI N LIV     Birth Comments: states induced early due to [id number]  1 IAB 2012           Physical Assessment:   Vitals:   01/05/21 1415  BP: 119/79  Pulse: (!) 106  Weight: 160 lb (72.6 kg)  Body mass index is 29.26 kg/m.       Physical Examination:  General appearance - well appearing, and in no distress  Mental status - alert, oriented to person, place, and time  Psych:  She has a normal mood and affect  Skin - warm and dry, normal color, no suspicious lesions noted  Chest - effort normal, all lung fields clear to auscultation bilaterally  Heart - normal rate and regular rhythm  Abdomen - soft, nontender  Extremities:  No swelling or varicosities noted  Thin prep pap is not done   Chaperone: N/A    TODAY'S NT [place] 12+4 wks,measurements c/w dates,CRL 56.34 mm,NB present,NT 1.2 mm,normal ovaries,FHR 164 bpm  Results for orders placed or performed in visit on 01/05/21 (from the past 24 hour(s))  POC Urinalysis  Dipstick OB   Collection Time: 01/05/21  2:34 PM  Result Value Ref Range   Color, UA     Clarity, UA     Glucose, UA Negative Negative   Bilirubin, UA     Ketones, UA neg    Spec Grav, UA     Blood, UA neg    pH, UA     POC,PROTEIN,UA Negative Negative, Trace, Small (1+), Moderate (2+), Large (3+), 4+   Urobilinogen, UA     Nitrite, UA neg    Leukocytes, UA Negative Negative   Appearance     Odor      Assessment & Plan:  1) Low-Risk Pregnancy [id number] at 52w4dwith an Estimated Date of Delivery: 07/16/21   2) Initial OB visit  3) Dep/anx> on klonopin, declines IBH referral, will let me know if changes her mind  4) H/O ETOH abuse> none since +PT until the other day, had some liquor, none since. Feels bad about it and promises not to do it again.  5) H/O GDM> A1C today  6) Nausea> rx phenergan  Meds:  Meds ordered this encounter  Medications   Blood Pressure Monitor MISC    Sig: For regular home bp monitoring during pregnancy    Dispense:  1 each    Refill:  0    Z34.81 Please mail to patient   promethazine (PHENERGAN) 25 MG tablet    Sig: Take 0.5-1 tablets (12.5-25 mg total) by mouth every 6 (six) hours as needed for nausea or vomiting.    Dispense:  30 tablet    Refill:  0    Order Specific Question:   Supervising Provider    Answer:   ETania AdeH [[id number]]    Initial labs obtained Continue prenatal vitamins Reviewed n/v relief measures and warning s/s to report Reviewed recommended weight gain based on pre-gravid BMI Encouraged well-balanced diet Genetic & carrier screening discussed: requests Panorama, NT/IT, and [facility] ,  Ultrasound discussed; fetal survey: requested CHillsborocompleted> form faxed if has or is planning to apply for medicaid The nature of CE[employer]el[employer] Corporationfor W[facility]with multiple MDs and other Advanced Practice Providers was explained to patient; also emphasized that fellows, residents, and students are part of our team. Does not  have home bp cuff. Office bp cuff given: no. Rx sent: yes. Check bp weekly, let uKoreaknow if consistently >140/90.   Follow-up: Return in about 4 weeks (around 02/02/2021) [place], CNM, in person.   Orders Placed This Encounter  Procedures   Urine Culture   GC/Chlamydia Probe Amp   Integrated 1   Pain Management Screening Profile (10S)   Genetic Screening   CBC/D/Plt+RPR+Rh+ABO+RubIgG...   Hemoglobin A1c   POC Urinalysis Dipstick OB    Roma Schanz CNM, [facility] 01/05/2021 2:57 PM

## 2021-01-05 NOTE — Patient Instructions (Signed)
Patricia Richards, thank you for choosing our office today! We appreciate the opportunity to meet your healthcare needs. You may receive a short survey by mail, e-mail, or through MyChart. If you are happy with your care we would appreciate if you could take just a few minutes to complete the survey questions. We read all of your comments and take your feedback very seriously. Thank you again for choosing our office.  [facility]  [facility] ([address] [place], [place] [address]) Entrance C, located off of [address] Free 24/7 valet parking   Nausea & Vomiting Have saltine crackers or pretzels by your bed and eat a few bites before you raise your head out of bed in the morning Eat small frequent meals throughout the day instead of large meals Drink plenty of fluids throughout the day to stay hydrated, just don't drink a lot of fluids with your meals.  This can make your stomach fill up faster making you feel sick Do not brush your teeth right after you eat Products with real ginger are good for nausea, like ginger ale and ginger hard candy Make sure it says made with real ginger! Sucking on sour candy like lemon heads is also good for nausea If your prenatal vitamins make you nauseated, take them at night so you will sleep through the nausea Sea Bands If you feel like you need medicine for the nausea & vomiting please let us know If you are unable to keep any fluids or food down please let us know   Constipation Drink plenty of fluid, preferably water, throughout the day Eat foods high in fiber such as fruits, vegetables, and grains Exercise, such as walking, is a good way to keep your bowels regular Drink warm fluids, especially warm prune juice, or decaf coffee Eat a 1/2 cup of real oatmeal (not instant), 1/2 cup applesauce, and 1/2-1 cup warm prune juice every day If needed, you may take Colace (docusate sodium) stool softener  once or twice a day to help keep the stool soft.  If you still are having problems with constipation, you may take Miralax once daily as needed to help keep your bowels regular.   Home Blood Pressure Monitoring for Patients   Your provider has recommended that you check your blood pressure (BP) at least once a week at home. If you do not have a blood pressure cuff at home, one will be provided for you. Contact your provider if you have not received your monitor within 1 week.   Helpful Tips for Accurate Home Blood Pressure Checks  Don't smoke, exercise, or drink caffeine 30 minutes before checking your BP Use the restroom before checking your BP (a full bladder can raise your pressure) Relax in a comfortable upright chair Feet on the ground Left arm resting comfortably on a flat surface at the level of your heart Legs uncrossed Back supported Sit quietly and don't talk Place the cuff on your bare arm Adjust snuggly, so that only two fingertips can fit between your skin and the top of the cuff Check 2 readings separated by at least one minute Keep a log of your BP readings For a visual, please reference this diagram: http://ccnc.care/bpdiagram  Provider Name: [facility]     Phone: [phone]  Zone 1: ALL CLEAR  Continue to monitor your symptoms:  BP reading is less than 140 (top number) or less than 90 (bottom   number)  No right upper stomach pain No headaches or seeing spots No feeling nauseated or throwing up No swelling in face and hands  Zone 2: CAUTION Call your doctor's office for any of the following:  BP reading is greater than 140 (top number) or greater than 90 (bottom number)  Stomach pain under your ribs in the middle or right side Headaches or seeing spots Feeling nauseated or throwing up Swelling in face and hands  Zone 3: EMERGENCY  Seek immediate medical care if you have any of the following:  BP reading is greater than160 (top number) or greater than  110 (bottom number) Severe headaches not improving with Tylenol Serious difficulty catching your breath Any worsening symptoms from Zone 2    First Trimester of Pregnancy The first trimester of pregnancy is from week 1 until the end of week 12 (months 1 through 3). A week after a sperm fertilizes an egg, the egg will implant on the wall of the uterus. This embryo will begin to develop into a baby. Genes from you and your partner are forming the baby. The female genes determine whether the baby is a boy or a girl. At 6-8 weeks, the eyes and face are formed, and the heartbeat can be seen on ultrasound. At the end of 12 weeks, all the baby's organs are formed.  Now that you are pregnant, you will want to do everything you can to have a healthy baby. Two of the most important things are to get good prenatal care and to follow your health care provider's instructions. Prenatal care is all the medical care you receive before the baby's birth. This care will help prevent, find, and treat any problems during the pregnancy and childbirth. BODY CHANGES Your body goes through many changes during pregnancy. The changes vary from woman to woman.  You may gain or lose a couple of pounds at first. You may feel sick to your stomach (nauseous) and throw up (vomit). If the vomiting is uncontrollable, call your health care provider. You may tire easily. You may develop headaches that can be relieved by medicines approved by your health care provider. You may urinate more often. Painful urination may mean you have a bladder infection. You may develop heartburn as a result of your pregnancy. You may develop constipation because certain hormones are causing the muscles that push waste through your intestines to slow down. You may develop hemorrhoids or swollen, bulging veins (varicose veins). Your breasts may begin to grow larger and become tender. Your nipples may stick out more, and the tissue that surrounds them  (areola) may become darker. Your gums may bleed and may be sensitive to brushing and flossing. Dark spots or blotches (chloasma, mask of pregnancy) may develop on your face. This will likely fade after the baby is born. Your menstrual periods will stop. You may have a loss of appetite. You may develop cravings for certain kinds of food. You may have changes in your emotions from day to day, such as being excited to be pregnant or being concerned that something may go wrong with the pregnancy and baby. You may have more vivid and strange dreams. You may have changes in your hair. These can include thickening of your hair, rapid growth, and changes in texture. Some women also have hair loss during or after pregnancy, or hair that feels dry or thin. Your hair will most likely return to normal after your baby is born. WHAT TO EXPECT AT YOUR PRENATAL   VISITS During a routine prenatal visit: You will be weighed to make sure you and the baby are growing normally. Your blood pressure will be taken. Your abdomen will be measured to track your baby's growth. The fetal heartbeat will be listened to starting around week 10 or 12 of your pregnancy. Test results from any previous visits will be discussed. Your health care provider may ask you: How you are feeling. If you are feeling the baby move. If you have had any abnormal symptoms, such as leaking fluid, bleeding, severe headaches, or abdominal cramping. If you have any questions. Other tests that may be performed during your first trimester include: Blood tests to find your blood type and to check for the presence of any previous infections. They will also be used to check for low iron levels (anemia) and Rh antibodies. Later in the pregnancy, blood tests for diabetes will be done along with other tests if problems develop. Urine tests to check for infections, diabetes, or protein in the urine. An ultrasound to confirm the proper growth and development  of the baby. An amniocentesis to check for possible genetic problems. Fetal screens for spina bifida and Down syndrome. You may need other tests to make sure you and the baby are doing well. HOME CARE INSTRUCTIONS  Medicines Follow your health care provider's instructions regarding medicine use. Specific medicines may be either safe or unsafe to take during pregnancy. Take your prenatal vitamins as directed. If you develop constipation, try taking a stool softener if your health care provider approves. Diet Eat regular, well-balanced meals. Choose a variety of foods, such as meat or vegetable-based protein, fish, milk and low-fat dairy products, vegetables, fruits, and whole grain breads and cereals. Your health care provider will help you determine the amount of weight gain that is right for you. Avoid raw meat and uncooked cheese. These carry germs that can cause birth defects in the baby. Eating four or five small meals rather than three large meals a day may help relieve nausea and vomiting. If you start to feel nauseous, eating a few soda crackers can be helpful. Drinking liquids between meals instead of during meals also seems to help nausea and vomiting. If you develop constipation, eat more high-fiber foods, such as fresh vegetables or fruit and whole grains. Drink enough fluids to keep your urine clear or pale yellow. Activity and Exercise Exercise only as directed by your health care provider. Exercising will help you: Control your weight. Stay in shape. Be prepared for labor and delivery. Experiencing pain or cramping in the lower abdomen or low back is a good sign that you should stop exercising. Check with your health care provider before continuing normal exercises. Try to avoid standing for long periods of time. Move your legs often if you must stand in one place for a long time. Avoid heavy lifting. Wear low-heeled shoes, and practice good posture. You may continue to have sex  unless your health care provider directs you otherwise. Relief of Pain or Discomfort Wear a good support bra for breast tenderness.   Take warm sitz baths to soothe any pain or discomfort caused by hemorrhoids. Use hemorrhoid cream if your health care provider approves.   Rest with your legs elevated if you have leg cramps or low back pain. If you develop varicose veins in your legs, wear support hose. Elevate your feet for 15 minutes, 3-4 times a day. Limit salt in your diet. Prenatal Care Schedule your prenatal visits by the   twelfth week of pregnancy. They are usually scheduled monthly at first, then more often in the last 2 months before delivery. Write down your questions. Take them to your prenatal visits. Keep all your prenatal visits as directed by your health care provider. Safety Wear your seat belt at all times when driving. Make a list of emergency phone numbers, including numbers for family, friends, the hospital, and police and fire departments. General Tips Ask your health care provider for a referral to a local prenatal education class. Begin classes no later than at the beginning of month 6 of your pregnancy. Ask for help if you have counseling or nutritional needs during pregnancy. Your health care provider can offer advice or refer you to specialists for help with various needs. Do not use hot tubs, steam rooms, or saunas. Do not douche or use tampons or scented sanitary pads. Do not cross your legs for long periods of time. Avoid cat litter boxes and soil used by cats. These carry germs that can cause birth defects in the baby and possibly loss of the fetus by miscarriage or stillbirth. Avoid all smoking, herbs, alcohol, and medicines not prescribed by your health care provider. Chemicals in these affect the formation and growth of the baby. Schedule a dentist appointment. At home, brush your teeth with a soft toothbrush and be gentle when you floss. SEEK MEDICAL CARE IF:   You have dizziness. You have mild pelvic cramps, pelvic pressure, or nagging pain in the abdominal area. You have persistent nausea, vomiting, or diarrhea. You have a bad smelling vaginal discharge. You have pain with urination. You notice increased swelling in your face, hands, legs, or ankles. SEEK IMMEDIATE MEDICAL CARE IF:  You have a fever. You are leaking fluid from your vagina. You have spotting or bleeding from your vagina. You have severe abdominal cramping or pain. You have rapid weight gain or loss. You vomit blood or material that looks like coffee grounds. You are exposed to German measles and have never had them. You are exposed to fifth disease or chickenpox. You develop a severe headache. You have shortness of breath. You have any kind of trauma, such as from a fall or a car accident. Document Released: 04/27/2001 Document Revised: 09/17/2013 Document Reviewed: 03/13/2013 ExitCare Patient Information 2015 [employer]. This information is not intended to replace advice given to you by your health care provider. Make sure you discuss any questions you have with your health care provider.  

## 2021-01-05 NOTE — Progress Notes (Signed)
[place] 12+4 wks,measurements c/w dates,CRL 56.34 mm,NB present,NT 1.2 mm,normal ovaries,FHR 164 bpm

## 2021-01-06 ENCOUNTER — Other Ambulatory Visit: Payer: Self-pay | Admitting: Women's Health

## 2021-01-06 MED ORDER — FAMOTIDINE 20 MG PO TABS: 20.0000 mg | ORAL_TABLET | Freq: Two times a day (BID) | ORAL | 3 refills | Status: DC

## 2021-01-08 LAB — GC/CHLAMYDIA PROBE AMP
Chlamydia trachomatis, NAA: NEGATIVE
Neisseria Gonorrhoeae by PCR: NEGATIVE

## 2021-01-08 LAB — PMP SCREEN PROFILE (10S), URINE
Amphetamine Scrn, Ur: NEGATIVE ng/mL
BARBITURATE SCREEN URINE: NEGATIVE ng/mL
BENZODIAZEPINE SCREEN, URINE: POSITIVE ng/mL — AB
CANNABINOIDS UR QL SCN: NEGATIVE ng/mL
Cocaine (Metab) Scrn, Ur: NEGATIVE ng/mL
Creatinine(Crt), U: 142.6 mg/dL (ref 20.0–300.0)
Methadone Screen, Urine: NEGATIVE ng/mL
OXYCODONE+OXYMORPHONE UR QL SCN: NEGATIVE ng/mL
Opiate Scrn, Ur: NEGATIVE ng/mL
Ph of Urine: 5.9 (ref 4.5–8.9)
Phencyclidine Qn, Ur: NEGATIVE ng/mL
Propoxyphene Scrn, Ur: NEGATIVE ng/mL

## 2021-01-08 LAB — URINE CULTURE

## 2021-01-08 LAB — STATUS REPORT

## 2021-01-12 DIAGNOSIS — R8271 Bacteriuria: Secondary | ICD-10-CM | POA: Insufficient documentation

## 2021-01-12 DIAGNOSIS — O99891 Other specified diseases and conditions complicating pregnancy: Secondary | ICD-10-CM | POA: Insufficient documentation

## 2021-01-12 MED ORDER — NITROFURANTOIN MONOHYD MACRO 100 MG PO CAPS: 100.0000 mg | ORAL_CAPSULE | Freq: Two times a day (BID) | ORAL | 0 refills | Status: DC

## 2021-01-12 NOTE — Addendum Note (Signed)
Addended by: Wells Guiles R on: 01/12/2021 09:00 AM   Modules accepted: Orders

## 2021-01-13 ENCOUNTER — Encounter: Payer: Self-pay | Admitting: Women's Health

## 2021-01-13 LAB — CBC/D/PLT+RPR+RH+ABO+RUBIGG...
Antibody Screen: NEGATIVE
Basophils Absolute: 0 10*3/uL (ref 0.0–0.2)
Basos: 0 %
EOS (ABSOLUTE): 0.1 10*3/uL (ref 0.0–0.4)
Eos: 1 %
HCV Ab: <0.1 s/co ratio (ref 0.0–0.9)
HIV Screen 4th Generation wRfx: NONREACTIVE
Hematocrit: 36.9 % (ref 34.0–46.6)
Hemoglobin: 12.8 g/dL (ref 11.1–15.9)
Hepatitis B Surface Ag: NEGATIVE
Immature Grans (Abs): 0 10*3/uL (ref 0.0–0.1)
Immature Granulocytes: 0 %
Lymphocytes Absolute: 1.4 10*3/uL (ref 0.7–3.1)
Lymphs: 15 %
MCH: 30 pg (ref 26.6–33.0)
MCHC: 34.7 g/dL (ref 31.5–35.7)
MCV: 87 fL (ref 79–97)
Monocytes Absolute: 0.2 10*3/uL (ref 0.1–0.9)
Monocytes: 3 %
Neutrophils Absolute: 7.6 10*3/uL — ABNORMAL HIGH (ref 1.4–7.0)
Neutrophils: 81 %
Platelets: 238 10*3/uL (ref 150–450)
RBC: 4.26 x10E6/uL (ref 3.77–5.28)
RDW: 13.1 % (ref 11.7–15.4)
RPR Ser Ql: NONREACTIVE
Rh Factor: NEGATIVE
Rubella Antibodies, IGG: 1.85 index (ref >0.99)
WBC: 9.2 10*3/uL (ref 3.4–10.8)

## 2021-01-13 LAB — INTEGRATED 1
Crown Rump Length: 56.3 mm
Gest. Age on Collection Date: 12 weeks
Maternal Age at EDD: 28.3 yr
Nuchal Translucency (NT): 1.2 mm
Number of Fetuses: 1
PAPP-A Value: 698.8 ng/mL
Weight: 160 [lb_av]

## 2021-01-13 LAB — HEMOGLOBIN A1C
Est. average glucose Bld gHb Est-mCnc: 97 mg/dL
Hgb A1c MFr Bld: 5 % (ref 4.8–5.6)

## 2021-01-13 LAB — HCV INTERPRETATION

## 2021-01-17 ENCOUNTER — Other Ambulatory Visit: Payer: Self-pay | Admitting: Women's Health

## 2021-01-17 DIAGNOSIS — Z87898 Personal history of other specified conditions: Secondary | ICD-10-CM

## 2021-01-17 DIAGNOSIS — F1991 Other psychoactive substance use, unspecified, in remission: Secondary | ICD-10-CM

## 2021-01-17 DIAGNOSIS — Z79899 Other long term (current) drug therapy: Secondary | ICD-10-CM

## 2021-01-20 ENCOUNTER — Other Ambulatory Visit: Payer: Self-pay | Admitting: Obstetrics & Gynecology

## 2021-01-20 DIAGNOSIS — Z87898 Personal history of other specified conditions: Secondary | ICD-10-CM

## 2021-01-20 DIAGNOSIS — Z79899 Other long term (current) drug therapy: Secondary | ICD-10-CM

## 2021-01-20 DIAGNOSIS — F1991 Other psychoactive substance use, unspecified, in remission: Secondary | ICD-10-CM

## 2021-01-20 MED ORDER — CLONAZEPAM 1 MG PO TABS: ORAL_TABLET | ORAL | 2 refills | Status: DC

## 2021-01-26 ENCOUNTER — Telehealth: Payer: Self-pay

## 2021-01-26 NOTE — Telephone Encounter (Signed)
Pt returned earlier call. Stated that she'd been cramping for approx 4 days and having light spotting, noticeable when wiping and at times heavy enough to wear a pantiliner. Pt went to the hospital in [place] x 2. First time, they couldn't find a heartbeat and told pt that she must have an anterior placenta and that's the reason they couldn't find it. Second time, the pt left d/t long waiting time.  Spoke with Knute Neu, who stated that she needed to be seen. If no available appts, she would need to go to [facility]. Called pt to explain. Pt stated that she had no transportation and would not be able to be seen. Stressed the importance of being evaluated. Pt will call back if she's able to get a ride.

## 2021-01-26 NOTE — Telephone Encounter (Signed)
Called pt for clarification on mychart msg. No answer, left vm to call back.

## 2021-02-01 ENCOUNTER — Encounter (HOSPITAL_COMMUNITY): Payer: Self-pay

## 2021-02-01 ENCOUNTER — Emergency Department (HOSPITAL_COMMUNITY)
Admission: EM | Admit: 2021-02-01 | Discharge: 2021-02-01 | Disposition: A | Discharge status: Home / Self Care | Payer: Medicaid Other | Attending: Emergency Medicine | Admitting: Emergency Medicine

## 2021-02-01 ENCOUNTER — Other Ambulatory Visit: Payer: Self-pay

## 2021-02-01 DIAGNOSIS — R102 Pelvic and perineal pain: Secondary | ICD-10-CM | POA: Insufficient documentation

## 2021-02-01 DIAGNOSIS — M545 Low back pain, unspecified: Secondary | ICD-10-CM | POA: Insufficient documentation

## 2021-02-01 DIAGNOSIS — Z87891 Personal history of nicotine dependence: Secondary | ICD-10-CM | POA: Insufficient documentation

## 2021-02-01 DIAGNOSIS — O26892 Other specified pregnancy related conditions, second trimester: Secondary | ICD-10-CM | POA: Insufficient documentation

## 2021-02-01 DIAGNOSIS — Z3A16 16 weeks gestation of pregnancy: Secondary | ICD-10-CM | POA: Insufficient documentation

## 2021-02-01 LAB — URINALYSIS, ROUTINE W REFLEX MICROSCOPIC
Bilirubin Urine: NEGATIVE
Glucose, UA: NEGATIVE mg/dL
Hgb urine dipstick: NEGATIVE
Ketones, ur: NEGATIVE mg/dL
Leukocytes,Ua: NEGATIVE
Nitrite: NEGATIVE
Protein, ur: NEGATIVE mg/dL
Specific Gravity, Urine: 1.024 (ref 1.005–1.030)
pH: 7 (ref 5.0–8.0)

## 2021-02-01 NOTE — ED Triage Notes (Addendum)
Pt to er, pt states that [place] she [place], states that after she has been having some back pain and some tightness in her stomach.  States that she rode the zero gravity and a spinning ride.  Pt denies bleeding, states that she has some drainage.

## 2021-02-01 NOTE — ED Notes (Signed)
Discharge instructions including OB follow up and importance of avoid future fair rides discussed with pt. Pt verbalized understanding with no questions at this time

## 2021-02-01 NOTE — ED Provider Notes (Signed)
[facility] EMERGENCY DEPARTMENT Provider Note   CSN: [id number] Arrival date & time: 02/01/21  1600     History Chief Complaint  Patient presents with   Abdominal Pain    Patricia Richards is a 30 y.o. female who is currently [redacted] weeks pregnant, confirmed intrauterine pregnancy per [facility], presenting with low suprapubic pressure sensation with radiation into her lower back since she woke this morning.  She [place] the [place] where she [place] a number of rides including a zero gravity spinning type ride after which she had no sx until she woke today. She has had no vaginal bleeding, also denies n/v, dizzinesss, dysuria, hematuria.  She called her obgyn who advised check up here.    The history is provided by the patient.      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,gets off street   Panic attacks    Tachycardia    Urinary tract infection    gets them frequently   Vaginal Pap smear, abnormal     Patient Active Problem List   Diagnosis Date Noted   Asymptomatic bacteriuria during pregnancy in first trimester 01/12/2021   Encounter for supervision of normal pregnancy, antepartum 2021/01/25   Rh negative state in antepartum period 01-25-21   Death of husband 2019-10-22   History of gestational diabetes 10/24/2018   Tachycardia [id number]   History of illicit drug use [id number]   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   Abnormal Pap smear of cervix 05/20/2015   Depression with anxiety 05/13/2015   Hallucinations 05/13/2015   History of suicidal ideation 05/13/2015   History of alcohol abuse 03/18/2015    Past Surgical History:  Procedure Laterality Date   DILATION AND EVACUATION  11/28/2010     OB History     Gravida   9   Para  6   Term  6   Preterm  0   AB  2   Living  6      SAB  1   IAB  1   Ectopic  0   Multiple  0   Live Births  6           Family History  Problem Relation Age of Onset   Cerebral palsy Mother    Hyperlipidemia Father    Hypertension Father    Cancer Father        skin   Mental illness Father    Diabetes Father    Other Brother        problems with gums   Cancer Maternal Grandmother    Mental illness Maternal Grandmother    Stroke Maternal Grandmother    Heart attack Paternal Grandmother    Down syndrome Cousin    Bipolar disorder Cousin     Social History   Tobacco Use   Smoking status: Former    Types: Cigarettes    Quit date: 02/14/2018    Years since quitting: 2.9   Smokeless tobacco: Never  Vaping Use   Vaping Use: Some days  Substance Use Topics   Alcohol use: Not Currently    Comment: 'alcohol problem prior to preg,  nothing since LMP   Drug use: Not Currently    Types: Other-see comments, Marijuana, Cocaine, MDMA (Ecstacy)    Comment: none since April 2018    Home Medications Prior to Admission medications   Medication Sig Start Date End Date Taking? Authorizing Provider  clonazePAM (KLONOPIN) 1 MG tablet 1 three times daily as needed Patient taking differently: Take 1 mg by mouth in the morning, at noon, and at bedtime. 01/20/21  Yes Florian Buff, MD  famotidine (PEPCID) 20 MG tablet Take 1 tablet (20 mg total) by mouth 2 (two) times daily. 01/06/21  Yes Roma Schanz, CNM  promethazine (PHENERGAN) 25 MG tablet Take 0.5-1 tablets (12.5-25 mg total) by mouth every 6 (six) hours as needed for nausea or vomiting. 01/05/21  Yes Roma Schanz, CNM  Blood Pressure Monitor MISC For regular home bp monitoring during pregnancy 01/05/21   Roma Schanz, CNM  nitrofurantoin, macrocrystal-monohydrate, (MACROBID) 100 MG capsule Take 1 capsule (100 mg total) by mouth 2 (two) times daily. X 7 days Patient not taking: Reported on  02/01/2021 01/12/21   Roma Schanz, CNM    Allergies    Metronidazole, Tramadol, Reglan [metoclopramide hcl], and Toradol [ketorolac tromethamine]  Review of Systems   Review of Systems  Constitutional:  Negative for fever.  HENT:  Negative for congestion and sore throat.   Eyes: Negative.   Respiratory:  Negative for chest tightness and shortness of breath.   Cardiovascular:  Negative for chest pain.  Gastrointestinal:  Negative for abdominal pain and nausea.  Genitourinary: Negative.   Musculoskeletal:  Negative for arthralgias, joint swelling and neck pain.  Skin: Negative.  Negative for rash and wound.  Neurological:  Negative for dizziness, weakness, light-headedness, numbness and headaches.  Psychiatric/Behavioral: Negative.     Physical Exam Updated Vital Signs BP 114/64   Pulse 81   Temp 98 F (36.7 C)   Resp 16   Ht '5\' 2"'$  (1.575 m)   Wt 72.6 kg   LMP 10/09/2020 (Exact Date)   SpO2 100%   BMI 29.26 kg/m   Physical Exam Vitals and nursing note reviewed.  Constitutional:      Appearance: She is well-developed.  HENT:     Head: Normocephalic and atraumatic.  Eyes:     Conjunctiva/sclera: Conjunctivae normal.  Cardiovascular:     Rate and Rhythm: Normal rate and regular rhythm.     Heart sounds: Normal heart sounds.  Pulmonary:     Effort: Pulmonary effort is normal.     Breath sounds: Normal breath sounds. No wheezing.  Abdominal:     General: Bowel sounds are normal.     Palpations: Abdomen is soft.     Tenderness: There is no abdominal tenderness. There is no guarding or rebound.     Comments: gravid  Musculoskeletal:        General: Normal range of motion.     Cervical back: Normal range of motion.  Skin:    General: Skin is warm and dry.  Neurological:     Mental Status: She is alert.    ED Results / Procedures / Treatments   Labs (all labs ordered are listed, but only abnormal results are displayed) Labs Reviewed  URINALYSIS, ROUTINE W  REFLEX MICROSCOPIC - Abnormal; Notable for the following components:      Result Value   APPearance HAZY (*)    All other components within normal limits    EKG None  Radiology No results found.  Procedures  Procedures   Medications Ordered in ED Medications - No data to display  ED Course  I have reviewed the triage vital signs and the nursing notes.  Pertinent labs & imaging results that were available during my care of the patient were reviewed by me and considered in my medical decision making (see chart for details).    MDM Rules/Calculators/A&P                           Patient presenting with soreness of her lower pelvic region along with low back pain after riding several rides at a local fair involving intense spinning rides.  She has had no vaginal bleeding or discharge.  Abdominal exam is benign.  Her exam and labs are reassuring, no UTI, her fetal heart tones are normal at 158 bpm.  Vital signs were stable throughout this visit.  Doubt her symptoms are secondary to pregnancy complication.  [facility] is a patient of [facility], she was advised close follow-up with them if her symptoms persist.  Return precautions were outlined. Final Clinical Impression(s) / ED Diagnoses Final diagnoses:  Pelvic pain in female    Rx / DC Orders ED Discharge Orders     None        Landis Martins 02/01/21 [place], Alvin Critchley, DO 02/02/21 [id number]

## 2021-02-01 NOTE — Discharge Instructions (Addendum)
Your exam is reassuring tonight.  As long as you are not having any vaginal bleeding it is safe to follow-up with [facility] at your next scheduled visit.  However if you have any worsening pain symptoms or you develop any bleeding call immediately and/or return here for further evaluation of your symptoms.  Rest, avoid any strenuous activity.  May take Tylenol if needed for symptom relief.  A heating pad applied to your back for 15 minutes 2-3 times daily can also be soothing.

## 2021-02-04 ENCOUNTER — Encounter: Payer: Medicaid Other | Admitting: Advanced Practice Midwife

## 2021-02-05 ENCOUNTER — Encounter: Payer: Self-pay | Admitting: Women's Health

## 2021-02-11 ENCOUNTER — Encounter: Payer: Self-pay | Admitting: Women's Health

## 2021-02-11 ENCOUNTER — Other Ambulatory Visit: Payer: Self-pay

## 2021-02-11 ENCOUNTER — Ambulatory Visit (INDEPENDENT_AMBULATORY_CARE_PROVIDER_SITE_OTHER): Payer: Medicaid Other | Admitting: Women's Health

## 2021-02-11 VITALS — BP 120/71 | HR 89 | Wt 161.0 lb

## 2021-02-11 DIAGNOSIS — Z1379 Encounter for other screening for genetic and chromosomal anomalies: Secondary | ICD-10-CM

## 2021-02-11 DIAGNOSIS — Z3A17 17 weeks gestation of pregnancy: Secondary | ICD-10-CM

## 2021-02-11 DIAGNOSIS — Z3482 Encounter for supervision of other normal pregnancy, second trimester: Secondary | ICD-10-CM

## 2021-02-11 DIAGNOSIS — Z363 Encounter for antenatal screening for malformations: Secondary | ICD-10-CM

## 2021-02-11 DIAGNOSIS — Z3480 Encounter for supervision of other normal pregnancy, unspecified trimester: Secondary | ICD-10-CM
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
## 2021-02-11 NOTE — Patient Instructions (Signed)

## 2021-02-13 LAB — INTEGRATED 2
AFP MoM: 1.25
Alpha-Fetoprotein: 41.4 ng/mL
Crown Rump Length: 56.3 mm
DIA MoM: 0.92
DIA Value: 125 pg/mL
Estriol, Unconjugated: 1.36 ng/mL
Gest. Age on Collection Date: 12 weeks
Gestational Age: 17.3 weeks
Maternal Age at EDD: 28.3 yr
Nuchal Translucency (NT): 1.2 mm
Nuchal Translucency MoM: 0.96
Number of Fetuses: 1
PAPP-A MoM: 0.95
PAPP-A Value: 698.8 ng/mL
Test Results:: NEGATIVE
Weight: 160 [lb_av]
Weight: 189 [lb_av]
hCG MoM: 1.06
hCG Value: 27.6 IU/mL
uE3 MoM: 1.15

## 2021-02-15 LAB — URINE CULTURE

## 2021-02-16 ENCOUNTER — Other Ambulatory Visit: Payer: Self-pay

## 2021-02-16 ENCOUNTER — Inpatient Hospital Stay (HOSPITAL_COMMUNITY)
Admission: AD | Admit: 2021-02-16 | Discharge: 2021-02-16 | Disposition: A | Discharge status: Home / Self Care | Payer: Medicaid Other | Attending: Obstetrics and Gynecology | Admitting: Obstetrics and Gynecology

## 2021-02-16 DIAGNOSIS — R102 Pelvic and perineal pain: Secondary | ICD-10-CM | POA: Insufficient documentation

## 2021-02-16 DIAGNOSIS — Z3A18 18 weeks gestation of pregnancy: Secondary | ICD-10-CM

## 2021-02-16 DIAGNOSIS — O26892 Other specified pregnancy related conditions, second trimester: Secondary | ICD-10-CM | POA: Insufficient documentation

## 2021-02-16 DIAGNOSIS — Z87891 Personal history of nicotine dependence: Secondary | ICD-10-CM | POA: Diagnosis not present

## 2021-02-16 DIAGNOSIS — O0942 Supervision of pregnancy with grand multiparity, second trimester: Secondary | ICD-10-CM | POA: Insufficient documentation

## 2021-02-16 DIAGNOSIS — O26899 Other specified pregnancy related conditions, unspecified trimester: Secondary | ICD-10-CM

## 2021-02-16 LAB — URINALYSIS, ROUTINE W REFLEX MICROSCOPIC
Bilirubin Urine: NEGATIVE
Glucose, UA: NEGATIVE mg/dL
Hgb urine dipstick: NEGATIVE
Ketones, ur: NEGATIVE mg/dL
Leukocytes,Ua: NEGATIVE
Nitrite: NEGATIVE
Protein, ur: NEGATIVE mg/dL
Specific Gravity, Urine: 1.019 (ref 1.005–1.030)
pH: 7 (ref 5.0–8.0)

## 2021-02-16 NOTE — MAU Note (Signed)
Pt feeling a lot of pelvic pressure. Rates pain 5/10.

## 2021-02-16 NOTE — Discharge Instructions (Signed)
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
## 2021-03-02 ENCOUNTER — Encounter: Payer: Medicaid Other | Admitting: Women's Health

## 2021-03-02 ENCOUNTER — Other Ambulatory Visit: Payer: Medicaid Other

## 2021-03-19 ENCOUNTER — Other Ambulatory Visit: Payer: Medicaid Other

## 2021-03-19 ENCOUNTER — Other Ambulatory Visit: Payer: Self-pay

## 2021-03-19 ENCOUNTER — Encounter: Payer: Medicaid Other | Admitting: Advanced Practice Midwife

## 2021-03-19 ENCOUNTER — Ambulatory Visit (INDEPENDENT_AMBULATORY_CARE_PROVIDER_SITE_OTHER): Payer: Medicaid Other

## 2021-03-19 ENCOUNTER — Ambulatory Visit (INDEPENDENT_AMBULATORY_CARE_PROVIDER_SITE_OTHER): Payer: Medicaid Other | Admitting: Advanced Practice Midwife

## 2021-03-19 VITALS — BP 107/72 | HR 85 | Wt 167.0 lb

## 2021-03-19 DIAGNOSIS — Z3482 Encounter for supervision of other normal pregnancy, second trimester: Secondary | ICD-10-CM

## 2021-03-19 DIAGNOSIS — Z3A23 23 weeks gestation of pregnancy: Secondary | ICD-10-CM

## 2021-03-19 DIAGNOSIS — O26899 Other specified pregnancy related conditions, unspecified trimester: Secondary | ICD-10-CM

## 2021-03-19 DIAGNOSIS — Z3480 Encounter for supervision of other normal pregnancy, unspecified trimester: Secondary | ICD-10-CM

## 2021-03-19 DIAGNOSIS — Z363 Encounter for antenatal screening for malformations: Secondary | ICD-10-CM

## 2021-03-19 DIAGNOSIS — Z6791 Unspecified blood type, Rh negative: Secondary | ICD-10-CM

## 2021-03-19 NOTE — Progress Notes (Signed)
[place] 23 wks,cephalic,posterior placenta gr 0,normal ovaries,cx 4.4 cm,fhr 129 bpm,svp of fluid 6.6 cm,EFW 561 g 46%,anatomy complete,no obvious abnormalities

## 2021-03-19 NOTE — Patient Instructions (Signed)

## 2021-03-19 NOTE — Progress Notes (Signed)
   LOW-RISK PREGNANCY VISIT Patient name: Patricia Richards MRN [id number]  Date of birth: 1990-07-18 Chief Complaint:   Routine Prenatal Visit  History of Present Illness:   Patricia Richards is a 30 y.o. [id number] female at [redacted]w[redacted]d with an Estimated Date of Delivery: 07/16/21 being seen today for ongoing management of a low-risk pregnancy.  Today she reports wanting 1--% contractiption.  Discussed Salpingectomy and ablation around 12 weeks. Contractions: Not present. Vag. Bleeding: None.  Movement: Present. denies leaking of fluid. Review of Systems:   Pertinent items are noted in HPI Denies abnormal vaginal discharge w/ itching/odor/irritation, headaches, visual changes, shortness of breath, chest pain, abdominal pain, severe nausea/vomiting, or problems with urination or bowel movements unless otherwise stated above. Pertinent History Reviewed:  Reviewed past medical,surgical, social, obstetrical and family history.  Reviewed problem list, medications and allergies. Physical Assessment:   Vitals:   03/19/21 1445  BP: 107/72  Pulse: 85  Weight: 167 lb (75.8 kg)  Body mass index is 30.54 kg/m.        Physical Examination:   General appearance: Well appearing, and in no distress  Mental status: Alert, oriented to person, place, and time  Skin: Warm & dry  Cardiovascular: Normal heart rate noted  Respiratory: Normal respiratory effort, no distress  Abdomen: Soft, gravid, nontender  Pelvic: Cervical exam deferred         Extremities: Edema: None  Fetal Status:     Movement: Present  [place] 23 wks,cephalic,posterior placenta gr 0,normal ovaries,cx 4.4 cm,fhr 129 bpm,svp of fluid 6.6 cm,EFW 561 g 46%,anatomy complete,no obvious abnormalities   Chaperone: n/a    No results found for this or any previous visit (from the past 24 hour(s)).  Assessment & Plan:  1) Low-risk pregnancy [id number] at [redacted]w[redacted]d with an Estimated Date of Delivery: 07/16/21   2) , anxiety:  continue klonopin   Meds: No orders  of the defined types were placed in this encounter.  Labs/procedures today: none  Plan:  Continue routine obstetrical care  Next visit: prefers in person    Reviewed: Preterm labor symptoms and general obstetric precautions including but not limited to vaginal bleeding, contractions, leaking of fluid and fetal movement were reviewed in detail with the patient.  All questions were answered. Has home bp cuff.. Check bp weekly, let us know if >140/90.   Follow-up: Return in about 3 weeks (around 04/09/2021) for PN2/LROB.  No orders of the defined types were placed in this encounter.  Christin Fudge DNP, CNM 03/19/2021 3:40 PM

## 2021-04-06 ENCOUNTER — Other Ambulatory Visit: Payer: Self-pay | Admitting: Women's Health

## 2021-04-06 ENCOUNTER — Encounter: Payer: Self-pay | Admitting: Advanced Practice Midwife

## 2021-04-06 DIAGNOSIS — B009 Herpesviral infection, unspecified: Secondary | ICD-10-CM | POA: Insufficient documentation

## 2021-04-06 MED ORDER — ACYCLOVIR 400 MG PO TABS: 400.0000 mg | ORAL_TABLET | Freq: Three times a day (TID) | ORAL | 3 refills | Status: DC

## 2021-04-07 ENCOUNTER — Ambulatory Visit (INDEPENDENT_AMBULATORY_CARE_PROVIDER_SITE_OTHER): Payer: Medicaid Other | Admitting: Obstetrics & Gynecology

## 2021-04-07 ENCOUNTER — Other Ambulatory Visit: Payer: Self-pay

## 2021-04-07 ENCOUNTER — Other Ambulatory Visit: Payer: Medicaid Other

## 2021-04-07 ENCOUNTER — Encounter: Payer: Self-pay | Admitting: Obstetrics & Gynecology

## 2021-04-07 VITALS — BP 108/72 | HR 96 | Wt 169.0 lb

## 2021-04-07 DIAGNOSIS — Z3A25 25 weeks gestation of pregnancy: Secondary | ICD-10-CM | POA: Diagnosis not present

## 2021-04-07 DIAGNOSIS — Z3480 Encounter for supervision of other normal pregnancy, unspecified trimester: Secondary | ICD-10-CM

## 2021-04-07 DIAGNOSIS — Z23 Encounter for immunization: Secondary | ICD-10-CM

## 2021-04-07 NOTE — Progress Notes (Signed)
   LOW-RISK PREGNANCY VISIT Patient name: Patricia Richards MRN [id number]  Date of birth: 05/10/1991 Chief Complaint:   Routine Prenatal Visit (PN2/ Tdap today)  History of Present Illness:   Patricia Richards is a 30 y.o. [id number] female at [redacted]w[redacted]d with an Estimated Date of Delivery: 07/16/21 being seen today for ongoing management of a low-risk pregnancy.  Depression screen [facility] 2/9 04/07/2021 02/13/2019 07/26/2018 03/02/2018 01/27/2017  Decreased Interest 1 0 2 1 1   Down, Depressed, Hopeless 1 0 2 1 0  PHQ - 2 Score 2 0 4 2 1   Altered sleeping 1 - - 1 1  Tired, decreased energy 3 - - 2 2  Change in appetite 2 - - 0 0  Feeling bad or failure about yourself  2 - - 1 2  Trouble concentrating 2 - - 1 0  Moving slowly or fidgety/restless 3 - - 0 1  Suicidal thoughts 0 - - 0 0  PHQ-9 Score 15 - - 7 7  Some recent data might be hidden    Today she reports no complaints.  .  .   . denies leaking of fluid. Review of Systems:   Pertinent items are noted in HPI Denies abnormal vaginal discharge w/ itching/odor/irritation, headaches, visual changes, shortness of breath, chest pain, abdominal pain, severe nausea/vomiting, or problems with urination or bowel movements unless otherwise stated above. Pertinent History Reviewed:  Reviewed past medical,surgical, social, obstetrical and family history.  Reviewed problem list, medications and allergies. Physical Assessment:   Vitals:   04/07/21 0908  BP: 108/72  Pulse: 96  Weight: 169 lb (76.7 kg)  Body mass index is 30.91 kg/m.        Physical Examination:   General appearance: Well appearing, and in no distress  Mental status: Alert, oriented to person, place, and time  Skin: Warm & dry  Cardiovascular: Normal heart rate noted  Respiratory: Normal respiratory effort, no distress  Abdomen: Soft, gravid, nontender  Pelvic: Cervical exam deferred         Extremities:    Fetal Status: Fetal Heart Rate (bpm): 134 Fundal Height: 26 cm       Chaperone: n/a    No results found for this or any previous visit (from the past 24 hour(s)).  Assessment & Plan:  1) Low-risk pregnancy [id number] at [redacted]w[redacted]d with an Estimated Date of Delivery: 07/16/21   2) Klonipen dependence and use   Meds: No orders of the defined types were placed in this encounter.  Labs/procedures today: PN2  Plan:  Continue routine obstetrical care  Next visit: prefers in person    Reviewed: Preterm labor symptoms and general obstetric precautions including but not limited to vaginal bleeding, contractions, leaking of fluid and fetal movement were reviewed in detail with the patient.  All questions were answered. Has home bp cuff. Rx faxed to . Check bp weekly, let us know if >140/90.   Follow-up: Return in about 4 weeks (around 05/05/2021) for [place].  Orders Placed This Encounter  Procedures   Tdap vaccine greater than or equal to 7yo IM    Florian Buff, MD 04/07/2021 10:18 AM

## 2021-04-08 ENCOUNTER — Encounter: Payer: Self-pay | Admitting: Women's Health

## 2021-04-08 LAB — CBC
Hematocrit: 34.7 % (ref 34.0–46.6)
Hemoglobin: 11.3 g/dL (ref 11.1–15.9)
MCH: 29.1 pg (ref 26.6–33.0)
MCHC: 32.6 g/dL (ref 31.5–35.7)
MCV: 89 fL (ref 79–97)
Platelets: 223 10*3/uL (ref 150–450)
RBC: 3.88 x10E6/uL (ref 3.77–5.28)
RDW: 13.3 % (ref 11.7–15.4)
WBC: 7.6 10*3/uL (ref 3.4–10.8)

## 2021-04-08 LAB — GLUCOSE TOLERANCE, 2 HOURS W/ 1HR
Glucose, 1 hour: 170 mg/dL (ref 70–179)
Glucose, 2 hour: 136 mg/dL (ref 70–152)
Glucose, Fasting: 89 mg/dL (ref 70–91)

## 2021-04-08 LAB — ANTIBODY SCREEN: Antibody Screen: NEGATIVE

## 2021-04-08 LAB — HIV ANTIBODY (ROUTINE TESTING W REFLEX): HIV Screen 4th Generation wRfx: NONREACTIVE

## 2021-04-08 LAB — RPR: RPR Ser Ql: NONREACTIVE

## 2021-04-15 ENCOUNTER — Encounter: Payer: Self-pay | Admitting: Advanced Practice Midwife

## 2021-04-15 ENCOUNTER — Other Ambulatory Visit: Payer: Self-pay | Admitting: Obstetrics & Gynecology

## 2021-04-15 ENCOUNTER — Encounter: Payer: Self-pay | Admitting: Obstetrics & Gynecology

## 2021-04-15 DIAGNOSIS — F1991 Other psychoactive substance use, unspecified, in remission: Secondary | ICD-10-CM

## 2021-04-15 DIAGNOSIS — Z79899 Other long term (current) drug therapy: Secondary | ICD-10-CM

## 2021-04-16 MED ORDER — AMOXICILLIN 500 MG PO CAPS: 500.0000 mg | ORAL_CAPSULE | Freq: Three times a day (TID) | ORAL | 0 refills | Status: DC

## 2021-05-05 ENCOUNTER — Encounter: Payer: Self-pay | Admitting: Women's Health

## 2021-05-05 ENCOUNTER — Other Ambulatory Visit: Payer: Self-pay

## 2021-05-05 ENCOUNTER — Encounter: Payer: Self-pay | Admitting: Obstetrics & Gynecology

## 2021-05-05 ENCOUNTER — Ambulatory Visit (INDEPENDENT_AMBULATORY_CARE_PROVIDER_SITE_OTHER): Payer: Medicaid Other | Admitting: Women's Health

## 2021-05-05 VITALS — BP 117/69 | HR 102 | Wt 169.4 lb

## 2021-05-05 DIAGNOSIS — Z3A29 29 weeks gestation of pregnancy: Secondary | ICD-10-CM | POA: Diagnosis not present

## 2021-05-05 DIAGNOSIS — F1991 Other psychoactive substance use, unspecified, in remission: Secondary | ICD-10-CM

## 2021-05-05 DIAGNOSIS — Z3480 Encounter for supervision of other normal pregnancy, unspecified trimester: Secondary | ICD-10-CM | POA: Diagnosis not present

## 2021-05-05 DIAGNOSIS — Z79899 Other long term (current) drug therapy: Secondary | ICD-10-CM

## 2021-05-05 DIAGNOSIS — Z3483 Encounter for supervision of other normal pregnancy, third trimester: Secondary | ICD-10-CM

## 2021-05-05 MED ORDER — MECLIZINE HCL 50 MG PO TABS: 50.0000 mg | ORAL_TABLET | Freq: Three times a day (TID) | ORAL | 0 refills | Status: DC | PRN

## 2021-05-05 MED ORDER — CLONAZEPAM 1 MG PO TABS: ORAL_TABLET | ORAL | 2 refills | Status: DC

## 2021-05-05 NOTE — Progress Notes (Signed)
LOW-RISK PREGNANCY VISIT Patient name: Patricia Richards MRN [id number]  Date of birth: 03/16/1991 Chief Complaint:   Routine Prenatal Visit  History of Present Illness:   Patricia Richards is a 30 y.o. [id number] female at [redacted]w[redacted]d with an Estimated Date of Delivery: 07/16/21 being seen today for ongoing management of a low-risk pregnancy.   Today she reports  not feeling baby move much entire pregnancy, no change recently. States last visit she told Dr. Elonda Husky the same and he felt baby move while doing doppler, and pt still didn't feel . Wants [place] to know she took a lortab 10mg  (rx'd by Deretha Emory at [facility]) about 2-3wks for tooth pain, in case it shows up in cord blood at delivery. Contractions: Not present. Vag. Bleeding: None.  Movement: (!) Decreased. denies leaking of fluid.  Depression screen [facility] 2/9 04/07/2021 02/13/2019 07/26/2018 03/02/2018 01/27/2017  Decreased Interest 1 0 2 1 1   Down, Depressed, Hopeless 1 0 2 1 0  PHQ - 2 Score 2 0 4 2 1   Altered sleeping 1 - - 1 1  Tired, decreased energy 3 - - 2 2  Change in appetite 2 - - 0 0  Feeling bad or failure about yourself  2 - - 1 2  Trouble concentrating 2 - - 1 0  Moving slowly or fidgety/restless 3 - - 0 1  Suicidal thoughts 0 - - 0 0  PHQ-9 Score 15 - - 7 7  Some recent data might be hidden     GAD 7 : Generalized Anxiety Score 04/07/2021  Nervous, Anxious, on Edge 3  Control/stop worrying 3  Worry too much - different things 3  Trouble relaxing 2  Restless 2  Easily annoyed or irritable 3  Afraid - awful might happen 3  Total GAD 7 Score 19      Review of Systems:   Pertinent items are noted in HPI Denies abnormal vaginal discharge w/ itching/odor/irritation, headaches, visual changes, shortness of breath, chest pain, abdominal pain, severe nausea/vomiting, or problems with urination or bowel movements unless otherwise stated above. Pertinent History Reviewed:  Reviewed past medical,surgical, social, obstetrical  and family history.  Reviewed problem list, medications and allergies. Physical Assessment:   Vitals:   05/05/21 1607  BP: 117/69  Pulse: (!) 102  Weight: 169 lb 6.4 oz (76.8 kg)  Body mass index is 30.98 kg/m.        Physical Examination:   General appearance: Well appearing, and in no distress  Mental status: Alert, oriented to person, place, and time  Skin: Warm & dry  Cardiovascular: Normal heart rate noted  Respiratory: Normal respiratory effort, no distress  Abdomen: Soft, gravid, nontender  Pelvic: Cervical exam deferred         Extremities: Edema: None  Fetal Status: Fetal Heart Rate (bpm): 144 Fundal Height: 30 cm Movement: (!) Decreased   multiple fetal movements seen by myself, pt felt only 1 of them  Chaperone: N/A   No results found for this or any previous visit (from the past 24 hour(s)).  Assessment & Plan:  1) Low-risk pregnancy [id number] at [redacted]w[redacted]d with an Estimated Date of Delivery: 07/16/21   2) Perceived fetal movement, entire pregnancy, no change recently. Multiple visible fetal movements during FH and FHR, pt only felt 1. Placenta is posterior, not sure if this is r/t grand multiparity, but baby is definitely moving well  3) H/o GDM> normal GTT this pregnancy  4) Dep/anx> on klonopin  Meds: No orders of the defined types were placed in this encounter.  Labs/procedures today: Rhogam and declined flu shot  Plan:  Continue routine obstetrical care  Next visit: prefers in person    Reviewed: Preterm labor symptoms and general obstetric precautions including but not limited to vaginal bleeding, contractions, leaking of fluid and fetal movement were reviewed in detail with the patient.  All questions were answered. Does have home bp cuff. Office bp cuff given: not applicable. Check bp weekly, let us know if consistently >140 and/or >90.  Follow-up: Return in about 16 days (around 05/21/2021) for [place], CNM, in person.  Future Appointments  Date Time Provider  [place]  05/21/2021  1:50 PM Florian Buff, MD [facility]    Orders Placed This Encounter  Procedures   RHO (D) Immune Globulin   [place], [facility] 05/05/2021 4:56 PM

## 2021-05-05 NOTE — Patient Instructions (Signed)
Nakeitha, thank you for choosing our office today! We appreciate the opportunity to meet your healthcare needs. You may receive a short survey by mail, e-mail, or through [employer]. If you are happy with your care we would appreciate if you could take just a few minutes to complete the survey questions. We read all of your comments and take your feedback very seriously. Thank you again for choosing our office.  Center for [employer] Team at [place] at [facility] ([place], [place] [address]) Entrance C, located off of [address] parking   CLASSES: Go to [email or web address] to register for classes (childbirth, breastfeeding, waterbirth, infant CPR, daddy bootcamp, etc.)  Call the office [phone]) or go to [facility] if: You begin to have strong, frequent contractions Your water breaks.  Sometimes it is a big gush of fluid, sometimes it is just a trickle that keeps getting your panties wet or running down your legs You have vaginal bleeding.  It is normal to have a small amount of spotting if your cervix was checked.  You don't feel your baby moving like normal.  If you don't, get you something to eat and drink and lay down and focus on feeling your baby move.   If your baby is still not moving like normal, you should call the office or go to [facility].  Call the office [phone]) or go to [facility] hospital for these signs of pre-eclampsia: Severe headache that does not go away with Tylenol Visual changes- seeing spots, double, blurred vision Pain under your right breast or upper abdomen that does not go away with Tums or heartburn medicine Nausea and/or vomiting Severe swelling in your hands, feet, and face   Tdap Vaccine It is recommended that you get the Tdap vaccine during the third trimester of EACH pregnancy to help protect your baby from getting pertussis (whooping cough) 27-36 weeks is the BEST time to do  this so that you can pass the protection on to your baby. During pregnancy is better than after pregnancy, but if you are unable to get it during pregnancy it will be offered at the hospital.  You can get this vaccine with [place], at the health department, your family doctor, or some local pharmacies Everyone who will be around your baby should also be up-to-date on their vaccines before the baby comes. Adults (who are not pregnant) only need 1 dose of Tdap during adulthood.   [facility] Pediatricians/Family Doctors [facility]): [address] [place], [facility]: [address] [place], [phone]                [facility]): Register, [phone] (call to ask if accepting patients) [facility] Department: [address], [place], [place] Pediatricians/Family Doctors [facility]): [place]. [place], [address], [place] [facility]: [address] [place], [place] [facility] [place]: [place], [place] [facility]): [phone] [facility]: [address] [place]: [address], [place] [facility]: [phone], [phone]  Home Blood Pressure Monitoring for Patients   Your provider has recommended that you check your  blood pressure (BP) at least once a week at home. If you do not have a blood pressure cuff at home, one will be provided for you. Contact your provider if you have not received your monitor within 1 week.   Helpful Tips for Accurate Home Blood Pressure Checks  Don't smoke, exercise, or drink caffeine 30 minutes before checking your BP Use the restroom before checking your BP (a full bladder can raise your  pressure) Relax in a comfortable upright chair Feet on the ground Left arm resting comfortably on a flat surface at the level of your heart Legs uncrossed Back supported Sit quietly and don't talk Place the cuff on your bare arm Adjust snuggly, so that only two fingertips can fit between your skin and the top of the cuff Check 2 readings separated by at least one minute Keep a log of your BP readings For a visual, please reference this diagram: http://ccnc.care/bpdiagram  Provider Name: [facility]     Phone: [phone]  Zone 1: ALL CLEAR  Continue to monitor your symptoms:  BP reading is less than 140 (top number) or less than 90 (bottom number)  No right upper stomach pain No headaches or seeing spots No feeling nauseated or throwing up No swelling in face and hands  Zone 2: CAUTION Call your doctor's office for any of the following:  BP reading is greater than 140 (top number) or greater than 90 (bottom number)  Stomach pain under your ribs in the middle or right side Headaches or seeing spots Feeling nauseated or throwing up Swelling in face and hands  Zone 3: EMERGENCY  Seek immediate medical care if you have any of the following:  BP reading is greater than160 (top number) or greater than 110 (bottom number) Severe headaches not improving with Tylenol Serious difficulty catching your breath Any worsening symptoms from Zone 2  Preterm Labor and Birth Information  The normal length of a pregnancy is 39-41 weeks. Preterm labor is when labor starts before 37 completed weeks of pregnancy. What are the risk factors for preterm labor? Preterm labor is more likely to occur in women who: Have certain infections during pregnancy such as a bladder infection, sexually transmitted infection, or infection inside the uterus (chorioamnionitis). Have a shorter-than-normal cervix. Have gone into preterm labor before. Have had surgery on their cervix. Are younger than age 33  or older than age 22. Are African American. Are pregnant with twins or multiple babies (multiple gestation). Take street drugs or smoke while pregnant. Do not gain enough weight while pregnant. Became pregnant shortly after having been pregnant. What are the symptoms of preterm labor? Symptoms of preterm labor include: Cramps similar to those that can happen during a menstrual period. The cramps may happen with diarrhea. Pain in the abdomen or lower back. Regular uterine contractions that may feel like tightening of the abdomen. A feeling of increased pressure in the pelvis. Increased watery or bloody mucus discharge from the vagina. Water breaking (ruptured amniotic sac). Why is it important to recognize signs of preterm labor? It is important to recognize signs of preterm labor because babies who are born prematurely may not be fully developed. This can put them at an increased risk for: Long-term (chronic) heart and lung problems. Difficulty immediately after birth with regulating body systems, including blood sugar, body temperature, heart rate, and breathing rate. Bleeding in the brain. Cerebral palsy. Learning difficulties. Death. These risks are highest for babies who are born before 44 weeks  of pregnancy. How is preterm labor treated? Treatment depends on the length of your pregnancy, your condition, and the health of your baby. It may involve: Having a stitch (suture) placed in your cervix to prevent your cervix from opening too early (cerclage). Taking or being given medicines, such as: Hormone medicines. These may be given early in pregnancy to help support the pregnancy. Medicine to stop contractions. Medicines to help mature the babys lungs. These may be prescribed if the risk of delivery is high. Medicines to prevent your baby from developing cerebral palsy. If the labor happens before 34 weeks of pregnancy, you may need to stay in the hospital. What should I do if I  think I am in preterm labor? If you think that you are going into preterm labor, call your health care provider right away. How can I prevent preterm labor in future pregnancies? To increase your chance of having a full-term pregnancy: Do not use any tobacco products, such as cigarettes, chewing tobacco, and e-cigarettes. If you need help quitting, ask your health care provider. Do not use street drugs or medicines that have not been prescribed to you during your pregnancy. Talk with your health care provider before taking any herbal supplements, even if you have been taking them regularly. Make sure you gain a healthy amount of weight during your pregnancy. Watch for infection. If you think that you might have an infection, get it checked right away. Make sure to tell your health care provider if you have gone into preterm labor before. This information is not intended to replace advice given to you by your health care provider. Make sure you discuss any questions you have with your health care provider. Document Revised: 08/25/2018 Document Reviewed: 09/24/2015 Elsevier Patient Education  [place].

## 2021-05-14 ENCOUNTER — Encounter: Payer: Self-pay | Admitting: Women's Health

## 2021-05-15 ENCOUNTER — Encounter: Payer: Self-pay | Admitting: *Deleted

## 2021-05-17 NOTE — L&D Delivery Note (Signed)
Delivery Note Pt progressed to C/C/+1 w/urge to push before anesthesia could get there to place epidural.  After a 10 minute 2nd stage, at 11:03 PM a viable female was delivered via Vaginal, Spontaneous (Presentation: Left Occiput Posterior).  APGAR: 9, 9; weight pending.  After 1 minute, the cord was clamped and cut. 40 units of pitocin diluted in 1000cc LR was infused rapidly IV.  The placenta separated spontaneously and delivered via CCT and maternal pushing effort.  It was inspected and appears to be intact with a 3 VC.  Anesthesia: None Episiotomy: None Lacerations: None Suture Repair:  Est. Blood Loss (mL): 66  Mom to postpartum.  Baby to Couplet care / Skin to Skin.  Patricia Richards 07/10/2021, 12:09 AM

## 2021-05-21 ENCOUNTER — Other Ambulatory Visit: Payer: Self-pay

## 2021-05-21 ENCOUNTER — Encounter: Payer: Self-pay | Admitting: Obstetrics & Gynecology

## 2021-05-21 ENCOUNTER — Ambulatory Visit (INDEPENDENT_AMBULATORY_CARE_PROVIDER_SITE_OTHER): Payer: Medicaid Other | Admitting: Obstetrics & Gynecology

## 2021-05-21 VITALS — BP 119/72 | HR 107 | Wt 176.5 lb

## 2021-05-21 DIAGNOSIS — Z3A32 32 weeks gestation of pregnancy: Secondary | ICD-10-CM

## 2021-05-21 DIAGNOSIS — Z3480 Encounter for supervision of other normal pregnancy, unspecified trimester: Secondary | ICD-10-CM

## 2021-05-21 LAB — POCT URINALYSIS DIPSTICK OB
Blood, UA: NEGATIVE
Glucose, UA: NEGATIVE
Ketones, UA: NEGATIVE
Leukocytes, UA: NEGATIVE
Nitrite, UA: NEGATIVE
POC,PROTEIN,UA: NEGATIVE

## 2021-05-21 NOTE — Progress Notes (Signed)
LOW-RISK PREGNANCY VISIT Patient name: Patricia Richards MRN [id number]  Date of birth: [birth year] Chief Complaint:   Routine Prenatal Visit (+ pressure; lost mucus plug)  History of Present Illness:   Patricia Richards is a 31 y.o. [id number] female at [redacted]w[redacted]d with an Estimated Date of Delivery: 07/16/21 being seen today for ongoing management of a low-risk pregnancy.  Depression screen [facility] 2/9 04/07/2021 02/13/2019 07/26/2018 03/02/2018 01/27/2017  Decreased Interest 1 0 2 1 1   Down, Depressed, Hopeless 1 0 2 1 0  PHQ - 2 Score 2 0 4 2 1   Altered sleeping 1 - - 1 1  Tired, decreased energy 3 - - 2 2  Change in appetite 2 - - 0 0  Feeling bad or failure about yourself  2 - - 1 2  Trouble concentrating 2 - - 1 0  Moving slowly or fidgety/restless 3 - - 0 1  Suicidal thoughts 0 - - 0 0  PHQ-9 Score 15 - - 7 7  Some recent data might be hidden    Today she reports pelvic floor pain. Contractions: Irritability. Vag. Bleeding: None.  Movement: Present. denies leaking of fluid. Review of Systems:   Pertinent items are noted in HPI Denies abnormal vaginal discharge w/ itching/odor/irritation, headaches, visual changes, shortness of breath, chest pain, abdominal pain, severe nausea/vomiting, or problems with urination or bowel movements unless otherwise stated above. Pertinent History Reviewed:  Reviewed past medical,surgical, social, obstetrical and family history.  Reviewed problem list, medications and allergies. Physical Assessment:   Vitals:   05/21/21 1010  BP: 119/72  Pulse: (!) 107  Weight: 176 lb 8 oz (80.1 kg)  Body mass index is 32.28 kg/m.        Physical Examination:   General appearance: Well appearing, and in no distress  Mental status: Alert, oriented to person, place, and time  Skin: Warm & dry  Cardiovascular: Normal heart rate noted  Respiratory: Normal respiratory effort, no distress  Abdomen: Soft, gravid, nontender  Pelvic: Cervical exam performed  Dilation: Fingertip  Effacement (%): Thick Station: -3  Extremities: Edema: Trace  Fetal Status: Fetal Heart Rate (bpm): 144 Fundal Height: 32 cm Movement: Present Presentation: Vertex  Chaperone: n/a    Results for orders placed or performed in visit on 05/21/21 (from the past 24 hour(s))  POC Urinalysis Dipstick OB   Collection Time: 05/21/21 10:14 AM  Result Value Ref Range   Color, UA     Clarity, UA     Glucose, UA Negative Negative   Bilirubin, UA     Ketones, UA neg    Spec Grav, UA     Blood, UA neg    pH, UA     POC,PROTEIN,UA Negative Negative, Trace, Small (1+), Moderate (2+), Large (3+), 4+   Urobilinogen, UA     Nitrite, UA neg    Leukocytes, UA Negative Negative   Appearance     Odor      Assessment & Plan:  1) Low-risk pregnancy [id number] at [redacted]w[redacted]d with an Estimated Date of Delivery: 07/16/21   2) Pelvic floor pain due to multiparity, cervical exam is normal,    Meds: No orders of the defined types were placed in this encounter.  Labs/procedures today:   Plan:  Continue routine obstetrical care  Next visit: prefers in person    Reviewed: Preterm labor symptoms and general obstetric precautions including but not limited to vaginal bleeding, contractions, leaking of fluid and fetal movement were reviewed in detail with  the patient.  All questions were answered. Has home bp cuff. Rx faxed to . Check bp weekly, let us know if >140/90.   Follow-up: Return in about 2 weeks (around 06/04/2021).  Orders Placed This Encounter  Procedures   POC Urinalysis Dipstick OB    Florian Buff, MD 05/21/2021 10:32 AM

## 2021-05-23 ENCOUNTER — Emergency Department (HOSPITAL_COMMUNITY)
Admission: EM | Admit: 2021-05-23 | Discharge: 2021-05-23 | Disposition: A | Discharge status: Home / Self Care | Payer: Medicaid Other | Attending: Emergency Medicine | Admitting: Emergency Medicine

## 2021-05-23 ENCOUNTER — Other Ambulatory Visit: Payer: Self-pay

## 2021-05-23 ENCOUNTER — Encounter (HOSPITAL_COMMUNITY): Payer: Self-pay | Admitting: Emergency Medicine

## 2021-05-23 DIAGNOSIS — Z3493 Encounter for supervision of normal pregnancy, unspecified, third trimester: Secondary | ICD-10-CM

## 2021-05-23 DIAGNOSIS — R319 Hematuria, unspecified: Secondary | ICD-10-CM

## 2021-05-23 DIAGNOSIS — Z3A32 32 weeks gestation of pregnancy: Secondary | ICD-10-CM | POA: Diagnosis not present

## 2021-05-23 DIAGNOSIS — O26893 Other specified pregnancy related conditions, third trimester: Secondary | ICD-10-CM | POA: Diagnosis not present

## 2021-05-23 LAB — URINALYSIS, ROUTINE W REFLEX MICROSCOPIC
Bacteria, UA: NONE SEEN
Bilirubin Urine: NEGATIVE
Glucose, UA: NEGATIVE mg/dL
Ketones, ur: 5 mg/dL — AB
Leukocytes,Ua: NEGATIVE
Nitrite: NEGATIVE
Protein, ur: 30 mg/dL — AB
RBC / HPF: >50 RBC/hpf — ABNORMAL HIGH (ref 0–5)
Specific Gravity, Urine: 1.023 (ref 1.005–1.030)
pH: 6 (ref 5.0–8.0)

## 2021-05-23 LAB — BASIC METABOLIC PANEL
Anion gap: 7 (ref 5–15)
BUN: 7 mg/dL (ref 6–20)
CO2: 22 mmol/L (ref 22–32)
Calcium: 8.4 mg/dL — ABNORMAL LOW (ref 8.9–10.3)
Chloride: 107 mmol/L (ref 98–111)
Creatinine, Ser: 0.49 mg/dL (ref 0.44–1.00)
GFR, Estimated: >60 mL/min (ref >60)
Glucose, Bld: 92 mg/dL (ref 70–99)
Potassium: 3.8 mmol/L (ref 3.5–5.1)
Sodium: 136 mmol/L (ref 135–145)

## 2021-05-23 LAB — CBC WITH DIFFERENTIAL/PLATELET
Abs Immature Granulocytes: 0.06 10*3/uL (ref 0.00–0.07)
Basophils Absolute: 0 10*3/uL (ref 0.0–0.1)
Basophils Relative: 0 %
Eosinophils Absolute: 0.1 10*3/uL (ref 0.0–0.5)
Eosinophils Relative: 1 %
HCT: 31.6 % — ABNORMAL LOW (ref 36.0–46.0)
Hemoglobin: 9.9 g/dL — ABNORMAL LOW (ref 12.0–15.0)
Immature Granulocytes: 1 %
Lymphocytes Relative: 26 %
Lymphs Abs: 1.9 10*3/uL (ref 0.7–4.0)
MCH: 28.4 pg (ref 26.0–34.0)
MCHC: 31.3 g/dL (ref 30.0–36.0)
MCV: 90.5 fL (ref 80.0–100.0)
Monocytes Absolute: 0.5 10*3/uL (ref 0.1–1.0)
Monocytes Relative: 7 %
Neutro Abs: 4.8 10*3/uL (ref 1.7–7.7)
Neutrophils Relative %: 65 %
Platelets: 177 10*3/uL (ref 150–400)
RBC: 3.49 MIL/uL — ABNORMAL LOW (ref 3.87–5.11)
RDW: 14.7 % (ref 11.5–15.5)
WBC: 7.3 10*3/uL (ref 4.0–10.5)
nRBC: 0 % (ref 0.0–0.2)

## 2021-05-23 MED ORDER — HYDROCODONE-ACETAMINOPHEN 5-325 MG PO TABS: 1.0000 | ORAL_TABLET | Freq: Four times a day (QID) | ORAL | 0 refills | Status: DC | PRN

## 2021-05-23 NOTE — Progress Notes (Signed)
Spoke with Dr. Ilda Basset. Pt is a G9P6, all vaginal deliveries. All babies were born at term. She presents to [facility] with c/o flank pain and hematuria. Pt is 32 2/[redacted] weeks gestation. No vaginal bleeding or leaking of fluid. FHR tracing is reactive and there are no uc's tracing. Dr. Ilda Basset gave me the following orders: CBC, BMP, he wants the [facility] providers to check the pt's creatine and white count. He wants the U/A for culture. The U/A resulted showing a lg amt of hemaglobin. If the pt has a UTI, he wants the pt treated with Duracef 500mg  po twice daily for 1 week. They can send the pt home with percocet and phenergan for nausea if she needs it. They can also prescribe Flomax. [place] PA notified of the orders and that the pt can be OB cleared.

## 2021-05-23 NOTE — ED Notes (Signed)
Pt on toco monitor and FHR [place], Marion Rosenberry with [facility] called and placed pt on monitoring. Fetal movement noted every few seconds. Difficulty maintaining FHR position noted d/t movement. When relocated, FHR remains between 137-155 on monitor

## 2021-05-23 NOTE — ED Triage Notes (Signed)
Pt arrives via EMS from home with complaints right sided flank / low back pain along with mild hematuria that started yesterday. Hx of kidney stones. [redacted] weeks pregnant.

## 2021-05-23 NOTE — Discharge Instructions (Addendum)
It was a pleasure taking care of you today!   Your urine did show blood, however, no infection noted. Your urine culture will result in 24-48 hours. You will be able to see the results on MyChart. Per the OB recommendations, you will be sent home with Lortab.  Continue taking your Phenergan as prescribed.  Do not drive or operate heavy machinery with the opioid.  Do not take Tylenol with this medication.  You may follow up with your primary care provider as needed. You may follow up with your OB as scheduled. Return to the ED if you are experiencing increasing/worsening abdominal pain, contractions, vaginal bleeding, or worsening symptoms.

## 2021-05-23 NOTE — Progress Notes (Signed)
Received call from [place], Patricia Richards. Pt is a G9P6 at 5 2/[redacted] weeks gestation presenting with c/o flank pain and hematuria. No c/o uc's, vaginal bleeding, leaking of fluid. She has delivered all of her babies at 87 and 40 weeks vaginally. 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

## 2021-05-23 NOTE — ED Provider Notes (Signed)
[facility] Provider Note   CSN: [id number] Arrival date & time: 05/23/21  1032     History  Chief Complaint  Patient presents with   Flank Pain   Hematuria    Patricia Richards is a 31 y.o. female who is G9P7 who presents to the ED today with concerns for flank pain onset today PTA.  She was told in June 2022 that she had kidney stones.  She has associated hematuria onset today.  She has tried Tylenol for her symptoms. Denies fever, chills, nausea, vomiting, chest pain, shortness of breath, dysuria.   Per patient chart review: Patient was evaluated at her OB on 05/21/2021.  At that time she noted pelvic floor pain.  However denied vaginal bleeding.  Urine dipstick in the office was negative for hemoglobin.  The history is provided by the patient. No language interpreter was used.      Home Medications Prior to Admission medications   Medication Sig Start Date End Date Taking? Authorizing Provider  acyclovir (ZOVIRAX) 400 MG tablet Take 1 tablet (400 mg total) by mouth 3 (three) times daily. 04/06/21  Yes Booker, Royetta Crochet, CNM  clonazePAM (KLONOPIN) 1 MG tablet TAKE (1) TABLET BY MOUTH THREE TIMES DAILY 05/05/21  Yes Florian Buff, MD  famotidine (PEPCID) 20 MG tablet Take 1 tablet (20 mg total) by mouth 2 (two) times daily. 01/06/21  Yes Roma Schanz, CNM  HYDROcodone-acetaminophen (NORCO/VICODIN) 5-325 MG tablet Take 1 tablet by mouth every 6 (six) hours as needed. 05/23/21  Yes Ellianne Gowen A, PA-C  meclizine (ANTIVERT) 50 MG tablet Take 1 tablet (50 mg total) by mouth 3 (three) times daily as needed. 05/05/21  Yes Florian Buff, MD  Blood Pressure Monitor MISC For regular home bp monitoring during pregnancy 01/05/21   Roma Schanz, CNM  promethazine (PHENERGAN) 25 MG tablet Take 0.5-1 tablets (12.5-25 mg total) by mouth every 6 (six) hours as needed for nausea or vomiting. Patient not taking: Reported on 05/23/2021 01/05/21   Roma Schanz, CNM       Allergies    Metronidazole, Tramadol, Reglan [metoclopramide hcl], and Toradol [ketorolac tromethamine]    Review of Systems   Review of Systems  Constitutional:  Negative for chills and fever.  Respiratory:  Negative for shortness of breath.   Cardiovascular:  Negative for chest pain.  Gastrointestinal:  Negative for abdominal pain, nausea and vomiting.  Genitourinary:  Positive for flank pain and hematuria. Negative for dysuria.  Musculoskeletal:  Positive for back pain.  Skin:  Negative for rash.  All other systems reviewed and are negative.  Physical Exam Updated Vital Signs BP 111/76    Pulse 93    Temp 97.9 F (36.6 C) (Oral)    Resp 20    Ht 5\' 2"  (1.575 m)    Wt 80.1 kg    LMP 10/09/2020 (Exact Date)    SpO2 97%    BMI 32.28 kg/m  Physical Exam Vitals and nursing note reviewed.  Constitutional:      General: She is not in acute distress.    Appearance: She is not diaphoretic.  HENT:     Head: Normocephalic and atraumatic.     Mouth/Throat:     Pharynx: No oropharyngeal exudate.  Eyes:     General: No scleral icterus.    Conjunctiva/sclera: Conjunctivae normal.  Cardiovascular:     Rate and Rhythm: Normal rate and regular rhythm.     Pulses: Normal pulses.  Heart sounds: Normal heart sounds.  Pulmonary:     Effort: Pulmonary effort is normal. No respiratory distress.     Breath sounds: Normal breath sounds. No wheezing.  Abdominal:     General: Bowel sounds are normal.     Palpations: Abdomen is soft. There is no mass.     Tenderness: There is no abdominal tenderness. There is left CVA tenderness. There is no right CVA tenderness, guarding or rebound.     Comments: Gravid abdomen. No overlying skin changes. Mild left CVA TTP  Musculoskeletal:        General: Normal range of motion.     Cervical back: Normal range of motion and neck supple.  Skin:    General: Skin is warm and dry.  Neurological:     Mental Status: She is alert.  Psychiatric:         Behavior: Behavior normal.    ED Results / Procedures / Treatments   Labs (all labs ordered are listed, but only abnormal results are displayed) Labs Reviewed  URINALYSIS, ROUTINE W REFLEX MICROSCOPIC - Abnormal; Notable for the following components:      Result Value   APPearance HAZY (*)    Hgb urine dipstick LARGE (*)    Ketones, ur 5 (*)    Protein, ur 30 (*)    RBC / HPF >50 (*)    All other components within normal limits  BASIC METABOLIC PANEL - Abnormal; Notable for the following components:   Calcium 8.4 (*)    All other components within normal limits  CBC WITH DIFFERENTIAL/PLATELET - Abnormal; Notable for the following components:   RBC 3.49 (*)    Hemoglobin 9.9 (*)    HCT 31.6 (*)    All other components within normal limits  URINE CULTURE    EKG None  Radiology No results found.  Procedures Procedures    Medications Ordered in ED Medications - No data to display  ED Course/ Medical Decision Making/ A&P Clinical Course as of 05/23/21 1932  Sat May 23, 2021  1330 Ob rapid response. Stanton Kidney, RN Talked with OB Dr. Ilda Basset.  Cbc, bmp, urine culture. Rx Percocet and phenergan for nausea. Flomax for 1 week. [SB]  3532 Notified by RN the patient went to the bathroom and her urine was clear and yellow. [SB]  9924 Patient notified of lab findings. Discussed discharge treatment plan with pt at bedside. Pt agreeable. [SB]  1455 Pt with allergy to percocet, will discharge home with tylenol. [SB]  1517 Consult with OB, Dr. Ilda Basset who recommended Lortab or hydrocodone only (10 tablets) and to continue with phenergan at home. [SB]  1531 Patient notified of OB recommendations and agreeable at this time.  She notes that pain medication makes her feel weird which is why she does not take it often, however she takes it when she needs to.  Patient appears safe for discharge at this time. [SB]    Clinical Course User Index [SB] Breiana Stratmann A, PA-C                            Medical Decision Making  Pt is G9P7 who presents to the ED with concerns for flank pain onset today PTA. [redacted] weeks gestation. Vital signs stable, afebrile. Pt notes that she was diagnosed with kidney stones in June 2022. On exam patient with left CVA tenderness.  No cardiovascular or respiratory exam findings.  Differential diagnosis includes acute cystitis,  nephrolithiasis, pyelonephritis.  Patient placed on fetal heart monitor and monitored by rapid OB, no contractions noted, fetal heart rate ranging between 130s to mid 150s.  Talked with OB, Dr. Ilda Basset who recommends Percocet, Phenergan, Flomax.  Labs:  I ordered, and personally interpreted labs.  The pertinent results include: BMP, CBC unremarkable.  Urinalysis notable for large amount of hemoglobin, no leukocytosis.  Disposition: Pt presentation suspicious for kidney stone that has passed. After consideration of the diagnostic results and the patients response to treatment, I feel that the patent would benefit from discharge home with Lortab per OB recommendations.  Discussed with patient and OB recommendations, patient agreeable to Lortab at this time.  She has Phenergan at home and will take that.  Supportive care measures and strict return precautions discussed with patient at bedside. Pt acknowledges and verbalizes understanding. Pt appears safe for discharge. Follow up as indicated in discharge paperwork.   This chart was dictated using voice recognition software, Dragon. Despite the best efforts of this provider to proofread and correct errors, errors may still occur which can change documentation meaning.  Final Clinical Impression(s) / ED Diagnoses Final diagnoses:  Hematuria, unspecified type  Normal pregnancy in third trimester    Rx / DC Orders ED Discharge Orders          Ordered    HYDROcodone-acetaminophen (NORCO/VICODIN) 5-325 MG tablet  Every 6 hours PRN        05/23/21 1533              Nyhla Mountjoy A,  PA-C 05/23/21 1940    Davonna Belling, MD 05/25/21 [phone]

## 2021-05-23 NOTE — ED Notes (Signed)
Pt states that her urine is now clear, PA notified.

## 2021-05-23 NOTE — Progress Notes (Signed)
Spoke with Patricia Richards. Says she is trying to get the baby on the monitor. She says there is a lot of fetal movement. Says she will get someone to help her with the fetal monitor.

## 2021-05-24 ENCOUNTER — Encounter: Payer: Self-pay | Admitting: Obstetrics & Gynecology

## 2021-05-25 LAB — URINE CULTURE

## 2021-05-27 ENCOUNTER — Encounter: Payer: Self-pay | Admitting: Women's Health

## 2021-06-04 ENCOUNTER — Encounter: Payer: Self-pay | Admitting: Obstetrics & Gynecology

## 2021-06-04 ENCOUNTER — Other Ambulatory Visit: Payer: Self-pay

## 2021-06-04 ENCOUNTER — Ambulatory Visit (INDEPENDENT_AMBULATORY_CARE_PROVIDER_SITE_OTHER): Payer: Medicaid Other | Admitting: Obstetrics & Gynecology

## 2021-06-04 VITALS — BP 109/72 | HR 91 | Wt 176.0 lb

## 2021-06-04 DIAGNOSIS — R399 Unspecified symptoms and signs involving the genitourinary system: Secondary | ICD-10-CM

## 2021-06-04 DIAGNOSIS — Z3480 Encounter for supervision of other normal pregnancy, unspecified trimester: Secondary | ICD-10-CM

## 2021-06-04 LAB — POCT URINALYSIS DIPSTICK OB
Blood, UA: NEGATIVE
Glucose, UA: NEGATIVE
Ketones, UA: NEGATIVE
Leukocytes, UA: NEGATIVE
Nitrite, UA: NEGATIVE

## 2021-06-04 NOTE — Progress Notes (Signed)
HIGH-RISK PREGNANCY VISIT Patient name: Patricia Richards MRN [id number]  Date of birth: [birth year] Chief Complaint:   Routine Prenatal Visit  History of Present Illness:   Patricia Richards is a 31 y.o. [id number] female at [redacted]w[redacted]d with an Estimated Date of Delivery: 07/16/21 being seen today for ongoing management of a high-risk pregnancy complicated by chronic benzodiazepine use.    Today she reports some near syncopal episodes.  Not associated with palpitations or anxiety/panic attacks, or tachypnea Contractions: Irritability. Vag. Bleeding: None.  Movement: Present. denies leaking of fluid.   Depression screen [facility] 2/9 04/07/2021 02/13/2019 07/26/2018 03/02/2018 01/27/2017  Decreased Interest 1 0 2 1 1   Down, Depressed, Hopeless 1 0 2 1 0  PHQ - 2 Score 2 0 4 2 1   Altered sleeping 1 - - 1 1  Tired, decreased energy 3 - - 2 2  Change in appetite 2 - - 0 0  Feeling bad or failure about yourself  2 - - 1 2  Trouble concentrating 2 - - 1 0  Moving slowly or fidgety/restless 3 - - 0 1  Suicidal thoughts 0 - - 0 0  PHQ-9 Score 15 - - 7 7  Some recent data might be hidden     GAD 7 : Generalized Anxiety Score 04/07/2021  Nervous, Anxious, on Edge 3  Control/stop worrying 3  Worry too much - different things 3  Trouble relaxing 2  Restless 2  Easily annoyed or irritable 3  Afraid - awful might happen 3  Total GAD 7 Score 19     Review of Systems:   Pertinent items are noted in HPI Denies abnormal vaginal discharge w/ itching/odor/irritation, headaches, visual changes, shortness of breath, chest pain, abdominal pain, severe nausea/vomiting, or problems with urination or bowel movements unless otherwise stated above. Pertinent History Reviewed:  Reviewed past medical,surgical, social, obstetrical and family history.  Reviewed problem list, medications and allergies. Physical Assessment:   Vitals:   06/04/21 1151  BP: 109/72  Pulse: 91  Weight: 176 lb (79.8 kg)  Body mass index is  32.19 kg/m.           Physical Examination:   General appearance: alert, well appearing, and in no distress  Mental status: alert, oriented to person, place, and time  Skin: warm & dry   Extremities: Edema: Trace    Cardiovascular: normal heart rate noted  Respiratory: normal respiratory effort, no distress  Abdomen: gravid, soft, non-tender  Pelvic: Cervical exam deferred         Fetal Status: Fetal Heart Rate (bpm): 140 Fundal Height: 34 cm Movement: Present    Fetal Surveillance Testing today: FHR 140   Chaperone: N/A    Results for orders placed or performed in visit on 06/04/21 (from the past 24 hour(s))  POC Urinalysis Dipstick OB   Collection Time: 06/04/21 11:57 AM  Result Value Ref Range   Color, UA     Clarity, UA     Glucose, UA Negative Negative   Bilirubin, UA     Ketones, UA neg    Spec Grav, UA     Blood, UA neg    pH, UA     POC,PROTEIN,UA Trace Negative, Trace, Small (1+), Moderate (2+), Large (3+), 4+   Urobilinogen, UA     Nitrite, UA neg    Leukocytes, UA Negative Negative   Appearance     Odor      Assessment & Plan:  High-risk pregnancy: [id number] at [redacted]w[redacted]d with  an Estimated Date of Delivery: 07/16/21   1) Chronic long term benzodiazepine use, on klonopin   Meds: No orders of the defined types were placed in this encounter.   Labs/procedures today: none  Treatment Plan:      Follow-up: Return in about 2 weeks (around 06/18/2021) for [place].   No future appointments.  Orders Placed This Encounter  Procedures   POC Urinalysis Dipstick OB   Florian Buff  Attending Physician for the [facility] 06/04/2021 12:06 PM

## 2021-06-05 ENCOUNTER — Encounter: Payer: Self-pay | Admitting: Obstetrics & Gynecology

## 2021-06-15 ENCOUNTER — Encounter: Payer: Self-pay | Admitting: Women's Health

## 2021-06-18 ENCOUNTER — Encounter: Payer: Self-pay | Admitting: Women's Health

## 2021-06-18 ENCOUNTER — Other Ambulatory Visit: Payer: Self-pay

## 2021-06-18 ENCOUNTER — Ambulatory Visit (INDEPENDENT_AMBULATORY_CARE_PROVIDER_SITE_OTHER): Payer: Medicaid Other | Admitting: Women's Health

## 2021-06-18 VITALS — BP 115/67 | HR 97 | Wt 176.0 lb

## 2021-06-18 DIAGNOSIS — Z3A36 36 weeks gestation of pregnancy: Secondary | ICD-10-CM

## 2021-06-18 DIAGNOSIS — R55 Syncope and collapse: Secondary | ICD-10-CM

## 2021-06-18 DIAGNOSIS — O0993 Supervision of high risk pregnancy, unspecified, third trimester: Secondary | ICD-10-CM

## 2021-06-18 DIAGNOSIS — Z3483 Encounter for supervision of other normal pregnancy, third trimester: Secondary | ICD-10-CM

## 2021-06-18 LAB — POCT URINALYSIS DIPSTICK OB
Blood, UA: NEGATIVE
Glucose, UA: NEGATIVE
Ketones, UA: NEGATIVE
Leukocytes, UA: NEGATIVE
Nitrite, UA: NEGATIVE
POC,PROTEIN,UA: NEGATIVE

## 2021-06-18 LAB — OB RESULTS CONSOLE GC/CHLAMYDIA: Gonorrhea: NEGATIVE

## 2021-06-18 LAB — POCT HEMOGLOBIN: Hemoglobin: 10.9 g/dL — AB (ref 11–14.6)

## 2021-06-18 NOTE — Progress Notes (Signed)
LOW-RISK PREGNANCY VISIT Patient name: Patricia Richards MRN [id number]  Date of birth: [birth year] Chief Complaint:   Routine Prenatal Visit  History of Present Illness:   Patricia Richards is a 31 y.o. [id number] female at [redacted]w[redacted]d with an Estimated Date of Delivery: 07/16/21 being seen today for ongoing management of a low-risk pregnancy.   Today she reports  feels like she is going to 'black out' every day, has to have someone stay w/ her. Wants elective IOL @ 39wks d/t social reasons. Does not want FOB at hospital or to know she's there . Recent +gc/ct throat/vagina/rectum per pt at [facility] on 1/20. Finished meds, has not had sex w/ the person she got it from since.  Contractions: Irritability. Vag. Bleeding: None.  Movement: Present. denies leaking of fluid.  Depression screen [facility] 2/9 04/07/2021 02/13/2019 07/26/2018 03/02/2018 01/27/2017  Decreased Interest 1 0 2 1 1   Down, Depressed, Hopeless 1 0 2 1 0  PHQ - 2 Score 2 0 4 2 1   Altered sleeping 1 - - 1 1  Tired, decreased energy 3 - - 2 2  Change in appetite 2 - - 0 0  Feeling bad or failure about yourself  2 - - 1 2  Trouble concentrating 2 - - 1 0  Moving slowly or fidgety/restless 3 - - 0 1  Suicidal thoughts 0 - - 0 0  PHQ-9 Score 15 - - 7 7  Some recent data might be hidden     GAD 7 : Generalized Anxiety Score 04/07/2021  Nervous, Anxious, on Edge 3  Control/stop worrying 3  Worry too much - different things 3  Trouble relaxing 2  Restless 2  Easily annoyed or irritable 3  Afraid - awful might happen 3  Total GAD 7 Score 19      Review of Systems:   Pertinent items are noted in HPI Denies abnormal vaginal discharge w/ itching/odor/irritation, headaches, visual changes, shortness of breath, chest pain, abdominal pain, severe nausea/vomiting, or problems with urination or bowel movements unless otherwise stated above. Pertinent History Reviewed:  Reviewed past medical,surgical, social, obstetrical and family history.  Reviewed  problem list, medications and allergies. Physical Assessment:   Vitals:   06/18/21 1206  BP: 115/67  Pulse: 97  Weight: 176 lb (79.8 kg)  Body mass index is 32.19 kg/m.        Physical Examination:   General appearance: Well appearing, and in no distress  Mental status: Alert, oriented to person, place, and time  Skin: Warm & dry  Cardiovascular: Normal heart rate noted  Respiratory: Normal respiratory effort, no distress  Abdomen: Soft, gravid, nontender  Pelvic: Cervical exam performed  Dilation: 1 Effacement (%): Thick Station: Ballotable  Extremities: Edema: Trace  Fetal Status: Fetal Heart Rate (bpm): 148 Fundal Height: 36 cm Movement: Present Presentation: Vertex  Chaperone: Marcelino Scot   Results for orders placed or performed in visit on 06/18/21 (from the past 24 hour(s))  POC Urinalysis Dipstick OB   Collection Time: 06/18/21 12:09 PM  Result Value Ref Range   Color, UA     Clarity, UA     Glucose, UA Negative Negative   Bilirubin, UA     Ketones, UA neg    Spec Grav, UA     Blood, UA neg    pH, UA     POC,PROTEIN,UA Negative Negative, Trace, Small (1+), Moderate (2+), Large (3+), 4+   Urobilinogen, UA     Nitrite, UA neg  Leukocytes, UA Negative Negative   Appearance     Odor    POCT hemoglobin   Collection Time: 06/18/21 12:34 PM  Result Value Ref Range   Hemoglobin 10.9 (A) 11 - 14.6 g/dL    Fingerstick hgb: 10.9  Assessment & Plan:  1) Low-risk pregnancy [id number] at [redacted]w[redacted]d with an Estimated Date of Delivery: 07/16/21   2) Chronic long term benzodiazepine use, klonopin  3) Recent +gc/ct at [facility] on 1/20> too early to retest today, plan POC at earliest on 2/10  4) Syncopal/near syncopal episodes> per pt, is on antivert for vertigo, fingerstick hgb today 10.9, continue Fe   Meds: No orders of the defined types were placed in this encounter.  Labs/procedures today: GBS, SVE, and fingerstick hgb  Plan:  Continue routine obstetrical care  Next visit:  prefers in person    Reviewed: Preterm labor symptoms and general obstetric precautions including but not limited to vaginal bleeding, contractions, leaking of fluid and fetal movement were reviewed in detail with the patient.  All questions were answered. Does have home bp cuff. Office bp cuff given: not applicable. Check bp weekly, let us know if consistently >140 and/or >90.  Follow-up: Return for weekly, MD or CNM, in person, LROB.  No future appointments.  Orders Placed This Encounter  Procedures   Strep Gp B NAA   POC Urinalysis Dipstick OB   POCT hemoglobin   Roma Schanz CNM, [facility] 06/18/2021 12:35 PM

## 2021-06-18 NOTE — Patient Instructions (Signed)
Timmya, thank you for choosing our office today! We appreciate the opportunity to meet your healthcare needs. You may receive a short survey by mail, e-mail, or through [employer]. If you are happy with your care we would appreciate if you could take just a few minutes to complete the survey questions. We read all of your comments and take your feedback very seriously. Thank you again for choosing our office.  [facility] [employer] Team at [place] at [facility] ([place], [place] [address]) Entrance C, located off of [place] parking   CLASSES: Go to [email or web address] to register for classes (childbirth, breastfeeding, waterbirth, infant CPR, daddy bootcamp, etc.)  Call the office [phone]) or go to [facility] if: You begin to have strong, frequent contractions Your water breaks.  Sometimes it is a big gush of fluid, sometimes it is just a trickle that keeps getting your panties wet or running down your legs You have vaginal bleeding.  It is normal to have a small amount of spotting if your cervix was checked.  You don't feel your baby moving like normal.  If you don't, get you something to eat and drink and lay down and focus on feeling your baby move.   If your baby is still not moving like normal, you should call the office or go to [facility].  Call the office [phone]) or go to [facility] hospital for these signs of pre-eclampsia: Severe headache that does not go away with Tylenol Visual changes- seeing spots, double, blurred vision Pain under your right breast or upper abdomen that does not go away with Tums or heartburn medicine Nausea and/or vomiting Severe swelling in your hands, feet, and face   Tdap Vaccine It is recommended that you get the Tdap vaccine during the third trimester of EACH pregnancy to help protect your baby from getting pertussis (whooping cough) 27-36 weeks is the BEST time to do  this so that you can pass the protection on to your baby. During pregnancy is better than after pregnancy, but if you are unable to get it during pregnancy it will be offered at the hospital.  You can get this vaccine with [place], at the health department, your family doctor, or some local pharmacies Everyone who will be around your baby should also be up-to-date on their vaccines before the baby comes. Adults (who are not pregnant) only need 1 dose of Tdap during adulthood.   [facility] Pediatricians/Family Doctors [facility]): [address] [place], [facility]: [address] [place], [phone]                [facility]): [place], [phone] (call to ask if accepting patients) [facility] Department: [address], [place], [place] Pediatricians/Family Doctors [facility]): [place] [place], [address], [place] [facility]: [address] [place], [facility]: [place], [place] [facility]): [phone] [facility]: [address], [place]: [address], [place] [facility]: [phone], [phone]  Home Blood Pressure Monitoring for Patients   Your provider has recommended that you check your  blood pressure (BP) at least once a week at home. If you do not have a blood pressure cuff at home, one will be provided for you. Contact your provider if you have not received your monitor within 1 week.   Helpful Tips for Accurate Home Blood Pressure Checks  Don't smoke, exercise, or drink caffeine 30 minutes before checking your BP Use the restroom before checking your BP (a full bladder can raise your  pressure) Relax in a comfortable upright chair Feet on the ground Left arm resting comfortably on a flat surface at the level of your heart Legs uncrossed Back supported Sit quietly and don't talk Place the cuff on your bare arm Adjust snuggly, so that only two fingertips can fit between your skin and the top of the cuff Check 2 readings separated by at least one minute Keep a log of your BP readings For a visual, please reference this diagram: http://ccnc.care/bpdiagram  Provider Name: [facility]     Phone: [phone]  Zone 1: ALL CLEAR  Continue to monitor your symptoms:  BP reading is less than 140 (top number) or less than 90 (bottom number)  No right upper stomach pain No headaches or seeing spots No feeling nauseated or throwing up No swelling in face and hands  Zone 2: CAUTION Call your doctor's office for any of the following:  BP reading is greater than 140 (top number) or greater than 90 (bottom number)  Stomach pain under your ribs in the middle or right side Headaches or seeing spots Feeling nauseated or throwing up Swelling in face and hands  Zone 3: EMERGENCY  Seek immediate medical care if you have any of the following:  BP reading is greater than160 (top number) or greater than 110 (bottom number) Severe headaches not improving with Tylenol Serious difficulty catching your breath Any worsening symptoms from Zone 2  Preterm Labor and Birth Information  The normal length of a pregnancy is 39-41 weeks. Preterm labor is when labor starts before 37 completed weeks of pregnancy. What are the risk factors for preterm labor? Preterm labor is more likely to occur in women who: Have certain infections during pregnancy such as a bladder infection, sexually transmitted infection, or infection inside the uterus (chorioamnionitis). Have a shorter-than-normal cervix. Have gone into preterm labor before. Have had surgery on their cervix. Are younger than age 27  or older than age 7. Are African American. Are pregnant with twins or multiple babies (multiple gestation). Take street drugs or smoke while pregnant. Do not gain enough weight while pregnant. Became pregnant shortly after having been pregnant. What are the symptoms of preterm labor? Symptoms of preterm labor include: Cramps similar to those that can happen during a menstrual period. The cramps may happen with diarrhea. Pain in the abdomen or lower back. Regular uterine contractions that may feel like tightening of the abdomen. A feeling of increased pressure in the pelvis. Increased watery or bloody mucus discharge from the vagina. Water breaking (ruptured amniotic sac). Why is it important to recognize signs of preterm labor? It is important to recognize signs of preterm labor because babies who are born prematurely may not be fully developed. This can put them at an increased risk for: Long-term (chronic) heart and lung problems. Difficulty immediately after birth with regulating body systems, including blood sugar, body temperature, heart rate, and breathing rate. Bleeding in the brain. Cerebral palsy. Learning difficulties. Death. These risks are highest for babies who are born before 31 weeks  of pregnancy. How is preterm labor treated? Treatment depends on the length of your pregnancy, your condition, and the health of your baby. It may involve: Having a stitch (suture) placed in your cervix to prevent your cervix from opening too early (cerclage). Taking or being given medicines, such as: Hormone medicines. These may be given early in pregnancy to help support the pregnancy. Medicine to stop contractions. Medicines to help mature the babys lungs. These may be prescribed if the risk of delivery is high. Medicines to prevent your baby from developing cerebral palsy. If the labor happens before 34 weeks of pregnancy, you may need to stay in the hospital. What should I do if I  think I am in preterm labor? If you think that you are going into preterm labor, call your health care provider right away. How can I prevent preterm labor in future pregnancies? To increase your chance of having a full-term pregnancy: Do not use any tobacco products, such as cigarettes, chewing tobacco, and e-cigarettes. If you need help quitting, ask your health care provider. Do not use street drugs or medicines that have not been prescribed to you during your pregnancy. Talk with your health care provider before taking any herbal supplements, even if you have been taking them regularly. Make sure you gain a healthy amount of weight during your pregnancy. Watch for infection. If you think that you might have an infection, get it checked right away. Make sure to tell your health care provider if you have gone into preterm labor before. This information is not intended to replace advice given to you by your health care provider. Make sure you discuss any questions you have with your health care provider. Document Revised: 08/25/2018 Document Reviewed: 09/24/2015 Elsevier Patient Education  [place].

## 2021-06-20 LAB — STREP GP B NAA: Strep Gp B NAA: POSITIVE — AB

## 2021-06-25 ENCOUNTER — Other Ambulatory Visit: Payer: Self-pay

## 2021-06-25 ENCOUNTER — Ambulatory Visit (INDEPENDENT_AMBULATORY_CARE_PROVIDER_SITE_OTHER): Payer: Medicaid Other | Admitting: Advanced Practice Midwife

## 2021-06-25 ENCOUNTER — Telehealth (HOSPITAL_COMMUNITY): Payer: Self-pay | Admitting: *Deleted

## 2021-06-25 ENCOUNTER — Encounter (HOSPITAL_COMMUNITY): Payer: Self-pay | Admitting: *Deleted

## 2021-06-25 ENCOUNTER — Encounter: Payer: Self-pay | Admitting: Advanced Practice Midwife

## 2021-06-25 VITALS — BP 105/71 | HR 104 | Wt 182.0 lb

## 2021-06-25 DIAGNOSIS — Z3A37 37 weeks gestation of pregnancy: Secondary | ICD-10-CM

## 2021-06-25 DIAGNOSIS — Z3480 Encounter for supervision of other normal pregnancy, unspecified trimester: Secondary | ICD-10-CM

## 2021-06-25 NOTE — Telephone Encounter (Signed)
Preadmission screen  

## 2021-06-25 NOTE — Patient Instructions (Signed)
°  Cervical Ripening (to get your cervix ready for labor) : May try one or all:  Red Raspberry Leaf capsules:  two 300mg or 400mg tablets with each meal, 2-3 times a day  Potential Side Effects Of Raspberry Leaf:  Most women do not experience any side effects from drinking raspberry leaf tea. However, nausea and loose stools are possible    Evening Primrose Oil capsules: may take 1 to 3 capsules daily. May also prick one to release the oil and insert it into your vagina at night.  Some of the potential side effects:  Upset stomach  Loose stools or diarrhea  Headaches  Nausea  6 Dates a day (may taste better if warmed in microwave until soft). Found where raisins are in the grocery store  

## 2021-06-25 NOTE — Progress Notes (Signed)
° °  LOW-RISK PREGNANCY VISIT Patient name: Patricia Richards MRN [id number]  Date of birth: Mar 09, 1991 Chief Complaint:   Routine Prenatal Visit (Decreased baby movement)  History of Present Illness:   Patricia Richards is a 31 y.o. [id number] female at [redacted]w[redacted]d with an Estimated Date of Delivery: 07/16/21 being seen today for ongoing management of a low-risk pregnancy.  Today she reports general pregnancy complaints.  Has had trouble feeling baby movement "the whole pregnancy", even when she can see it on her abdomen.  Contractions: Irritability. Vag. Bleeding: None.  Movement: (!) Decreased. denies leaking of fluid. Review of Systems:   Pertinent items are noted in HPI Denies abnormal vaginal discharge w/ itching/odor/irritation, headaches, visual changes, shortness of breath, chest pain, abdominal pain, severe nausea/vomiting, or problems with urination or bowel movements unless otherwise stated above. Pertinent History Reviewed:  Reviewed past medical,surgical, social, obstetrical and family history.  Reviewed problem list, medications and allergies. Physical Assessment:   Vitals:   06/25/21 1142  BP: 105/71  Pulse: (!) 104  Weight: 182 lb (82.6 kg)  Body mass index is 33.29 kg/m.        Physical Examination:   General appearance: Well appearing, and in no distress  Mental status: Alert, oriented to person, place, and time  Skin: Warm & dry  Cardiovascular: Normal heart rate noted  Respiratory: Normal respiratory effort, no distress  Abdomen: Soft, gravid, nontender  Pelvic: Cervical exam deferred         Extremities: Edema: Trace  Fetal Status:     Movement: (!) Decreased    NST: FHR baseline 130 bpm, Variability: moderate, Accelerations:present, Decelerations:  Absent= Cat 1/Reactive   Chaperone: Levy Pupa    No results found for this or any previous visit (from the past 24 hour(s)).  Assessment & Plan:  1) Low-risk pregnancy [id number] at [redacted]w[redacted]d with an Estimated Date of Delivery:  07/16/21   2) desires eIOL for social reasons (restraining order against FOB, but he still stalks her, expecting baby to be born in March). Scheduled for 39 week, 07/09/21. Orders in   Meds: No orders of the defined types were placed in this encounter.  Labs/procedures today: NST  Plan:  Continue routine obstetrical care  Next visit: prefers in person    Reviewed: Term labor symptoms and general obstetric precautions including but not limited to vaginal bleeding, contractions, leaking of fluid and fetal movement were reviewed in detail with the patient.  All questions were answered. Has home bp cuff.. Check bp weekly, let us know if >140/90.   Follow-up: Return in about 1 week (around 07/02/2021) for [place].  No orders of the defined types were placed in this encounter.  Christin Fudge DNP, CNM 06/25/2021 11:50 AM

## 2021-07-01 ENCOUNTER — Other Ambulatory Visit: Payer: Self-pay | Admitting: Advanced Practice Midwife

## 2021-07-02 ENCOUNTER — Other Ambulatory Visit: Payer: Self-pay

## 2021-07-02 ENCOUNTER — Encounter: Payer: Self-pay | Admitting: Women's Health

## 2021-07-02 ENCOUNTER — Other Ambulatory Visit (HOSPITAL_COMMUNITY)
Admission: RE | Admit: 2021-07-02 | Discharge: 2021-07-02 | Disposition: A | Discharge status: Home / Self Care | Payer: Medicaid Other | Source: Ambulatory Visit | Attending: Women's Health | Admitting: Women's Health

## 2021-07-02 ENCOUNTER — Ambulatory Visit (INDEPENDENT_AMBULATORY_CARE_PROVIDER_SITE_OTHER): Payer: Medicaid Other | Admitting: Women's Health

## 2021-07-02 VITALS — BP 100/68 | HR 92 | Wt 179.0 lb

## 2021-07-02 DIAGNOSIS — Z3480 Encounter for supervision of other normal pregnancy, unspecified trimester: Secondary | ICD-10-CM

## 2021-07-02 DIAGNOSIS — Z8619 Personal history of other infectious and parasitic diseases: Secondary | ICD-10-CM | POA: Diagnosis present

## 2021-07-02 DIAGNOSIS — Z09 Encounter for follow-up examination after completed treatment for conditions other than malignant neoplasm: Secondary | ICD-10-CM | POA: Insufficient documentation

## 2021-07-02 DIAGNOSIS — Z113 Encounter for screening for infections with a predominantly sexual mode of transmission: Secondary | ICD-10-CM

## 2021-07-02 DIAGNOSIS — Z3483 Encounter for supervision of other normal pregnancy, third trimester: Secondary | ICD-10-CM

## 2021-07-02 NOTE — Progress Notes (Signed)
LOW-RISK PREGNANCY VISIT Patient name: Patricia Richards MRN [id number]  Date of birth: August 26, 1990 Chief Complaint:   Routine Prenatal Visit  History of Present Illness:   Patricia Richards is a 31 y.o. [id number] female at [redacted]w[redacted]d with an Estimated Date of Delivery: 07/16/21 being seen today for ongoing management of a low-risk pregnancy.   Today she reports  congestion, cough . Contractions: Irregular. Vag. Bleeding: None.  Movement: Present. denies leaking of fluid.  Depression screen [facility] 2/9 04/07/2021 02/13/2019 07/26/2018 03/02/2018  Decreased Interest 1 0 2 1  Down, Depressed, Hopeless 1 0 2 1  PHQ - 2 Score 2 0 4 2  Altered sleeping 1 - - 1  Tired, decreased energy 3 - - 2  Change in appetite 2 - - 0  Feeling bad or failure about yourself  2 - - 1  Trouble concentrating 2 - - 1  Moving slowly or fidgety/restless 3 - - 0  Suicidal thoughts 0 - - 0  PHQ-9 Score 15 - - 7  Some recent data might be hidden     GAD 7 : Generalized Anxiety Score 04/07/2021  Nervous, Anxious, on Edge 3  Control/stop worrying 3  Worry too much - different things 3  Trouble relaxing 2  Restless 2  Easily annoyed or irritable 3  Afraid - awful might happen 3  Total GAD 7 Score 19      Review of Systems:   Pertinent items are noted in HPI Denies abnormal vaginal discharge w/ itching/odor/irritation, headaches, visual changes, shortness of breath, chest pain, abdominal pain, severe nausea/vomiting, or problems with urination or bowel movements unless otherwise stated above. Pertinent History Reviewed:  Reviewed past medical,surgical, social, obstetrical and family history.  Reviewed problem list, medications and allergies. Physical Assessment:   Vitals:   07/02/21 1115  BP: 100/68  Pulse: 92  Weight: 179 lb (81.2 kg)  Body mass index is 32.74 kg/m.        Physical Examination:   General appearance: Well appearing, and in no distress  Mental status: Alert, oriented to person, place, and  time  Skin: Warm & dry  Throat: swab obtained for gc/ct poc  Cardiovascular: Normal heart rate noted  Respiratory: Normal respiratory effort, no distress  Abdomen: Soft, gravid, nontender  Pelvic: Cervical exam performed  Dilation: 1 Effacement (%): Thick Station: Ballotable  Extremities: Edema: Trace  Fetal Status: Fetal Heart Rate (bpm): 143 Fundal Height: 36 cm Movement: Present Presentation: Vertex  Chaperone: Latisha Cresenzo   No results found for this or any previous visit (from the past 24 hour(s)).  Assessment & Plan:  1) Low-risk pregnancy [id number] at [redacted]w[redacted]d with an Estimated Date of Delivery: 07/16/21   2) Recent + GC/CT throat & genital, POC today  3) Cough/congestion> reports can't take otc meds, doing fine w/ cough drops   Meds: No orders of the defined types were placed in this encounter.  Labs/procedures today: GC/CT and SVE  Plan:  elective IOL 2/23 as scheduled Next visit: n/a  Reviewed: Term labor symptoms and general obstetric precautions including but not limited to vaginal bleeding, contractions, leaking of fluid and fetal movement were reviewed in detail with the patient.  All questions were answered. Does have home bp cuff. Office bp cuff given: not applicable. Check bp weekly, let us know if consistently >140 and/or >90.  Follow-up: Return for will schedule pp visit after delivery.  Future Appointments  Date Time Provider [place]  07/09/2021  6:30 AM [facility]  [place] None    Orders Placed This Encounter  Procedures   GC/Chlamydia Probe Amp   Roma Schanz CNM, [facility] 07/02/2021 11:37 AM

## 2021-07-02 NOTE — Patient Instructions (Signed)
[place], thank you for choosing our office today! We appreciate the opportunity to meet your healthcare needs. You may receive a short survey by mail, e-mail, or through [employer]. If you are happy with your care we would appreciate if you could take just a few minutes to complete the survey questions. We read all of your comments and take your feedback very seriously. Thank you again for choosing our office.  Center for [employer] Team at [place] at [facility] ([place], [place] [address]) Entrance C, located off of [place] parking   CLASSES: Go to [email or web address] to register for classes (childbirth, breastfeeding, waterbirth, infant CPR, daddy bootcamp, etc.)  Call the office [phone]) or go to [facility] if: You begin to have strong, frequent contractions Your water breaks.  Sometimes it is a big gush of fluid, sometimes it is just a trickle that keeps getting your panties wet or running down your legs You have vaginal bleeding.  It is normal to have a small amount of spotting if your cervix was checked.  You don't feel your baby moving like normal.  If you don't, get you something to eat and drink and lay down and focus on feeling your baby move.   If your baby is still not moving like normal, you should call the office or go to [facility].  Call the office [phone]) or go to [facility] hospital for these signs of pre-eclampsia: Severe headache that does not go away with Tylenol Visual changes- seeing spots, double, blurred vision Pain under your right breast or upper abdomen that does not go away with Tums or heartburn medicine Nausea and/or vomiting Severe swelling in your hands, feet, and face   [facility] Pediatricians/Family Doctors [place] [facility]): [address] [place], [place]: [address] [place], [facility]): [place], [phone] (call to ask if accepting patients) [facility] Department: [address], [place], [place] [facility]): [address], [address], [place] [facility]: [address] [place], [place] [facility]: [place], [place] [facility]): [phone] [facility]: [address], [place]: [address], [place] [facility]: [phone], [phone]  Home Blood Pressure Monitoring for Patients   Your provider has recommended that you check your blood pressure (BP) at least once a week at home. If you do not have a blood pressure cuff at home, one will be provided for you. Contact your provider if you have not received your monitor within 1 week.   Helpful Tips for Accurate Home Blood Pressure Checks  Don't smoke, exercise, or drink caffeine 30 minutes before checking your BP Use the restroom before checking your BP (a full bladder can raise your pressure) Relax in a comfortable upright chair Feet on the ground Left arm resting comfortably on a flat surface at the level of your heart Legs uncrossed Back supported Sit quietly and don't talk Place the cuff on your bare arm Adjust snuggly, so that only two fingertips  can fit between your skin and the top of the cuff Check 2 readings separated by at least one minute Keep a log of your BP readings For a visual, please reference this diagram: http://ccnc.care/bpdiagram  Provider Name: [facility]     Phone: [phone]  Zone 1: ALL CLEAR  Continue to monitor your symptoms:  BP reading is less than 140 (top number) or less than 90 (bottom number)  No right  upper stomach pain No headaches or seeing spots No feeling nauseated or throwing up No swelling in face and hands  Zone 2: CAUTION Call your doctor's office for any of the following:  BP reading is greater than 140 (top number) or greater than 90 (bottom number)  Stomach pain under your ribs in the middle or right side Headaches or seeing spots Feeling nauseated or throwing up Swelling in face and hands  Zone 3: EMERGENCY  Seek immediate medical care if you have any of the following:  BP reading is greater than160 (top number) or greater than 110 (bottom number) Severe headaches not improving with Tylenol Serious difficulty catching your breath Any worsening symptoms from Zone 2   Braxton Hicks Contractions Contractions of the uterus can occur throughout pregnancy, but they are not always a sign that you are in labor. You may have practice contractions called Braxton Hicks contractions. These false labor contractions are sometimes confused with true labor. What are [place] contractions? Braxton Hicks contractions are tightening movements that occur in the muscles of the uterus before labor. Unlike true labor contractions, these contractions do not result in opening (dilation) and thinning of the cervix. Toward the end of pregnancy (32-34 weeks), Braxton Hicks contractions can happen more often and may become stronger. These contractions are sometimes difficult to tell apart from true labor because they can be very uncomfortable. You should not feel embarrassed if you go to the hospital with false labor. Sometimes, the only way to tell if you are in true labor is for your health care provider to look for changes in the cervix. The health care provider will do a physical exam and may monitor your contractions. If you are not in true labor, the exam should show that your cervix is not dilating and your water has not broken. If there are no other health problems associated with your  pregnancy, it is completely safe for you to be sent home with false labor. You may continue to have Braxton Hicks contractions until you go into true labor. How to tell the difference between true labor and false labor True labor Contractions last 30-70 seconds. Contractions become very regular. Discomfort is usually felt in the top of the uterus, and it spreads to the lower abdomen and low back. Contractions do not go away with walking. Contractions usually become more intense and increase in frequency. The cervix dilates and gets thinner. False labor Contractions are usually shorter and not as strong as true labor contractions. Contractions are usually irregular. Contractions are often felt in the front of the lower abdomen and in the groin. Contractions may go away when you walk around or change positions while lying down. Contractions get weaker and are shorter-lasting as time goes on. The cervix usually does not dilate or become thin. Follow these instructions at home:  Take over-the-counter and prescription medicines only as told by your health care provider. Keep up with your usual exercises and follow other instructions from your health care provider. Eat and drink lightly if you think  you are going into labor. If Braxton Hicks contractions are making you uncomfortable: Change your position from lying down or resting to walking, or change from walking to resting. Sit and rest in a tub of warm water. Drink enough fluid to keep your urine pale yellow. Dehydration may cause these contractions. Do slow and deep breathing several times an hour. Keep all follow-up prenatal visits as told by your health care provider. This is important. Contact a health care provider if: You have a fever. You have continuous pain in your abdomen. Get help right away if: Your contractions become stronger, more regular, and closer together. You have fluid leaking or gushing from your vagina. You pass  blood-tinged mucus (bloody show). You have bleeding from your vagina. You have low back pain that you never had before. You feel your babys head pushing down and causing pelvic pressure. Your baby is not moving inside you as much as it used to. Summary Contractions that occur before labor are called Braxton Hicks contractions, false labor, or practice contractions. Braxton Hicks contractions are usually shorter, weaker, farther apart, and less regular than true labor contractions. True labor contractions usually become progressively stronger and regular, and they become more frequent. Manage discomfort from [facility] contractions by changing position, resting in a warm bath, drinking plenty of water, or practicing deep breathing. This information is not intended to replace advice given to you by your health care provider. Make sure you discuss any questions you have with your health care provider. Document Revised: 04/15/2017 Document Reviewed: 09/16/2016 Elsevier Patient Education  [place].

## 2021-07-03 LAB — CYTOLOGY, (ORAL, ANAL, URETHRAL) ANCILLARY ONLY
Chlamydia: NEGATIVE
Comment: NEGATIVE
Comment: NORMAL
Neisseria Gonorrhea: NEGATIVE

## 2021-07-04 LAB — GC/CHLAMYDIA PROBE AMP
Chlamydia trachomatis, NAA: NEGATIVE
Neisseria Gonorrhoeae by PCR: NEGATIVE

## 2021-07-07 ENCOUNTER — Encounter: Payer: Self-pay | Admitting: Advanced Practice Midwife

## 2021-07-09 ENCOUNTER — Encounter: Payer: Medicaid Other | Admitting: Advanced Practice Midwife

## 2021-07-09 ENCOUNTER — Inpatient Hospital Stay (HOSPITAL_COMMUNITY)
Admission: AD | Admit: 2021-07-09 | Discharge: 2021-07-11 | DRG: 807 | Disposition: A | Discharge status: Home / Self Care | Payer: Medicaid Other | Attending: Obstetrics and Gynecology | Admitting: Obstetrics and Gynecology

## 2021-07-09 ENCOUNTER — Inpatient Hospital Stay (HOSPITAL_COMMUNITY): Payer: Medicaid Other

## 2021-07-09 ENCOUNTER — Encounter (HOSPITAL_COMMUNITY): Payer: Self-pay | Admitting: Family Medicine

## 2021-07-09 DIAGNOSIS — O9832 Other infections with a predominantly sexual mode of transmission complicating childbirth: Secondary | ICD-10-CM | POA: Diagnosis present

## 2021-07-09 DIAGNOSIS — Z3480 Encounter for supervision of other normal pregnancy, unspecified trimester: Secondary | ICD-10-CM

## 2021-07-09 DIAGNOSIS — Z20822 Contact with and (suspected) exposure to covid-19: Secondary | ICD-10-CM | POA: Diagnosis present

## 2021-07-09 DIAGNOSIS — Z6791 Unspecified blood type, Rh negative: Secondary | ICD-10-CM

## 2021-07-09 DIAGNOSIS — O99344 Other mental disorders complicating childbirth: Secondary | ICD-10-CM | POA: Diagnosis present

## 2021-07-09 DIAGNOSIS — O26893 Other specified pregnancy related conditions, third trimester: Secondary | ICD-10-CM | POA: Diagnosis present

## 2021-07-09 DIAGNOSIS — Z3A39 39 weeks gestation of pregnancy: Secondary | ICD-10-CM | POA: Diagnosis not present

## 2021-07-09 DIAGNOSIS — A6000 Herpesviral infection of urogenital system, unspecified: Secondary | ICD-10-CM | POA: Diagnosis present

## 2021-07-09 DIAGNOSIS — R079 Chest pain, unspecified: Secondary | ICD-10-CM | POA: Diagnosis not present

## 2021-07-09 DIAGNOSIS — Z87891 Personal history of nicotine dependence: Secondary | ICD-10-CM

## 2021-07-09 DIAGNOSIS — F419 Anxiety disorder, unspecified: Secondary | ICD-10-CM | POA: Diagnosis present

## 2021-07-09 DIAGNOSIS — O99824 Streptococcus B carrier state complicating childbirth: Secondary | ICD-10-CM | POA: Diagnosis present

## 2021-07-09 DIAGNOSIS — F32A Depression, unspecified: Secondary | ICD-10-CM | POA: Diagnosis present

## 2021-07-09 DIAGNOSIS — O99893 Other specified diseases and conditions complicating puerperium: Secondary | ICD-10-CM | POA: Diagnosis not present

## 2021-07-09 DIAGNOSIS — O9852 Other viral diseases complicating childbirth: Secondary | ICD-10-CM | POA: Diagnosis not present

## 2021-07-09 DIAGNOSIS — O26899 Other specified pregnancy related conditions, unspecified trimester: Secondary | ICD-10-CM

## 2021-07-09 LAB — TYPE AND SCREEN
ABO/RH(D): A NEG
Antibody Screen: POSITIVE

## 2021-07-09 LAB — CBC
HCT: 32.5 % — ABNORMAL LOW (ref 36.0–46.0)
Hemoglobin: 10.9 g/dL — ABNORMAL LOW (ref 12.0–15.0)
MCH: 29.5 pg (ref 26.0–34.0)
MCHC: 33.5 g/dL (ref 30.0–36.0)
MCV: 87.8 fL (ref 80.0–100.0)
Platelets: 243 10*3/uL (ref 150–400)
RBC: 3.7 MIL/uL — ABNORMAL LOW (ref 3.87–5.11)
RDW: 15.1 % (ref 11.5–15.5)
WBC: 10.6 10*3/uL — ABNORMAL HIGH (ref 4.0–10.5)
nRBC: 0 % (ref 0.0–0.2)

## 2021-07-09 LAB — RAPID URINE DRUG SCREEN, HOSP PERFORMED
Amphetamines: NOT DETECTED
Barbiturates: NOT DETECTED
Benzodiazepines: NOT DETECTED
Cocaine: NOT DETECTED
Opiates: NOT DETECTED
Tetrahydrocannabinol: NOT DETECTED

## 2021-07-09 LAB — RESP PANEL BY RT-PCR (FLU A&B, COVID) ARPGX2
Influenza A by PCR: NEGATIVE
Influenza B by PCR: NEGATIVE
SARS Coronavirus 2 by RT PCR: NEGATIVE

## 2021-07-09 LAB — RPR: RPR Ser Ql: NONREACTIVE

## 2021-07-09 MED ORDER — PENICILLIN G POTASSIUM 5000000 UNITS IJ SOLR
5.0000 10*6.[IU] | Freq: Once | INTRAMUSCULAR | Status: AC
  Administered 2021-07-09: 5 10*6.[IU] via INTRAVENOUS
  Filled 2021-07-09: qty 5

## 2021-07-09 MED ORDER — EPHEDRINE 5 MG/ML INJ: 10.0000 mg | INTRAVENOUS | Status: DC | PRN

## 2021-07-09 MED ORDER — CLONAZEPAM 1 MG PO TABS
1.0000 mg | ORAL_TABLET | Freq: Three times a day (TID) | ORAL | Status: DC | PRN
Start: 2021-07-09 — End: 2021-07-10
  Administered 2021-07-09 (×2): 1 mg via ORAL
  Filled 2021-07-09 (×2): qty 1

## 2021-07-09 MED ORDER — SOD CITRATE-CITRIC ACID 500-334 MG/5ML PO SOLN: 30.0000 mL | ORAL | Status: DC | PRN

## 2021-07-09 MED ORDER — HYDROXYZINE HCL 50 MG PO TABS
50.0000 mg | ORAL_TABLET | Freq: Four times a day (QID) | ORAL | Status: DC | PRN
Start: 2021-07-09 — End: 2021-07-10

## 2021-07-09 MED ORDER — TERBUTALINE SULFATE 1 MG/ML IJ SOLN: 0.2500 mg | Freq: Once | INTRAMUSCULAR | Status: DC | PRN

## 2021-07-09 MED ORDER — PENICILLIN G POT IN DEXTROSE 60000 UNIT/ML IV SOLN
3.0000 10*6.[IU] | INTRAVENOUS | Status: DC
  Administered 2021-07-09 (×3): 3 10*6.[IU] via INTRAVENOUS
  Filled 2021-07-09 (×3): qty 50

## 2021-07-09 MED ORDER — PHENYLEPHRINE 40 MCG/ML (10ML) SYRINGE FOR IV PUSH (FOR BLOOD PRESSURE SUPPORT): 80.0000 ug | PREFILLED_SYRINGE | INTRAVENOUS | Status: DC | PRN

## 2021-07-09 MED ORDER — OXYTOCIN-SODIUM CHLORIDE 30-0.9 UT/500ML-% IV SOLN
2.5000 [IU]/h | INTRAVENOUS | Status: DC
Start: 2021-07-09 — End: 2021-07-10

## 2021-07-09 MED ORDER — FENTANYL CITRATE (PF) 100 MCG/2ML IJ SOLN: 50.0000 ug | INTRAMUSCULAR | Status: DC | PRN

## 2021-07-09 MED ORDER — LIDOCAINE HCL (PF) 1 % IJ SOLN: 30.0000 mL | INTRAMUSCULAR | Status: DC | PRN

## 2021-07-09 MED ORDER — MISOPROSTOL 50MCG HALF TABLET
50.0000 ug | ORAL_TABLET | ORAL | Status: DC | PRN
  Administered 2021-07-09: 50 ug via BUCCAL
  Filled 2021-07-09: qty 1

## 2021-07-09 MED ORDER — ACETAMINOPHEN 325 MG PO TABS
650.0000 mg | ORAL_TABLET | ORAL | Status: DC | PRN
Start: 2021-07-09 — End: 2021-07-10

## 2021-07-09 MED ORDER — OXYTOCIN BOLUS FROM INFUSION
333.0000 mL | Freq: Once | INTRAVENOUS | Status: AC
  Administered 2021-07-09: 333 mL via INTRAVENOUS

## 2021-07-09 MED ORDER — LACTATED RINGERS IV SOLN: 500.0000 mL | Freq: Once | INTRAVENOUS | Status: DC

## 2021-07-09 MED ORDER — FLEET ENEMA 7-19 GM/118ML RE ENEM
1.0000 | ENEMA | RECTAL | Status: DC | PRN
Start: 2021-07-09 — End: 2021-07-10

## 2021-07-09 MED ORDER — LACTATED RINGERS IV SOLN: 500.0000 mL | INTRAVENOUS | Status: DC | PRN

## 2021-07-09 MED ORDER — ONDANSETRON HCL 4 MG/2ML IJ SOLN: 4.0000 mg | Freq: Four times a day (QID) | INTRAMUSCULAR | Status: DC | PRN

## 2021-07-09 MED ORDER — LACTATED RINGERS IV SOLN: INTRAVENOUS | Status: DC

## 2021-07-09 MED ORDER — OXYTOCIN-SODIUM CHLORIDE 30-0.9 UT/500ML-% IV SOLN
1.0000 m[IU]/min | INTRAVENOUS | Status: DC
  Administered 2021-07-09: 2 m[IU]/min via INTRAVENOUS
  Filled 2021-07-09: qty 500

## 2021-07-09 MED ORDER — TRANEXAMIC ACID-NACL 1000-0.7 MG/100ML-% IV SOLN
1000.0000 mg | INTRAVENOUS | Status: AC
  Administered 2021-07-09: 1000 mg via INTRAVENOUS

## 2021-07-09 MED ORDER — TRANEXAMIC ACID-NACL 1000-0.7 MG/100ML-% IV SOLN
INTRAVENOUS | Status: AC
  Filled 2021-07-09: qty 100

## 2021-07-09 MED ORDER — DIPHENHYDRAMINE HCL 50 MG/ML IJ SOLN: 12.5000 mg | INTRAMUSCULAR | Status: DC | PRN

## 2021-07-09 MED ORDER — FENTANYL-BUPIVACAINE-NACL 0.5-0.125-0.9 MG/250ML-% EP SOLN: 12.0000 mL/h | EPIDURAL | Status: DC | PRN

## 2021-07-09 MED ORDER — FENTANYL-BUPIVACAINE-NACL 0.5-0.125-0.9 MG/250ML-% EP SOLN
EPIDURAL | Status: AC
  Filled 2021-07-09: qty 250

## 2021-07-09 NOTE — Progress Notes (Signed)
Labor Progress Note Patricia Richards is a 31 y.o. [id number] at [redacted]w[redacted]d presented for eIOL.   S: called by RN that FB came out and patient is feeling pressure.   O:  BP 112/68    Pulse 100    Temp (!) 97.5 F (36.4 C) (Oral)    Resp 19    Ht 5\' 3"  (1.6 m)    Wt 82.4 kg    LMP 10/09/2020 (Exact Date)    BMI 32.17 kg/m  EFM: 135/mod/15x15  CVE: Dilation: 4.5 Effacement (%): 60 Station: -3, Ballotable Presentation: Vertex (By ultrasound) Exam by:: Bolivar Haw   A&P: 31 y.o. [id number] [redacted]w[redacted]d  #Labor: Progressing well, barely able to feel head with check but confirmed vertex with [place]. Will monitor, not due for additional medication if needed for another 2 hours.  #Pain: PRN  #FWB: Cat I  #GBS positive PCN  [name], DO 4:52 PM

## 2021-07-09 NOTE — Progress Notes (Signed)
Patient Vitals for the past 4 hrs:  BP Temp Temp src Pulse Resp SpO2  07/09/21 2215 -- -- -- -- -- 99 %  07/09/21 2205 (!) 100/54 -- -- (!) 115 18 --  07/09/21 2031 111/72 -- -- (!) 102 18 --  07/09/21 1930 (!) 97/53 -- -- (!) 102 18 --  07/09/21 1920 -- (!) 97.4 F (36.3 C) Oral -- 18 --  07/09/21 1902 96/61 -- -- 95 -- --  07/09/21 1841 112/71 -- -- (!) 106 -- --   Feeling pressure.  Cx 8-9/90/0.  FHR Cat 1, some early decels.  Wants an  epidural.  Will try to get her one.

## 2021-07-09 NOTE — Progress Notes (Signed)
Labor Progress Note Patricia Richards is a 31 y.o. [id number] at [redacted]w[redacted]d presented for eIOL.  S: Feeling anxious about delivery. Reports more discomfort with contractions.  O:  BP (!) 97/53    Pulse (!) 102    Temp (!) 97.4 F (36.3 C) (Oral)    Resp 18    Ht 5\' 3"  (1.6 m)    Wt 82.4 kg    LMP 10/09/2020 (Exact Date)    BMI 32.17 kg/m  EFM: Baseline FHR 135 bpm/moderate variability/+accels, no decels Toco: Regular, every 2-3 minutes  CVE: Dilation: 7.5 Effacement (%): 100 Station: -2 Presentation: Vertex Exam by:: Manus Gunning crecenzo CMW   A&P: 31 y.o. [id number] [redacted]w[redacted]d  #Labor: Progressing well. S/p AROM with pink-tinged fluid. Will continue with pitocin.  Anticipate SVD. Will reassess in 1-2 hours, sooner if any changes.  #Pain: PRN  #FWB: Cat 1 #GBS positive; PCN  #Anxiety: Continue home Klonopin 1 mg TID, prn Vistaril.   Karsten Fells, DO PGY-1 07/09/2021, 8:57 PM
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
## 2021-07-09 NOTE — Progress Notes (Signed)
Labor Progress Note Patricia Richards is a 31 y.o. [id number] at [redacted]w[redacted]d presented for eIOL.   S: Feeling contractions. Comfortable.   O:  BP 112/68    Pulse 100    Temp (!) 97.5 F (36.4 C) (Oral)    Resp 19    Ht 5\' 3"  (1.6 m)    Wt 82.4 kg    LMP 10/09/2020 (Exact Date)    BMI 32.17 kg/m  EFM: 135/mod/15x15/none  CVE: Dilation: 4.5 Effacement (%): 60 Station: -3, Ballotable Presentation: Vertex (By ultrasound) Exam by:: Bolivar Haw   A&P: 31 y.o. L4T6256 [redacted]w[redacted]d  #Labor: S/p FB and one cytotec, now contracting spontaneously about every 2-4 minutes, however overall comfortable. Fetal head ballotable and not applied into the pelvis. Will start pit 2x2 and plan to recheck in short interval for AROM.  #Pain: PRN  #FWB: Cat I  #GBS positive PCN   [name], DO 4:48 PM

## 2021-07-09 NOTE — Discharge Summary (Signed)
Postpartum Discharge Summary     Patient Name: Patricia Richards DOB: February 25, 1991 MRN: [id number]  Date of admission: 07/09/2021 Delivery date:07/09/2021  Delivering provider: Christin Fudge  Date of discharge: 07/11/2021  Admitting diagnosis: Indication for care in labor or delivery [O75.9] Intrauterine pregnancy: [redacted]w[redacted]d    Secondary diagnosis:  Active Problems:   Indication for care in labor or delivery  Additional problems: Inpatient social work evaluation    Discharge diagnosis: Term Pregnancy Delivered                                              Post partum procedures: N/A Augmentation: AROM, Pitocin, Cytotec, and IP Foley Complications: None  Hospital course: Induction of Labor With Vaginal Delivery   31y.o. yo [id number] admitted to the hospital 07/09/2021 for induction of labor.  Indication for induction: Elective.  Patient had an uncomplicated labor course as follows: Membrane Rupture Time/Date: 8:43 PM ,07/09/2021   Delivery Method:Vaginal, Spontaneous  Episiotomy: None  Lacerations:  None  Details of delivery can be found in separate delivery note.  Patient had a routine postpartum course. Patient is discharged home 07/11/21.  Newborn Data: Birth date:07/09/2021  Birth time:11:03 PM  Gender:Female  Living status:Living  Apgars:9 ,9  Weight:3530 g   Magnesium Sulfate received: No BMZ received: No Rhophylac:Yes MMR:N/A T-DaP:Given prenatally Flu: No Transfusion:No  Physical exam  Vitals:   07/09/21 2215 07/09/21 2330 07/10/21 0000 07/10/21 0001  BP:  108/69 110/66   Pulse:  (!) 101 99   Resp:    18  Temp:      TempSrc:      SpO2: 99%     Weight:      Height:       General: alert, cooperative, and no distress Lochia: appropriate Uterine Fundus: firm Incision: N/A DVT Evaluation: No evidence of DVT seen on physical exam. Labs: Lab Results  Component Value Date   WBC 10.6 (H) 07/09/2021   HGB 10.9 (L) 07/09/2021   HCT 32.5 (L)  07/09/2021   MCV 87.8 07/09/2021   PLT 243 07/09/2021   CMP Latest Ref Rng & Units 05/23/2021  Glucose 70 - 99 mg/dL 92  BUN 6 - 20 mg/dL 7  Creatinine 0.44 - 1.00 mg/dL 0.49  Sodium 135 - 145 mmol/L 136  Potassium 3.5 - 5.1 mmol/L 3.8  Chloride 98 - 111 mmol/L 107  CO2 22 - 32 mmol/L 22  Calcium 8.9 - 10.3 mg/dL 8.4(L)  Total Protein 6.5 - 8.1 g/dL -  Total Bilirubin 0.3 - 1.2 mg/dL -  Alkaline Phos 38 - 126 U/L -  AST 15 - 41 U/L -  ALT 0 - 44 U/L -   Edinburgh Score: Edinburgh Postnatal Depression Scale Screening Tool 10/02/2019  I have been able to laugh and see the funny side of things. 1  I have looked forward with enjoyment to things. 1  I have blamed myself unnecessarily when things went wrong. 0  I have been anxious or worried for no good reason. 3  I have felt scared or panicky for no good reason. 3  Things have been getting on top of me. 1  I have been so unhappy that I have had difficulty sleeping. 0  I have felt sad or miserable. 0  I have been so unhappy that I have been crying.  3  The thought of harming myself has occurred to me. 0  Edinburgh Postnatal Depression Scale Total 12     After visit meds:  Allergies as of 07/11/2021       Reactions   Metronidazole Shortness Of Breath   Pt states was her anxiety, not really sob/anaphylaxis   Tramadol Other (See Comments)   "CAUSES REALLY BAD MOOD SWINGS AND HALLUCINATIONS"   Reglan [metoclopramide Hcl] Other (See Comments)   hallucinations   Toradol [ketorolac Tromethamine] Other (See Comments)   Hallucinations        Medication List     STOP taking these medications    HYDROcodone-acetaminophen 5-325 MG tablet Commonly known as: NORCO/VICODIN   meclizine 50 MG tablet Commonly known as: Antivert       TAKE these medications    acetaminophen 325 MG tablet Commonly known as: Tylenol Take 2 tablets (650 mg total) by mouth every 4 (four) hours as needed for up to 180 doses (for pain scale < 4).    acyclovir 400 MG tablet Commonly known as: ZOVIRAX Take 1 tablet (400 mg total) by mouth 3 (three) times daily.   Blood Pressure Monitor Misc For regular home bp monitoring during pregnancy   calcium carbonate 500 MG chewable tablet Commonly known as: TUMS - dosed in mg elemental calcium Chew 2 tablets (400 mg of elemental calcium total) by mouth 3 (three) times daily as needed for up to 180 doses for indigestion or heartburn.   clonazePAM 1 MG tablet Commonly known as: KLONOPIN TAKE (1) TABLET BY MOUTH THREE TIMES DAILY   famotidine 20 MG tablet Commonly known as: Pepcid Take 1 tablet (20 mg total) by mouth 2 (two) times daily.   ibuprofen 600 MG tablet Commonly known as: ADVIL Take 1 tablet (600 mg total) by mouth every 6 (six) hours as needed for cramping.   IRON PO Take by mouth.   omeprazole 20 MG tablet Commonly known as: PriLOSEC OTC Take 1 tablet (20 mg total) by mouth daily.   PRENATAL VITAMIN PO Take by mouth.   promethazine 25 MG tablet Commonly known as: PHENERGAN Take 0.5-1 tablets (12.5-25 mg total) by mouth every 6 (six) hours as needed for nausea or vomiting.         Discharge home in stable condition Infant Feeding: Bottle Infant Disposition:home with mother Discharge instruction: per After Visit Summary and Postpartum booklet. Activity: Advance as tolerated. Pelvic rest for 6 weeks.  Diet: routine diet Future Appointments: Future Appointments  Date Time Provider [place]  05/20/2022 10:30 AM Cresenzo-Dishmon, Joaquim Lai, CNM [facility]   Follow up Visit:   Please schedule this patient for a In person postpartum visit in 4 weeks with the following provider: Any provider. Additional Postpartum F/U:  Low risk pregnancy complicated by:  Delivery mode:  Vaginal, Spontaneous  Anticipated Birth Control:   saplingectomy/ablation   Mallie Snooks, [place], MSN, CNM Certified Nurse Midwife, Product/process development scientist for [employer], [place]

## 2021-07-10 ENCOUNTER — Encounter (HOSPITAL_COMMUNITY): Payer: Self-pay | Admitting: Family Medicine

## 2021-07-10 MED ORDER — METHYLERGONOVINE MALEATE 0.2 MG/ML IJ SOLN: 0.2000 mg | INTRAMUSCULAR | Status: DC | PRN

## 2021-07-10 MED ORDER — ALUM & MAG HYDROXIDE-SIMETH 200-200-20 MG/5ML PO SUSP
30.0000 mL | Freq: Once | ORAL | Status: AC
  Administered 2021-07-11: 30 mL via ORAL
  Filled 2021-07-10: qty 30

## 2021-07-10 MED ORDER — COCONUT OIL OIL: 1.0000 "application " | TOPICAL_OIL | Status: DC | PRN

## 2021-07-10 MED ORDER — FAMOTIDINE 20 MG PO TABS
20.0000 mg | ORAL_TABLET | Freq: Every day | ORAL | Status: DC
Start: 2021-07-11 — End: 2021-07-11
  Administered 2021-07-11: 20 mg via ORAL
  Filled 2021-07-10: qty 1

## 2021-07-10 MED ORDER — ALUM & MAG HYDROXIDE-SIMETH 200-200-20 MG/5ML PO SUSP
30.0000 mL | Freq: Once | ORAL | Status: AC
  Administered 2021-07-10: 30 mL via ORAL
  Filled 2021-07-10: qty 30

## 2021-07-10 MED ORDER — FERROUS SULFATE 325 (65 FE) MG PO TABS
325.0000 mg | ORAL_TABLET | ORAL | Status: DC
Start: 2021-07-10 — End: 2021-07-11
  Administered 2021-07-10: 325 mg via ORAL
  Filled 2021-07-10: qty 1

## 2021-07-10 MED ORDER — ONDANSETRON HCL 4 MG PO TABS: 4.0000 mg | ORAL_TABLET | ORAL | Status: DC | PRN

## 2021-07-10 MED ORDER — DOCUSATE SODIUM 100 MG PO CAPS
100.0000 mg | ORAL_CAPSULE | Freq: Two times a day (BID) | ORAL | Status: DC
  Administered 2021-07-10 – 2021-07-11 (×2): 100 mg via ORAL
  Filled 2021-07-10 (×2): qty 1

## 2021-07-10 MED ORDER — WITCH HAZEL-GLYCERIN EX PADS: 1.0000 "application " | MEDICATED_PAD | CUTANEOUS | Status: DC | PRN

## 2021-07-10 MED ORDER — ACYCLOVIR 400 MG PO TABS
400.0000 mg | ORAL_TABLET | Freq: Three times a day (TID) | ORAL | Status: DC
  Administered 2021-07-10 – 2021-07-11 (×4): 400 mg via ORAL
  Filled 2021-07-10 (×7): qty 1

## 2021-07-10 MED ORDER — ACETAMINOPHEN 325 MG PO TABS
650.0000 mg | ORAL_TABLET | ORAL | Status: DC | PRN
  Administered 2021-07-10: 650 mg via ORAL
  Filled 2021-07-10: qty 2

## 2021-07-10 MED ORDER — DIPHENHYDRAMINE HCL 25 MG PO CAPS: 25.0000 mg | ORAL_CAPSULE | Freq: Four times a day (QID) | ORAL | Status: DC | PRN

## 2021-07-10 MED ORDER — CLONAZEPAM 1 MG PO TABS
1.0000 mg | ORAL_TABLET | Freq: Three times a day (TID) | ORAL | Status: DC | PRN
  Administered 2021-07-10 – 2021-07-11 (×2): 1 mg via ORAL
  Filled 2021-07-10 (×2): qty 1

## 2021-07-10 MED ORDER — BISACODYL 10 MG RE SUPP: 10.0000 mg | Freq: Every day | RECTAL | Status: DC | PRN

## 2021-07-10 MED ORDER — IBUPROFEN 600 MG PO TABS
600.0000 mg | ORAL_TABLET | Freq: Four times a day (QID) | ORAL | Status: DC | PRN
  Administered 2021-07-10 – 2021-07-11 (×4): 600 mg via ORAL
  Filled 2021-07-10 (×5): qty 1

## 2021-07-10 MED ORDER — PRENATAL MULTIVITAMIN CH
1.0000 | ORAL_TABLET | Freq: Every day | ORAL | Status: DC
Start: 2021-07-10 — End: 2021-07-11
  Administered 2021-07-10 – 2021-07-11 (×2): 1 via ORAL
  Filled 2021-07-10 (×2): qty 1

## 2021-07-10 MED ORDER — LIDOCAINE VISCOUS HCL 2 % MT SOLN
15.0000 mL | Freq: Once | OROMUCOSAL | Status: AC
  Administered 2021-07-10: 15 mL via ORAL
  Filled 2021-07-10: qty 15

## 2021-07-10 MED ORDER — ONDANSETRON HCL 4 MG/2ML IJ SOLN: 4.0000 mg | INTRAMUSCULAR | Status: DC | PRN

## 2021-07-10 MED ORDER — BENZOCAINE-MENTHOL 20-0.5 % EX AERO
1.0000 "application " | INHALATION_SPRAY | CUTANEOUS | Status: DC | PRN
  Administered 2021-07-10: 1 via TOPICAL
  Filled 2021-07-10: qty 56

## 2021-07-10 MED ORDER — FLEET ENEMA 7-19 GM/118ML RE ENEM: 1.0000 | ENEMA | Freq: Every day | RECTAL | Status: DC | PRN

## 2021-07-10 MED ORDER — METHYLERGONOVINE MALEATE 0.2 MG PO TABS: 0.2000 mg | ORAL_TABLET | ORAL | Status: DC | PRN

## 2021-07-10 MED ORDER — MEDROXYPROGESTERONE ACETATE 150 MG/ML IM SUSP: 150.0000 mg | INTRAMUSCULAR | Status: DC | PRN

## 2021-07-10 MED ORDER — MEASLES, MUMPS & RUBELLA VAC IJ SOLR: 0.5000 mL | Freq: Once | INTRAMUSCULAR | Status: DC

## 2021-07-10 MED ORDER — CALCIUM CARBONATE ANTACID 500 MG PO CHEW
400.0000 mg | CHEWABLE_TABLET | Freq: Three times a day (TID) | ORAL | Status: DC | PRN
  Administered 2021-07-11: 400 mg via ORAL
  Filled 2021-07-10: qty 2

## 2021-07-10 MED ORDER — SIMETHICONE 80 MG PO CHEW
80.0000 mg | CHEWABLE_TABLET | ORAL | Status: DC | PRN
Start: 2021-07-10 — End: 2021-07-11

## 2021-07-10 MED ORDER — DIBUCAINE (PERIANAL) 1 % EX OINT: 1.0000 "application " | TOPICAL_OINTMENT | CUTANEOUS | Status: DC | PRN

## 2021-07-10 MED ORDER — TETANUS-DIPHTH-ACELL PERTUSSIS 5-2.5-18.5 LF-MCG/0.5 IM SUSY
0.5000 mL | PREFILLED_SYRINGE | Freq: Once | INTRAMUSCULAR | Status: DC
Start: 2021-07-11 — End: 2021-07-11

## 2021-07-10 NOTE — Progress Notes (Signed)
Post Partum Day 1 Subjective:  up ad lib, voiding and tolerating PO, small lochia, plans to bottle feed,  plans outpt BTL. C/O pain in chest that radiates bilaterally to both flanks w/inspiration. Objective: Blood pressure (!) 106/59, pulse 81, temperature 97.7 F (36.5 C), temperature source Oral, resp. rate 18, height 5\' 3"  (1.6 m), weight 82.4 kg, last menstrual period 10/09/2020, SpO2 99 %, unknown if currently breastfeeding.  Physical Exam:  General: alert, cooperative and no distress Lochia:normal flow Chest: CTAB, resp effort normal. O2 sat 99% Heart: RRR  Abdomen: +BS, soft, nontender,  Uterine Fundus: firm DVT Evaluation: No evidence of DVT seen on physical exam. Neg Homan's Extremities: no edema  Recent Labs    07/09/21 0747  HGB 10.9*  HCT 32.5*    Assessment/Plan: Pain in chest w/inspriation:  low concern for PE.  ? GI vs MSK? Will start w/a GI cocktail.    LOS: 1 day   Christin Fudge 07/10/2021, 8:00 AM
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
## 2021-07-11 MED ORDER — IBUPROFEN 600 MG PO TABS: 600.0000 mg | ORAL_TABLET | Freq: Four times a day (QID) | ORAL | 0 refills | Status: DC | PRN

## 2021-07-11 MED ORDER — ACETAMINOPHEN 325 MG PO TABS: 650.0000 mg | ORAL_TABLET | ORAL | 0 refills | Status: AC | PRN

## 2021-07-11 MED ORDER — OMEPRAZOLE MAGNESIUM 20 MG PO TBEC: 20.0000 mg | DELAYED_RELEASE_TABLET | Freq: Every day | ORAL | 0 refills | Status: DC

## 2021-07-11 MED ORDER — CALCIUM CARBONATE ANTACID 500 MG PO CHEW: 400.0000 mg | CHEWABLE_TABLET | Freq: Three times a day (TID) | ORAL | 0 refills | Status: DC | PRN

## 2021-07-11 NOTE — Discharge Instructions (Signed)

## 2021-07-11 NOTE — Plan of Care (Signed)
Problem: Education: Goal: Knowledge of General Education information will improve Description: Including pain rating scale, medication(s)/side effects and non-pharmacologic comfort measures Outcome: Completed/Met   Problem: Clinical Measurements: Goal: Ability to maintain clinical measurements within normal limits will improve Outcome: Completed/Met Goal: Will remain free from infection Outcome: Completed/Met Goal: Diagnostic test results will improve Outcome: Completed/Met Goal: Respiratory complications will improve Outcome: Completed/Met Goal: Cardiovascular complication will be avoided Outcome: Completed/Met   Problem: Activity: Goal: Risk for activity intolerance will decrease Outcome: Completed/Met   Problem: Nutrition: Goal: Adequate nutrition will be maintained Outcome: Completed/Met   Problem: Coping: Goal: Level of anxiety will decrease Outcome: Completed/Met   Problem: Elimination: Goal: Will not experience complications related to bowel motility Outcome: Completed/Met Goal: Will not experience complications related to urinary retention Outcome: Completed/Met   Problem: Pain Managment: Goal: General experience of comfort will improve Outcome: Completed/Met   Problem: Safety: Goal: Ability to remain free from injury will improve Outcome: Completed/Met   Problem: Skin Integrity: Goal: Risk for impaired skin integrity will decrease Outcome: Completed/Met   Problem: Education: Goal: Knowledge of condition will improve Outcome: Completed/Met Goal: Individualized Educational Video(s) Outcome: Completed/Met Goal: Individualized Newborn Educational Video(s) Outcome: Completed/Met   Problem: Activity: Goal: Will verbalize the importance of balancing activity with adequate rest periods Outcome: Completed/Met Goal: Ability to tolerate increased activity will improve Outcome: Completed/Met   Problem: Life Cycle: Goal: Chance of risk for complications  during the postpartum period will decrease Outcome: Completed/Met   Problem: Role Relationship: Goal: Ability to demonstrate positive interaction with newborn will improve Outcome: Completed/Met   Problem: Skin Integrity: Goal: Demonstration of wound healing without infection will improve Outcome: Completed/Met

## 2021-07-11 NOTE — Progress Notes (Signed)
Follow up for patient's 4 week check was entered incorrectly by discharging provider and reads as "May 20, 2022." Called Dr. Jinny Sanders to request a new date. Sharolyn Douglas, CNM stated that the appointment could not be changed at this time due to this being a weekend; therefore, she sent the office a message that the date was entered incorrectly and the office will notify the patient of her new appointment date and time. Patient made aware and patient verbalizes understanding. Maxwell Caul, Leretha Dykes [place]

## 2021-07-13 DIAGNOSIS — F603 Borderline personality disorder: Secondary | ICD-10-CM | POA: Insufficient documentation

## 2021-07-14 ENCOUNTER — Other Ambulatory Visit: Payer: Self-pay | Admitting: Obstetrics & Gynecology

## 2021-07-14 DIAGNOSIS — F1991 Other psychoactive substance use, unspecified, in remission: Secondary | ICD-10-CM

## 2021-07-14 DIAGNOSIS — Z79899 Other long term (current) drug therapy: Secondary | ICD-10-CM

## 2021-07-16 ENCOUNTER — Encounter: Payer: Medicaid Other | Admitting: Obstetrics & Gynecology

## 2021-07-20 ENCOUNTER — Other Ambulatory Visit: Payer: Self-pay

## 2021-07-20 ENCOUNTER — Encounter: Payer: Self-pay | Admitting: Advanced Practice Midwife

## 2021-07-20 ENCOUNTER — Encounter: Payer: Self-pay | Admitting: Internal Medicine

## 2021-07-20 ENCOUNTER — Ambulatory Visit (INDEPENDENT_AMBULATORY_CARE_PROVIDER_SITE_OTHER): Payer: Medicaid Other

## 2021-07-20 VITALS — BP 154/95 | HR 51

## 2021-07-20 DIAGNOSIS — Z0130 Encounter for examination of blood pressure without abnormal findings: Secondary | ICD-10-CM

## 2021-07-20 MED ORDER — NIFEDIPINE ER OSMOTIC RELEASE 30 MG PO TB24: 30.0000 mg | ORAL_TABLET | Freq: Every day | ORAL | 1 refills | Status: DC

## 2021-07-20 NOTE — Addendum Note (Signed)
Addended by: Florian Buff on: 07/20/2021 02:11 PM ? ? Modules accepted: Orders ? ?

## 2021-07-20 NOTE — Progress Notes (Cosign Needed Addendum)
? ?  NURSE VISIT- BLOOD PRESSURE CHECK ? ?SUBJECTIVE:  ?Patricia Richards is a 31 y.o. [phone] female here for BP check. She is postpartum, delivery date 07/09/21    ? ?HYPERTENSION ROS: ? ?Postpartum:  ?Severe headaches that don't go away with tylenol/other medicines: No  ?Visual changes (seeing spots/double/blurred vision) Yes  ?Severe pain under right breast breast or in center of upper chest No  ?Severe nausea/vomiting No  ?Taking medicines as instructed not applicable ? ?OBJECTIVE:  ?BP (!) 154/95 (BP Location: Left Arm, Patient Position: Sitting, Cuff Size: Normal)   Pulse (!) 51   Breastfeeding No   ?Appearance alert, well appearing, and in no distress and oriented to person, place, and time. ? ?ASSESSMENT: ?Postpartum  blood pressure check ? ?PLAN: ?Discussed with Dr. Elonda Husky   ?Recommendations: new prescription will be sent   ?Follow-up:  1 week   ? ?Delise Simenson A Kaleiah Kutzer  ?07/20/2021 ?10:58 AM  ? ?Meds ordered this encounter  ?Medications  ? NIFEdipine (PROCARDIA XL) 30 MG 24 hr tablet  ?  Sig: Take 1 tablet (30 mg total) by mouth daily.  ?  Dispense:  30 tablet  ?  Refill:  1  ? ?Attestation of Attending Supervision of Nursing Visit Encounter: Evaluation and management procedures were performed by the nursing staff under my supervision and collaboration.  I have reviewed the nurse's note and chart, and I agree with the management and plan. ? ?Jacelyn Grip MD ?Attending Physician for the Center for [facility] ?07/20/2021 ?2:10 PM ? ?

## 2021-07-21 ENCOUNTER — Encounter: Payer: Self-pay | Admitting: Obstetrics & Gynecology

## 2021-07-21 ENCOUNTER — Telehealth (HOSPITAL_COMMUNITY): Payer: Self-pay

## 2021-07-21 DIAGNOSIS — Z1331 Encounter for screening for depression: Secondary | ICD-10-CM

## 2021-07-21 MED ORDER — LISINOPRIL 10 MG PO TABS: 10.0000 mg | ORAL_TABLET | Freq: Every day | ORAL | 2 refills | Status: DC

## 2021-07-21 NOTE — Telephone Encounter (Signed)
Referral placed for integrated behavioral health. EPDS on 07/10/2021 was 10.  ? ?Sharyn Lull Bach Rocchi,RN3,MSN,RNC-MNN ?07/21/2021,1623 ?

## 2021-07-22 NOTE — Telephone Encounter (Addendum)
I spoke with the pt to offer her an appt today with Dr. Harrington Challenger in [place] but she declined... she says she does not have time and she has to "pick up her BP meds".... I urged her to see if someone can bring her but she still declined.  ? ?She says that her BP has been elevated on and off and when her BP is up her HR goes down into the 40's... I asked her to keep a log for Dr. Harrington Challenger to review... she says she was seen in [place] recently and her "BNP was 128"... she says she has been very tired but answers were very vague, she is not SOB/ no dizziness.... she wants to be seen in [place] only... I was not able to make her a sooner appt than the one she has in May with B Strader... she will continue to monitor and let us know if anything changes.  ?

## 2021-07-23 NOTE — Telephone Encounter (Signed)
OK 

## 2021-07-27 ENCOUNTER — Ambulatory Visit (INDEPENDENT_AMBULATORY_CARE_PROVIDER_SITE_OTHER): Payer: Medicaid Other | Admitting: *Deleted

## 2021-07-27 ENCOUNTER — Other Ambulatory Visit: Payer: Self-pay

## 2021-07-27 VITALS — BP 127/89 | HR 62

## 2021-07-27 DIAGNOSIS — Z0130 Encounter for examination of blood pressure without abnormal findings: Secondary | ICD-10-CM

## 2021-07-27 NOTE — Progress Notes (Signed)
? ?  NURSE VISIT- BLOOD PRESSURE CHECK ? ?SUBJECTIVE:  ?Patricia Richards is a 31 y.o. [phone] female here for BP check. She is postpartum, delivery date 07/09/21    ? ?HYPERTENSION ROS: ? ?Postpartum:  ?Severe headaches that don't go away with tylenol/other medicines: No  ?Visual changes (seeing spots/double/blurred vision) No  ?Severe pain under right breast breast or in center of upper chest No  ?Severe nausea/vomiting No  ?Taking medicines as instructed yes ? ? ?OBJECTIVE:  ?BP 127/89 (BP Location: Right Arm, Patient Position: Sitting, Cuff Size: Normal)   Pulse 62   Breastfeeding No   ?Appearance alert, well appearing, and in no distress and oriented to person, place, and time. ? ?ASSESSMENT: ?Postpartum  blood pressure check ? ?PLAN: ?Discussed with Dr. Elonda Husky   ?Recommendations: no changes needed   ?Follow-up: as scheduled  ? ?Patricia Richards  ?07/27/2021 ?3:53 PM ? ?

## 2021-08-06 ENCOUNTER — Encounter: Payer: Self-pay | Admitting: Women's Health

## 2021-08-06 ENCOUNTER — Ambulatory Visit: Payer: Medicaid Other | Admitting: Advanced Practice Midwife

## 2021-08-20 ENCOUNTER — Ambulatory Visit (INDEPENDENT_AMBULATORY_CARE_PROVIDER_SITE_OTHER): Payer: Medicaid Other | Admitting: Advanced Practice Midwife

## 2021-08-20 ENCOUNTER — Encounter: Payer: Self-pay | Admitting: Advanced Practice Midwife

## 2021-08-20 DIAGNOSIS — F1991 Other psychoactive substance use, unspecified, in remission: Secondary | ICD-10-CM | POA: Diagnosis not present

## 2021-08-20 DIAGNOSIS — Z79899 Other long term (current) drug therapy: Secondary | ICD-10-CM | POA: Diagnosis not present

## 2021-08-20 MED ORDER — ETONOGESTREL-ETHINYL ESTRADIOL 0.12-0.015 MG/24HR VA RING: VAGINAL_RING | VAGINAL | 12 refills | Status: DC

## 2021-08-20 MED ORDER — CLONAZEPAM 1 MG PO TABS: ORAL_TABLET | ORAL | 0 refills | Status: DC
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
## 2021-09-10 DIAGNOSIS — F0630 Mood disorder due to known physiological condition, unspecified: Secondary | ICD-10-CM | POA: Insufficient documentation

## 2021-10-14 ENCOUNTER — Encounter: Payer: Self-pay | Admitting: Student

## 2021-10-14 ENCOUNTER — Ambulatory Visit (INDEPENDENT_AMBULATORY_CARE_PROVIDER_SITE_OTHER): Payer: Medicaid Other | Admitting: Student

## 2021-10-14 VITALS — BP 116/68 | HR 82 | Ht 63.0 in | Wt 161.0 lb

## 2021-10-14 DIAGNOSIS — F1011 Alcohol abuse, in remission: Secondary | ICD-10-CM

## 2021-10-14 DIAGNOSIS — R002 Palpitations: Secondary | ICD-10-CM | POA: Diagnosis not present

## 2021-10-14 NOTE — Patient Instructions (Signed)
Medication Instructions:  ?Your physician recommends that you continue on your current medications as directed. Please refer to the Current Medication list given to you today. ? ? ?Labwork: ?None today ? ?Testing/Procedures: ?Your physician has requested that you have an echocardiogram. Echocardiography is a painless test that uses sound waves to create images of your heart. It provides your doctor with information about the size and shape of your heart and how well your heart?s chambers and valves are working. This procedure takes approximately one hour. There are no restrictions for this procedure. ? ? ?Follow-Up: ?1 year ? ?Any Other Special Instructions Will Be Listed Below (If Applicable). ? ?If you need a refill on your cardiac medications before your next appointment, please call your pharmacy. ? ?
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
## 2021-10-15 ENCOUNTER — Telehealth: Payer: Self-pay | Admitting: Clinical

## 2021-10-15 ENCOUNTER — Encounter: Payer: Medicaid Other | Admitting: Obstetrics & Gynecology

## 2021-10-15 NOTE — Telephone Encounter (Signed)
Call regarding referral; pt declines visit at this time; will call [facility] at [phone] as needed in the future.

## 2021-10-28 ENCOUNTER — Ambulatory Visit (HOSPITAL_COMMUNITY)
Admission: RE | Admit: 2021-10-28 | Discharge: 2021-10-28 | Disposition: A | Discharge status: Home / Self Care | Payer: Medicaid Other | Source: Ambulatory Visit | Attending: Student | Admitting: Student

## 2021-10-28 DIAGNOSIS — R002 Palpitations: Secondary | ICD-10-CM | POA: Diagnosis present

## 2021-10-28 LAB — ECHOCARDIOGRAM COMPLETE
Area-P 1/2: 3.27 cm2
S' Lateral: 2.7 cm

## 2021-10-28 NOTE — Progress Notes (Signed)
*  PRELIMINARY RESULTS* Echocardiogram 2D Echocardiogram has been performed.  Patricia Richards 10/28/2021, 1:57 PM

## 2021-11-26 ENCOUNTER — Other Ambulatory Visit: Payer: Self-pay | Admitting: Women's Health

## 2021-12-02 ENCOUNTER — Other Ambulatory Visit: Payer: Self-pay | Admitting: Obstetrics & Gynecology

## 2022-03-02 ENCOUNTER — Encounter (HOSPITAL_COMMUNITY): Payer: Self-pay

## 2022-03-02 ENCOUNTER — Emergency Department (HOSPITAL_COMMUNITY)
Admission: EM | Admit: 2022-03-02 | Discharge: 2022-03-03 | Disposition: A | Discharge status: Home / Self Care | Payer: Medicaid Other | Attending: Emergency Medicine | Admitting: Emergency Medicine

## 2022-03-02 ENCOUNTER — Other Ambulatory Visit: Payer: Self-pay

## 2022-03-02 ENCOUNTER — Emergency Department (HOSPITAL_COMMUNITY): Payer: Medicaid Other

## 2022-03-02 DIAGNOSIS — R079 Chest pain, unspecified: Secondary | ICD-10-CM | POA: Diagnosis present

## 2022-03-02 DIAGNOSIS — M546 Pain in thoracic spine: Secondary | ICD-10-CM | POA: Insufficient documentation

## 2022-03-02 DIAGNOSIS — Z87891 Personal history of nicotine dependence: Secondary | ICD-10-CM | POA: Insufficient documentation

## 2022-03-02 LAB — CBC
HCT: 40.9 % (ref 36.0–46.0)
Hemoglobin: 14 g/dL (ref 12.0–15.0)
MCH: 30.2 pg (ref 26.0–34.0)
MCHC: 34.2 g/dL (ref 30.0–36.0)
MCV: 88.3 fL (ref 80.0–100.0)
Platelets: 255 10*3/uL (ref 150–400)
RBC: 4.63 MIL/uL (ref 3.87–5.11)
RDW: 12.3 % (ref 11.5–15.5)
WBC: 6.1 10*3/uL (ref 4.0–10.5)
nRBC: 0 % (ref 0.0–0.2)

## 2022-03-02 LAB — BASIC METABOLIC PANEL
Anion gap: 7 (ref 5–15)
BUN: 11 mg/dL (ref 6–20)
CO2: 26 mmol/L (ref 22–32)
Calcium: 9.6 mg/dL (ref 8.9–10.3)
Chloride: 106 mmol/L (ref 98–111)
Creatinine, Ser: 0.68 mg/dL (ref 0.44–1.00)
GFR, Estimated: >60 mL/min (ref >60)
Glucose, Bld: 102 mg/dL — ABNORMAL HIGH (ref 70–99)
Potassium: 3.8 mmol/L (ref 3.5–5.1)
Sodium: 139 mmol/L (ref 135–145)

## 2022-03-02 LAB — TROPONIN I (HIGH SENSITIVITY)
Troponin I (High Sensitivity): <2 ng/L (ref <18)
Troponin I (High Sensitivity): <2 ng/L (ref <18)

## 2022-03-02 NOTE — ED Triage Notes (Signed)
Pt report back pain and chest pressure. Pt reports that it feels like its spreading up her neck and head.

## 2022-03-03 LAB — HEPATIC FUNCTION PANEL
ALT: 54 U/L — ABNORMAL HIGH (ref 0–44)
AST: 32 U/L (ref 15–41)
Albumin: 4.7 g/dL (ref 3.5–5.0)
Alkaline Phosphatase: 72 U/L (ref 38–126)
Bilirubin, Direct: 0.1 mg/dL (ref 0.0–0.2)
Indirect Bilirubin: 0.1 mg/dL — ABNORMAL LOW (ref 0.3–0.9)
Total Bilirubin: 0.2 mg/dL — ABNORMAL LOW (ref 0.3–1.2)
Total Protein: 8 g/dL (ref 6.5–8.1)

## 2022-03-03 LAB — D-DIMER, QUANTITATIVE: D-Dimer, Quant: <0.27 ug/mL-FEU (ref 0.00–0.50)

## 2022-03-03 LAB — LIPASE, BLOOD: Lipase: 41 U/L (ref 11–51)

## 2022-03-03 NOTE — Discharge Instructions (Signed)
You were evaluated in the Emergency Department and after careful evaluation, we did not find any emergent condition requiring admission or further testing in the hospital.  Your exam/testing today was overall reassuring.  Your lab testing does not show any signs of heart strain or blood clots.  Your liver numbers are also reassuring.  Suspect your symptoms are due to muscle strain or spasm.  Recommend Tylenol or Motrin at home for discomfort.  Please return to the Emergency Department if you experience any worsening of your condition.  Thank you for allowing [place] to be a part of your care.
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
## 2022-04-20 ENCOUNTER — Ambulatory Visit: Payer: Medicaid Other | Admitting: Obstetrics & Gynecology

## 2022-05-03 ENCOUNTER — Ambulatory Visit: Payer: Medicaid Other | Admitting: Obstetrics & Gynecology

## 2022-05-15 ENCOUNTER — Other Ambulatory Visit: Payer: Self-pay

## 2022-05-15 ENCOUNTER — Emergency Department (HOSPITAL_COMMUNITY): Payer: Medicaid Other

## 2022-05-15 ENCOUNTER — Emergency Department (HOSPITAL_COMMUNITY)
Admission: EM | Admit: 2022-05-15 | Discharge: 2022-05-15 | Disposition: A | Discharge status: Home / Self Care | Payer: Medicaid Other | Attending: Emergency Medicine | Admitting: Emergency Medicine

## 2022-05-15 DIAGNOSIS — Z1152 Encounter for screening for COVID-19: Secondary | ICD-10-CM | POA: Insufficient documentation

## 2022-05-15 DIAGNOSIS — R42 Dizziness and giddiness: Secondary | ICD-10-CM | POA: Diagnosis present

## 2022-05-15 DIAGNOSIS — R079 Chest pain, unspecified: Secondary | ICD-10-CM | POA: Insufficient documentation

## 2022-05-15 LAB — URINALYSIS, ROUTINE W REFLEX MICROSCOPIC
Bilirubin Urine: NEGATIVE
Glucose, UA: NEGATIVE mg/dL
Hgb urine dipstick: NEGATIVE
Ketones, ur: NEGATIVE mg/dL
Leukocytes,Ua: NEGATIVE
Nitrite: NEGATIVE
Protein, ur: 30 mg/dL — AB
Specific Gravity, Urine: 1.026 (ref 1.005–1.030)
pH: 6 (ref 5.0–8.0)

## 2022-05-15 LAB — RESP PANEL BY RT-PCR (RSV, FLU A&B, COVID)  RVPGX2
Influenza A by PCR: NEGATIVE
Influenza B by PCR: NEGATIVE
Resp Syncytial Virus by PCR: NEGATIVE
SARS Coronavirus 2 by RT PCR: NEGATIVE

## 2022-05-15 LAB — CBC
HCT: 39.6 % (ref 36.0–46.0)
Hemoglobin: 13.2 g/dL (ref 12.0–15.0)
MCH: 29.5 pg (ref 26.0–34.0)
MCHC: 33.3 g/dL (ref 30.0–36.0)
MCV: 88.6 fL (ref 80.0–100.0)
Platelets: 300 10*3/uL (ref 150–400)
RBC: 4.47 MIL/uL (ref 3.87–5.11)
RDW: 12.3 % (ref 11.5–15.5)
WBC: 6.8 10*3/uL (ref 4.0–10.5)
nRBC: 0 % (ref 0.0–0.2)

## 2022-05-15 LAB — BASIC METABOLIC PANEL
Anion gap: 7 (ref 5–15)
BUN: 12 mg/dL (ref 6–20)
CO2: 27 mmol/L (ref 22–32)
Calcium: 9.5 mg/dL (ref 8.9–10.3)
Chloride: 106 mmol/L (ref 98–111)
Creatinine, Ser: 0.79 mg/dL (ref 0.44–1.00)
GFR, Estimated: >60 mL/min (ref >60)
Glucose, Bld: 106 mg/dL — ABNORMAL HIGH (ref 70–99)
Potassium: 3.7 mmol/L (ref 3.5–5.1)
Sodium: 140 mmol/L (ref 135–145)

## 2022-05-15 LAB — POC URINE PREG, ED: Preg Test, Ur: NEGATIVE

## 2022-05-15 MED ORDER — MECLIZINE HCL 12.5 MG PO TABS
25.0000 mg | ORAL_TABLET | Freq: Once | ORAL | Status: AC
  Administered 2022-05-15: 25 mg via ORAL
  Filled 2022-05-15: qty 2

## 2022-05-15 MED ORDER — LACTATED RINGERS IV BOLUS: 1000.0000 mL | Freq: Once | INTRAVENOUS | Status: DC

## 2022-05-15 NOTE — ED Notes (Signed)
Pt given cup of water, drank 100%, pt requesting gingerale now, pt given gingerale

## 2022-05-15 NOTE — ED Triage Notes (Signed)
Dizziness started last Wednesday, Cough, chest pain, back and neck pain x 2 days. Denies fevers. Pt had Flu 2 weeks ago. Pt states hx of vertigo  but has never been this dizzy before.

## 2022-05-15 NOTE — Discharge Instructions (Signed)
Thank you for coming to [facility] Emergency Department. You were seen for dizziness, chest pain. We did an exam, labs, and imaging, and these showed  no acute findings. Please take the meclizine as prescribed by your primary doctor. Stay well hydrated. You can alternate taking Tylenol and ibuprofen as needed for pain. You can take '650mg'$  tylenol (acetaminophen) every 4-6 hours. You can take 600 mg ibuprofen 3 times a day for one week. Please follow up with your primary care provider within 1 week.   Do not hesitate to return to the ED or call 911 if you experience: -Worsening symptoms -Chest pain, shortness of breath -Numbness/tingling, confusion, slurred speech, difficulty swallowing, weakness on one side or the other -Falls -Lightheadedness, passing out -Fevers/chills -Anything else that concerns you

## 2022-05-15 NOTE — ED Notes (Signed)
Pt says her ears feel like "loud" and that is when the dizziness started about week ago.

## 2022-05-15 NOTE — ED Triage Notes (Deleted)
BIB daughter for SOB and decreased O2 sats. Pt is not on oxygen at home. Pt sats were 87% on room air from home pulse ox. Pt has hx of dementia but daugther states pt has not been acting himself for the past couple of days. Pt denies any pain or SOB. Pt 87% on room air in Triage and placed on 3l o2.

## 2022-05-15 NOTE — ED Notes (Signed)
Pt given fluids PO

## 2022-05-15 NOTE — ED Provider Notes (Incomplete)
[facility] EMERGENCY DEPARTMENT Provider Note   CSN: [id number] Arrival date & time: 05/15/22  1659     History {Add pertinent medical, surgical, social history, OB history to HPI:1} Chief Complaint  Patient presents with   Chest Pain   Dizziness    Patricia Richards is a 31 y.o. female with h/o depression/anxiety, alcohol abuse, h/o IVDU, who presents with dizziness.   Patient reports severe dizziness x 1 week. Has had "dizzy spells" before but nothing this severe or lasting this long. Had influenza A last Monday, w/ fever and cough. Had azithromycin and prednisone prescribed but didn't take the prednisone. Hasn't had more fevers since last week. Denies nausea/vomiting. Endorses chest pain located centrally, dull ache, comes and goes. Does not feel it currently. Endorses some DOE with moving around. States she feels these symptoms with anxiety as well and panic attacks that she manages with klonopin.   Chest Pain Associated symptoms: dizziness   Dizziness Associated symptoms: chest pain        Home Medications Prior to Admission medications   Medication Sig Start Date End Date Taking? Authorizing Provider  acetaminophen (TYLENOL) 325 MG tablet Take 2 tablets (650 mg total) by mouth every 4 (four) hours as needed for up to 180 doses (for pain scale < 4). 07/11/21   Darlina Rumpf, CNM  Blood Pressure Monitor MISC For regular home bp monitoring during pregnancy Patient not taking: Reported on 10/14/2021 01/05/21   Roma Schanz, CNM  clonazePAM (KLONOPIN) 1 MG tablet TAKE (1) TABLET BY MOUTH THREE TIMES DAILY 08/20/21   Cresenzo-Dishmon, Joaquim Lai, CNM  etonogestrel-ethinyl estradiol (NUVARING) 0.12-0.015 MG/24HR vaginal ring Insert vaginally and leave in place for 3 consecutive weeks, then remove for 1 week. 08/20/21   Cresenzo-Dishmon, Joaquim Lai, CNM  ibuprofen (ADVIL) 600 MG tablet Take 1 tablet (600 mg total) by mouth every 6 (six) hours as needed for cramping. Patient not  taking: Reported on 08/20/2021 07/11/21   Darlina Rumpf, CNM  omeprazole (PRILOSEC) 20 MG capsule TAKE 1 TABLET BY MOUTH ONCE DAILY. 12/30/21   Florian Buff, MD  Prenatal Vit-Fe Fumarate-FA (PRENATAL VITAMIN PO) Take by mouth. Patient not taking: Reported on 10/14/2021    [provider]  promethazine (PHENERGAN) 25 MG tablet Take 0.5-1 tablets (12.5-25 mg total) by mouth every 6 (six) hours as needed for nausea or vomiting. Patient not taking: Reported on 07/02/2021 01/05/21   Roma Schanz, CNM      Allergies    Metronidazole, Procardia xl [nifedipine er], Tramadol, Reglan [metoclopramide hcl], and Toradol [ketorolac tromethamine]    Review of Systems   Review of Systems  Cardiovascular:  Positive for chest pain.  Neurological:  Positive for dizziness.   Review of systems {pos/neg:18640::"Negative","Positive"} for ***.  A 10 point review of systems was performed and is negative unless otherwise reported in HPI.  Physical Exam Updated Vital Signs BP 111/82   Pulse 80   Temp 98.2 F (36.8 C) (Oral)   Resp 16   Ht '5\' 3"'$  (1.6 m)   Wt 77.1 kg   LMP 05/06/2022 (Exact Date)   SpO2 99%   BMI 30.11 kg/m  Physical Exam General: Normal appearing {Desc; female/female:11659}, lying in bed.  HEENT: PERRLA, Sclera anicteric, MMM, trachea midline.  Cardiology: RRR, no murmurs/rubs/gallops. BL radial and DP pulses equal bilaterally.  Resp: Normal respiratory rate and effort. CTAB, no wheezes, rhonchi, crackles.  Abd: Soft, non-tender, non-distended. No rebound tenderness or guarding.  GU: Deferred. MSK: No peripheral  edema or signs of trauma. Extremities without deformity or TTP. No cyanosis or clubbing. Skin: warm, dry. No rashes or lesions. Back: No CVA tenderness Neuro: A&Ox4, CNs II-XII grossly intact. MAEs. Sensation grossly intact.  Psych: Normal mood and affect.   ED Results / Procedures / Treatments   Labs (all labs ordered are listed, but only abnormal results  are displayed) Labs Reviewed  BASIC METABOLIC PANEL - Abnormal; Notable for the following components:      Result Value   Glucose, Bld 106 (*)    All other components within normal limits  URINALYSIS, ROUTINE W REFLEX MICROSCOPIC - Abnormal; Notable for the following components:   APPearance HAZY (*)    Protein, ur 30 (*)    Bacteria, UA RARE (*)    All other components within normal limits  RESP PANEL BY RT-PCR (RSV, FLU A&B, COVID)  RVPGX2  CBC  POC URINE PREG, ED    EKG EKG Interpretation  Date/Time:  Saturday May 15 2022 17:49:01 EST Ventricular Rate:  93 PR Interval:  150 QRS Duration: 78 QT Interval:  358 QTC Calculation: 445 R Axis:   86 Text Interpretation: Normal sinus rhythm When compared with ECG of 02-Mar-2022 18:15, Nonspecific T wave abnormality now evident in Anterior leads Confirmed by Cindee Lame [phone]) on 05/15/2022 8:02:12 PM  Radiology DG Chest 2 View  Result Date: 05/15/2022 CLINICAL DATA:  chest pain /dizziness EXAM: CHEST - 2 VIEW COMPARISON:  03/02/2022 FINDINGS: Cardiac silhouette is unremarkable. No pneumothorax or pleural effusion. The lungs are clear. Convex right midthoracic scoliosis identified with 21 degree angulation. Osseous structures are otherwise intact. IMPRESSION: No acute cardiopulmonary process. Electronically Signed   By: Sammie Bench M.D.   On: 05/15/2022 18:19    Procedures Procedures  {Document cardiac monitor, telemetry assessment procedure when appropriate:1}  Medications Ordered in ED Medications  meclizine (ANTIVERT) tablet 25 mg (25 mg Oral Given 05/15/22 2259)    ED Course/ Medical Decision Making/ A&P                          Medical Decision Making Amount and/or Complexity of Data Reviewed Labs: ordered. Decision-making details documented in ED Course. Radiology: ordered. Decision-making details documented in ED Course.    This patient presents to the ED for concern of ***, this involves an extensive  number of treatment options, and is a complaint that carries with it a high risk of complications and morbidity.  I considered the following differential and admission for this acute, potentially life threatening condition.   MDM:    ***  Clinical Course as of 05/15/22 2322  Sat May 15, 2022  2001 Resp panel by RT-PCR (RSV, Flu A&B, Covid) Anterior Nasal Swab Neg [HN]  2001 Hemoglobin: 13.2 [HN]  2001 WBC: 6.8 [HN]  5701 Basic metabolic panel(!) wnl [HN]  2003 DG Chest 2 View FINDINGS: Cardiac silhouette is unremarkable. No pneumothorax or pleural effusion. The lungs are clear. Convex right midthoracic scoliosis identified with 21 degree angulation. Osseous structures are otherwise intact.  IMPRESSION: No acute cardiopulmonary process.   [HN]  2140 Urinalysis, Routine w reflex microscopic Urine, Clean Catch(!) Unremarkable [HN]  2140 Preg Test, Ur: NEGATIVE [HN]  7793 Basic metabolic panel(!) unremarkable [HN]  2140 WBC: 6.8 [HN]  2140 Hemoglobin: 13.2 [HN]  2140 DG Chest 2 View FINDINGS: Cardiac silhouette is unremarkable. No pneumothorax or pleural effusion. The lungs are clear. Convex right midthoracic scoliosis identified with 21 degree angulation. Osseous  structures are otherwise intact.  IMPRESSION: No acute cardiopulmonary process.   [HN]  2140 Orthostatic vitals okay [HN]  2319 Patient states that she went through her Google and found that meclizine was the drug that her family practice physician actually prescribed to her on Friday but she has not had a chance to pick it up yet.  She was recommended to take a half a pill in the morning and if the symptoms have not resolved then she can take another half of a pill.  Patient states that she is relieved that her workup is negative and that she feels well enough to go home at this point.  Patient had a reassuring EKG, chest x-ray, UA, CBC/BMP, and negative respiratory panel.  She has no nystagmus and her orthostatic  vital signs were reassuring.  She has been vitally stable throughout her greater than 6-hour stay in the emergency department.  She is very well-appearing overall and I believe that patient is stable to be discharged with outpatient follow-up.  She already has the meclizine prescription that she will pick up tomorrow.  Patient we discharged with discharge instructions and return precautions, all questions answered to patient satisfaction. [HN]    Clinical Course User Index [HN] Audley Hose, MD    Labs: I Ordered, and personally interpreted labs.  The pertinent results include:  ***  Imaging Studies ordered: I ordered imaging studies including *** I independently visualized and interpreted imaging. I agree with the radiologist interpretation  Additional history obtained from ***.  External records from outside source obtained and reviewed including ***  Cardiac Monitoring: The patient was maintained on a cardiac monitor.  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,gets off street   Panic attacks    Tachycardia    Urinary tract infection    gets them frequently   Vaginal Pap smear, abnormal      Medicines Meds ordered this encounter  Medications   DISCONTD: lactated ringers bolus 1,000 mL   meclizine (ANTIVERT) tablet 25 mg    I have reviewed the patients home medicines and have made  adjustments as needed  Problem List / ED Course: Problem List Items Addressed This Visit   None Visit Diagnoses     Dizziness    -  Primary   Chest pain, unspecified type                {Document critical care time when appropriate:1} {Document review of labs and clinical decision tools ie heart score, Chads2Vasc2 etc:1}  {Document your independent review of radiology images, and any outside records:1} {Document your discussion with family members, caretakers, and with consultants:1} {Document social determinants of health affecting pt's care:1} {Document your decision making why or why not admission, treatments were needed:1}  This note was created using dictation software, which may contain spelling or grammatical errors.

## 2022-05-20 ENCOUNTER — Ambulatory Visit: Payer: Medicaid Other | Admitting: Obstetrics & Gynecology

## 2022-05-20 ENCOUNTER — Ambulatory Visit: Payer: Medicaid Other | Admitting: Advanced Practice Midwife

## 2022-05-28 ENCOUNTER — Encounter: Payer: Self-pay | Admitting: Internal Medicine

## 2022-06-17 ENCOUNTER — Ambulatory Visit: Payer: Medicaid Other | Attending: Cardiology | Admitting: Cardiology

## 2022-06-17 ENCOUNTER — Encounter: Payer: Self-pay | Admitting: Cardiology

## 2022-06-17 VITALS — BP 120/74 | HR 86 | Ht 64.0 in | Wt 167.0 lb

## 2022-06-17 DIAGNOSIS — F1011 Alcohol abuse, in remission: Secondary | ICD-10-CM | POA: Diagnosis present

## 2022-06-17 DIAGNOSIS — R002 Palpitations: Secondary | ICD-10-CM

## 2022-06-17 DIAGNOSIS — F419 Anxiety disorder, unspecified: Secondary | ICD-10-CM | POA: Insufficient documentation

## 2022-06-17 NOTE — Patient Instructions (Signed)
Medication Instructions:  Your physician recommends that you continue on your current medications as directed. Please refer to the Current Medication list given to you today.   Labwork: None  Testing/Procedures: None  Follow-Up: Follow up with Bernerd Pho, PA-C in 6 months.   Any Other Special Instructions Will Be Listed Below (If Applicable).     If you need a refill on your cardiac medications before your next appointment, please call your pharmacy.
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
## 2022-07-26 ENCOUNTER — Ambulatory Visit: Payer: Medicaid Other | Admitting: Internal Medicine

## 2022-07-26 NOTE — Progress Notes (Signed)
Pt did not show for scheduled appointment.  

## 2022-11-08 ENCOUNTER — Ambulatory Visit: Payer: Medicaid Other | Admitting: Advanced Practice Midwife

## 2022-11-28 ENCOUNTER — Emergency Department (HOSPITAL_COMMUNITY)
Admission: EM | Admit: 2022-11-28 | Discharge: 2022-11-29 | Disposition: A | Discharge status: Home / Self Care | Payer: MEDICAID | Attending: Emergency Medicine | Admitting: Emergency Medicine

## 2022-11-28 ENCOUNTER — Other Ambulatory Visit: Payer: Self-pay

## 2022-11-28 ENCOUNTER — Encounter (HOSPITAL_COMMUNITY): Payer: Self-pay | Admitting: Emergency Medicine

## 2022-11-28 ENCOUNTER — Emergency Department (HOSPITAL_COMMUNITY): Payer: MEDICAID

## 2022-11-28 DIAGNOSIS — R0602 Shortness of breath: Secondary | ICD-10-CM

## 2022-11-28 DIAGNOSIS — Z87891 Personal history of nicotine dependence: Secondary | ICD-10-CM | POA: Insufficient documentation

## 2022-11-28 DIAGNOSIS — M546 Pain in thoracic spine: Secondary | ICD-10-CM | POA: Diagnosis not present

## 2022-11-28 DIAGNOSIS — R079 Chest pain, unspecified: Secondary | ICD-10-CM | POA: Diagnosis not present

## 2022-11-28 MED ORDER — ALBUTEROL SULFATE HFA 108 (90 BASE) MCG/ACT IN AERS
2.0000 | INHALATION_SPRAY | RESPIRATORY_TRACT | Status: DC | PRN
  Administered 2022-11-28: 2 via RESPIRATORY_TRACT
  Filled 2022-11-28: qty 6.7

## 2022-11-28 NOTE — ED Triage Notes (Signed)
Pt via POV c/o SOB x 2 days. Pt denies hx asthma but states she has used inhalers in the past (currently out of meds). Pt states she has an unusual pressure in her chest and has noticed tingling in fingers and occasional dizziness.

## 2022-11-29 LAB — CBC WITH DIFFERENTIAL/PLATELET
Abs Immature Granulocytes: 0.02 10*3/uL (ref 0.00–0.07)
Basophils Absolute: 0 10*3/uL (ref 0.0–0.1)
Basophils Relative: 0 %
Eosinophils Absolute: 0.1 10*3/uL (ref 0.0–0.5)
Eosinophils Relative: 1 %
HCT: 43 % (ref 36.0–46.0)
Hemoglobin: 14.3 g/dL (ref 12.0–15.0)
Immature Granulocytes: 0 %
Lymphocytes Relative: 46 %
Lymphs Abs: 3.6 10*3/uL (ref 0.7–4.0)
MCH: 29.4 pg (ref 26.0–34.0)
MCHC: 33.3 g/dL (ref 30.0–36.0)
MCV: 88.5 fL (ref 80.0–100.0)
Monocytes Absolute: 0.4 10*3/uL (ref 0.1–1.0)
Monocytes Relative: 5 %
Neutro Abs: 3.7 10*3/uL (ref 1.7–7.7)
Neutrophils Relative %: 48 %
Platelets: 283 10*3/uL (ref 150–400)
RBC: 4.86 MIL/uL (ref 3.87–5.11)
RDW: 13.1 % (ref 11.5–15.5)
WBC: 7.8 10*3/uL (ref 4.0–10.5)
nRBC: 0 % (ref 0.0–0.2)

## 2022-11-29 LAB — HCG, QUANTITATIVE, PREGNANCY: hCG, Beta Chain, Quant, S: <1 m[IU]/mL (ref <5)

## 2022-11-29 LAB — URINALYSIS, ROUTINE W REFLEX MICROSCOPIC
Bilirubin Urine: NEGATIVE
Glucose, UA: NEGATIVE mg/dL
Hgb urine dipstick: NEGATIVE
Ketones, ur: NEGATIVE mg/dL
Leukocytes,Ua: NEGATIVE
Nitrite: NEGATIVE
Protein, ur: NEGATIVE mg/dL
Specific Gravity, Urine: 1.017 (ref 1.005–1.030)
pH: 6 (ref 5.0–8.0)

## 2022-11-29 LAB — BASIC METABOLIC PANEL
Anion gap: 11 (ref 5–15)
BUN: 12 mg/dL (ref 6–20)
CO2: 23 mmol/L (ref 22–32)
Calcium: 9.8 mg/dL (ref 8.9–10.3)
Chloride: 102 mmol/L (ref 98–111)
Creatinine, Ser: 0.74 mg/dL (ref 0.44–1.00)
GFR, Estimated: >60 mL/min (ref >60)
Glucose, Bld: 84 mg/dL (ref 70–99)
Potassium: 3.7 mmol/L (ref 3.5–5.1)
Sodium: 136 mmol/L (ref 135–145)

## 2022-11-29 LAB — TROPONIN I (HIGH SENSITIVITY)
Troponin I (High Sensitivity): <2 ng/L (ref <18)
Troponin I (High Sensitivity): <2 ng/L (ref <18)

## 2022-11-29 LAB — D-DIMER, QUANTITATIVE: D-Dimer, Quant: 0.28 ug/mL-FEU (ref 0.00–0.50)
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
## 2022-11-29 NOTE — ED Notes (Signed)
ED Provider at bedside. 

## 2022-11-29 NOTE — Discharge Instructions (Signed)
You were evaluated in the Emergency Department and after careful evaluation, we did not find any emergent condition requiring admission or further testing in the hospital.  Your exam/testing today was overall reassuring.  No signs of heart damage or blood clots.  Symptoms likely due to a muscle strain or spasm.  Use Tylenol or Motrin at home for discomfort.  Please return to the Emergency Department if you experience any worsening of your condition.  Thank you for allowing [place] to be a part of your care.

## 2022-12-13 NOTE — Progress Notes (Unsigned)
Cardiology Office Note   Date:  12/16/2022   ID:  JAHKAYLA BARRERO, DOB [birth year], MRN [id number]  PCP:  [facility]  Cardiologist:   Dietrich Pates, MD   Pt presents for follow up of palpitations     History of Present Illness: Patricia Richards is a 32 y.o. female with a history of palpitations, bradycardia bipolar disorder, panic/PTSD, hx substance abuse   I saw the pt in clinic back in 2020 when she was pregnant with 7th pregnancy     She was last seen in cardiology by B Hammock in Feb 2024 NOted palpitations primarily when drinking EtOH  Rx with Klonopin   Pt seen in July 2024 in ER for SOB  Given inhaler   The pt says her breathing is OK   She says she gets SOB only when stressed  She says her heart races only when she is drinking    She is cutting back on this   Did drink about 6 to 12 shots 2 days ago.   Heart iniitially fast  Then slowed   She worries about slowing, never had before    HR 60s    Denies dizziness With stress will get SOB   Has only used 2 times     Doing some swimming  Wants to lose weight    Current Meds  Medication Sig   acetaminophen (TYLENOL) 325 MG tablet Take 2 tablets (650 mg total) by mouth every 4 (four) hours as needed for up to 180 doses (for pain scale < 4).   atorvastatin (LIPITOR) 20 MG tablet Take 20 mg by mouth at bedtime.   Blood Pressure Monitor MISC For regular home bp monitoring during pregnancy   clonazePAM (KLONOPIN) 1 MG tablet TAKE (1) TABLET BY MOUTH THREE TIMES DAILY   etonogestrel-ethinyl estradiol (NUVARING) 0.12-0.015 MG/24HR vaginal ring Insert vaginally and leave in place for 3 consecutive weeks, then remove for 1 week.   ibuprofen (ADVIL) 600 MG tablet Take 1 tablet (600 mg total) by mouth every 6 (six) hours as needed for cramping.   omeprazole (PRILOSEC) 20 MG capsule TAKE 1 TABLET BY MOUTH ONCE DAILY.     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 Her smoking use included cigarettes. She has never used smokeless tobacco. She reports that she does not currently use alcohol. She reports that she does not currently use drugs after having used the following drugs: Other-see comments, Marijuana, Cocaine, and MDMA (Ecstacy).   Family History:  The patient's family history includes Bipolar disorder in her cousin; Cancer in her father and maternal grandmother; Cerebral palsy in her mother; Diabetes in her  father; Down syndrome in her cousin; Heart attack in her paternal grandmother; Hyperlipidemia in her father; Hypertension in her father; Mental illness in her father and maternal grandmother; Other in her brother; Stroke in her maternal grandmother.    ROS:  Please see the history of present illness. All other systems are reviewed and  Negative to the above problem except as noted.    PHYSICAL EXAM: VS:  BP 106/76 (BP Location: Left Arm, Patient Position: Sitting, Cuff Size: Normal)   Pulse 77   Ht 5\' 4"  (1.626 m)   Wt 166 lb 12.8 oz (75.7 kg)   LMP 09/19/2022 (Approximate)   SpO2 98%   BMI 28.63 kg/m   GEN: Well nourished, well developed, in no acute distress  HEENT: normal  Neck: no JVD, no carotid bruit  Cardiac: RRR; no murmur  No LE edema Respiratory:  clear to auscultation bilaterally GI: soft,  nontender, nondistended No hepatomegaly  MS: no deformity Moving all extremities   Skin: warm and dry, no rash  Multiple tattoos, piercing  EKG:  EKG is not ordered today.  Echo   2023  1. Left ventricular ejection fraction, by estimation, is 55 to 60%. The  left ventricle has normal function. The left ventricle has no regional  wall motion abnormalities. Left ventricular diastolic parameters were  normal.   2. Right ventricular systolic function is normal. The right ventricular  size is normal. There is normal pulmonary artery systolic pressure. The  estimated right ventricular systolic pressure is 16.8 mmHg.   3. The mitral valve is normal in structure. No evidence of mitral valve  regurgitation. No evidence of mitral stenosis.   4. The aortic valve is normal in structure. Aortic valve regurgitation is  not visualized. No aortic stenosis is present.   5. The inferior vena cava is normal in size with greater than 50%  respiratory variability, suggesting right atrial pressure of 3 mmHg.    Lipid Panel No results found for: "CHOL", "TRIG", "HDL", "CHOLHDL", "VLDL", "LDLCALC", "LDLDIRECT"    Wt Readings from Last 3 Encounters:  12/16/22 166 lb 12.8 oz (75.7 kg)  11/28/22 170 lb (77.1 kg)  06/17/22 167 lb (75.8 kg)      ASSESSMENT AND PLAN:  1  Heart rate  Pt with hx palpitations   Usually associated with EtOH  ALso notes some slower heart rates   She worries about this     Overall deneis dizziness. Would recomm a 48 hour monitor to evaluate ranges      2   Dyspnea   Moving air well   No SOB  3  Hx EtOH abuse  Congratulated her on progress.  Drinking less   Keep working at this  4  Diet  Reviewed    REcomm low carb, minimally processed foods   Lots of veggies   Avoid SSB    Follow up in 1 year    Current medicines are reviewed at length with the patient today.  The patient does not have concerns regarding medicines.  Signed, Dietrich Pates, MD  12/16/2022 10:42 AM    [facility]  [facility] [address] [place], [place], [place]  [address] Phone: [phone]; Fax: [phone]

## 2022-12-16 ENCOUNTER — Ambulatory Visit: Payer: MEDICAID

## 2022-12-16 ENCOUNTER — Ambulatory Visit: Payer: MEDICAID | Attending: Internal Medicine | Admitting: Internal Medicine

## 2022-12-16 ENCOUNTER — Encounter: Payer: Self-pay | Admitting: Internal Medicine

## 2022-12-16 VITALS — BP 106/76 | HR 77 | Ht 64.0 in | Wt 166.8 lb

## 2022-12-16 DIAGNOSIS — R002 Palpitations: Secondary | ICD-10-CM

## 2022-12-16 NOTE — Patient Instructions (Signed)
Medication Instructions:  Your physician recommends that you continue on your current medications as directed. Please refer to the Current Medication list given to you today.   Labwork: None  Testing/Procedures: 48 hr Zio Monitor- Palpitations  Follow-Up: Follow up with Dr. Tenny Craw in 1 year.   Any Other Special Instructions Will Be Listed Below (If Applicable).     If you need a refill on your cardiac medications before your next appointment, please call your pharmacy.   ZIO XT- Long Term Monitor Instructions   Your physician has requested you wear your ZIO patch monitor___2____days.   This is a single patch monitor.  [employer] supplies one patch monitor per enrollment.  Additional stickers are not available.   Please do not apply patch if you will be having a Nuclear Stress Test, Echocardiogram, Cardiac CT, MRI, or Chest Xray during the time frame you would be wearing the monitor. The patch cannot be worn during these tests.  You cannot remove and re-apply the ZIO XT patch monitor.   Your ZIO patch monitor will be sent USPS Priority mail from [facility] directly to your home address. The monitor may also be mailed to a PO BOX if home delivery is not available.   It may take 3-5 days to receive your monitor after you have been enrolled.   Once you have received you monitor, please review enclosed instructions.  Your monitor has already been registered assigning a specific monitor serial # to you.   Applying the monitor   Shave hair from upper left chest.   Hold abrader disc by orange tab.  Rub abrader in 40 strokes over left upper chest as indicated in your monitor instructions.   Clean area with 4 enclosed alcohol pads .  Use all pads to assure are is cleaned thoroughly.  Let dry.   Apply patch as indicated in monitor instructions.  Patch will be place under collarbone on left side of chest with arrow pointing upward.   Rub patch adhesive wings for 2 minutes.Remove  white label marked "1".  Remove white label marked "2".  Rub patch adhesive wings for 2 additional minutes.   While looking in a mirror, press and release button in center of patch.  A small green light will flash 3-4 times .  This will be your only indicator the monitor has been turned on.     Do not shower for the first 24 hours.  You may shower after the first 24 hours.   Press button if you feel a symptom. You will hear a small click.  Record Date, Time and Symptom in the Patient Log Book.   When you are ready to remove patch, follow instructions on last 2 pages of Patient Log Book.  Stick patch monitor onto last page of Patient Log Book.   Place Patient Log Book in [place] box.  Use locking tab on box and tape box closed securely.  The [employer] and [employer] has [employer] on it.  Please place in mailbox as soon as possible.  Your physician should have your test results approximately 7 days after the monitor has been mailed back to [facility].   Call [facility] Customer Care at [phone] if you have questions regarding your ZIO XT patch monitor.  Call them immediately if you see an orange light blinking on your monitor.   If your monitor falls off in less than 4 days contact our Monitor department at [phone].  If your monitor becomes loose or  falls off after 4 days call [employer] at [phone] for suggestions on securing your monitor.

## 2022-12-28 ENCOUNTER — Telehealth: Payer: Self-pay | Admitting: Internal Medicine

## 2022-12-28 NOTE — Telephone Encounter (Signed)
Patient is requesting a call back to discuss monitor results. 

## 2022-12-28 NOTE — Telephone Encounter (Signed)
Spoke with patient to answer questions and review Dr Charlott Rakes comments from recent heart monitor results.  Provided education on cardia arrhythmias and triggers.  Patient has additional questions about her heart and if it is safe for her to have another pregnancy in the next few years. Advised patient I would forward her question to Dr Tenny Craw for review.

## 2022-12-30 NOTE — Telephone Encounter (Signed)
The patient has been notified of the result and verbalized understanding.  All questions (if any) were answered.     

## 2022-12-30 NOTE — Telephone Encounter (Signed)
Monitor is normal   No arrhythmias detected   Normal sinus rhythm    I do think it is safe for her to get pregnant    No restrictions on activity

## 2023-01-03 ENCOUNTER — Encounter (HOSPITAL_COMMUNITY): Payer: Self-pay | Admitting: Emergency Medicine

## 2023-01-03 ENCOUNTER — Emergency Department (HOSPITAL_COMMUNITY)
Admission: EM | Admit: 2023-01-03 | Discharge: 2023-01-04 | Disposition: A | Discharge status: Home / Self Care | Payer: MEDICAID | Attending: Student | Admitting: Student

## 2023-01-03 ENCOUNTER — Other Ambulatory Visit: Payer: Self-pay

## 2023-01-03 DIAGNOSIS — Z87891 Personal history of nicotine dependence: Secondary | ICD-10-CM | POA: Diagnosis not present

## 2023-01-03 DIAGNOSIS — R519 Headache, unspecified: Secondary | ICD-10-CM

## 2023-01-03 NOTE — ED Triage Notes (Signed)
Pt c/o of headache x3 weeks that comes and goes.

## 2023-01-04 ENCOUNTER — Emergency Department (HOSPITAL_COMMUNITY): Payer: MEDICAID

## 2023-01-04 LAB — I-STAT CHEM 8, ED
BUN: 8 mg/dL (ref 6–20)
BUN: 8 mg/dL (ref 6–20)
Calcium, Ion: 1.22 mmol/L (ref 1.15–1.40)
Calcium, Ion: 1.24 mmol/L (ref 1.15–1.40)
Chloride: 102 mmol/L (ref 98–111)
Chloride: 102 mmol/L (ref 98–111)
Creatinine, Ser: 0.7 mg/dL (ref 0.44–1.00)
Creatinine, Ser: 0.8 mg/dL (ref 0.44–1.00)
Glucose, Bld: 97 mg/dL (ref 70–99)
Glucose, Bld: 97 mg/dL (ref 70–99)
HCT: 39 % (ref 36.0–46.0)
HCT: 40 % (ref 36.0–46.0)
Hemoglobin: 13.3 g/dL (ref 12.0–15.0)
Hemoglobin: 13.6 g/dL (ref 12.0–15.0)
Potassium: 3.6 mmol/L (ref 3.5–5.1)
Potassium: 3.6 mmol/L (ref 3.5–5.1)
Sodium: 140 mmol/L (ref 135–145)
Sodium: 141 mmol/L (ref 135–145)
TCO2: 22 mmol/L (ref 22–32)
TCO2: 22 mmol/L (ref 22–32)

## 2023-01-04 MED ORDER — PROCHLORPERAZINE EDISYLATE 10 MG/2ML IJ SOLN: 10.0000 mg | Freq: Once | INTRAMUSCULAR | Status: DC

## 2023-01-04 MED ORDER — NAPROXEN 250 MG PO TABS
500.0000 mg | ORAL_TABLET | Freq: Once | ORAL | Status: AC
  Administered 2023-01-04: 500 mg via ORAL
  Filled 2023-01-04: qty 2

## 2023-01-04 MED ORDER — DIPHENHYDRAMINE HCL 50 MG/ML IJ SOLN: 25.0000 mg | Freq: Once | INTRAMUSCULAR | Status: DC

## 2023-01-04 MED ORDER — LACTATED RINGERS IV BOLUS
1000.0000 mL | Freq: Once | INTRAVENOUS | Status: AC
  Administered 2023-01-04: 1000 mL via INTRAVENOUS

## 2023-01-04 MED ORDER — KETOROLAC TROMETHAMINE 15 MG/ML IJ SOLN
15.0000 mg | Freq: Once | INTRAMUSCULAR | Status: DC
  Filled 2023-01-04: qty 1

## 2023-01-04 MED ORDER — IOHEXOL 350 MG/ML SOLN
75.0000 mL | Freq: Once | INTRAVENOUS | Status: AC | PRN
  Administered 2023-01-04: 75 mL via INTRAVENOUS

## 2023-01-04 NOTE — ED Provider Notes (Signed)
[place] EMERGENCY DEPARTMENT AT [facility] Provider Note  CSN: [id number] Arrival date & time: 01/03/23 1800  Chief Complaint(s) Headache  HPI Patricia Richards is a 32 y.o. female with PMH bipolar 1, previous drug use, depression who presents emergency department for evaluation of headache.  She states that the headache has been intermittent for the last 3 weeks and is sometimes associated with nausea.  No early nighttime awakening or true vomiting.  She states that she is primarily concerned about this headache because she had a cousin who died at the age of 74 from an aneurysm in the brain.  Denies associated numbness, tingling, weakness or other neurologic or systemic complaints.   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   Asymptomatic bacteriuria during pregnancy in first trimester 01/12/2021   Encounter for supervision of normal pregnancy, antepartum 02/02/2021   Rh negative state in antepartum period 02-02-2021   Death of husband 10-30-2019   History of gestational diabetes 10/24/2018   Tachycardia 04/20/2018   History of illicit drug use 01/27/2017   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   Abnormal Pap smear of cervix 05/20/2015   Depression with anxiety 05/13/2015   Hallucinations 05/13/2015   History of suicidal ideation 05/13/2015   History of alcohol abuse 03/18/2015   Home Medication(s) Prior to Admission medications   Medication  Sig Start Date End Date Taking? Authorizing Provider  acetaminophen (TYLENOL) 325 MG tablet Take 2 tablets (650 mg total) by mouth every 4 (four) hours as needed for up to 180 doses (for pain scale < 4). 07/11/21   Calvert Cantor, CNM  atorvastatin (LIPITOR) 20 MG tablet Take 20 mg by mouth at bedtime. 11/02/22   [provider]  Blood Pressure Monitor MISC For regular home bp monitoring during pregnancy February 02, 2021   Cheral Marker, CNM  clonazePAM (KLONOPIN) 1 MG tablet TAKE (1) TABLET BY MOUTH THREE TIMES DAILY 08/20/21   Cresenzo-Dishmon, Scarlette Calico, CNM  etonogestrel-ethinyl estradiol (NUVARING) 0.12-0.015 MG/24HR vaginal ring Insert vaginally and leave in place for 3 consecutive weeks, then remove for 1 week. 08/20/21   Cresenzo-Dishmon, Scarlette Calico, CNM  ibuprofen (ADVIL) 600 MG tablet Take 1 tablet (600 mg total) by mouth every 6 (six) hours as needed for cramping. 07/11/21   Calvert Cantor, CNM  omeprazole (PRILOSEC) 20 MG capsule TAKE 1 TABLET BY MOUTH ONCE DAILY. 12/30/21   Lazaro Arms, MD  Past Surgical History Past Surgical History:  Procedure Laterality Date   DILATION AND EVACUATION  11/28/2010   Family History Family History  Problem Relation Age of Onset   Cerebral palsy Mother    Hyperlipidemia Father    Hypertension Father    Cancer Father        skin   Mental illness Father    Diabetes Father    Other Brother        problems with gums   Cancer Maternal Grandmother    Mental illness Maternal Grandmother    Stroke Maternal Grandmother    Heart attack Paternal Grandmother    Down syndrome Cousin    Bipolar disorder Cousin     Social History Social History   Tobacco Use   Smoking status: Former    Current packs/day: 0.00    Types: Cigarettes    Quit date: 12/15/2020    Years since quitting: 2.0   Smokeless tobacco: Never   Vaping Use   Vaping status: Former  Substance Use Topics   Alcohol use: Yes    Alcohol/week: 12.0 standard drinks of alcohol    Types: 12 Cans of beer per week    Comment: During pregnancy, months ago. Malt liquour/liquor   Drug use: Not Currently    Types: Other-see comments, Marijuana, Cocaine, MDMA (Ecstacy)    Comment: none since April 2018   Allergies Metronidazole, Procardia xl [nifedipine er], Tramadol, Reglan [metoclopramide hcl], and Toradol [ketorolac tromethamine]  Review of Systems Review of Systems  Neurological:  Positive for headaches.    Physical Exam Vital Signs  I have reviewed the triage vital signs BP (!) 127/91   Pulse 96   Temp 98.1 F (36.7 C) (Oral)   Resp 18   Ht 5\' 4"  (1.626 m)   Wt 75 kg   SpO2 100%   BMI 28.38 kg/m   Physical Exam Vitals and nursing note reviewed.  Constitutional:      General: She is not in acute distress.    Appearance: She is well-developed.  HENT:     Head: Normocephalic and atraumatic.  Eyes:     Conjunctiva/sclera: Conjunctivae normal.  Cardiovascular:     Rate and Rhythm: Normal rate and regular rhythm.     Heart sounds: No murmur heard. Pulmonary:     Effort: Pulmonary effort is normal. No respiratory distress.     Breath sounds: Normal breath sounds.  Abdominal:     Palpations: Abdomen is soft.     Tenderness: There is no abdominal tenderness.  Musculoskeletal:        General: No swelling.     Cervical back: Neck supple.  Skin:    General: Skin is warm and dry.     Capillary Refill: Capillary refill takes less than 2 seconds.  Neurological:     General: No focal deficit present.     Mental Status: She is alert.     Cranial Nerves: No cranial nerve deficit.     Sensory: No sensory deficit.     Motor: No weakness.  Psychiatric:        Mood and Affect: Mood normal.     ED Results and Treatments Labs (all labs ordered are listed, but only abnormal results are displayed) Labs Reviewed  I-STAT  CHEM 8, ED  I-STAT CHEM 8, ED  Radiology CT ANGIO HEAD NECK W WO CM  Result Date: 01/04/2023 CLINICAL DATA:  Headache, dizziness, carotid artery dissection suspected EXAM: CT ANGIOGRAPHY HEAD AND NECK WITH AND WITHOUT CONTRAST TECHNIQUE: Multidetector CT imaging of the head and neck was performed using the standard protocol during bolus administration of intravenous contrast. Multiplanar CT image reconstructions and MIPs were obtained to evaluate the vascular anatomy. Carotid stenosis measurements (when applicable) are obtained utilizing NASCET criteria, using the distal internal carotid diameter as the denominator. RADIATION DOSE REDUCTION: This exam was performed according to the departmental dose-optimization program which includes automated exposure control, adjustment of the mA and/or kV according to patient size and/or use of iterative reconstruction technique. CONTRAST:  75mL OMNIPAQUE IOHEXOL 350 MG/ML SOLN COMPARISON:  None Available. FINDINGS: CT HEAD FINDINGS Brain: No evidence of acute infarct, hemorrhage, mass, mass effect, or midline shift. No hydrocephalus or extra-axial fluid collection. Vascular: No hyperdense vessel. Skull: Negative for fracture or focal lesion. Sinuses/Orbits: No acute finding. Other: The mastoid air cells are well aerated. CTA NECK FINDINGS Aortic arch: Two-vessel arch with a common origin of the brachiocephalic and left common carotid arteries. Imaged portion shows no evidence of aneurysm or dissection. No significant stenosis of the major arch vessel origins. Right carotid system: No evidence of stenosis, dissection, or occlusion. Left carotid system: No evidence of stenosis, dissection, or occlusion. Vertebral arteries: No evidence of stenosis, dissection, or occlusion. Skeleton: No acute osseous abnormality. Other neck: No acute finding. Upper chest:  No focal pulmonary opacity or pleural effusion. Review of the MIP images confirms the above findings CTA HEAD FINDINGS Anterior circulation: Both internal carotid arteries are patent to the termini, without significant stenosis. A1 segments patent. Normal anterior communicating artery. Anterior cerebral arteries are patent to their distal aspects without significant stenosis. No M1 stenosis or occlusion. MCA branches perfused to their distal aspects without significant stenosis. Posterior circulation: Vertebral arteries patent to the vertebrobasilar junction without significant stenosis. Posterior inferior cerebellar arteries patent proximally. Basilar patent to its distal aspect without significant stenosis. Superior cerebellar arteries patent proximally. Patent right P1. Fetal origin of the left PCA from the left posterior communicating artery. The right posterior communicating artery is diminutive but patent. PCAs perfused to their distal aspects without significant stenosis. Venous sinuses: Well opacified, patent. Anatomic variants: Fetal origin of the left PCA. Review of the MIP images confirms the above findings IMPRESSION: 1. No acute intracranial process. 2. No intracranial large vessel occlusion or significant stenosis. 3. No hemodynamically significant stenosis in the neck. No evidence of dissection. Electronically Signed   By: Wiliam Ke M.D.   On: 01/04/2023 02:45    Pertinent labs & imaging results that were available during my care of the patient were reviewed by me and considered in my medical decision making (see MDM for details).  Medications Ordered in ED Medications  lactated ringers bolus 1,000 mL (0 mLs Intravenous Stopped 01/04/23 0256)  iohexol (OMNIPAQUE) 350 MG/ML injection 75 mL (75 mLs Intravenous Contrast Given 01/04/23 0224)  naproxen (NAPROSYN) tablet 500 mg (500 mg Oral Given 01/04/23 0316)  Procedures Procedures  (including critical care time)  Medical Decision Making / ED Course   This patient presents to the ED for concern of headache, this involves an extensive number of treatment options, and is a complaint that carries with it a high risk of complications and morbidity.  The differential diagnosis includes migraine, Cluster, Tension Ha, Dural venous thrombosis, Sinusitis, CO poisoning, HTN, Malignancy  MDM: Patient seen emerged part for evaluation of a headache.  Physical exam is unremarkable with no focal motor or sensory deficits.  No cranial nerve deficits.  Given strong family history, we did proceed with obtaining vessel imaging which was reassuringly unremarkable.  I initially did offer a headache cocktail for the patient but she states that the ingredients cause hallucinations.  She received 1 L of fluids and 500 of naproxen and symptoms resolved.  At this time, she does not meet inpatient criteria for admission and she is safe for discharge with outpatient follow-up.   Additional history obtained:  -External records from outside source obtained and reviewed including: Chart review including previous notes, labs, imaging, consultation notes   Lab Tests: -I ordered, reviewed, and interpreted labs.   The pertinent results include:   Labs Reviewed  I-STAT CHEM 8, ED  I-STAT CHEM 8, ED         Imaging Studies ordered: I ordered imaging studies including CT angio brain and neck I independently visualized and interpreted imaging. I agree with the radiologist interpretation   Medicines ordered and prescription drug management: Meds ordered this encounter  Medications   lactated ringers bolus 1,000 mL   DISCONTD: prochlorperazine (COMPAZINE) injection 10 mg   DISCONTD: diphenhydrAMINE (BENADRYL) injection 25 mg   iohexol (OMNIPAQUE) 350 MG/ML injection 75 mL   DISCONTD: ketorolac (TORADOL) 15 MG/ML injection  15 mg   naproxen (NAPROSYN) tablet 500 mg    -I have reviewed the patients home medicines and have made adjustments as needed  Critical interventions none   Cardiac Monitoring: The patient was maintained on a cardiac monitor.  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

## 2023-01-20 ENCOUNTER — Other Ambulatory Visit: Payer: Self-pay

## 2023-01-20 ENCOUNTER — Encounter (HOSPITAL_COMMUNITY): Payer: Self-pay | Admitting: Emergency Medicine

## 2023-01-20 ENCOUNTER — Emergency Department (HOSPITAL_COMMUNITY)
Admission: EM | Admit: 2023-01-20 | Discharge: 2023-01-20 | Disposition: A | Discharge status: Home / Self Care | Payer: MEDICAID | Attending: Emergency Medicine | Admitting: Emergency Medicine

## 2023-01-20 ENCOUNTER — Emergency Department (HOSPITAL_COMMUNITY): Payer: MEDICAID

## 2023-01-20 DIAGNOSIS — R102 Pelvic and perineal pain: Secondary | ICD-10-CM | POA: Diagnosis not present

## 2023-01-20 DIAGNOSIS — R1031 Right lower quadrant pain: Secondary | ICD-10-CM | POA: Diagnosis present

## 2023-01-20 LAB — COMPREHENSIVE METABOLIC PANEL
ALT: 23 U/L (ref 0–44)
AST: 19 U/L (ref 15–41)
Albumin: 4.8 g/dL (ref 3.5–5.0)
Alkaline Phosphatase: 53 U/L (ref 38–126)
Anion gap: 9 (ref 5–15)
BUN: 14 mg/dL (ref 6–20)
CO2: 22 mmol/L (ref 22–32)
Calcium: 9.4 mg/dL (ref 8.9–10.3)
Chloride: 103 mmol/L (ref 98–111)
Creatinine, Ser: 0.69 mg/dL (ref 0.44–1.00)
GFR, Estimated: >60 mL/min (ref >60)
Glucose, Bld: 95 mg/dL (ref 70–99)
Potassium: 3.9 mmol/L (ref 3.5–5.1)
Sodium: 134 mmol/L — ABNORMAL LOW (ref 135–145)
Total Bilirubin: 0.7 mg/dL (ref 0.3–1.2)
Total Protein: 8.3 g/dL — ABNORMAL HIGH (ref 6.5–8.1)

## 2023-01-20 LAB — URINALYSIS, ROUTINE W REFLEX MICROSCOPIC
Bilirubin Urine: NEGATIVE
Glucose, UA: NEGATIVE mg/dL
Ketones, ur: NEGATIVE mg/dL
Leukocytes,Ua: NEGATIVE
Nitrite: NEGATIVE
Protein, ur: NEGATIVE mg/dL
Specific Gravity, Urine: 1.008 (ref 1.005–1.030)
pH: 5 (ref 5.0–8.0)

## 2023-01-20 LAB — LIPASE, BLOOD: Lipase: 33 U/L (ref 11–51)

## 2023-01-20 LAB — CBC
HCT: 40.1 % (ref 36.0–46.0)
Hemoglobin: 13.2 g/dL (ref 12.0–15.0)
MCH: 29.3 pg (ref 26.0–34.0)
MCHC: 32.9 g/dL (ref 30.0–36.0)
MCV: 89.1 fL (ref 80.0–100.0)
Platelets: 290 10*3/uL (ref 150–400)
RBC: 4.5 MIL/uL (ref 3.87–5.11)
RDW: 13 % (ref 11.5–15.5)
WBC: 6 10*3/uL (ref 4.0–10.5)
nRBC: 0 % (ref 0.0–0.2)

## 2023-01-20 LAB — HCG, SERUM, QUALITATIVE: Preg, Serum: NEGATIVE

## 2023-01-20 LAB — HCG, QUANTITATIVE, PREGNANCY: hCG, Beta Chain, Quant, S: <1 m[IU]/mL (ref <5)

## 2023-01-20 MED ORDER — DOXYCYCLINE HYCLATE 100 MG PO TABS
100.0000 mg | ORAL_TABLET | Freq: Once | ORAL | Status: AC
  Administered 2023-01-20: 100 mg via ORAL
  Filled 2023-01-20: qty 1

## 2023-01-20 MED ORDER — CEFTRIAXONE SODIUM 500 MG IJ SOLR
500.0000 mg | Freq: Once | INTRAMUSCULAR | Status: AC
  Administered 2023-01-20: 500 mg via INTRAMUSCULAR
  Filled 2023-01-20: qty 500

## 2023-01-20 MED ORDER — NAPROXEN 500 MG PO TABS: 500.0000 mg | ORAL_TABLET | Freq: Two times a day (BID) | ORAL | 0 refills | Status: DC

## 2023-01-20 MED ORDER — LIDOCAINE HCL (PF) 1 % IJ SOLN
INTRAMUSCULAR | Status: AC
  Administered 2023-01-20: 1 mL
  Filled 2023-01-20: qty 2

## 2023-01-20 MED ORDER — FENTANYL CITRATE PF 50 MCG/ML IJ SOSY: 50.0000 ug | PREFILLED_SYRINGE | Freq: Once | INTRAMUSCULAR | Status: DC

## 2023-01-20 MED ORDER — IOHEXOL 300 MG/ML  SOLN
100.0000 mL | Freq: Once | INTRAMUSCULAR | Status: AC | PRN
  Administered 2023-01-20: 100 mL via INTRAVENOUS

## 2023-01-20 MED ORDER — DOXYCYCLINE HYCLATE 100 MG PO CAPS: 100.0000 mg | ORAL_CAPSULE | Freq: Two times a day (BID) | ORAL | 0 refills | Status: DC

## 2023-01-20 NOTE — ED Provider Triage Note (Signed)
Emergency Medicine Provider Triage Evaluation Note  TONGIA MUTCHLER , a 32 y.o. female  was evaluated in triage.  Pt complains of right upper back pain and right lower quadrant pain x 3 days.  Is on antibiotics for bacterial vaginosis and yeast infection.  Went to her PCP today, states her urine was "clear" but she was having so much discomfort she decided to come to the ER.  Admits to nausea with no vomiting.  Just denies dysuria, denies fever or chills, no chest pain or shortness of breath.  No blood in the urine.  She states she was worried about appendicitis or an ectopic pregnancy.  Review of Systems  Positive: RLQ pain, R upper back pain Negative: Fever, vomitin  Physical Exam  BP 121/87 (BP Location: Right Arm)   Pulse 80   Temp 98.8 F (37.1 C) (Oral)   Resp 17   SpO2 100%  Gen:   Awake, no distress   Resp:  Normal effort  MSK:   Moves extremities without difficulty  Other:    Medical Decision Making  Medically screening exam initiated at 1:39 PM.  Appropriate orders placed.  Glynis R Nieuwenhuis was informed that the remainder of the evaluation will be completed by another provider, this initial triage assessment does not replace that evaluation, and the importance of remaining in the ED until their evaluation is complete.     Ma Rings, [place] 01/20/23 1341

## 2023-01-20 NOTE — ED Triage Notes (Signed)
Pt reports right sided abdominal pain x 3 days. Pt reports the pain radiates to her flank and back. Pt reports being treated for BV and fungal infection.

## 2023-01-20 NOTE — Discharge Instructions (Signed)
Pleasure taking care of you today.  You were seen for right sided pelvic pain.  Your workup was very reassuring, your labs were normal, you are not pregnant, your CT scan did not show any significant abnormalities except for an incidental kidney stone on the left side.  We are giving you antibiotics to treat for gonorrhea and chlamydia.  Testing was already done with your primary care doctor today so we did not send further swabs.  Continue taking the MetroGel for your BV.  You can take the naproxen as needed for pain.  You develop worsening pain, fever, vomiting or other worrisome changes come back to the ER right away

## 2023-01-20 NOTE — ED Provider Notes (Addendum)
[place] EMERGENCY DEPARTMENT AT [facility] Provider Note   CSN: [id number] Arrival date & time: 01/20/23  1249     History  Chief Complaint  Patient presents with   Abdominal Pain    Patricia Richards is a 32 y.o. female.  She has PMH of anxiety and depression, past history of gestational diabetes.  Presents to ER complaining of right lower quadrant abdominal pain and right upper back pain, no urinary symptoms, no fevers or chills.  No hematuria.  She has had nausea but no vomiting.  States today is the third day for this symptoms.  Went to her PCP today and decided to come here because the pain was so severe.  She reports she is on antibiotics already for vaginosis and a yeast infection.  Denies vaginal discharge, states she is worried about possible appendicitis or ectopic pregnancy.   Abdominal Pain      Home Medications Prior to Admission medications   Medication Sig Start Date End Date Taking? Authorizing Provider  doxycycline (VIBRAMYCIN) 100 MG capsule Take 1 capsule (100 mg total) by mouth 2 (two) times daily. 01/20/23  Yes Jahyra Sukup A, PA-C  naproxen (NAPROSYN) 500 MG tablet Take 1 tablet (500 mg total) by mouth 2 (two) times daily. 01/20/23  Yes Terita Hejl A, PA-C  acetaminophen (TYLENOL) 325 MG tablet Take 2 tablets (650 mg total) by mouth every 4 (four) hours as needed for up to 180 doses (for pain scale < 4). 07/11/21   Calvert Cantor, CNM  atorvastatin (LIPITOR) 20 MG tablet Take 20 mg by mouth at bedtime. 11/02/22   [provider]  Blood Pressure Monitor MISC For regular home bp monitoring during pregnancy 01/05/21   Cheral Marker, CNM  clonazePAM (KLONOPIN) 1 MG tablet TAKE (1) TABLET BY MOUTH THREE TIMES DAILY 08/20/21   Cresenzo-Dishmon, Scarlette Calico, CNM  etonogestrel-ethinyl estradiol (NUVARING) 0.12-0.015 MG/24HR vaginal ring Insert vaginally and leave in place for 3 consecutive weeks, then remove for 1 week. 08/20/21   Cresenzo-Dishmon,  Scarlette Calico, CNM  ibuprofen (ADVIL) 600 MG tablet Take 1 tablet (600 mg total) by mouth every 6 (six) hours as needed for cramping. 07/11/21   Calvert Cantor, CNM  omeprazole (PRILOSEC) 20 MG capsule TAKE 1 TABLET BY MOUTH ONCE DAILY. 12/30/21   Lazaro Arms, MD      Allergies    Metronidazole, Procardia xl [nifedipine er], Tramadol, Reglan [metoclopramide hcl], and Toradol [ketorolac tromethamine]    Review of Systems   Review of Systems  Gastrointestinal:  Positive for abdominal pain.    Physical Exam Updated Vital Signs BP 121/87 (BP Location: Right Arm)   Pulse 80   Temp 98.8 F (37.1 C) (Oral)   Resp 17   SpO2 100%  Physical Exam Vitals and nursing note reviewed. Exam conducted with a chaperone present.  Constitutional:      General: She is not in acute distress.    Appearance: She is well-developed.  HENT:     Head: Normocephalic and atraumatic.  Eyes:     Conjunctiva/sclera: Conjunctivae normal.  Cardiovascular:     Rate and Rhythm: Normal rate and regular rhythm.     Heart sounds: No murmur heard. Pulmonary:     Effort: Pulmonary effort is normal. No respiratory distress.     Breath sounds: Normal breath sounds.  Abdominal:     General: Abdomen is flat.     Palpations: Abdomen is soft.     Tenderness: There is abdominal tenderness  in the right lower quadrant. There is no guarding or rebound.  Genitourinary:    General: Normal vulva.     Labia:        Right: No rash.      Vagina: Normal.     Cervix: Normal. No cervical motion tenderness.     Uterus: Normal.      Adnexa: Left adnexa normal.       Right: Tenderness present. No mass or fullness.    Musculoskeletal:        General: No swelling.     Cervical back: Neck supple.  Skin:    General: Skin is warm and dry.     Capillary Refill: Capillary refill takes less than 2 seconds.  Neurological:     General: No focal deficit present.     Mental Status: She is alert and oriented to person, place, and time.   Psychiatric:        Mood and Affect: Mood normal.     ED Results / Procedures / Treatments   Labs (all labs ordered are listed, but only abnormal results are displayed) Labs Reviewed  COMPREHENSIVE METABOLIC PANEL - Abnormal; Notable for the following components:      Result Value   Sodium 134 (*)    Total Protein 8.3 (*)    All other components within normal limits  URINALYSIS, ROUTINE W REFLEX MICROSCOPIC - Abnormal; Notable for the following components:   Color, Urine STRAW (*)    Hgb urine dipstick SMALL (*)    Bacteria, UA RARE (*)    All other components within normal limits  LIPASE, BLOOD  CBC  HCG, SERUM, QUALITATIVE  HCG, QUANTITATIVE, PREGNANCY    EKG None  Radiology CT ABDOMEN PELVIS W CONTRAST  Result Date: 01/20/2023 CLINICAL DATA:  RLQ abdominal pain EXAM: CT ABDOMEN AND PELVIS WITH CONTRAST TECHNIQUE: Multidetector CT imaging of the abdomen and pelvis was performed using the standard protocol following bolus administration of intravenous contrast. RADIATION DOSE REDUCTION: This exam was performed according to the departmental dose-optimization program which includes automated exposure control, adjustment of the mA and/or kV according to patient size and/or use of iterative reconstruction technique. CONTRAST:  OMNIPAQUE IOHEXOL 300 MG/ML  SOLN COMPARISON:  None Available. FINDINGS: Lower chest: No acute abnormality. Hepatobiliary: No focal liver abnormality. No gallstones, gallbladder wall thickening, or pericholecystic fluid. No biliary dilatation. Pancreas: No focal lesion. Normal pancreatic contour. No surrounding inflammatory changes. No main pancreatic ductal dilatation. Spleen: Normal in size without focal abnormality. Adrenals/Urinary Tract: No adrenal nodule bilaterally. Bilateral kidneys enhance symmetrically. No hydronephrosis. No hydroureter. Left nephrolithiasis measuring up to 2 mm. No right nephrolithiasis. No ureterolithiasis bilaterally. The urinary  bladder is unremarkable. Stomach/Bowel: Stomach is within normal limits. No evidence of bowel wall thickening or dilatation. Appendix appears normal. Vascular/Lymphatic: No abdominal aorta or iliac aneurysm. No abdominal, pelvic, or inguinal lymphadenopathy. Reproductive: Uterus and bilateral adnexa are unremarkable. Other: No intraperitoneal free fluid. No intraperitoneal free gas. No organized fluid collection. Musculoskeletal: No abdominal wall hernia or abnormality.  Diastasis rectus. No suspicious lytic or blastic osseous lesions. No acute displaced fracture. Multilevel degenerative changes of the spine. IMPRESSION: 1. Nonobstructive left nephrolithiasis measuring up to 2 mm. 2. Otherwise no acute intra-abdominal or intrapelvic abnormality. Electronically Signed   By: Tish Frederickson M.D.   On: 01/20/2023 19:05    Procedures Procedures    Medications Ordered in ED Medications  fentaNYL (SUBLIMAZE) injection 50 mcg (50 mcg Intravenous Patient Refused/Not Given 01/20/23 1554)  cefTRIAXone (ROCEPHIN) injection 500 mg (has no administration in time range)  doxycycline (VIBRA-TABS) tablet 100 mg (has no administration in time range)  iohexol (OMNIPAQUE) 300 MG/ML solution 100 mL (100 mLs Intravenous Contrast Given 01/20/23 1700)    ED Course/ Medical Decision Making/ A&P Clinical Course as of 01/20/23 1929  Thu Jan 20, 2023  1902 On CT scan, patient comfortable walking around in no distress.  Updated that labs are reassuring, she is not pregnant. [CB]    Clinical Course User Index [CB] Ma Rings, PA-C                                 Medical Decision Making Ddx: Appendicitis, ovarian torsion, ovarian cyst, ectopic pregnancy, UTI, ureterolithiasis, biliary colic other  ED course: Patient here for right lower quadrant abdominal pain, also having some right upper back pain.  No fevers or chills, reassuring vitals and labs.  CT shows nonobstructive left nephrolithiasis, no other  intracranial findings.  No fevers or chills.  No vomiting while in the ED.  She is well-appearing, walking around with no complaints.  Patient still having some tenderness, I discussed with her her visit today when she was diagnosed with yeast infection and BV.  They did swabs but did not do pelvic exam.  They tested for GC and chlamydia but did not want to treat her empirically.  She is sexually active, recently had 2 partners in the past couple of weeks.  I discussed with her feel that this is high enough risk that is certainly warrants empiric treatment as she is having some pain.  Her exam did not have any adnexal fullness and no cervical motion tenderness so I do not feel she has PID but do feel is reasonable to treat her empirically for GC and chlamydia.  Swabs not sent today because they were already sent as outpatient.  Patient was agreeable with this.  Amount and/or Complexity of Data Reviewed Labs: ordered. Radiology: ordered.  Risk Prescription drug management.           Final Clinical Impression(s) / ED Diagnoses Final diagnoses:  Pelvic pain in female    Rx / DC Orders ED Discharge Orders          Ordered    doxycycline (VIBRAMYCIN) 100 MG capsule  2 times daily        01/20/23 1924    naproxen (NAPROSYN) 500 MG tablet  2 times daily        01/20/23 1924              Josem Kaufmann 01/20/23 1912    Josem Kaufmann 01/20/23 1929    Glyn Ade, MD 01/20/23 1948

## 2023-02-03 ENCOUNTER — Encounter: Payer: Self-pay | Admitting: Internal Medicine

## 2023-03-22 LAB — CYTOLOGY - PAP: CYTOLOGY - PAP: NEGATIVE

## 2023-04-20 ENCOUNTER — Emergency Department (HOSPITAL_COMMUNITY): Payer: MEDICAID

## 2023-04-20 ENCOUNTER — Other Ambulatory Visit: Payer: Self-pay

## 2023-04-20 ENCOUNTER — Emergency Department (HOSPITAL_COMMUNITY)
Admission: EM | Admit: 2023-04-20 | Discharge: 2023-04-20 | Disposition: A | Discharge status: Home / Self Care | Payer: MEDICAID | Attending: Emergency Medicine | Admitting: Emergency Medicine

## 2023-04-20 DIAGNOSIS — R079 Chest pain, unspecified: Secondary | ICD-10-CM | POA: Insufficient documentation

## 2023-04-20 DIAGNOSIS — K0889 Other specified disorders of teeth and supporting structures: Secondary | ICD-10-CM

## 2023-04-20 DIAGNOSIS — K029 Dental caries, unspecified: Secondary | ICD-10-CM | POA: Insufficient documentation

## 2023-04-20 LAB — COMPREHENSIVE METABOLIC PANEL
ALT: 22 U/L (ref 0–44)
AST: 20 U/L (ref 15–41)
Albumin: 4.5 g/dL (ref 3.5–5.0)
Alkaline Phosphatase: 58 U/L (ref 38–126)
Anion gap: 9 (ref 5–15)
BUN: 14 mg/dL (ref 6–20)
CO2: 27 mmol/L (ref 22–32)
Calcium: 9.9 mg/dL (ref 8.9–10.3)
Chloride: 102 mmol/L (ref 98–111)
Creatinine, Ser: 0.68 mg/dL (ref 0.44–1.00)
GFR, Estimated: >60 mL/min (ref >60)
Glucose, Bld: 92 mg/dL (ref 70–99)
Potassium: 3.7 mmol/L (ref 3.5–5.1)
Sodium: 138 mmol/L (ref 135–145)
Total Bilirubin: 0.3 mg/dL (ref <1.2)
Total Protein: 7.9 g/dL (ref 6.5–8.1)

## 2023-04-20 LAB — LACTIC ACID, PLASMA: Lactic Acid, Venous: 1.2 mmol/L (ref 0.5–1.9)

## 2023-04-20 LAB — CBC WITH DIFFERENTIAL/PLATELET
Abs Immature Granulocytes: 0.01 10*3/uL (ref 0.00–0.07)
Basophils Absolute: 0 10*3/uL (ref 0.0–0.1)
Basophils Relative: 0 %
Eosinophils Absolute: 0 10*3/uL (ref 0.0–0.5)
Eosinophils Relative: 1 %
HCT: 41 % (ref 36.0–46.0)
Hemoglobin: 13.7 g/dL (ref 12.0–15.0)
Immature Granulocytes: 0 %
Lymphocytes Relative: 38 %
Lymphs Abs: 2.1 10*3/uL (ref 0.7–4.0)
MCH: 30.3 pg (ref 26.0–34.0)
MCHC: 33.4 g/dL (ref 30.0–36.0)
MCV: 90.7 fL (ref 80.0–100.0)
Monocytes Absolute: 0.3 10*3/uL (ref 0.1–1.0)
Monocytes Relative: 6 %
Neutro Abs: 3 10*3/uL (ref 1.7–7.7)
Neutrophils Relative %: 55 %
Platelets: 271 10*3/uL (ref 150–400)
RBC: 4.52 MIL/uL (ref 3.87–5.11)
RDW: 12.4 % (ref 11.5–15.5)
WBC: 5.5 10*3/uL (ref 4.0–10.5)
nRBC: 0 % (ref 0.0–0.2)

## 2023-04-20 LAB — TROPONIN I (HIGH SENSITIVITY)
Troponin I (High Sensitivity): 2 ng/L (ref <18)
Troponin I (High Sensitivity): <2 ng/L (ref <18)

## 2023-04-20 LAB — HCG, QUANTITATIVE, PREGNANCY: hCG, Beta Chain, Quant, S: 1 m[IU]/mL (ref <5)

## 2023-04-20 MED ORDER — BENZOCAINE 10 % MT GEL: 1.0000 | OROMUCOSAL | 0 refills | Status: DC | PRN

## 2023-04-20 MED ORDER — BENZOCAINE 10 % MT GEL
Freq: Once | OROMUCOSAL | Status: AC
  Filled 2023-04-20: qty 9

## 2023-04-20 MED ORDER — MECLIZINE HCL 12.5 MG PO TABS
25.0000 mg | ORAL_TABLET | Freq: Once | ORAL | Status: AC
  Administered 2023-04-20: 25 mg via ORAL
  Filled 2023-04-20: qty 2

## 2023-04-20 MED ORDER — IBUPROFEN 800 MG PO TABS
800.0000 mg | ORAL_TABLET | Freq: Once | ORAL | Status: AC
  Administered 2023-04-20: 800 mg via ORAL
  Filled 2023-04-20: qty 1

## 2023-04-20 MED ORDER — SODIUM CHLORIDE 0.9 % IV SOLN
3.0000 g | Freq: Once | INTRAVENOUS | Status: AC
  Administered 2023-04-20: 3 g via INTRAVENOUS
  Filled 2023-04-20: qty 8

## 2023-04-20 MED ORDER — AMOXICILLIN-POT CLAVULANATE 875-125 MG PO TABS
1.0000 | ORAL_TABLET | Freq: Two times a day (BID) | ORAL | 0 refills | Status: DC
Start: 2023-04-20 — End: 2023-07-03

## 2023-04-20 NOTE — ED Triage Notes (Signed)
Pt arrived [facility] for c/o left jaw pain from abscess x 1 week and left side chest pain x 2 days. No n/v noted. Pt has c/o dizziness at times.

## 2023-04-20 NOTE — Discharge Instructions (Addendum)
Your blood work today was reassuring.  Your heart is reassuring, there is no elevation of your heart enzymes.  I do think you have a dental infection, I have stopped you off the amoxicillin and started you on Augmentin, for your dental infection.  You should follow-up with your primary care doctor, and your dentist for this.  I provided you with information for primary care doctors, and a another dentist, if you choose to look elsewhere.  Return to the ER if you have any fevers, chills, swelling of the face, difficulty with speech or opening your mouth.

## 2023-04-20 NOTE — ED Provider Notes (Cosign Needed Addendum)
[place] EMERGENCY DEPARTMENT AT [facility] Provider Note   CSN: [id number] Arrival date & time: 04/20/23  1722     History  Chief Complaint  Patient presents with   Chest Pain    Patricia Richards is a 32 y.o. female, history of bipolar 1 disorder, who presents to the ED secondary to left jaw pain, as well as chest pain, it has been going on for the last couple days.  She states that she initially had some right jaw pain, that now has progressed to her left jaw pain.  Is currently being treated for a abscess, in her sinuses, by her dentist, and is on amoxicillin.  States the pain is not getting better, and that they cannot do a root canal, and till March.  She states that the pain has now gone to the left side of her face, but denies any increased welling, redness, fever, chills.  Also states that she is now having some chest pain that feels dull and achy, and was told by her dentist to come to the ER.      Home Medications Prior to Admission medications   Medication Sig Start Date End Date Taking? Authorizing Provider  acetaminophen (TYLENOL) 325 MG tablet Take 2 tablets (650 mg total) by mouth every 4 (four) hours as needed for up to 180 doses (for pain scale < 4). 07/11/21  Yes Clayton Bibles C, CNM  amoxicillin-clavulanate (AUGMENTIN) 875-125 MG tablet Take 1 tablet by mouth every 12 (twelve) hours. 04/20/23  Yes Maysin Carstens L, PA  amphetamine-dextroamphetamine (ADDERALL XR) 10 MG 24 hr capsule Take 10 mg by mouth every morning. 04/05/23  Yes [provider]  benzocaine (ORAJEL) 10 % mucosal gel Use as directed 1 Application in the mouth or throat as needed for mouth pain. 04/20/23  Yes Renn Dirocco L, PA  citalopram (CELEXA) 40 MG tablet Take 40 mg by mouth daily. 02/28/23  Yes [provider]  clonazePAM (KLONOPIN) 1 MG tablet TAKE (1) TABLET BY MOUTH THREE TIMES DAILY 08/20/21  Yes Cresenzo-Dishmon, Scarlette Calico, CNM  ibuprofen (ADVIL) 600 MG tablet Take 1  tablet (600 mg total) by mouth every 6 (six) hours as needed for cramping. 07/11/21  Yes Clayton Bibles C, CNM  meclizine (ANTIVERT) 25 MG tablet Take 25 mg by mouth 3 (three) times daily as needed for dizziness.   Yes [provider]  omeprazole (PRILOSEC) 20 MG capsule TAKE 1 TABLET BY MOUTH ONCE DAILY. 12/30/21  Yes Lazaro Arms, MD  promethazine-dextromethorphan (PROMETHAZINE-DM) 6.25-15 MG/5ML syrup Take 5 mLs by mouth every 6 (six) hours as needed for cough. 03/28/23  Yes [provider]  Blood Pressure Monitor MISC For regular home bp monitoring during pregnancy 01/05/21   Cheral Marker, CNM      Allergies    Procardia xl [nifedipine er], Tramadol, Reglan [metoclopramide hcl], and Toradol [ketorolac tromethamine]    Review of Systems   Review of Systems  Respiratory:  Negative for shortness of breath.   Cardiovascular:  Positive for chest pain. Negative for leg swelling.    Physical Exam Updated Vital Signs BP 108/74   Pulse 81   Temp 98.1 F (36.7 C) (Oral)   Resp 15   Ht 5\' 3"  (1.6 m)   Wt 76.2 kg   SpO2 99%   BMI 29.76 kg/m  Physical Exam Vitals and nursing note reviewed.  Constitutional:      General: She is not in acute distress.    Appearance:  She is well-developed.  HENT:     Head: Normocephalic and atraumatic.     Mouth/Throat:      Comments: Dental caries, with root shown, above.  No orofacial erythema, or edema.  No palpable abscesses. Eyes:     Conjunctiva/sclera: Conjunctivae normal.  Cardiovascular:     Rate and Rhythm: Normal rate and regular rhythm.     Heart sounds: No murmur heard. Pulmonary:     Effort: Pulmonary effort is normal. No respiratory distress.     Breath sounds: Normal breath sounds.  Abdominal:     Palpations: Abdomen is soft.     Tenderness: There is no abdominal tenderness.  Musculoskeletal:        General: No swelling.     Cervical back: Neck supple.  Skin:    General: Skin is warm and dry.      Capillary Refill: Capillary refill takes less than 2 seconds.  Neurological:     Mental Status: She is alert.  Psychiatric:        Mood and Affect: Mood normal.     ED Results / Procedures / Treatments   Labs (all labs ordered are listed, but only abnormal results are displayed) Labs Reviewed  CULTURE, BLOOD (ROUTINE X 2)  CULTURE, BLOOD (ROUTINE X 2)  CBC WITH DIFFERENTIAL/PLATELET  COMPREHENSIVE METABOLIC PANEL  HCG, QUANTITATIVE, PREGNANCY  LACTIC ACID, PLASMA  TROPONIN I (HIGH SENSITIVITY)  TROPONIN I (HIGH SENSITIVITY)    EKG EKG Interpretation Date/Time:  Wednesday April 20 2023 17:43:51 EST Ventricular Rate:  83 PR Interval:  160 QRS Duration:  91 QT Interval:  372 QTC Calculation: 438 R Axis:   77  Text Interpretation: Sinus rhythm No significant change since last tracing Confirmed by Linwood Dibbles [id number]) on 04/20/2023 5:50:34 PM  Radiology DG Chest 2 View  Result Date: 04/20/2023 CLINICAL DATA:  Chest pain. Left jaw pain from abscess for 1 week. Left-sided chest pain for 2 days. EXAM: CHEST - 2 VIEW COMPARISON:  Chest radiographs 11/28/2022 FINDINGS: Cardiac silhouette and mediastinal contours are within limits. The lungs are clear. No pleural effusion pneumothorax. Mild dextrocurvature of the midthoracic spine is similar to prior. Bilateral nipple piercings are incidentally noted. IMPRESSION: No active cardiopulmonary disease. Electronically Signed   By: Neita Garnet M.D.   On: 04/20/2023 19:09    Procedures Procedures    Medications Ordered in ED Medications  ibuprofen (ADVIL) tablet 800 mg (800 mg Oral Given 04/20/23 1841)  meclizine (ANTIVERT) tablet 25 mg (25 mg Oral Given 04/20/23 1847)  benzocaine (ORAJEL) 10 % mucosal gel ( Mouth/Throat Given 04/20/23 1841)  Ampicillin-Sulbactam (UNASYN) 3 g in sodium chloride 0.9 % 100 mL IVPB (0 g Intravenous Stopped 04/20/23 1916)    ED Course/ Medical Decision Making/ A&P                                 Medical  Decision Making Patient is a 32 year old female, here for jaw pain, since she has had a dental infection, started on the right side of her face went to her left.  She has 2 caries present, that pair appear fairly severe but there is no abscess present.  There is no facial erythema, edema or warmth.  She is able to open her mouth completely.  Now she developed chest pain, and was told to come to the ER by her dentist.  Her chest pain is achy, in the middle of her  chest, status and occurred after her dentist told her to come to the ER if she develops chest pain.  She has not had any fevers or chills.  No IV drug use.  We obtain blood cultures, to rule out any kind of blood infection, as well as a lactic acid, and blood work and troponins.  She is PERC negative  Amount and/or Complexity of Data Reviewed Labs: ordered.    Details: No leukocytosis, no acute findings Radiology: ordered.    Details: Chest x-ray clear ECG/medicine tests:  Decision-making details documented in ED Course. Discussion of management or test interpretation with external provider(s): Troponins within normal limits chest x-ray is clear.  Will stop her amoxicillin and started on Augmentin, for the ear infection.  I do not see any overt abscesses that need to be drained.  She has no facial swelling thus no CT of her face was obtained.  She is well-appearing afebrile, nontachycardic.  We will have her follow-up with a dentist, for further evaluation, and her primary care doctor.  We discussed emergent return precautions such as fever, chills, worsening chest pain, and she voiced understanding. HEART 0  Risk OTC drugs. Prescription drug management.    Final Clinical Impression(s) / ED Diagnoses Final diagnoses:  Chest pain, unspecified type  Pain, dental    Rx / DC Orders ED Discharge Orders          Ordered    amoxicillin-clavulanate (AUGMENTIN) 875-125 MG tablet  Every 12 hours        04/20/23 2122    benzocaine (ORAJEL)  10 % mucosal gel  As needed        04/20/23 2122              Marylyn Appenzeller, Harley [place], PA 04/20/23 2131    Pete Pelt, [place] 04/20/23 2132    Linwood Dibbles, MD 04/21/23 [phone]

## 2023-04-25 LAB — CULTURE, BLOOD (ROUTINE X 2)
Culture: NO GROWTH
Culture: NO GROWTH
Special Requests: ADEQUATE
Special Requests: ADEQUATE

## 2023-05-18 NOTE — L&D Delivery Note (Addendum)
 [facility] Delivery Note  Patricia Richards is a 33 y.o. [id number] at [redacted]w[redacted]d. She was admitted for SOL.   ROM: 5h 65m with clear fluid GBS Status: positive: PCN Maximum Maternal Temperature: 98.42F  Labor Progress: Well controlled through contractions.  Delivery Date/Time: 03/30/2024 @ 1005 Delivery: Called to room and patient was involuntarily pushing in the birthing tub. Upon cervical exam, anterior cervical lip was felt. Patient changed positions to hands and knees and immediately felt increased pressure. Pt continued to push and SNM was able to see head begin to crown.  Head delivered LOA. No nuchal cord present. Attempted to deliver body in hands and knees but with tight spacing around tub and position of patient in water . CNM stepped in at this time and patient was turned over. Shoulder and body then immediately delivered in usual fashion. Infant with spontaneous cry, placed on mother's abdomen, dried and stimulated. Cord clamped x 2 after 5-minute delay, and cut by [name]. Patient then moved to bed to finish delivery of the placenta. Cord blood drawn. Placenta delivered spontaneously, intact, with 3-vessel cord. Fundus firm with massage and Pitocin . Labia, perineum, vagina, and cervix inspected, No laceration identified and excellent hemostasis and approximation noted.   Placenta: 1015 Complications: none Lacerations: No laceration identified  QBL: 189 Analgesia: birthing tub  Postpartum Planning [ X] transfer orders to MB [ ]  discharge summary started & shared [ ]  message to sent to schedule follow-up  [email or web address] ] lists updated  Infant: Female  APGARs 8/9  3980g  Conard Me, SNM, RNC-OB Student Nurse Midwife 03/30/2024 10:55 AM

## 2023-06-02 ENCOUNTER — Encounter (HOSPITAL_COMMUNITY): Payer: Self-pay

## 2023-06-02 ENCOUNTER — Other Ambulatory Visit: Payer: Self-pay

## 2023-06-02 ENCOUNTER — Emergency Department (HOSPITAL_COMMUNITY): Payer: MEDICAID

## 2023-06-02 ENCOUNTER — Emergency Department (HOSPITAL_COMMUNITY)
Admission: EM | Admit: 2023-06-02 | Discharge: 2023-06-03 | Disposition: A | Discharge status: Home / Self Care | Payer: MEDICAID | Attending: Emergency Medicine | Admitting: Emergency Medicine

## 2023-06-02 DIAGNOSIS — R059 Cough, unspecified: Secondary | ICD-10-CM | POA: Insufficient documentation

## 2023-06-02 DIAGNOSIS — R0602 Shortness of breath: Secondary | ICD-10-CM | POA: Diagnosis present

## 2023-06-02 DIAGNOSIS — R079 Chest pain, unspecified: Secondary | ICD-10-CM | POA: Insufficient documentation

## 2023-06-02 DIAGNOSIS — Z20822 Contact with and (suspected) exposure to covid-19: Secondary | ICD-10-CM | POA: Diagnosis not present

## 2023-06-02 DIAGNOSIS — M549 Dorsalgia, unspecified: Secondary | ICD-10-CM | POA: Insufficient documentation

## 2023-06-02 DIAGNOSIS — R0789 Other chest pain: Secondary | ICD-10-CM

## 2023-06-02 NOTE — ED Triage Notes (Signed)
Pt c/o SOB, back pain and chest pain. Was sick few weeks ago with respiratory symptoms. Few days ago she thinks it started to come back.

## 2023-06-02 NOTE — ED Provider Notes (Signed)
[place] EMERGENCY DEPARTMENT AT [facility] Provider Note   CSN: [id number] Arrival date & time: 06/02/23  2149     History  Chief Complaint  Patient presents with   Multiple Complaints    Patricia Richards is a 32 y.o. female.  Patient is a 33 year old female with past medical history of anxiety, depression.  Patient presenting today for evaluation of shortness of breath, chest pain, and back pain.  Symptoms began several days ago in the absence of any injury or trauma.  She does describe a dry cough, but denies fevers.  She denies any ill contacts.  Pain is located to the left upper chest and radiates into her back.  This is worse when she breathes or moves.  No leg swelling.  No alleviating factors.  The history is provided by the patient.       Home Medications Prior to Admission medications   Medication Sig Start Date End Date Taking? Authorizing Provider  acetaminophen (TYLENOL) 325 MG tablet Take 2 tablets (650 mg total) by mouth every 4 (four) hours as needed for up to 180 doses (for pain scale < 4). 07/11/21   Calvert Cantor, CNM  amoxicillin-clavulanate (AUGMENTIN) 875-125 MG tablet Take 1 tablet by mouth every 12 (twelve) hours. 04/20/23   Small, Brooke L, PA  amphetamine-dextroamphetamine (ADDERALL XR) 10 MG 24 hr capsule Take 10 mg by mouth every morning. 04/05/23   [provider]  benzocaine (ORAJEL) 10 % mucosal gel Use as directed 1 Application in the mouth or throat as needed for mouth pain. 04/20/23   Small, Brooke L, PA  Blood Pressure Monitor MISC For regular home bp monitoring during pregnancy 01/05/21   Cheral Marker, CNM  citalopram (CELEXA) 40 MG tablet Take 40 mg by mouth daily. 02/28/23   [provider]  clonazePAM (KLONOPIN) 1 MG tablet TAKE (1) TABLET BY MOUTH THREE TIMES DAILY 08/20/21   Cresenzo-Dishmon, Scarlette Calico, CNM  ibuprofen (ADVIL) 600 MG tablet Take 1 tablet (600 mg total) by mouth every 6 (six) hours as needed  for cramping. 07/11/21   Calvert Cantor, CNM  meclizine (ANTIVERT) 25 MG tablet Take 25 mg by mouth 3 (three) times daily as needed for dizziness.    [provider]  omeprazole (PRILOSEC) 20 MG capsule TAKE 1 TABLET BY MOUTH ONCE DAILY. 12/30/21   Lazaro Arms, MD  promethazine-dextromethorphan (PROMETHAZINE-DM) 6.25-15 MG/5ML syrup Take 5 mLs by mouth every 6 (six) hours as needed for cough. 03/28/23   [provider]      Allergies    Procardia xl [nifedipine er], Tramadol, Reglan [metoclopramide hcl], and Toradol [ketorolac tromethamine]    Review of Systems   Review of Systems  All other systems reviewed and are negative.   Physical Exam Updated Vital Signs BP 103/71   Pulse 81   Temp 97.7 F (36.5 C) (Oral)   Resp 14   SpO2 97%  Physical Exam Vitals and nursing note reviewed.  Constitutional:      General: She is not in acute distress.    Appearance: She is well-developed. She is not diaphoretic.  HENT:     Head: Normocephalic and atraumatic.  Cardiovascular:     Rate and Rhythm: Normal rate and regular rhythm.     Heart sounds: No murmur heard.    No friction rub. No gallop.  Pulmonary:     Effort: Pulmonary effort is normal. No respiratory distress.     Breath sounds: Normal breath  sounds. No wheezing.  Abdominal:     General: Bowel sounds are normal. There is no distension.     Palpations: Abdomen is soft.     Tenderness: There is no abdominal tenderness.  Musculoskeletal:        General: No swelling or tenderness. Normal range of motion.     Cervical back: Normal range of motion and neck supple.     Right lower leg: No edema.     Left lower leg: No edema.  Skin:    General: Skin is warm and dry.  Neurological:     General: No focal deficit present.     Mental Status: She is alert and oriented to person, place, and time.     ED Results / Procedures / Treatments   Labs (all labs ordered are listed, but only abnormal results are  displayed) Labs Reviewed  RESP PANEL BY RT-PCR (RSV, FLU A&B, COVID)  [id number]  BASIC METABOLIC PANEL  CBC WITH DIFFERENTIAL/PLATELET  D-DIMER, QUANTITATIVE  HCG, SERUM, QUALITATIVE  TROPONIN I (HIGH SENSITIVITY)    EKG None  Radiology No results found.  Procedures Procedures    Medications Ordered in ED Medications - No data to display  ED Course/ Medical Decision Making/ A&P  Patient is a 33 year old female with history of anxiety and depression presenting with shortness of breath, chest pain as described in the HPI.  Patient arrives here with stable vital signs and is afebrile.  Physical examination is basically unremarkable.  Laboratory studies obtained including CBC, metabolic panel, troponin, D-dimer, and serum pregnancy test.  All studies unremarkable including negative D-dimer, negative troponin, and negative pregnancy test.  Chest x-ray shows no acute process.  At this point, I highly suspect a musculoskeletal etiology to the patient's discomfort.  Her workup is unremarkable and with negative D-dimer, I highly doubt PE.  I have also considered but highly doubt aortic dissection and feel as though patient can safely be discharged.   Final Clinical Impression(s) / ED Diagnoses Final diagnoses:  None    Rx / DC Orders ED Discharge Orders     None         Geoffery Lyons, MD 06/03/23 [phone]

## 2023-06-03 LAB — CBC WITH DIFFERENTIAL/PLATELET
Abs Immature Granulocytes: 0.02 10*3/uL (ref 0.00–0.07)
Basophils Absolute: 0 10*3/uL (ref 0.0–0.1)
Basophils Relative: 0 %
Eosinophils Absolute: 0.1 10*3/uL (ref 0.0–0.5)
Eosinophils Relative: 1 %
HCT: 39 % (ref 36.0–46.0)
Hemoglobin: 12.6 g/dL (ref 12.0–15.0)
Immature Granulocytes: 0 %
Lymphocytes Relative: 34 %
Lymphs Abs: 2.5 10*3/uL (ref 0.7–4.0)
MCH: 29.4 pg (ref 26.0–34.0)
MCHC: 32.3 g/dL (ref 30.0–36.0)
MCV: 90.9 fL (ref 80.0–100.0)
Monocytes Absolute: 0.5 10*3/uL (ref 0.1–1.0)
Monocytes Relative: 7 %
Neutro Abs: 4.2 10*3/uL (ref 1.7–7.7)
Neutrophils Relative %: 58 %
Platelets: 249 10*3/uL (ref 150–400)
RBC: 4.29 MIL/uL (ref 3.87–5.11)
RDW: 12.7 % (ref 11.5–15.5)
WBC: 7.2 10*3/uL (ref 4.0–10.5)
nRBC: 0 % (ref 0.0–0.2)

## 2023-06-03 LAB — BASIC METABOLIC PANEL
Anion gap: 8 (ref 5–15)
BUN: 13 mg/dL (ref 6–20)
CO2: 24 mmol/L (ref 22–32)
Calcium: 9.5 mg/dL (ref 8.9–10.3)
Chloride: 104 mmol/L (ref 98–111)
Creatinine, Ser: 0.6 mg/dL (ref 0.44–1.00)
GFR, Estimated: >60 mL/min (ref >60)
Glucose, Bld: 92 mg/dL (ref 70–99)
Potassium: 3.6 mmol/L (ref 3.5–5.1)
Sodium: 136 mmol/L (ref 135–145)

## 2023-06-03 LAB — TROPONIN I (HIGH SENSITIVITY): Troponin I (High Sensitivity): <2 ng/L (ref <18)

## 2023-06-03 LAB — RESP PANEL BY RT-PCR (RSV, FLU A&B, COVID)  RVPGX2
Influenza A by PCR: NEGATIVE
Influenza B by PCR: NEGATIVE
Resp Syncytial Virus by PCR: NEGATIVE
SARS Coronavirus 2 by RT PCR: NEGATIVE

## 2023-06-03 LAB — HCG, SERUM, QUALITATIVE: Preg, Serum: NEGATIVE

## 2023-06-03 LAB — D-DIMER, QUANTITATIVE: D-Dimer, Quant: <0.27 ug{FEU}/mL (ref 0.00–0.50)

## 2023-06-03 NOTE — Discharge Instructions (Signed)
Take ibuprofen 600 mg every 6 hours as needed for pain.  Rest.  Follow-up with primary doctor if not improving in the next week, and return to the ER if your symptoms significantly worsen or change.

## 2023-07-03 ENCOUNTER — Other Ambulatory Visit: Payer: Self-pay

## 2023-07-03 ENCOUNTER — Encounter (HOSPITAL_COMMUNITY): Payer: Self-pay | Admitting: *Deleted

## 2023-07-03 ENCOUNTER — Emergency Department (HOSPITAL_COMMUNITY)
Admission: EM | Admit: 2023-07-03 | Discharge: 2023-07-03 | Disposition: A | Discharge status: Home / Self Care | Payer: MEDICAID | Attending: Emergency Medicine | Admitting: Emergency Medicine

## 2023-07-03 DIAGNOSIS — K0889 Other specified disorders of teeth and supporting structures: Secondary | ICD-10-CM | POA: Insufficient documentation

## 2023-07-03 MED ORDER — OXYCODONE HCL 5 MG PO TABS: 5.0000 mg | ORAL_TABLET | ORAL | 0 refills | Status: AC | PRN

## 2023-07-03 MED ORDER — ONDANSETRON 4 MG PO TBDP: 4.0000 mg | ORAL_TABLET | Freq: Three times a day (TID) | ORAL | 0 refills | Status: DC | PRN

## 2023-07-03 MED ORDER — AMOXICILLIN 500 MG PO CAPS: 500.0000 mg | ORAL_CAPSULE | Freq: Three times a day (TID) | ORAL | 0 refills | Status: DC

## 2023-07-03 NOTE — Discharge Instructions (Signed)
Thank you for coming to [facility] Emergency Department. You were seen for dental pain and possibly dental infection.  Since he began having ringing in the ears after taking clindamycin, please discontinue this and take amoxicillin with Zofran. Please follow up with your dentist within 1 week.  Do not hesitate to return to the ED or call 911 if you experience: -Worsening symptoms -Facial or neck swelling -Difficulty breathing -Lightheadedness, passing out -Fevers/chills -Anything else that concerns you

## 2023-07-03 NOTE — ED Triage Notes (Signed)
Pt seen her dentist this past week for dental pain, not able to take antibiotic due to N/V.  Ears bilaterally are ringing, left facial pain. Pt with three bad teeth that need to be removed. Appt on 2/25 for removal.

## 2023-07-03 NOTE — ED Provider Notes (Signed)
 [place] EMERGENCY DEPARTMENT AT [facility] Provider Note   CSN: [id number] Arrival date & time: 07/03/23  1321     History  No chief complaint on file.   Patricia Richards is a 33 y.o. female with PMH as listed below who presents with left lower dental pain, headache, anxiety.  Patient has had problems with her left lower teeth. For several months now.  Saw her dentist because she was concerned she may have cracked one of her teeth and was prescribed Augmentin.  Could not tolerate this due to nausea and vomiting.  Was prescribed clindamycin, but read online that it has issues with GI side effects and so stopped taking it.  She has been taking Tylenol and ibuprofen, not taking any aspirin.  She states he has an appointment on 2/25 for removal of her teeth but was very concerned that she may have sepsis and so came to the hospital. Also endorses other intermittent symptoms such as headache, ringing in her ears, facial pain, and breathing problems right after she takes the clindamycin.   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 Authorizing Provider  amoxicillin (AMOXIL) 500 MG capsule Take 1 capsule (500 mg total) by mouth 3 (three) times daily. 07/03/23  Yes Loetta Rough, MD  ondansetron (ZOFRAN-ODT) 4 MG disintegrating tablet Take 1 tablet (4 mg total) by mouth every 8 (eight) hours as needed. 07/03/23  Yes Loetta Rough, MD  acetaminophen (TYLENOL) 325 MG tablet Take 2 tablets (650 mg total) by mouth every 4 (four) hours  as needed for up to 180 doses (for pain scale < 4). 07/11/21   Calvert Cantor, CNM  amphetamine-dextroamphetamine (ADDERALL XR) 10 MG 24 hr capsule Take 10 mg by mouth every morning. 04/05/23   [provider]  benzocaine (ORAJEL) 10 % mucosal gel Use as directed 1 Application in the mouth or throat as needed for mouth pain. 04/20/23   Small, Brooke L, PA  Blood Pressure Monitor MISC For regular home bp monitoring during pregnancy 01/05/21   Cheral Marker, CNM  citalopram (CELEXA) 40 MG tablet Take 40 mg by mouth daily. 02/28/23   [provider]  clonazePAM (KLONOPIN) 1 MG tablet TAKE (1) TABLET BY MOUTH THREE TIMES DAILY 08/20/21   Cresenzo-Dishmon, Scarlette Calico, CNM  ibuprofen (ADVIL) 600 MG tablet Take 1 tablet (600 mg total) by mouth every 6 (six) hours as needed for cramping. 07/11/21   Calvert Cantor, CNM  meclizine (ANTIVERT) 25 MG tablet Take 25 mg by mouth 3 (three) times daily as needed for dizziness.    [provider]  omeprazole (PRILOSEC) 20 MG capsule TAKE 1 TABLET BY MOUTH ONCE DAILY. 12/30/21   Lazaro Arms, MD  promethazine-dextromethorphan (PROMETHAZINE-DM) 6.25-15 MG/5ML syrup Take 5 mLs by mouth every 6 (six) hours as needed for cough. 03/28/23   [provider]      Allergies    Procardia xl [nifedipine er], Tramadol, Reglan [metoclopramide hcl], and Toradol [ketorolac tromethamine]    Review of Systems   Review of Systems A 10 point review of systems was performed and is negative unless otherwise reported in HPI.  Physical Exam Updated Vital Signs BP 116/82   Pulse 76   Temp 97.6 F (36.4 C) (Oral)   Resp 18   Ht 5\' 3"  (1.6 m)   Wt 77.1 kg   LMP 06/17/2023 (Exact Date)   SpO2 98%   BMI 30.11 kg/m  Physical Exam General: Normal appearing female, lying in bed.  HEENT: PERRLA, EOMI, no periorbital edema, Sclera anicteric. No facial swelling. MMM, trachea midline. Poor dentition, with TTP to lower teeth #18-20. No  observed dental fractures or trauma. No bleeding. No succinct swelling, erythema, induration, or fluctuance noted. No submandibular swelling. Clear oropharynx. Cardiology: RRR, no murmurs/rubs/gallops.  Resp: Normal respiratory rate and effort. CTAB, no wheezes, rhonchi, crackles. No stridor.  Abd: Soft, non-tender, non-distended. No rebound tenderness or guarding.  GU: Deferred. MSK: No peripheral edema or signs of trauma. Skin: warm, dry.  Neuro: A&Ox4, CNs II-XII grossly intact. MAEs. Sensation grossly intact.  Psych: Anxious mood and affect.   ED Results / Procedures / Treatments   Labs (all labs ordered are listed, but only abnormal results are displayed) Labs Reviewed - No data to display  EKG None  Radiology No results found.  Procedures Procedures    Medications Ordered in ED Medications - No data to display  ED Course/ Medical Decision Making/ A&P                          Medical Decision Making Risk Prescription drug management.    This patient presents to the ED for concern of dental pain/headache, this involves an extensive number of treatment options, and is a complaint that carries with it a high risk of complications and morbidity.  I considered the following differential and admission for this acute, potentially life threatening condition.   MDM:    No e/o facial abscess or drainable dental abscess on exam. No submandibular or oropharyngeal swelling on exam, no c/f ludwig's angina. Patient was very concerned she may have sepsis, but she is afebrile, not tachycardic, and she is very well-appearing on exam, and I reassured her that she does not have sepsis. I suspect that her headache is related to dental pain. She has reported intolerance of both augmentin/clindamycin provided by her dentist. For patient's tinnitus, she has clear TMs bilaterally and she does not take goody powder or aspirin, she does not take any salicylate-containing products, so this is not  likely a cause of her tinnitus. It is possible that the clindamycin could be causing her tinnitus, as this is a potentially ototoxic medication, and though this is rare I suggest that she stop taking the clindamycin. She states she has tolerated amoxicillin in the past though she could not tolerate augmentin this time, will prescribe amoxicillin for patient's possible dental infection.  She had not tried taking the augmentin with antiemetics, will prescribe antiemetics for her as well to help ensure compliance w/ abx. Patient states she already does have f/u with her dentist already scheduled for next week. Advised compliance w/ abx and to alternate tylenol/ibuprofen, to use oxycodone for severe breakthrough dental pain. DC w/ discharge instructions/return precautions. All questions answered to patient's satisfaction.  Additional history obtained from chart review  Reevaluation: After the interventions noted above, I reevaluated the patient and found that they have :improved  Social Determinants of Health: Lives independently  Disposition:  DC w/ discharge instructions/return precautions. All questions answered to patient's satisfaction.    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    Dispense:  21 capsule    Refill:  0   ondansetron (ZOFRAN-ODT) 4 MG disintegrating tablet    Sig: Take 1 tablet (4 mg total) by  mouth every 8 (eight) hours as needed.    Dispense:  20 tablet    Refill:  0   oxyCODONE (ROXICODONE) 5 MG immediate release tablet    Sig: Take 1 tablet (5 mg total) by mouth every 4 (four) hours as needed for up to 5 days for severe pain (pain score 7-10).    Dispense:  18 tablet    Refill:  0    I have reviewed the patients home medicines and have made adjustments as needed  Problem List / ED Course: Problem List Items Addressed This Visit   None Visit Diagnoses       Pain, dental    -  Primary                   This note was created using dictation software, which may contain spelling or grammatical errors.    Loetta Rough, MD 07/09/23 1500

## 2023-07-27 ENCOUNTER — Ambulatory Visit: Payer: MEDICAID | Admitting: *Deleted

## 2023-07-27 ENCOUNTER — Encounter: Payer: Self-pay | Admitting: *Deleted

## 2023-07-27 VITALS — BP 120/81 | HR 84 | Wt 168.8 lb

## 2023-07-27 DIAGNOSIS — Z3201 Encounter for pregnancy test, result positive: Secondary | ICD-10-CM

## 2023-07-27 DIAGNOSIS — N926 Irregular menstruation, unspecified: Secondary | ICD-10-CM

## 2023-07-27 LAB — POCT URINE PREGNANCY: Preg Test, Ur: POSITIVE — AB

## 2023-07-27 NOTE — Progress Notes (Signed)
   NURSE VISIT- PREGNANCY CONFIRMATION   SUBJECTIVE:  Patricia Richards is a 33 y.o. [phone] female at [redacted]w[redacted]d by certain LMP of Patient's last menstrual period was 06/18/2023 (exact date). Here for pregnancy confirmation.  Home pregnancy test: positive x 6   She reports no complaints.  She is taking prenatal vitamins.    OBJECTIVE:  BP 120/81 (BP Location: Left Arm, Patient Position: Sitting, Cuff Size: Normal)   Pulse 84   Wt 168 lb 12.8 oz (76.6 kg)   LMP 06/18/2023 (Exact Date)   BMI 29.90 kg/m   Appears well, in no apparent distress  Results for orders placed or performed in visit on 07/27/23 (from the past 24 hours)  POCT urine pregnancy   Collection Time: 07/27/23  2:50 PM  Result Value Ref Range   Preg Test, Ur Positive (A) Negative    ASSESSMENT: Positive pregnancy test, [redacted]w[redacted]d by LMP    PLAN: Schedule for dating ultrasound in 2 week Prenatal vitamins: continue   Nausea medicines: not currently needed   OB packet given: Yes  Annamarie Dawley  07/27/2023 2:57 PM

## 2023-07-28 ENCOUNTER — Emergency Department (HOSPITAL_COMMUNITY)
Admission: EM | Admit: 2023-07-28 | Discharge: 2023-07-28 | Disposition: A | Discharge status: Home / Self Care | Payer: MEDICAID

## 2023-07-28 ENCOUNTER — Encounter (HOSPITAL_COMMUNITY): Payer: Self-pay

## 2023-07-28 ENCOUNTER — Emergency Department (HOSPITAL_COMMUNITY): Payer: MEDICAID

## 2023-07-28 ENCOUNTER — Other Ambulatory Visit: Payer: Self-pay

## 2023-07-28 DIAGNOSIS — R0789 Other chest pain: Secondary | ICD-10-CM | POA: Diagnosis present

## 2023-07-28 LAB — D-DIMER, QUANTITATIVE: D-Dimer, Quant: <0.27 ug{FEU}/mL (ref 0.00–0.50)

## 2023-07-28 LAB — COOXEMETRY PANEL
Carboxyhemoglobin: 0.8 % (ref 0.5–1.5)
Methemoglobin: <0.7 % (ref 0.0–1.5)
O2 Saturation: 48.8 %
Total hemoglobin: 13.1 g/dL (ref 12.0–16.0)

## 2023-07-28 LAB — BASIC METABOLIC PANEL
Anion gap: 10 (ref 5–15)
BUN: 9 mg/dL (ref 6–20)
CO2: 22 mmol/L (ref 22–32)
Calcium: 9.4 mg/dL (ref 8.9–10.3)
Chloride: 104 mmol/L (ref 98–111)
Creatinine, Ser: 0.55 mg/dL (ref 0.44–1.00)
GFR, Estimated: >60 mL/min (ref >60)
Glucose, Bld: 108 mg/dL — ABNORMAL HIGH (ref 70–99)
Potassium: 3.4 mmol/L — ABNORMAL LOW (ref 3.5–5.1)
Sodium: 136 mmol/L (ref 135–145)

## 2023-07-28 LAB — CBC
HCT: 37.4 % (ref 36.0–46.0)
Hemoglobin: 12.3 g/dL (ref 12.0–15.0)
MCH: 29.4 pg (ref 26.0–34.0)
MCHC: 32.9 g/dL (ref 30.0–36.0)
MCV: 89.5 fL (ref 80.0–100.0)
Platelets: 263 10*3/uL (ref 150–400)
RBC: 4.18 MIL/uL (ref 3.87–5.11)
RDW: 13 % (ref 11.5–15.5)
WBC: 8.1 10*3/uL (ref 4.0–10.5)
nRBC: 0 % (ref 0.0–0.2)

## 2023-07-28 LAB — TROPONIN I (HIGH SENSITIVITY)
Troponin I (High Sensitivity): <2 ng/L (ref <18)
Troponin I (High Sensitivity): <2 ng/L (ref <18)

## 2023-07-28 MED ORDER — LACTATED RINGERS IV BOLUS
1000.0000 mL | Freq: Once | INTRAVENOUS | Status: AC
  Administered 2023-07-28: 1000 mL via INTRAVENOUS

## 2023-07-28 MED ORDER — ACETAMINOPHEN 500 MG PO TABS
1000.0000 mg | ORAL_TABLET | Freq: Once | ORAL | Status: DC
Start: 2023-07-28 — End: 2023-07-29
  Filled 2023-07-28: qty 2

## 2023-07-28 NOTE — ED Provider Notes (Signed)
 [place] EMERGENCY DEPARTMENT AT [facility] Provider Note   CSN: [id number] Arrival date & time: 07/28/23  1724     History  Chief Complaint  Patient presents with   Pleurisy    Lasundra R Osuch is a 33 y.o. female.  33 year old female who is [redacted] weeks pregnant by last menstrual period presenting to the emergency department today with chest pain.  The patient states that she started with some chest pressure/pain earlier this evening.  She states that she has a new car and was smelling some exhaust fumes.  She states that she has this periodically and it mostly occurs while driving.  She states she also has a history of anxiety and is unsure if this may be contributing.  She came to the ER today for further evaluation regarding this.  She denies a history of DVT or pulmonary embolism, recent surgeries, recent travel.  Denies any leg pain or swelling.  She denies any abdominal pain, vaginal bleeding, or discharge.        Home Medications Prior to Admission medications   Medication Sig Start Date End Date Taking? Authorizing Provider  acetaminophen (TYLENOL) 325 MG tablet Take 2 tablets (650 mg total) by mouth every 4 (four) hours as needed for up to 180 doses (for pain scale < 4). 07/11/21   Calvert Cantor, CNM  amoxicillin (AMOXIL) 500 MG capsule Take 1 capsule (500 mg total) by mouth 3 (three) times daily. 07/03/23   Loetta Rough, MD  amphetamine-dextroamphetamine (ADDERALL XR) 10 MG 24 hr capsule Take 10 mg by mouth every morning. Patient not taking: Reported on 07/27/2023 04/05/23   [provider]  Blood Pressure Monitor MISC For regular home bp monitoring during pregnancy Patient not taking: Reported on 07/27/2023 01/05/21   Cheral Marker, CNM  clonazePAM (KLONOPIN) 1 MG tablet TAKE (1) TABLET BY MOUTH THREE TIMES DAILY 08/20/21   Cresenzo-Dishmon, Scarlette Calico, CNM      Allergies    Procardia xl [nifedipine er], Tramadol, Reglan [metoclopramide hcl], and  Toradol [ketorolac tromethamine]    Review of Systems   Review of Systems  Respiratory:  Positive for chest tightness.   All other systems reviewed and are negative.   Physical Exam Updated Vital Signs BP 131/74   Pulse 97   Temp 98 F (36.7 C) (Oral)   Resp 18   Ht 5\' 3"  (1.6 m)   Wt 76.6 kg   LMP 06/18/2023 (Exact Date)   SpO2 98%   BMI 29.90 kg/m  Physical Exam Vitals and nursing note reviewed.   Gen: NAD Eyes: PERRL, EOMI HEENT: no oropharyngeal swelling Neck: trachea midline Resp: clear to auscultation bilaterally Card: RRR, no murmurs, rubs, or gallops Abd: nontender, nondistended Extremities: no calf tenderness, no edema Vascular: 2+ radial pulses bilaterally, 2+ DP pulses bilaterally Skin: no rashes Psyc: acting appropriately   ED Results / Procedures / Treatments   Labs (all labs ordered are listed, but only abnormal results are displayed) Labs Reviewed  BASIC METABOLIC PANEL - Abnormal; Notable for the following components:      Result Value   Potassium 3.4 (*)    Glucose, Bld 108 (*)    All other components within normal limits  CBC  D-DIMER, QUANTITATIVE  COOXEMETRY PANEL  TROPONIN I (HIGH SENSITIVITY)  TROPONIN I (HIGH SENSITIVITY)    EKG EKG Interpretation Date/Time:  Thursday July 28 2023 19:40:56 EDT Ventricular Rate:  103 PR Interval:  171 QRS Duration:  90 QT Interval:  343 QTC Calculation: 449 R Axis:   84  Text Interpretation: Sinus tachycardia RSR' in V1 or V2, right VCD or RVH Borderline T abnormalities, diffuse leads Confirmed by Beckey Downing [phone]) on 07/28/2023 8:24:53 PM  Radiology DG Chest Port 1 View Result Date: 07/28/2023 CLINICAL DATA:  Chest pain EXAM: PORTABLE CHEST 1 VIEW COMPARISON:  None Available. FINDINGS: Lungs are well expanded, symmetric, and clear. No pneumothorax or pleural effusion. Cardiac size within normal limits. Pulmonary vascularity is normal. Osseous structures are age-appropriate. Mild thoracic  dextroscoliosis. No acute bone abnormality. IMPRESSION: No active disease. Electronically Signed   By: Helyn Numbers M.D.   On: 07/28/2023 21:42    Procedures Procedures    Medications Ordered in ED Medications  acetaminophen (TYLENOL) tablet 1,000 mg (1,000 mg Oral Not Given 07/28/23 2157)  lactated ringers bolus 1,000 mL (1,000 mLs Intravenous New Bag/Given 07/28/23 2209)    ED Course/ Medical Decision Making/ A&P                                 Medical Decision Making 33 year old female who is [redacted] weeks pregnant by last menstrual period presenting to the emergency department today with chest pain and nausea.  There is some concern that this occurs while she has been driving and inhaling exhaust fumes.  I will further evaluate her here with basic labs Wels and EKG, chest x-ray, and troponin to further evaluate for myocarditis, pericarditis, pulmonary edema, pulmonary infiltrates, or pneumothorax.  Also obtain a D-dimer to evaluate for pulmonary embolism as the patient is mildly tachycardic here on arrival.  Also obtain a carboxyhemoglobin level.  Suspicion for this is relatively low.  I will reevaluate for ultimate disposition.  Based on description of her symptoms suspicion for aortic dissection is low at this time.  Amount and/or Complexity of Data Reviewed Labs: ordered. Radiology: ordered.  Risk OTC drugs.           Final Clinical Impression(s) / ED Diagnoses Final diagnoses:  Atypical chest pain    Rx / DC Orders ED Discharge Orders     None         Durwin Glaze, MD 07/28/23 2305

## 2023-07-28 NOTE — Discharge Instructions (Addendum)
 Your workup today was reassuring.  Please take Tylenol at home as needed for pain.  Your test for the carbon monoxide and all of the heart test and chest x-ray were unremarkable.  Return to the ER for worsening symptoms.  Please follow-up with your OB/GYN.

## 2023-07-28 NOTE — ED Triage Notes (Signed)
 Pt arrived via POV c/o chest discomfort after reportedly "sitting in a car all day." Pt also reports she is apprx [redacted] weeks pregnant.

## 2023-07-29 ENCOUNTER — Encounter: Payer: Self-pay | Admitting: Advanced Practice Midwife

## 2023-08-04 ENCOUNTER — Encounter: Payer: Self-pay | Admitting: Advanced Practice Midwife

## 2023-08-11 ENCOUNTER — Other Ambulatory Visit: Payer: Self-pay | Admitting: Obstetrics & Gynecology

## 2023-08-11 DIAGNOSIS — O3680X0 Pregnancy with inconclusive fetal viability, not applicable or unspecified: Secondary | ICD-10-CM

## 2023-08-12 ENCOUNTER — Ambulatory Visit (INDEPENDENT_AMBULATORY_CARE_PROVIDER_SITE_OTHER): Payer: MEDICAID

## 2023-08-12 DIAGNOSIS — Z3491 Encounter for supervision of normal pregnancy, unspecified, first trimester: Secondary | ICD-10-CM | POA: Diagnosis not present

## 2023-08-12 DIAGNOSIS — O3680X0 Pregnancy with inconclusive fetal viability, not applicable or unspecified: Secondary | ICD-10-CM

## 2023-08-12 DIAGNOSIS — Z3A08 8 weeks gestation of pregnancy: Secondary | ICD-10-CM

## 2023-08-12 NOTE — Progress Notes (Signed)
 [place] 7+1 wks,single IUP with yolk sac,CRL 10.2 mm,FHR 138 bpm,normal ovaries

## 2023-08-31 ENCOUNTER — Encounter: Payer: Self-pay | Admitting: Obstetrics and Gynecology

## 2023-08-31 ENCOUNTER — Other Ambulatory Visit (HOSPITAL_COMMUNITY)
Admission: RE | Admit: 2023-08-31 | Discharge: 2023-08-31 | Disposition: A | Discharge status: Home / Self Care | Payer: MEDICAID | Source: Ambulatory Visit | Attending: Obstetrics and Gynecology | Admitting: Obstetrics and Gynecology

## 2023-08-31 ENCOUNTER — Ambulatory Visit: Payer: MEDICAID | Admitting: Obstetrics and Gynecology

## 2023-08-31 VITALS — BP 116/76 | HR 90 | Ht 64.0 in | Wt 167.6 lb

## 2023-08-31 DIAGNOSIS — N760 Acute vaginitis: Secondary | ICD-10-CM | POA: Diagnosis not present

## 2023-08-31 DIAGNOSIS — N898 Other specified noninflammatory disorders of vagina: Secondary | ICD-10-CM | POA: Diagnosis present

## 2023-08-31 DIAGNOSIS — R300 Dysuria: Secondary | ICD-10-CM | POA: Diagnosis not present

## 2023-08-31 DIAGNOSIS — B9689 Other specified bacterial agents as the cause of diseases classified elsewhere: Secondary | ICD-10-CM | POA: Diagnosis not present

## 2023-08-31 LAB — POCT URINALYSIS DIPSTICK OB
Blood, UA: NEGATIVE
Glucose, UA: NEGATIVE
Ketones, UA: NEGATIVE
Leukocytes, UA: NEGATIVE
Nitrite, UA: NEGATIVE
POC,PROTEIN,UA: NEGATIVE

## 2023-08-31 MED ORDER — METRONIDAZOLE 500 MG PO TABS: 500.0000 mg | ORAL_TABLET | Freq: Two times a day (BID) | ORAL | 0 refills | Status: AC

## 2023-08-31 NOTE — Progress Notes (Signed)
   GYNECOLOGY PROGRESS NOTE  History:  33 y.o. [id number] presents to [facility] visit. Reports lots of vaginal discharge, burning with urination, and vaginal itching went to urgent care [place] was told she had BV and did not rx script, instructed to follow up with obgyn.   Health Maintenance Due  Topic Date Due   COVID-19 Vaccine (1) Never done   Cervical Cancer Screening (HPV/Pap Cotest)  03/21/2023     Review of Systems:  Pertinent items are noted in HPI.   Objective:  Physical Exam Blood pressure 116/76, pulse 90, height 5\' 4"  (1.626 m), weight 167 lb 9.6 oz (76 kg), last menstrual period 06/17/2023. VS reviewed, nursing note reviewed,  Constitutional: well developed, well nourished, no distress HEENT: normocephalic CV: normal rate Pulm/chest wall: normal effort Breast Exam: deferred Abdomen: soft Neuro: alert and oriented  Skin: warm, dry Psych: affect normal Pelvic exam: Pelvic: normal appearing vulva with no masses, tenderness or lesions  VAGINA: normal appearing vagina with normal color and discharge, no lesions   FHT 171 bedside u/s via Amber  Assessment & Plan:  1. Burning with urination (Primary) UA neg yesterday and today  - POC Urinalysis Dipstick OB  2. Vaginal discharge 3. Vaginal itching  - Cervicovaginal ancillary only  4. Bacterial vaginosis Tx sent, reviewed record from urgent care  FHT 171 via bedside u/s with Amber  Future Appointments  Date Time Provider Department Center  09/20/2023  2:15 PM [facility] US  [facility] None  09/20/2023  3:00 PM Myrl Askew, RN [facility]  09/20/2023  3:30 PM Ferd Householder, CNM [facility]  10/05/2023  1:00 PM Gennaro Khat, Cadence H, PA-C [facility]    Susi Eric, FNP

## 2023-09-02 LAB — CERVICOVAGINAL ANCILLARY ONLY
Bacterial Vaginitis (gardnerella): NEGATIVE
Candida Glabrata: POSITIVE — AB
Candida Vaginitis: POSITIVE — AB
Chlamydia: NEGATIVE
Comment: NEGATIVE
Comment: NEGATIVE
Comment: NEGATIVE
Comment: NEGATIVE
Comment: NEGATIVE
Comment: NORMAL
Neisseria Gonorrhea: NEGATIVE
Trichomonas: NEGATIVE

## 2023-09-08 ENCOUNTER — Other Ambulatory Visit: Payer: Self-pay | Admitting: Obstetrics and Gynecology

## 2023-09-08 ENCOUNTER — Encounter: Payer: Self-pay | Admitting: Women's Health

## 2023-09-08 MED ORDER — CLOTRIMAZOLE 1 % VA CREA: 1.0000 | TOPICAL_CREAM | Freq: Every day | VAGINAL | 0 refills | Status: AC

## 2023-09-10 ENCOUNTER — Encounter (HOSPITAL_COMMUNITY): Payer: Self-pay

## 2023-09-10 ENCOUNTER — Emergency Department (HOSPITAL_COMMUNITY)
Admission: EM | Admit: 2023-09-10 | Discharge: 2023-09-11 | Disposition: A | Discharge status: Home / Self Care | Payer: MEDICAID | Attending: Emergency Medicine | Admitting: Emergency Medicine

## 2023-09-10 ENCOUNTER — Other Ambulatory Visit: Payer: Self-pay

## 2023-09-10 DIAGNOSIS — O26891 Other specified pregnancy related conditions, first trimester: Secondary | ICD-10-CM | POA: Insufficient documentation

## 2023-09-10 DIAGNOSIS — O99511 Diseases of the respiratory system complicating pregnancy, first trimester: Secondary | ICD-10-CM | POA: Insufficient documentation

## 2023-09-10 DIAGNOSIS — J069 Acute upper respiratory infection, unspecified: Secondary | ICD-10-CM | POA: Insufficient documentation

## 2023-09-10 DIAGNOSIS — Z3A11 11 weeks gestation of pregnancy: Secondary | ICD-10-CM | POA: Diagnosis not present

## 2023-09-10 DIAGNOSIS — R059 Cough, unspecified: Secondary | ICD-10-CM | POA: Insufficient documentation

## 2023-09-10 LAB — RESP PANEL BY RT-PCR (RSV, FLU A&B, COVID)  RVPGX2
Influenza A by PCR: NEGATIVE
Influenza B by PCR: NEGATIVE
Resp Syncytial Virus by PCR: NEGATIVE
SARS Coronavirus 2 by RT PCR: NEGATIVE

## 2023-09-10 LAB — URINALYSIS, ROUTINE W REFLEX MICROSCOPIC
Bilirubin Urine: NEGATIVE
Glucose, UA: NEGATIVE mg/dL
Hgb urine dipstick: NEGATIVE
Ketones, ur: NEGATIVE mg/dL
Leukocytes,Ua: NEGATIVE
Nitrite: NEGATIVE
Protein, ur: NEGATIVE mg/dL
Specific Gravity, Urine: 1.012 (ref 1.005–1.030)
pH: 6 (ref 5.0–8.0)

## 2023-09-10 NOTE — ED Triage Notes (Signed)
 Pt is coming in with flu like symptoms that have been going on for a few days, no reported fevers, but has nasal congestion and a cough. She took her daughter and herself to UC and got tested for flu/covid/sterp a couple days ago which was negative. She is [redacted] weeks pregnant and reports at home her diastolic pressure was a little elevated and her OBGYN told her to come in.

## 2023-09-10 NOTE — ED Notes (Signed)
Went over d/c paperwork at this time with patient. Pt had no questions, comments or concerns after review and verbally understood them.

## 2023-09-10 NOTE — ED Provider Notes (Signed)
 [place] EMERGENCY DEPARTMENT AT [facility]  Provider Note  CSN: [id number] Arrival date & time: 09/10/23 2153  History Chief Complaint  Patient presents with   Flu-Like symptoms     Patricia Richards is a 33 y.o. female approx [redacted]wks pregnant presents for evaluation of several days of runny nose and cough. Some subjective SOB and soreness in chest with coughing but no fevers. She has checked her pulse ox and no hypoxia. She was seen at [facility] recently and had negative viral testing. She called her Ob today who advised her to come to the ED for evaluation because she reported elevated diastolic BP at home.    Home Medications Prior to Admission medications   Medication Sig Start Date End Date Taking? Authorizing Provider  clotrimazole  (GYNE-LOTRIMIN ) 1 % vaginal cream Place 1 Applicatorful vaginally at bedtime for 7 days. 09/08/23 09/15/23  Zelma Hidden, FNP  acetaminophen  (TYLENOL ) 325 MG tablet Take 2 tablets (650 mg total) by mouth every 4 (four) hours as needed for up to 180 doses (for pain scale < 4). 07/11/21   Susana Enter, CNM  amphetamine-dextroamphetamine (ADDERALL XR) 10 MG 24 hr capsule Take 10 mg by mouth every morning. 04/05/23   [provider]  Blood Pressure Monitor MISC For regular home bp monitoring during pregnancy 01/05/21   Ferd Householder, CNM  clonazePAM  (KLONOPIN ) 1 MG tablet TAKE (1) TABLET BY MOUTH THREE TIMES DAILY 08/20/21   Cresenzo-Dishmon, Blanca Bunch, CNM     Allergies    Procardia  xl [nifedipine  er], Tramadol , Reglan [metoclopramide hcl], and Toradol  [ketorolac  tromethamine ]   Review of Systems   Review of Systems Please see HPI for pertinent positives and negatives  Physical Exam BP 121/77   Pulse 92   Temp 97.6 F (36.4 C)   Resp 18   LMP 06/17/2023 (Exact Date)   SpO2 100%   Physical Exam Vitals and nursing note reviewed.  Constitutional:      Appearance: Normal appearance.  HENT:     Head: Normocephalic and  atraumatic.     Nose: Nose normal.     Mouth/Throat:     Mouth: Mucous membranes are moist.     Pharynx: No oropharyngeal exudate or posterior oropharyngeal erythema.  Eyes:     Extraocular Movements: Extraocular movements intact.     Conjunctiva/sclera: Conjunctivae normal.  Cardiovascular:     Rate and Rhythm: Normal rate.  Pulmonary:     Effort: Pulmonary effort is normal.     Breath sounds: Normal breath sounds. No wheezing or rales.  Abdominal:     General: Abdomen is flat.     Palpations: Abdomen is soft.     Tenderness: There is no abdominal tenderness.  Musculoskeletal:        General: No swelling. Normal range of motion.     Cervical back: Neck supple.  Skin:    General: Skin is warm and dry.  Neurological:     General: No focal deficit present.     Mental Status: She is alert.  Psychiatric:        Mood and Affect: Mood normal.     ED Results / Procedures / Treatments   EKG EKG Interpretation Date/Time:  Saturday September 10 2023 22:29:30 EDT Ventricular Rate:  90 PR Interval:  150 QRS Duration:  94 QT Interval:  380 QTC Calculation: 465 R Axis:   77  Text Interpretation: Duplicate Confirmed by Shawnee Dellen [phone]) on 09/10/2023 11:24:59 PM  Procedures Procedures  Medications Ordered in the ED Medications - No data to display  Initial Impression and Plan  Patient here with several days of viral illness. Exam and vitals are normal. No concern for PNA, PE, pre-eclampsia or other acute life threatening process. Covid/Flu/RSV swab is normal. EKG is unchanged from previous. UA is unremarkable. Recommend supportive care, oral hydration and outpatient follow up.   ED Course       MDM Rules/Calculators/A&P Medical Decision Making Problems Addressed: Viral URI with cough: acute illness or injury  Amount and/or Complexity of Data Reviewed Labs: ordered. Decision-making details documented in ED Course. ECG/medicine tests: ordered and independent  interpretation performed. Decision-making details documented in ED Course.     Final Clinical Impression(s) / ED Diagnoses Final diagnoses:  Viral URI with cough    Rx / DC Orders ED Discharge Orders     None        Charmayne Cooper, MD 09/10/23 2345

## 2023-09-12 NOTE — Telephone Encounter (Signed)
 Pt is calling because she is very concerned about her EKG

## 2023-09-19 ENCOUNTER — Other Ambulatory Visit: Payer: Self-pay | Admitting: Obstetrics & Gynecology

## 2023-09-19 ENCOUNTER — Encounter: Payer: Self-pay | Admitting: Women's Health

## 2023-09-19 DIAGNOSIS — Z3682 Encounter for antenatal screening for nuchal translucency: Secondary | ICD-10-CM

## 2023-09-20 ENCOUNTER — Ambulatory Visit: Payer: MEDICAID | Admitting: Women's Health

## 2023-09-20 ENCOUNTER — Encounter: Payer: Self-pay | Admitting: Women's Health

## 2023-09-20 ENCOUNTER — Ambulatory Visit: Payer: MEDICAID

## 2023-09-20 ENCOUNTER — Encounter: Payer: MEDICAID | Admitting: *Deleted

## 2023-09-20 VITALS — BP 115/75 | HR 106 | Wt 165.4 lb

## 2023-09-20 DIAGNOSIS — Z131 Encounter for screening for diabetes mellitus: Secondary | ICD-10-CM

## 2023-09-20 DIAGNOSIS — B009 Herpesviral infection, unspecified: Secondary | ICD-10-CM

## 2023-09-20 DIAGNOSIS — Z3A12 12 weeks gestation of pregnancy: Secondary | ICD-10-CM

## 2023-09-20 DIAGNOSIS — R8271 Bacteriuria: Secondary | ICD-10-CM

## 2023-09-20 DIAGNOSIS — Z1331 Encounter for screening for depression: Secondary | ICD-10-CM

## 2023-09-20 DIAGNOSIS — Z8632 Personal history of gestational diabetes: Secondary | ICD-10-CM

## 2023-09-20 DIAGNOSIS — Z3481 Encounter for supervision of other normal pregnancy, first trimester: Secondary | ICD-10-CM | POA: Diagnosis not present

## 2023-09-20 DIAGNOSIS — R0602 Shortness of breath: Secondary | ICD-10-CM

## 2023-09-20 DIAGNOSIS — F418 Other specified anxiety disorders: Secondary | ICD-10-CM

## 2023-09-20 DIAGNOSIS — H43393 Other vitreous opacities, bilateral: Secondary | ICD-10-CM

## 2023-09-20 DIAGNOSIS — Z3682 Encounter for antenatal screening for nuchal translucency: Secondary | ICD-10-CM

## 2023-09-20 DIAGNOSIS — Z3480 Encounter for supervision of other normal pregnancy, unspecified trimester: Secondary | ICD-10-CM | POA: Insufficient documentation

## 2023-09-20 NOTE — Progress Notes (Signed)
 US  12+5 wks,measurements c/w dates,fundal placenta,FHR 160 bpm,normal ovaries,NB present,NT 1.6 mm,CRL 65.56 mm

## 2023-09-20 NOTE — Patient Instructions (Signed)
Patricia Richards, thank you for choosing our office today! We appreciate the opportunity to meet your healthcare needs. You may receive a short survey by mail, e-mail, or through MyChart. If you are happy with your care we would appreciate if you could take just a few minutes to complete the survey questions. We read all of your comments and take your feedback very seriously. Thank you again for choosing our office.  [facility]  [facility] ([address] [place], [place] [address]) Entrance C, located off of [address] Free 24/7 valet parking   Nausea & Vomiting Have saltine crackers or pretzels by your bed and eat a few bites before you raise your head out of bed in the morning Eat small frequent meals throughout the day instead of large meals Drink plenty of fluids throughout the day to stay hydrated, just don't drink a lot of fluids with your meals.  This can make your stomach fill up faster making you feel sick Do not brush your teeth right after you eat Products with real ginger are good for nausea, like ginger ale and ginger hard candy Make sure it says made with real ginger! Sucking on sour candy like lemon heads is also good for nausea If your prenatal vitamins make you nauseated, take them at night so you will sleep through the nausea Sea Bands If you feel like you need medicine for the nausea & vomiting please let us know If you are unable to keep any fluids or food down please let us know   Constipation Drink plenty of fluid, preferably water, throughout the day Eat foods high in fiber such as fruits, vegetables, and grains Exercise, such as walking, is a good way to keep your bowels regular Drink warm fluids, especially warm prune juice, or decaf coffee Eat a 1/2 cup of real oatmeal (not instant), 1/2 cup applesauce, and 1/2-1 cup warm prune juice every day If needed, you may take Colace (docusate sodium) stool softener  once or twice a day to help keep the stool soft.  If you still are having problems with constipation, you may take Miralax once daily as needed to help keep your bowels regular.   Home Blood Pressure Monitoring for Patients   Your provider has recommended that you check your blood pressure (BP) at least once a week at home. If you do not have a blood pressure cuff at home, one will be provided for you. Contact your provider if you have not received your monitor within 1 week.   Helpful Tips for Accurate Home Blood Pressure Checks  Don't smoke, exercise, or drink caffeine 30 minutes before checking your BP Use the restroom before checking your BP (a full bladder can raise your pressure) Relax in a comfortable upright chair Feet on the ground Left arm resting comfortably on a flat surface at the level of your heart Legs uncrossed Back supported Sit quietly and don't talk Place the cuff on your bare arm Adjust snuggly, so that only two fingertips can fit between your skin and the top of the cuff Check 2 readings separated by at least one minute Keep a log of your BP readings For a visual, please reference this diagram: http://ccnc.care/bpdiagram  Provider Name: [facility]     Phone: [phone]  Zone 1: ALL CLEAR  Continue to monitor your symptoms:  BP reading is less than 140 (top number) or less than 90 (bottom   number)  No right upper stomach pain No headaches or seeing spots No feeling nauseated or throwing up No swelling in face and hands  Zone 2: CAUTION Call your doctor's office for any of the following:  BP reading is greater than 140 (top number) or greater than 90 (bottom number)  Stomach pain under your ribs in the middle or right side Headaches or seeing spots Feeling nauseated or throwing up Swelling in face and hands  Zone 3: EMERGENCY  Seek immediate medical care if you have any of the following:  BP reading is greater than160 (top number) or greater than  110 (bottom number) Severe headaches not improving with Tylenol Serious difficulty catching your breath Any worsening symptoms from Zone 2    First Trimester of Pregnancy The first trimester of pregnancy is from week 1 until the end of week 12 (months 1 through 3). A week after a sperm fertilizes an egg, the egg will implant on the wall of the uterus. This embryo will begin to develop into a baby. Genes from you and your partner are forming the baby. The female genes determine whether the baby is a boy or a girl. At 6-8 weeks, the eyes and face are formed, and the heartbeat can be seen on ultrasound. At the end of 12 weeks, all the baby's organs are formed.  Now that you are pregnant, you will want to do everything you can to have a healthy baby. Two of the most important things are to get good prenatal care and to follow your health care provider's instructions. Prenatal care is all the medical care you receive before the baby's birth. This care will help prevent, find, and treat any problems during the pregnancy and childbirth. BODY CHANGES Your body goes through many changes during pregnancy. The changes vary from woman to woman.  You may gain or lose a couple of pounds at first. You may feel sick to your stomach (nauseous) and throw up (vomit). If the vomiting is uncontrollable, call your health care provider. You may tire easily. You may develop headaches that can be relieved by medicines approved by your health care provider. You may urinate more often. Painful urination may mean you have a bladder infection. You may develop heartburn as a result of your pregnancy. You may develop constipation because certain hormones are causing the muscles that push waste through your intestines to slow down. You may develop hemorrhoids or swollen, bulging veins (varicose veins). Your breasts may begin to grow larger and become tender. Your nipples may stick out more, and the tissue that surrounds them  (areola) may become darker. Your gums may bleed and may be sensitive to brushing and flossing. Dark spots or blotches (chloasma, mask of pregnancy) may develop on your face. This will likely fade after the baby is born. Your menstrual periods will stop. You may have a loss of appetite. You may develop cravings for certain kinds of food. You may have changes in your emotions from day to day, such as being excited to be pregnant or being concerned that something may go wrong with the pregnancy and baby. You may have more vivid and strange dreams. You may have changes in your hair. These can include thickening of your hair, rapid growth, and changes in texture. Some women also have hair loss during or after pregnancy, or hair that feels dry or thin. Your hair will most likely return to normal after your baby is born. WHAT TO EXPECT AT YOUR PRENATAL   VISITS During a routine prenatal visit: You will be weighed to make sure you and the baby are growing normally. Your blood pressure will be taken. Your abdomen will be measured to track your baby's growth. The fetal heartbeat will be listened to starting around week 10 or 12 of your pregnancy. Test results from any previous visits will be discussed. Your health care provider may ask you: How you are feeling. If you are feeling the baby move. If you have had any abnormal symptoms, such as leaking fluid, bleeding, severe headaches, or abdominal cramping. If you have any questions. Other tests that may be performed during your first trimester include: Blood tests to find your blood type and to check for the presence of any previous infections. They will also be used to check for low iron levels (anemia) and Rh antibodies. Later in the pregnancy, blood tests for diabetes will be done along with other tests if problems develop. Urine tests to check for infections, diabetes, or protein in the urine. An ultrasound to confirm the proper growth and development  of the baby. An amniocentesis to check for possible genetic problems. Fetal screens for spina bifida and Down syndrome. You may need other tests to make sure you and the baby are doing well. HOME CARE INSTRUCTIONS  Medicines Follow your health care provider's instructions regarding medicine use. Specific medicines may be either safe or unsafe to take during pregnancy. Take your prenatal vitamins as directed. If you develop constipation, try taking a stool softener if your health care provider approves. Diet Eat regular, well-balanced meals. Choose a variety of foods, such as meat or vegetable-based protein, fish, milk and low-fat dairy products, vegetables, fruits, and whole grain breads and cereals. Your health care provider will help you determine the amount of weight gain that is right for you. Avoid raw meat and uncooked cheese. These carry germs that can cause birth defects in the baby. Eating four or five small meals rather than three large meals a day may help relieve nausea and vomiting. If you start to feel nauseous, eating a few soda crackers can be helpful. Drinking liquids between meals instead of during meals also seems to help nausea and vomiting. If you develop constipation, eat more high-fiber foods, such as fresh vegetables or fruit and whole grains. Drink enough fluids to keep your urine clear or pale yellow. Activity and Exercise Exercise only as directed by your health care provider. Exercising will help you: Control your weight. Stay in shape. Be prepared for labor and delivery. Experiencing pain or cramping in the lower abdomen or low back is a good sign that you should stop exercising. Check with your health care provider before continuing normal exercises. Try to avoid standing for long periods of time. Move your legs often if you must stand in one place for a long time. Avoid heavy lifting. Wear low-heeled shoes, and practice good posture. You may continue to have sex  unless your health care provider directs you otherwise. Relief of Pain or Discomfort Wear a good support bra for breast tenderness.   Take warm sitz baths to soothe any pain or discomfort caused by hemorrhoids. Use hemorrhoid cream if your health care provider approves.   Rest with your legs elevated if you have leg cramps or low back pain. If you develop varicose veins in your legs, wear support hose. Elevate your feet for 15 minutes, 3-4 times a day. Limit salt in your diet. Prenatal Care Schedule your prenatal visits by the   twelfth week of pregnancy. They are usually scheduled monthly at first, then more often in the last 2 months before delivery. Write down your questions. Take them to your prenatal visits. Keep all your prenatal visits as directed by your health care provider. Safety Wear your seat belt at all times when driving. Make a list of emergency phone numbers, including numbers for family, friends, the hospital, and police and fire departments. General Tips Ask your health care provider for a referral to a local prenatal education class. Begin classes no later than at the beginning of month 6 of your pregnancy. Ask for help if you have counseling or nutritional needs during pregnancy. Your health care provider can offer advice or refer you to specialists for help with various needs. Do not use hot tubs, steam rooms, or saunas. Do not douche or use tampons or scented sanitary pads. Do not cross your legs for long periods of time. Avoid cat litter boxes and soil used by cats. These carry germs that can cause birth defects in the baby and possibly loss of the fetus by miscarriage or stillbirth. Avoid all smoking, herbs, alcohol, and medicines not prescribed by your health care provider. Chemicals in these affect the formation and growth of the baby. Schedule a dentist appointment. At home, brush your teeth with a soft toothbrush and be gentle when you floss. SEEK MEDICAL CARE IF:   You have dizziness. You have mild pelvic cramps, pelvic pressure, or nagging pain in the abdominal area. You have persistent nausea, vomiting, or diarrhea. You have a bad smelling vaginal discharge. You have pain with urination. You notice increased swelling in your face, hands, legs, or ankles. SEEK IMMEDIATE MEDICAL CARE IF:  You have a fever. You are leaking fluid from your vagina. You have spotting or bleeding from your vagina. You have severe abdominal cramping or pain. You have rapid weight gain or loss. You vomit blood or material that looks like coffee grounds. You are exposed to German measles and have never had them. You are exposed to fifth disease or chickenpox. You develop a severe headache. You have shortness of breath. You have any kind of trauma, such as from a fall or a car accident. Document Released: 04/27/2001 Document Revised: 09/17/2013 Document Reviewed: 03/13/2013 ExitCare Patient Information 2015 [employer]. This information is not intended to replace advice given to you by your health care provider. Make sure you discuss any questions you have with your health care provider.  

## 2023-09-20 NOTE — Progress Notes (Signed)
 INITIAL OBSTETRICAL VISIT Patient name: Patricia Richards MRN [id number]  Date of birth: 09/18/1990 Chief Complaint:   Initial Prenatal Visit (Seeing floaters, SOB, green snot)  History of Present Illness:   Patricia Richards is a 33 y.o. [phone] Caucasian female at [redacted]w[redacted]d by US  at 7 weeks with an Estimated Date of Delivery: 03/29/24 being seen today for her initial obstetrical visit.   Patient's last menstrual period was 06/17/2023 (exact date). Her obstetrical history is significant for  EAB x 1, SAB x 1, term SVB x 7 (w/ h/o A2DM x 2) .   Dep/anx on klonopin  1mg  TID x years from [facility], states won't rx during pregnancy- Dr. Randolm Butte usually does. Therapy w/ [facility] Seeing floaters, some SOB, green snot Last pap recently at [facility]. Results were:  neg per report     09/20/2023    3:06 PM 04/07/2021    9:14 AM 02/13/2019    3:45 PM 07/26/2018   11:19 AM 03/02/2018    3:40 PM  Depression screen PHQ 2/9  Decreased Interest 2 1 0 2 1  Down, Depressed, Hopeless 2 1 0 2 1  PHQ - 2 Score 4 2 0 4 2  Altered sleeping 3 1   1   Tired, decreased energy 3 3   2   Change in appetite 3 2   0  Feeling bad or failure about yourself  3 2   1   Trouble concentrating 3 2   1   Moving slowly or fidgety/restless 3 3   0  Suicidal thoughts 0 0   0  PHQ-9 Score 22 15   7         09/20/2023    3:06 PM 04/07/2021    9:16 AM  GAD 7 : Generalized Anxiety Score  Nervous, Anxious, on Edge 3 3  Control/stop worrying 3 3  Worry too much - different things 3 3  Trouble relaxing 3 2  Restless 3 2  Easily annoyed or irritable 3 3  Afraid - awful might happen 3 3  Total GAD 7 Score 21 19     Review of Systems:   Pertinent items are noted in HPI Denies cramping/contractions, leakage of fluid, vaginal bleeding, abnormal vaginal discharge w/ itching/odor/irritation, headaches, visual changes, shortness of breath, chest pain, abdominal pain, severe nausea/vomiting, or problems with urination or bowel  movements unless otherwise stated above.  Pertinent History Reviewed:  Reviewed past medical,surgical, social, obstetrical and family history.  Reviewed problem list, medications and allergies. OB History  Gravida Para Term Preterm AB Living  10 7 7  0 2 7  SAB IAB Ectopic Multiple Live Births  1 1 0 0 7    # Outcome Date GA Lbr Len/2nd Weight Sex Type Anes PTL Lv  10 Current           9 Term 07/09/21 [redacted]w[redacted]d 06:05 / 00:18 7 lb 12.5 oz (3.53 kg) M Vag-Spont None  LIV     Birth Comments: None  8 SAB 01/15/20     SAB     7 Term 08/14/19 [redacted]w[redacted]d 05:12 / 00:05 7 lb 9.3 oz (3.44 kg) M Vag-Spont None  LIV     Complications: Gestational diabetes  6 Term 09/19/18 [redacted]w[redacted]d 02:55 / 00:09 7 lb 13 oz (3.544 kg) F Vag-Spont EPI  LIV     Complications: Gestational diabetes  5 Term 08/26/17 [redacted]w[redacted]d 01:27 / 00:15 8 lb 12.2 oz (3.975 kg) M Vag-Spont EPI N LIV  4 Term 11/21/15 [redacted]w[redacted]d  8 lb 13 oz (3.997 kg) M Vag-Spont EPI N LIV  3 Term 12/18/13 [redacted]w[redacted]d 14:05 / 01:03 7 lb 9 oz (3.43 kg) M Vag-Spont EPI N LIV     Birth Comments: caput  2 Term 05/28/10 [redacted]w[redacted]d  8 lb 7 oz (3.827 kg) M Vag-Vacuum EPI N LIV     Birth Comments: states induced early due to PUPPS  1 IAB 2012           Physical Assessment:   Vitals:   09/20/23 1517  BP: 115/75  Pulse: (!) 106  Weight: 165 lb 6.4 oz (75 kg)  Body mass index is 28.39 kg/m. Pulse ox 96% RA       Physical Examination:  General appearance - well appearing, and in no distress  Mental status - alert, oriented to person, place, and time  Psych:  She has a normal mood and affect  Skin - warm and dry, normal color, no suspicious lesions noted  Chest - effort normal, all lung fields clear to auscultation bilaterally  Heart - normal rate and regular rhythm  Abdomen - soft, nontender  Extremities:  No swelling or varicosities noted  Thin prep pap is not done   Chaperone: N/A  TODAY'S NT US  12+5 wks,measurements c/w dates,fundal placenta,FHR 160 bpm,normal ovaries,NB  present,NT 1.6 mm,CRL 65.56 mm   No results found for this or any previous visit (from the past 24 hours).  Assessment & Plan:  1) Low-Risk Pregnancy [id number] at [redacted]w[redacted]d with an Estimated Date of Delivery: 03/29/24   2) Initial OB visit  3) Dep/anx> on klonopin  1mg  TID x years from [facility], states won't rx during pregnancy- Dr. Randolm Butte usually does. Therapy w/ [facility]   4) H/O GDM x2> A1C today  5) H/O tachycardia> has f/u appt w/ cards 5/21  6) Floaters, SOB> iron panel today  Meds: No orders of the defined types were placed in this encounter.   Initial labs obtained Continue prenatal vitamins Reviewed n/v relief measures and warning s/s to report Reviewed recommended weight gain based on pre-gravid BMI Encouraged well-balanced diet Genetic & carrier screening discussed: requests Panorama and NT/IT, Horizon neg prev preg Ultrasound discussed; fetal survey: requested CCNC completed> form faxed if has or is planning to apply for medicaid The nature of [facility] [employer] with multiple MDs and other Advanced Practice Providers was explained to patient; also emphasized that fellows, residents, and students are part of our team. Does have home bp cuff. Office bp cuff given: no. Rx sent: n/a. Check bp weekly, let us  know if consistently >140/90.   Follow-up: Return in about 4 weeks (around 10/18/2023) for LROB, 2nd IT, Dr. Randolm Butte, in person; then 8wks from now anatomy u/s and LROB Dr. Randolm Butte. Pap [facility] from [facility] requested today  Orders Placed This Encounter  Procedures   Urine Culture   GC/Chlamydia Probe Amp   CBC/D/Plt+RPR+Rh+ABO+RubIgG...   PANORAMA PRENATAL TEST   Hemoglobin A1c   Integrated 1   Iron, TIBC and Ferritin Panel    Ferd Householder CNM, [facility] 09/20/2023 5:01 PM

## 2023-09-21 ENCOUNTER — Encounter: Payer: Self-pay | Admitting: Women's Health

## 2023-09-21 LAB — IRON,TIBC AND FERRITIN PANEL
Ferritin: 81 ng/mL (ref 15–150)
Iron Saturation: 6 % — CL (ref 15–55)
Iron: 27 ug/dL (ref 27–159)
Total Iron Binding Capacity: 431 ug/dL (ref 250–450)
UIBC: 404 ug/dL (ref 131–425)

## 2023-09-21 LAB — INTEGRATED 1

## 2023-09-21 MED ORDER — FERROUS SULFATE 325 (65 FE) MG PO TABS: 325.0000 mg | ORAL_TABLET | ORAL | 2 refills | Status: DC

## 2023-09-21 NOTE — Addendum Note (Signed)
 Addended by: Ferd Householder on: 09/21/2023 02:38 PM   Modules accepted: Orders

## 2023-09-22 ENCOUNTER — Encounter: Payer: Self-pay | Admitting: Women's Health

## 2023-09-22 DIAGNOSIS — R8271 Bacteriuria: Secondary | ICD-10-CM | POA: Insufficient documentation

## 2023-09-22 LAB — CBC/D/PLT+RPR+RH+ABO+RUBIGG...
Antibody Screen: NEGATIVE
Basophils Absolute: 0 10*3/uL (ref 0.0–0.2)
Basos: 0 %
EOS (ABSOLUTE): 0.1 10*3/uL (ref 0.0–0.4)
Eos: 1 %
HCV Ab: NONREACTIVE
HIV Screen 4th Generation wRfx: NONREACTIVE
Hematocrit: 37.3 % (ref 34.0–46.6)
Hemoglobin: 12.6 g/dL (ref 11.1–15.9)
Hepatitis B Surface Ag: NEGATIVE
Immature Grans (Abs): 0 10*3/uL (ref 0.0–0.1)
Immature Granulocytes: 0 %
Lymphocytes Absolute: 2 10*3/uL (ref 0.7–3.1)
Lymphs: 19 %
MCH: 30.1 pg (ref 26.6–33.0)
MCHC: 33.8 g/dL (ref 31.5–35.7)
MCV: 89 fL (ref 79–97)
Monocytes Absolute: 0.5 10*3/uL (ref 0.1–0.9)
Monocytes: 5 %
Neutrophils Absolute: 7.8 10*3/uL — ABNORMAL HIGH (ref 1.4–7.0)
Neutrophils: 75 %
Platelets: 284 10*3/uL (ref 150–450)
RBC: 4.18 x10E6/uL (ref 3.77–5.28)
RDW: 12.2 % (ref 11.7–15.4)
RPR Ser Ql: NONREACTIVE
Rh Factor: NEGATIVE
Rubella Antibodies, IGG: 1.43 {index} (ref >0.99)
WBC: 10.4 10*3/uL (ref 3.4–10.8)

## 2023-09-22 LAB — INTEGRATED 1
Crown Rump Length: 65.6 mm
Gest. Age on Collection Date: 12.7 wk
PAPP-A Value: 343.8 ng/mL
Race: 1
Sonographer ID#: 309760
Sonographer ID#: 31 a
Weight: 1.6 mm
Weight: 165 [lb_av]

## 2023-09-22 LAB — GC/CHLAMYDIA PROBE AMP
Chlamydia trachomatis, NAA: NEGATIVE
Neisseria Gonorrhoeae by PCR: NEGATIVE

## 2023-09-22 LAB — URINE CULTURE

## 2023-09-22 LAB — HEMOGLOBIN A1C
Est. average glucose Bld gHb Est-mCnc: 103 mg/dL
Hgb A1c MFr Bld: 5.2 % (ref 4.8–5.6)

## 2023-09-22 LAB — HCV INTERPRETATION

## 2023-09-22 MED ORDER — AMOXICILLIN 500 MG PO CAPS: 500.0000 mg | ORAL_CAPSULE | Freq: Two times a day (BID) | ORAL | 0 refills | Status: DC

## 2023-09-22 NOTE — Addendum Note (Signed)
 Addended by: Ferd Householder on: 09/22/2023 12:39 PM   Modules accepted: Orders

## 2023-09-23 DIAGNOSIS — Z3480 Encounter for supervision of other normal pregnancy, unspecified trimester: Secondary | ICD-10-CM

## 2023-09-27 LAB — PANORAMA PRENATAL TEST FULL PANEL:PANORAMA TEST PLUS 5 ADDITIONAL MICRODELETIONS: FETAL FRACTION: 7.4

## 2023-09-28 ENCOUNTER — Ambulatory Visit: Payer: Self-pay | Admitting: Women's Health

## 2023-09-28 DIAGNOSIS — Z3480 Encounter for supervision of other normal pregnancy, unspecified trimester: Secondary | ICD-10-CM

## 2023-09-30 ENCOUNTER — Encounter (HOSPITAL_COMMUNITY): Payer: Self-pay

## 2023-09-30 ENCOUNTER — Emergency Department (HOSPITAL_COMMUNITY): Payer: MEDICAID

## 2023-09-30 ENCOUNTER — Emergency Department (HOSPITAL_COMMUNITY)
Admission: EM | Admit: 2023-09-30 | Discharge: 2023-09-30 | Disposition: A | Discharge status: Home / Self Care | Payer: MEDICAID | Attending: Emergency Medicine | Admitting: Emergency Medicine

## 2023-09-30 ENCOUNTER — Other Ambulatory Visit: Payer: Self-pay

## 2023-09-30 DIAGNOSIS — R000 Tachycardia, unspecified: Secondary | ICD-10-CM | POA: Diagnosis not present

## 2023-09-30 DIAGNOSIS — M25473 Effusion, unspecified ankle: Secondary | ICD-10-CM | POA: Insufficient documentation

## 2023-09-30 DIAGNOSIS — R079 Chest pain, unspecified: Secondary | ICD-10-CM

## 2023-09-30 DIAGNOSIS — R110 Nausea: Secondary | ICD-10-CM | POA: Diagnosis not present

## 2023-09-30 DIAGNOSIS — Z3A14 14 weeks gestation of pregnancy: Secondary | ICD-10-CM | POA: Diagnosis not present

## 2023-09-30 DIAGNOSIS — R0602 Shortness of breath: Secondary | ICD-10-CM | POA: Insufficient documentation

## 2023-09-30 DIAGNOSIS — R0789 Other chest pain: Secondary | ICD-10-CM | POA: Insufficient documentation

## 2023-09-30 DIAGNOSIS — R0600 Dyspnea, unspecified: Secondary | ICD-10-CM | POA: Insufficient documentation

## 2023-09-30 DIAGNOSIS — R197 Diarrhea, unspecified: Secondary | ICD-10-CM | POA: Diagnosis not present

## 2023-09-30 DIAGNOSIS — O26891 Other specified pregnancy related conditions, first trimester: Secondary | ICD-10-CM | POA: Diagnosis present

## 2023-09-30 DIAGNOSIS — R42 Dizziness and giddiness: Secondary | ICD-10-CM | POA: Diagnosis not present

## 2023-09-30 LAB — COMPREHENSIVE METABOLIC PANEL WITH GFR
ALT: 15 U/L (ref 0–44)
AST: 15 U/L (ref 15–41)
Albumin: 4 g/dL (ref 3.5–5.0)
Alkaline Phosphatase: 42 U/L (ref 38–126)
Anion gap: 11 (ref 5–15)
BUN: 7 mg/dL (ref 6–20)
CO2: 21 mmol/L — ABNORMAL LOW (ref 22–32)
Calcium: 9.7 mg/dL (ref 8.9–10.3)
Chloride: 100 mmol/L (ref 98–111)
Creatinine, Ser: 0.55 mg/dL (ref 0.44–1.00)
GFR, Estimated: >60 mL/min (ref >60)
Glucose, Bld: 83 mg/dL (ref 70–99)
Potassium: 3.8 mmol/L (ref 3.5–5.1)
Sodium: 132 mmol/L — ABNORMAL LOW (ref 135–145)
Total Bilirubin: 0.6 mg/dL (ref 0.0–1.2)
Total Protein: 7.8 g/dL (ref 6.5–8.1)

## 2023-09-30 LAB — URINALYSIS, ROUTINE W REFLEX MICROSCOPIC
Bilirubin Urine: NEGATIVE
Glucose, UA: NEGATIVE mg/dL
Hgb urine dipstick: NEGATIVE
Ketones, ur: NEGATIVE mg/dL
Leukocytes,Ua: NEGATIVE
Nitrite: NEGATIVE
Protein, ur: NEGATIVE mg/dL
Specific Gravity, Urine: 1.002 — ABNORMAL LOW (ref 1.005–1.030)
pH: 7 (ref 5.0–8.0)

## 2023-09-30 LAB — CBC WITH DIFFERENTIAL/PLATELET
Abs Immature Granulocytes: 0.03 10*3/uL (ref 0.00–0.07)
Basophils Absolute: 0 10*3/uL (ref 0.0–0.1)
Basophils Relative: 0 %
Eosinophils Absolute: 0 10*3/uL (ref 0.0–0.5)
Eosinophils Relative: 0 %
HCT: 37.2 % (ref 36.0–46.0)
Hemoglobin: 12.8 g/dL (ref 12.0–15.0)
Immature Granulocytes: 0 %
Lymphocytes Relative: 22 %
Lymphs Abs: 1.8 10*3/uL (ref 0.7–4.0)
MCH: 30.4 pg (ref 26.0–34.0)
MCHC: 34.4 g/dL (ref 30.0–36.0)
MCV: 88.4 fL (ref 80.0–100.0)
Monocytes Absolute: 0.4 10*3/uL (ref 0.1–1.0)
Monocytes Relative: 5 %
Neutro Abs: 6.1 10*3/uL (ref 1.7–7.7)
Neutrophils Relative %: 73 %
Platelets: 282 10*3/uL (ref 150–400)
RBC: 4.21 MIL/uL (ref 3.87–5.11)
RDW: 12.9 % (ref 11.5–15.5)
WBC: 8.4 10*3/uL (ref 4.0–10.5)
nRBC: 0 % (ref 0.0–0.2)

## 2023-09-30 LAB — TROPONIN I (HIGH SENSITIVITY)
Troponin I (High Sensitivity): <2 ng/L (ref <18)
Troponin I (High Sensitivity): <2 ng/L (ref <18)

## 2023-09-30 LAB — D-DIMER, QUANTITATIVE: D-Dimer, Quant: <0.27 ug{FEU}/mL (ref 0.00–0.50)

## 2023-09-30 MED ORDER — LACTATED RINGERS IV BOLUS
1000.0000 mL | Freq: Once | INTRAVENOUS | Status: AC
  Administered 2023-09-30: 1000 mL via INTRAVENOUS

## 2023-09-30 NOTE — ED Provider Notes (Signed)
 [place] EMERGENCY DEPARTMENT AT [facility] Provider Note   CSN: [id number] Arrival date & time: 09/30/23  1430     History  Chief Complaint  Patient presents with   Chest Pain   Dizziness   Cough    Patricia Richards is a 33 y.o. female.  HPI 33 year old female presents with shortness of breath, dizziness and chest pain.  She is concerned about a potential blood clot.  Patient states that she has been feeling sick for about a month.  She has had an on and off cough along with some dyspnea.  Has some issues with her heart rate going up when she stands up.  She is about [redacted] weeks pregnant and currently on iron supplementation.  However yesterday while being outside a lot and having poor p.o. intake due to being busy she started to feel very lightheaded last night.  She felt like she was going to pass out and felt like she had to lay down and go to sleep.  She was also having increased dyspnea.  She has been nauseated as well.  Today she had a little bit of diarrhea.  When she woke up her symptoms recurred, especially symptoms being worse with standing.  The dyspnea in the last 24 hours is worse than it has been over the last month or so.  Her doctor was concerned about a blood clot and sent her here for evaluation.  She denies any unilateral leg swelling though has a little bit of ankle swelling.  She denies abdominal or vaginal complaints.  She developed some chest pain under her left breast at around 11 AM but thinks this might be anxiety related.  She states her heart rate has felt like it is going higher when she stands than it typically does. No recent travel.  Home Medications Prior to Admission medications   Medication Sig Start Date End Date Taking? Authorizing Provider  acetaminophen  (TYLENOL ) 325 MG tablet Take 2 tablets (650 mg total) by mouth every 4 (four) hours as needed for up to 180 doses (for pain scale < 4). 07/11/21   Susana Enter, CNM  amoxicillin   (AMOXIL ) 500 MG capsule Take 1 capsule (500 mg total) by mouth 2 (two) times daily. X 7 days 09/22/23   Ferd Householder, CNM  amphetamine-dextroamphetamine (ADDERALL XR) 10 MG 24 hr capsule Take 10 mg by mouth every morning. Patient not taking: Reported on 09/20/2023 04/05/23   [provider]  Blood Pressure Monitor MISC For regular home bp monitoring during pregnancy 01/05/21   Ferd Householder, CNM  clonazePAM  (KLONOPIN ) 1 MG tablet TAKE (1) TABLET BY MOUTH THREE TIMES DAILY 08/20/21   Cresenzo-Dishmon, Blanca Bunch, CNM  ferrous sulfate  325 (65 FE) MG tablet Take 1 tablet (325 mg total) by mouth every other day. 09/21/23   Ferd Householder, CNM  Prenatal Vit-Fe Fumarate-FA (PRENATAL VITAMIN PO) Take by mouth.    [provider]      Allergies    Procardia  xl [nifedipine  er], Tramadol , Reglan [metoclopramide hcl], and Toradol  [ketorolac  tromethamine ]    Review of Systems   Review of Systems  Constitutional:  Negative for fever.  Respiratory:  Positive for cough and shortness of breath.   Cardiovascular:  Positive for chest pain. Negative for leg swelling.  Gastrointestinal:  Positive for nausea. Negative for abdominal pain.  Genitourinary:  Positive for vaginal bleeding.  Neurological:  Positive for light-headedness. Negative for syncope.    Physical Exam Updated Vital Signs  BP 101/66 (BP Location: Left Arm)   Pulse 86   Temp 98.1 F (36.7 C) (Oral)   Resp 19   Ht 5\' 4"  (1.626 m)   Wt 76.2 kg   LMP 06/17/2023 (Exact Date)   SpO2 99%   BMI 28.84 kg/m  Physical Exam Vitals and nursing note reviewed.  Constitutional:      Appearance: She is well-developed.  HENT:     Head: Normocephalic and atraumatic.  Cardiovascular:     Rate and Rhythm: Regular rhythm. Tachycardia present.     Heart sounds: Normal heart sounds.     Comments: HR~100 Pulmonary:     Effort: Pulmonary effort is normal.     Breath sounds: Normal breath sounds.  Abdominal:     Palpations:  Abdomen is soft.     Tenderness: There is no abdominal tenderness.  Musculoskeletal:     Right lower leg: No edema.     Left lower leg: No edema.  Skin:    General: Skin is warm and dry.  Neurological:     Mental Status: She is alert.     ED Results / Procedures / Treatments   Labs (all labs ordered are listed, but only abnormal results are displayed) Labs Reviewed  URINALYSIS, ROUTINE W REFLEX MICROSCOPIC - Abnormal; Notable for the following components:      Result Value   Color, Urine COLORLESS (*)    Specific Gravity, Urine 1.002 (*)    All other components within normal limits  COMPREHENSIVE METABOLIC PANEL WITH GFR - Abnormal; Notable for the following components:   Sodium 132 (*)    CO2 21 (*)    All other components within normal limits  CBC WITH DIFFERENTIAL/PLATELET  D-DIMER, QUANTITATIVE  TROPONIN I (HIGH SENSITIVITY)  TROPONIN I (HIGH SENSITIVITY)    EKG EKG Interpretation Date/Time:  Friday Sep 30 2023 16:45:45 EDT Ventricular Rate:  96 PR Interval:  141 QRS Duration:  83 QT Interval:  354 QTC Calculation: 448 R Axis:   81  Text Interpretation: Sinus rhythm Borderline T abnormalities, inferior leads T wave changes in same distribution as April 2025 Confirmed by Jerilynn Montenegro [phone]) on 09/30/2023 4:49:36 PM  Radiology DG Chest 2 View Result Date: 09/30/2023 CLINICAL DATA:  chest pain, dyspnea EXAM: CHEST - 2 VIEW COMPARISON:  Multiple, most recently July 28, 2023 FINDINGS: No focal airspace consolidation, pleural effusion, or pneumothorax. No cardiomegaly. No acute fracture or destructive lesion. Bilateral nipple adornments. IMPRESSION: No acute cardiopulmonary abnormality. Electronically Signed   By: Rance Burrows M.D.   On: 09/30/2023 17:16    Procedures Procedures    Medications Ordered in ED Medications  lactated ringers  bolus 1,000 mL (0 mLs Intravenous Stopped 09/30/23 1932)    ED Course/ Medical Decision Making/ A&P                                  Medical Decision Making Amount and/or Complexity of Data Reviewed Labs: ordered.    Details: Normal troponin x 2.  Normal D-dimer. Radiology: ordered and independent interpretation performed.    Details: No pneumonia or pneumothorax. ECG/medicine tests: ordered and independent interpretation performed.    Details: Nonspecific changes but unchanged compared to prior.   Patient presents with chest pain and dizziness.  Seems acute on subacute over the last 24 hours.  ECG shows nonspecific T waves but these are unchanged from baseline.  D-dimer sent for otherwise low risk PE  as she does have some tachycardia but no specific risk factors.  She is pregnant but less than 20 weeks.  No recent surgery/travel.  D-dimer is negative.  She is feeling better with some fluids.  Troponins are negative.  At this point I highly doubt ACS, PE, dissection.  Blood pressure is normal.  No pregnancy specific complaints such as vaginal bleeding or abdominal pain.  At this point I think she is stable for discharge home and will recommend she follow-up with PCP and OB/GYN.  Given return precautions.        Final Clinical Impression(s) / ED Diagnoses Final diagnoses:  Nonspecific chest pain    Rx / DC Orders ED Discharge Orders     None         Jerilynn Montenegro, MD 09/30/23 2036

## 2023-09-30 NOTE — ED Triage Notes (Signed)
 Pt here after having chest pain, dizziness and lightheadedness for the last month. Pt stated that she has tachycardia during pregnancy and is worried that this is the same problem.

## 2023-09-30 NOTE — ED Notes (Signed)
 ED Provider at bedside.

## 2023-09-30 NOTE — ED Provider Triage Note (Signed)
 Emergency Medicine Provider Triage Evaluation Note  Patricia Richards , a 33 y.o. female  was evaluated in triage.  Pt complains of chest pain, shortness of breath, dizziness started yesterday.  Thought it was due to dehydration but when the chest pain started today she got more worried.  He called her PCP who wanted her to come to the ER for evaluation, states wanted her to be checked for possible blood clot as well.  Patient noted to be [redacted] weeks pregnant..  Review of Systems  Positive: Chest pain, shortness of breath, dizziness Negative: Syncope, fever  Physical Exam  BP 108/79   Pulse 91   Ht 5\' 4"  (1.626 m)   Wt 76.2 kg   LMP 06/17/2023 (Exact Date)   SpO2 100%   BMI 28.84 kg/m  Gen:   Awake, no distress   Resp:  Normal effort  MSK:   Moves extremities without difficulty  Other:    Medical Decision Making  Medically screening exam initiated at 4:18 PM.  Appropriate orders placed.  Patricia Richards was informed that the remainder of the evaluation will be completed by another provider, this initial triage assessment does not replace that evaluation, and the importance of remaining in the ED until their evaluation is complete.     Aimee Houseman, [place] 09/30/23 [phone]

## 2023-09-30 NOTE — Discharge Instructions (Signed)
 If you develop recurrent, continued, or worsening chest pain, shortness of breath, fever, vomiting, abdominal or back pain, or any other new/concerning symptoms then return to the ER for evaluation.

## 2023-10-01 ENCOUNTER — Encounter (HOSPITAL_COMMUNITY): Payer: Self-pay | Admitting: Emergency Medicine

## 2023-10-01 ENCOUNTER — Emergency Department (HOSPITAL_COMMUNITY)
Admission: EM | Admit: 2023-10-01 | Discharge: 2023-10-01 | Disposition: A | Discharge status: Home / Self Care | Payer: MEDICAID | Attending: Emergency Medicine | Admitting: Emergency Medicine

## 2023-10-01 ENCOUNTER — Other Ambulatory Visit: Payer: Self-pay

## 2023-10-01 ENCOUNTER — Emergency Department (HOSPITAL_COMMUNITY): Payer: MEDICAID

## 2023-10-01 ENCOUNTER — Encounter: Payer: Self-pay | Admitting: Women's Health

## 2023-10-01 DIAGNOSIS — O26891 Other specified pregnancy related conditions, first trimester: Secondary | ICD-10-CM | POA: Diagnosis not present

## 2023-10-01 DIAGNOSIS — R519 Headache, unspecified: Secondary | ICD-10-CM | POA: Insufficient documentation

## 2023-10-01 DIAGNOSIS — O99891 Other specified diseases and conditions complicating pregnancy: Secondary | ICD-10-CM | POA: Insufficient documentation

## 2023-10-01 DIAGNOSIS — Z3A14 14 weeks gestation of pregnancy: Secondary | ICD-10-CM | POA: Diagnosis not present

## 2023-10-01 DIAGNOSIS — E871 Hypo-osmolality and hyponatremia: Secondary | ICD-10-CM | POA: Diagnosis not present

## 2023-10-01 DIAGNOSIS — O99281 Endocrine, nutritional and metabolic diseases complicating pregnancy, first trimester: Secondary | ICD-10-CM | POA: Insufficient documentation

## 2023-10-01 DIAGNOSIS — R42 Dizziness and giddiness: Secondary | ICD-10-CM | POA: Diagnosis not present

## 2023-10-01 LAB — URINALYSIS, ROUTINE W REFLEX MICROSCOPIC
Bilirubin Urine: NEGATIVE
Glucose, UA: NEGATIVE mg/dL
Hgb urine dipstick: NEGATIVE
Ketones, ur: NEGATIVE mg/dL
Leukocytes,Ua: NEGATIVE
Nitrite: NEGATIVE
Protein, ur: NEGATIVE mg/dL
Specific Gravity, Urine: 1.002 — ABNORMAL LOW (ref 1.005–1.030)
pH: 7 (ref 5.0–8.0)

## 2023-10-01 LAB — COMPREHENSIVE METABOLIC PANEL WITH GFR
ALT: 14 U/L (ref 0–44)
AST: 13 U/L — ABNORMAL LOW (ref 15–41)
Albumin: 3.7 g/dL (ref 3.5–5.0)
Alkaline Phosphatase: 41 U/L (ref 38–126)
Anion gap: 8 (ref 5–15)
BUN: 7 mg/dL (ref 6–20)
CO2: 21 mmol/L — ABNORMAL LOW (ref 22–32)
Calcium: 9.1 mg/dL (ref 8.9–10.3)
Chloride: 103 mmol/L (ref 98–111)
Creatinine, Ser: 0.52 mg/dL (ref 0.44–1.00)
GFR, Estimated: >60 mL/min (ref >60)
Glucose, Bld: 84 mg/dL (ref 70–99)
Potassium: 3.3 mmol/L — ABNORMAL LOW (ref 3.5–5.1)
Sodium: 132 mmol/L — ABNORMAL LOW (ref 135–145)
Total Bilirubin: 0.4 mg/dL (ref 0.0–1.2)
Total Protein: 7.4 g/dL (ref 6.5–8.1)

## 2023-10-01 LAB — CBC
HCT: 36.3 % (ref 36.0–46.0)
Hemoglobin: 11.9 g/dL — ABNORMAL LOW (ref 12.0–15.0)
MCH: 29.4 pg (ref 26.0–34.0)
MCHC: 32.8 g/dL (ref 30.0–36.0)
MCV: 89.6 fL (ref 80.0–100.0)
Platelets: 261 10*3/uL (ref 150–400)
RBC: 4.05 MIL/uL (ref 3.87–5.11)
RDW: 12.8 % (ref 11.5–15.5)
WBC: 8.4 10*3/uL (ref 4.0–10.5)
nRBC: 0 % (ref 0.0–0.2)

## 2023-10-01 LAB — MAGNESIUM: Magnesium: 1.9 mg/dL (ref 1.7–2.4)

## 2023-10-01 NOTE — ED Notes (Signed)
 Patient transported to MRI

## 2023-10-01 NOTE — ED Provider Notes (Signed)
 [place] EMERGENCY DEPARTMENT AT [facility] Provider Note   CSN: [id number] Arrival date & time: 10/01/23  1558     History  Chief Complaint  Patient presents with   Dizziness   Weakness    Patricia Richards is a 33 y.o. female.   Dizziness Associated symptoms: weakness   Weakness Associated symptoms: dizziness    The patient is a 33 year old female, she is currently [redacted] weeks pregnant with her 10th pregnancy, she has 7 living children, she reports that over the last several days she has had a feeling of lightheadedness, dizziness occasionally stumbling and mild headache.  She also has a complaint of neck pain with some occasional numbness and weakness of the right hand and right foot, she denies fevers chills nausea vomiting or diarrhea.  She has been drinking a lot of water  and when she was seen yesterday for the same complaints had a rather unremarkable workup, sodium was 132 and her CO2 was just outside the range of normal.  Urinalysis showed a low specific gravity and no signs of infection.  She touch base with her OB/GYN clinic today who told her to come back because they thought that she might have "water  intoxication" from drinking too much water ,    Home Medications Prior to Admission medications   Medication Sig Start Date End Date Taking? Authorizing Provider  acetaminophen  (TYLENOL ) 325 MG tablet Take 2 tablets (650 mg total) by mouth every 4 (four) hours as needed for up to 180 doses (for pain scale < 4). 07/11/21   Susana Enter, CNM  amoxicillin  (AMOXIL ) 500 MG capsule Take 1 capsule (500 mg total) by mouth 2 (two) times daily. X 7 days 09/22/23   Ferd Householder, CNM  amphetamine-dextroamphetamine (ADDERALL XR) 10 MG 24 hr capsule Take 10 mg by mouth every morning. Patient not taking: Reported on 09/20/2023 04/05/23   [provider]  Blood Pressure Monitor MISC For regular home bp monitoring during pregnancy 01/05/21   Ferd Householder,  CNM  clonazePAM  (KLONOPIN ) 1 MG tablet TAKE (1) TABLET BY MOUTH THREE TIMES DAILY 08/20/21   Cresenzo-Dishmon, Blanca Bunch, CNM  ferrous sulfate  325 (65 FE) MG tablet Take 1 tablet (325 mg total) by mouth every other day. 09/21/23   Ferd Householder, CNM  Prenatal Vit-Fe Fumarate-FA (PRENATAL VITAMIN PO) Take by mouth.    [provider]      Allergies    Procardia  xl [nifedipine  er], Tramadol , Reglan [metoclopramide hcl], and Toradol  [ketorolac  tromethamine ]    Review of Systems   Review of Systems  Neurological:  Positive for dizziness and weakness.  All other systems reviewed and are negative.   Physical Exam Updated Vital Signs BP 114/74   Pulse 99   Temp 97.9 F (36.6 C) (Oral)   Resp 18   Ht 1.626 m (5\' 4" )   Wt 76.2 kg   LMP 06/17/2023 (Exact Date)   SpO2 100%   BMI 28.84 kg/m  Physical Exam Vitals and nursing note reviewed.  Constitutional:      General: She is not in acute distress.    Appearance: She is well-developed.  HENT:     Head: Normocephalic and atraumatic.     Mouth/Throat:     Pharynx: No oropharyngeal exudate.  Eyes:     General: No scleral icterus.       Right eye: No discharge.        Left eye: No discharge.     Conjunctiva/sclera: Conjunctivae normal.  Pupils: Pupils are equal, round, and reactive to light.  Neck:     Thyroid : No thyromegaly.     Vascular: No JVD.  Cardiovascular:     Rate and Rhythm: Normal rate and regular rhythm.     Heart sounds: Normal heart sounds. No murmur heard.    No friction rub. No gallop.  Pulmonary:     Effort: Pulmonary effort is normal. No respiratory distress.     Breath sounds: Normal breath sounds. No wheezing or rales.  Abdominal:     General: Bowel sounds are normal. There is no distension.     Palpations: Abdomen is soft. There is no mass.     Tenderness: There is no abdominal tenderness.  Musculoskeletal:        General: No tenderness. Normal range of motion.     Cervical back: Normal  range of motion and neck supple.     Right lower leg: No edema.     Left lower leg: No edema.  Lymphadenopathy:     Cervical: No cervical adenopathy.  Skin:    General: Skin is warm and dry.     Findings: No erythema or rash.  Neurological:     Mental Status: She is alert.     Coordination: Coordination normal.     Comments: Speech is clear, cranial nerves III through XII are intact, memory is intact, strength is normal in all 4 extremities including grips and strength at the bilateral thighs, knees and ankles to extention and flexion, sensation is intact to light touch and pinprick in all 4 extremities. Coordination as tested by finger-nose-finger is normal, no limb ataxia. Normal gait, normal reflexes at the patellar tendons bilaterally  Psychiatric:        Behavior: Behavior normal.     ED Results / Procedures / Treatments   Labs (all labs ordered are listed, but only abnormal results are displayed) Labs Reviewed  URINALYSIS, ROUTINE W REFLEX MICROSCOPIC - Abnormal; Notable for the following components:      Result Value   Color, Urine STRAW (*)    Specific Gravity, Urine 1.002 (*)    All other components within normal limits  COMPREHENSIVE METABOLIC PANEL WITH GFR  CBC  MAGNESIUM     EKG None  Radiology DG Chest 2 View Result Date: 09/30/2023 CLINICAL DATA:  chest pain, dyspnea EXAM: CHEST - 2 VIEW COMPARISON:  Multiple, most recently July 28, 2023 FINDINGS: No focal airspace consolidation, pleural effusion, or pneumothorax. No cardiomegaly. No acute fracture or destructive lesion. Bilateral nipple adornments. IMPRESSION: No acute cardiopulmonary abnormality. Electronically Signed   By: Rance Burrows M.D.   On: 09/30/2023 17:16    Procedures Procedures    Medications Ordered in ED Medications - No data to display  ED Course/ Medical Decision Making/ A&P                                 Medical Decision Making Amount and/or Complexity of Data Reviewed Labs:  ordered. Radiology: ordered.    This patient presents to the ED for concern of abnormal and persistent lightheadedness dizziness and occasionally seeing spots.  She has normal vital signs, she is not tachycardic, she is not hypotensive, she had a normal workup yesterday, probably needs further testing with MRI since she cannot have radiation to make sure there is no signs of brain tumor or aneurysm or other significant abnormality.  Differential diagnosis includes electrolyte abnormalities getting worse,  recheck sodium    Additional history obtained:  Additional history obtained from medical record External records from outside source obtained and reviewed including lab workup   Lab Tests:  I Ordered, and personally interpreted labs.  The pertinent results include: CBC metabolic panel unremarkable and similar to prior with mild hyponatremia   Imaging Studies ordered:  I ordered imaging studies including MRI of the brain and cervical spine I independently visualized and interpreted imaging which showed no acute abnormalities I agree with the radiologist interpretation   Medicines ordered and prescription drug management:   I have reviewed the patients home medicines and have made adjustments as needed   Problem List / ED Course:  Patient has unremarkable workup, normal vital signs, normal exam, stable for discharge can follow-up with OB/GYN on Monday   Social Determinants of Health:  [redacted] weeks pregnant           Final Clinical Impression(s) / ED Diagnoses Final diagnoses:  None    Rx / DC Orders ED Discharge Orders     None         Early Glisson, MD 10/01/23 1946

## 2023-10-01 NOTE — ED Triage Notes (Signed)
 Pt to ER states was seen her yesterday for dizziness, seeing spots and weakness.  States she checked in with her doctor today and she is stilling having same symptoms so she was told to return for further evaluation.  Pt is [redacted] weeks pregnant.

## 2023-10-01 NOTE — ED Notes (Signed)
 Patient removed all piercing jewelry  for MRI and gave them to mother.

## 2023-10-01 NOTE — ED Notes (Addendum)
 Patient transported to MRI

## 2023-10-01 NOTE — Discharge Instructions (Signed)
 Your testing today has been reassuring, you will need to follow-up with your OB/GYN on Monday or Tuesday, your MRI was totally normal of both your brain and your neck.  Your vital signs been normal, I would drink approximately 6 glasses of water  per day

## 2023-10-05 ENCOUNTER — Ambulatory Visit: Payer: MEDICAID | Attending: Physician Assistant

## 2023-10-05 ENCOUNTER — Ambulatory Visit: Payer: MEDICAID | Attending: Medical | Admitting: Physician Assistant

## 2023-10-05 ENCOUNTER — Encounter: Payer: Self-pay | Admitting: Physician Assistant

## 2023-10-05 VITALS — BP 108/74 | HR 80 | Ht 64.0 in | Wt 166.6 lb

## 2023-10-05 DIAGNOSIS — R002 Palpitations: Secondary | ICD-10-CM | POA: Diagnosis present

## 2023-10-05 DIAGNOSIS — R600 Localized edema: Secondary | ICD-10-CM | POA: Insufficient documentation

## 2023-10-05 DIAGNOSIS — R42 Dizziness and giddiness: Secondary | ICD-10-CM | POA: Insufficient documentation

## 2023-10-05 DIAGNOSIS — R0602 Shortness of breath: Secondary | ICD-10-CM | POA: Diagnosis present

## 2023-10-05 DIAGNOSIS — R079 Chest pain, unspecified: Secondary | ICD-10-CM | POA: Diagnosis present

## 2023-10-05 DIAGNOSIS — R000 Tachycardia, unspecified: Secondary | ICD-10-CM | POA: Diagnosis present

## 2023-10-05 MED ORDER — POTASSIUM CHLORIDE CRYS ER 20 MEQ PO TBCR: 80.0000 meq | EXTENDED_RELEASE_TABLET | Freq: Once | ORAL | 3 refills | Status: DC

## 2023-10-05 NOTE — Patient Instructions (Addendum)
 Medication Instructions:  Your physician recommends that you continue on your current medications as directed. Please refer to the Current Medication list given to you today.  *If you need a refill on your cardiac medications before your next appointment, please call your pharmacy*  Lab Work: TSH   If you have labs (blood work) drawn today and your tests are completely normal, you will receive your results only by: MyChart Message (if you have MyChart) OR A paper copy in the mail If you have any lab test that is abnormal or we need to change your treatment, we will call you to review the results.  Testing/Procedures: Your physician has requested that you have an echocardiogram. Echocardiography is a painless test that uses sound waves to create images of your heart. It provides your doctor with information about the size and shape of your heart and how well your heart's chambers and valves are working. This procedure takes approximately one hour. There are no restrictions for this procedure. Please do NOT wear cologne, perfume, aftershave, or lotions (deodorant is allowed). Please arrive 15 minutes prior to your appointment time.  Please note: We ask at that you not bring children with you during ultrasound (echo/ vascular) testing. Due to room size and safety concerns, children are not allowed in the ultrasound rooms during exams. Our front office staff cannot provide observation of children in our lobby area while testing is being conducted. An adult accompanying a patient to their appointment will only be allowed in the ultrasound room at the discretion of the ultrasound technician under special circumstances. We apologize for any inconvenience.  ZIO XT- Long Term Monitor Instructions   Your physician has requested you wear your ZIO patch monitor___14____days.   This is a single patch monitor.  [employer] supplies one patch monitor per enrollment.  Additional stickers are not available.    Please do not apply patch if you will be having a Nuclear Stress Test, Echocardiogram, Cardiac CT, MRI, or Chest Xray during the time frame you would be wearing the monitor. The patch cannot be worn during these tests.  You cannot remove and re-apply the ZIO XT patch monitor.   Your ZIO patch monitor will be sent USPS Priority mail from [facility] directly to your home address. The monitor may also be mailed to a PO BOX if home delivery is not available.   It may take 3-5 days to receive your monitor after you have been enrolled.   Once you have received you monitor, please review enclosed instructions.  Your monitor has already been registered assigning a specific monitor serial # to you.   Applying the monitor   Shave hair from upper left chest.   Hold abrader disc by orange tab.  Rub abrader in 40 strokes over left upper chest as indicated in your monitor instructions.   Clean area with 4 enclosed alcohol pads .  Use all pads to assure are is cleaned thoroughly.  Let dry.   Apply patch as indicated in monitor instructions.  Patch will be place under collarbone on left side of chest with arrow pointing upward.   Rub patch adhesive wings for 2 minutes.Remove white label marked "1".  Remove white label marked "2".  Rub patch adhesive wings for 2 additional minutes.   While looking in a mirror, press and release button in center of patch.  A small green light will flash 3-4 times .  This will be your only indicator the monitor has been turned on.  Do not shower for the first 24 hours.  You may shower after the first 24 hours.   Press button if you feel a symptom. You will hear a small click.  Record Date, Time and Symptom in the Patient Log Book.   When you are ready to remove patch, follow instructions on last 2 pages of Patient Log Book.  Stick patch monitor onto last page of Patient Log Book.   Place Patient Log Book in [place] box.  Use locking tab on box and tape box closed  securely.  The [employer] and [employer] has [employer] on it.  Please place in mailbox as soon as possible.  Your physician should have your test results approximately 7 days after the monitor has been mailed back to [facility].   Call [facility] Customer Care at [phone] if you have questions regarding your ZIO XT patch monitor.  Call them immediately if you see an orange light blinking on your monitor.   If your monitor falls off in less than 4 days contact our Monitor department at [phone].  If your monitor becomes loose or falls off after 4 days call [employer] at [phone] for suggestions on securing your monitor.    Follow-Up: At [facility], you and your health needs are our priority.  As part of our continuing mission to provide you with exceptional heart care, our providers are all part of one team.  This team includes your primary Cardiologist (physician) and Advanced Practice Providers or APPs (Physician Assistants and Nurse Practitioners) who all work together to provide you with the care you need, when you need it.  Your next appointment:   2 -3 month(s)  Provider:   You may see Ola Berger, MD or one of the following Advanced Practice Providers on your designated Care Team:   Woodfin Hays, PA-C  [place], [place] Theotis Flake, [place]     We recommend signing up for the patient portal called "MyChart".  Sign up information is provided on this After Visit Summary.  MyChart is used to connect with patients for Virtual Visits (Telemedicine).  Patients are able to view lab/test results, encounter notes, upcoming appointments, etc.  Non-urgent messages can be sent to your provider as well.   To learn more about what you can do with MyChart, go to [email or web address].   Other Instructions Thank you for choosing [facility]!

## 2023-10-05 NOTE — Progress Notes (Addendum)
 Cardiology Office Note:  .   Date:  10/05/2023  ID:  Patricia Richards, DOB 1990/11/01, MRN [id number] PCP: [facility], [facility]   HeartCare Providers Cardiologist:  Ola Berger, MD {  History of Present Illness: .   Patricia Richards is a 33 y.o. female  with PMHx of palpitations, bradycardia, bipolar disorder, panic/PTSD, Hx of substance abuse reports to office for follow up.   Last seen in heartcare 12/16/2022 with Dr. Avanell Bob for follow-up on palpitations.  At that time, noted SOB only when stress and increased HR when drinking.  Reported drinking 6-12 shots 2 days prior to this visit.  Recommended 48-hour monitor which showed NSR, average HR 81, rare PVCs and PACs.  Continued on Lipitor 20 mg daily.  Presented to [facility] ED on 09/30/2023 & 10/01/2023 at [redacted] weeks pregnant with signs of dizziness and weakness.  Noted that she has been drinking a lot of water .  Also reached out to OB/GYN who thinks she might have water  intoxication from drinking too much water . Workup was normal except NA 132.  Normal vital signs and exam.  Discharge with plan to follow-up with OB/GYN on Monday.   On interview, presented with daughter. Yesterday, episode of palpitations "skipping beats", less than 1 minute, associated with cold rush, occurred while laying down after eating pizza, HR was about 105 at that time. Prior to episode, no recent episodes of palpitations. CP x 1 m/o, dull achy, times varies from hours to all day, can occur at anytime. Reports dizziness x 1 m/o with changing position, worse with standing up. Seeing spots since 1st trimester. Also SOB with exertion and walking. Also reports edema in legs and hands. No abnormal findings by OB/GYN. Will be 15 weeks tomorrow. Notes that this pregnancy is very different and more problematic than previous 7. Denis orthopnea, PND, syncope.   Reports poor water  intake of 16 oz 1-2 bottles of water  per day, 2-3 mini bottles of soda starry "not  caffeine ." Denies any caffeine  since pregnant. Activity has been limited since starting second trimester, however, overall not that active prior to pregnancy.  Quit smoking black and mild 4 years ago, approx 1 per day for less than year. Quit ETOH in 04/2023 and quit cocaine in 2024   Studies Reviewed: .   Heart Monitor 12/2022 Patch Wear Time:  2 days and 0 hours (2024-08-01T10:38:48-0400 to 2024-08-03T10:45:04-399) IMPRESSION: Sinus rhythm   Rates 56 to 142 bpm  Average HR 81 bpm Rare PVCs, PACs    Triggered events corresponded to SR  Patient had a min HR of 56 bpm, max HR of 142 bpm, and avg HR of 81 bpm. Predominant underlying rhythm was Sinus Rhythm. Isolated SVEs were rare (<1.0%), and no SVE Couplets or SVE Triplets were present. Isolated VEs were rare (<1.0%), and no VE Couplets  or VE Triplets were present.   Echo   2023 1. Left ventricular ejection fraction, by estimation, is 55 to 60%. The  left ventricle has normal function. The left ventricle has no regional  wall motion abnormalities. Left ventricular diastolic parameters were  normal.   2. Right ventricular systolic function is normal. The right ventricular  size is normal. There is normal pulmonary artery systolic pressure. The  estimated right ventricular systolic pressure is 16.8 mmHg.   3. The mitral valve is normal in structure. No evidence of mitral valve  regurgitation. No evidence of mitral stenosis.   4. The aortic valve is normal in structure.  Aortic valve regurgitation is  not visualized. No aortic stenosis is present.   5. The inferior vena cava is normal in size with greater than 50%  respiratory variability, suggesting right atrial pressure of 3 mmHg.  Physical Exam:   VS:  LMP 06/17/2023 (Exact Date)    Wt Readings from Last 3 Encounters:  10/01/23 167 lb 15.9 oz (76.2 kg)  09/30/23 168 lb (76.2 kg)  09/20/23 165 lb 6.4 oz (75 kg)    GEN: Well nourished, well developed in no acute distress NECK: No  JVD; No carotid bruits CARDIAC: RRR, no murmurs, rubs, gallops RESPIRATORY:  Clear to auscultation without rales, wheezing or rhonchi  ABDOMEN: Soft, non-tender, non-distended EXTREMITIES:  No edema; No deformity   ASSESSMENT AND PLAN: .   Palpitations 12/2022 Holter monitor: NSR, average HR 81, rare PVCs and PACs.  Yesterday, episode of palpitations "skipping beats", less than 1 minute, associated with cold rush, occurred while laying down after eating pizza, HR was about 105 at that time. Prior to episode, no recent episodes of palpitations.  K 3.3, Mg 1.9, CBC WNL, Order TSH and K supplement.  Encouraged patient to talk with OB/GYN about following K regularly.  Could be 2/2 to anxiety. She is very anxious about this pregnancy since seems more problematic and different than others.  Per patient, workup by OB/GYN negative.  Ordered 14 day monitor   SOB  LE Edema  ECHO 2023: EF 55 to 60%, normal function and structure Reported SOB with exertion and LE edema x 1 m/o Exam revealed no edema and normal breath sounds Suspect 2/2 to pregnancy changes but patient is real anxious & concerned and would like further workup. Ordered ECHO   Chest pain  Dizziness  Negative workup in ED x2 on 09/30/2023 & 10/01/2023.  EKG without any ischemic changes today.  CP x 1 m/o, dull achy, times varies from hours to all day, can occur at anytime.  Noted dizziness with position changes, worse with standing  Negative orthostatic vital signs  Reported poor fluid intake. Encourage to increase water  intake.  Patient willing to do exercise stress test. Will discuss with MD if appropriate and safe for patient at this time since [redacted] weeks pregnant.   ADDENDUM 10/06/2023: Notified patient that Dr. Avanell Bob does not think CP is cardiac related and does not recommend exercise stress test at this time. Can reconsider in the future as indicated. (SYD)  Previous EtOH/tobacco/Drug use Quit smoking black and mild 4 years ago,  approx 1 per day for less than year. Quit ETOH in 04/2023 and quit cocaine in 2024  Dispo: Ordered 14 day heart monitor, ECHO, TSH, K supplement  Signed, Metta Actis, PA-C

## 2023-10-06 ENCOUNTER — Telehealth: Payer: Self-pay | Admitting: Internal Medicine

## 2023-10-06 NOTE — Telephone Encounter (Signed)
 Scottie addressed and spoke with pt.

## 2023-10-06 NOTE — Telephone Encounter (Signed)
 Patient says she is returning a call, but doesn't know who called or what it was regarding. She mentions that she plans to go have labs drawn tomorrow.

## 2023-10-07 ENCOUNTER — Other Ambulatory Visit (HOSPITAL_COMMUNITY)
Admission: RE | Admit: 2023-10-07 | Discharge: 2023-10-07 | Disposition: A | Discharge status: Home / Self Care | Payer: MEDICAID | Source: Ambulatory Visit | Attending: Physician Assistant | Admitting: Physician Assistant

## 2023-10-07 ENCOUNTER — Other Ambulatory Visit: Payer: Self-pay

## 2023-10-07 ENCOUNTER — Emergency Department (HOSPITAL_COMMUNITY)
Admission: EM | Admit: 2023-10-07 | Discharge: 2023-10-08 | Discharge status: Against Medical Advice | Payer: MEDICAID | Attending: Emergency Medicine | Admitting: Emergency Medicine

## 2023-10-07 ENCOUNTER — Encounter (HOSPITAL_COMMUNITY): Payer: Self-pay

## 2023-10-07 DIAGNOSIS — R42 Dizziness and giddiness: Secondary | ICD-10-CM | POA: Diagnosis not present

## 2023-10-07 DIAGNOSIS — Z5329 Procedure and treatment not carried out because of patient's decision for other reasons: Secondary | ICD-10-CM | POA: Insufficient documentation

## 2023-10-07 DIAGNOSIS — O99891 Other specified diseases and conditions complicating pregnancy: Secondary | ICD-10-CM | POA: Insufficient documentation

## 2023-10-07 DIAGNOSIS — R002 Palpitations: Secondary | ICD-10-CM | POA: Insufficient documentation

## 2023-10-07 DIAGNOSIS — Z3A15 15 weeks gestation of pregnancy: Secondary | ICD-10-CM | POA: Diagnosis not present

## 2023-10-07 DIAGNOSIS — Z3492 Encounter for supervision of normal pregnancy, unspecified, second trimester: Secondary | ICD-10-CM

## 2023-10-07 LAB — BASIC METABOLIC PANEL WITH GFR
Anion gap: 7 (ref 5–15)
BUN: 9 mg/dL (ref 6–20)
CO2: 24 mmol/L (ref 22–32)
Calcium: 9.5 mg/dL (ref 8.9–10.3)
Chloride: 104 mmol/L (ref 98–111)
Creatinine, Ser: 0.65 mg/dL (ref 0.44–1.00)
GFR, Estimated: >60 mL/min (ref >60)
Glucose, Bld: 92 mg/dL (ref 70–99)
Potassium: 3.7 mmol/L (ref 3.5–5.1)
Sodium: 135 mmol/L (ref 135–145)

## 2023-10-07 LAB — CBC WITH DIFFERENTIAL/PLATELET
Abs Immature Granulocytes: 0.04 10*3/uL (ref 0.00–0.07)
Basophils Absolute: 0 10*3/uL (ref 0.0–0.1)
Basophils Relative: 0 %
Eosinophils Absolute: 0.1 10*3/uL (ref 0.0–0.5)
Eosinophils Relative: 1 %
HCT: 36.7 % (ref 36.0–46.0)
Hemoglobin: 12.3 g/dL (ref 12.0–15.0)
Immature Granulocytes: 1 %
Lymphocytes Relative: 26 %
Lymphs Abs: 2.2 10*3/uL (ref 0.7–4.0)
MCH: 30.1 pg (ref 26.0–34.0)
MCHC: 33.5 g/dL (ref 30.0–36.0)
MCV: 89.7 fL (ref 80.0–100.0)
Monocytes Absolute: 0.5 10*3/uL (ref 0.1–1.0)
Monocytes Relative: 6 %
Neutro Abs: 5.7 10*3/uL (ref 1.7–7.7)
Neutrophils Relative %: 66 %
Platelets: 274 10*3/uL (ref 150–400)
RBC: 4.09 MIL/uL (ref 3.87–5.11)
RDW: 13.1 % (ref 11.5–15.5)
WBC: 8.5 10*3/uL (ref 4.0–10.5)
nRBC: 0 % (ref 0.0–0.2)

## 2023-10-07 LAB — TSH: TSH: 1.195 u[IU]/mL (ref 0.350–4.500)

## 2023-10-07 NOTE — ED Triage Notes (Signed)
 Pt here for dizziness and weakness. Pt seen here last weekend twice for the same symptoms. Pt stated that she was given potassium because her potassium was low last weekend.

## 2023-10-08 LAB — URINALYSIS, ROUTINE W REFLEX MICROSCOPIC
Bilirubin Urine: NEGATIVE
Glucose, UA: NEGATIVE mg/dL
Hgb urine dipstick: NEGATIVE
Ketones, ur: NEGATIVE mg/dL
Leukocytes,Ua: NEGATIVE
Nitrite: NEGATIVE
Protein, ur: 30 mg/dL — AB
Specific Gravity, Urine: 1.018 (ref 1.005–1.030)
pH: 7 (ref 5.0–8.0)

## 2023-10-08 MED ORDER — ACETAMINOPHEN 325 MG PO TABS
650.0000 mg | ORAL_TABLET | Freq: Once | ORAL | Status: AC
  Administered 2023-10-08: 650 mg via ORAL
  Filled 2023-10-08: qty 2

## 2023-10-08 NOTE — ED Notes (Signed)
 Patient was awaiting urinalysis results that have not returned yet. Patient states she had not eaten since 5 pm the day prior and was very hungry. This RN encouraged patient to stay. Patient had already taken all monitoring equipment off. Was able to have patient sign AMA paperwork. Dr. Candelaria Chaco notified.

## 2023-10-08 NOTE — ED Provider Notes (Signed)
 [place] EMERGENCY DEPARTMENT AT [facility] Provider Note   CSN: [id number] Arrival date & time: 10/07/23  2106     History  Chief Complaint  Patient presents with   Dizziness    Patricia Richards is a 33 y.o. female.  The history is provided by the patient.  Dizziness She has a history of bipolar disorder, pregnancy at 15 weeks 1 day gestation and comes in complaining of ongoing problems with feeling dizzy and weak.  She has had symptoms for the last 2 weeks, and was seen in the emergency department twice last week and was told that her sodium was low, potassium low, CO2 low.  Hemoglobin was normal but her obstetrician checked an iron study and said her iron was low even though her hemoglobin was normal.  Dizziness has some components that sound like vertigo, but she does not always have sense of movement and it is not necessarily triggered by head movement.  She has had some intermittent nausea, but is difficult to separate that from nausea pregnancy.  She is gravida 10, para 7, with 1 abortion and 1 miscarriage.   Home Medications Prior to Admission medications   Medication Sig Start Date End Date Taking? Authorizing Provider  acetaminophen  (TYLENOL ) 325 MG tablet Take 2 tablets (650 mg total) by mouth every 4 (four) hours as needed for up to 180 doses (for pain scale < 4). 07/11/21   Susana Enter, CNM  amoxicillin  (AMOXIL ) 500 MG capsule Take 1 capsule (500 mg total) by mouth 2 (two) times daily. X 7 days Patient not taking: Reported on 10/05/2023 09/22/23   Ferd Householder, CNM  amphetamine-dextroamphetamine (ADDERALL XR) 10 MG 24 hr capsule Take 10 mg by mouth every morning. Patient not taking: Reported on 09/20/2023 04/05/23   [provider]  Blood Pressure Monitor MISC For regular home bp monitoring during pregnancy 01/05/21   Ferd Householder, CNM  clonazePAM  (KLONOPIN ) 1 MG tablet TAKE (1) TABLET BY MOUTH THREE TIMES DAILY 08/20/21   Cresenzo-Dishmon,  Blanca Bunch, CNM  ferrous sulfate  325 (65 FE) MG tablet Take 1 tablet (325 mg total) by mouth every other day. 09/21/23   Ferd Householder, CNM  potassium chloride  SA (KLOR-CON  M) 20 MEQ tablet Take 4 tablets (80 mEq total) by mouth once for 1 dose. 10/05/23 10/05/23  Metta Actis, PA-C  Prenatal Vit-Fe Fumarate-FA (PRENATAL VITAMIN PO) Take by mouth.    [provider]      Allergies    Procardia  xl [nifedipine  er], Tramadol , Reglan [metoclopramide hcl], and Toradol  [ketorolac  tromethamine ]    Review of Systems   Review of Systems  Neurological:  Positive for dizziness.  All other systems reviewed and are negative.   Physical Exam Updated Vital Signs BP 119/80   Pulse 96   Temp 98.1 F (36.7 C) (Oral)   Resp 18   Ht 5\' 4"  (1.626 m)   Wt 76 kg   LMP 06/17/2023 (Exact Date)   SpO2 97%   BMI 28.76 kg/m  Physical Exam Vitals and nursing note reviewed.   33 year old female, resting comfortably and in no acute distress. Vital signs are normal. Oxygen saturation is 97%, which is normal. Head is normocephalic and atraumatic. PERRLA, EOMI. Oropharynx is clear. Neck is nontender and supple without adenopathy. Lungs are clear without rales, wheezes, or rhonchi. Chest is nontender. Heart has regular rate and rhythm without murmur. Skin is warm and dry without rash. Neurologic: Mental status is normal,  cranial nerves are intact, moves all extremities equally.  Dizziness is reproduced by passive head movement..  ED Results / Procedures / Treatments   Labs (all labs ordered are listed, but only abnormal results are displayed) Labs Reviewed  CBC WITH DIFFERENTIAL/PLATELET  BASIC METABOLIC PANEL WITH GFR  URINALYSIS, ROUTINE W REFLEX MICROSCOPIC   Procedures Procedures    Medications Ordered in ED Medications - No data to display  ED Course/ Medical Decision Making/ A&P                                 Medical Decision Making Amount and/or Complexity of Data  Reviewed Labs: ordered.  Risk OTC drugs.   Dizziness in patient in second trimester pregnancy which is probably multifactorial.  There clearly is some component of vertigo and some dizziness may be related to the pregnancy state.  I reviewed her laboratory tests, and my interpretation is normal basic metabolic panel and normal CBC.  Also, she had a normal TSH done earlier today.  I have reviewed her past records, and she had 2 ED visits for dizziness last week-5/16 and 5/17.  CBC was essentially normal although on 1 occasion hemoglobin was 11.9, she had mild hyponatremia and she had mild hypokalemia on 1 visit.  She also had slightly low CO2 on both visits.  Patient is intensely focused on these abnormal blood tests.  I have advised her that I do not feel they have any relation to her current problems.  She had MRI of brain and cervical spine which were normal.  Also, she is concerned about her low iron level, which I do not see any actual iron levels in the labs in epic.  I have suggested that she should take meclizine , but patient states her OB/GYN told her not to.  Have advised her that it is category B and most likely safe.  Also, she is taking Adderall and clonazepam  which have much more likelihood of fetal toxicity.  Urinalysis is pending.    I was planning to discharge her with prescription for meclizine , but patient grew impatient waiting for urinalysis results and eloped before I could talk with her.  Final Clinical Impression(s) / ED Diagnoses Final diagnoses:  Dizziness  Second trimester pregnancy    Rx / DC Orders ED Discharge Orders     None         Alissa April, MD 10/08/23 [phone]

## 2023-10-11 ENCOUNTER — Ambulatory Visit: Payer: Self-pay | Admitting: Physician Assistant

## 2023-10-17 ENCOUNTER — Other Ambulatory Visit: Payer: Self-pay | Admitting: Women's Health

## 2023-10-17 ENCOUNTER — Encounter: Payer: Self-pay | Admitting: Women's Health

## 2023-10-17 MED ORDER — MECLIZINE HCL 12.5 MG PO TABS: 12.5000 mg | ORAL_TABLET | Freq: Three times a day (TID) | ORAL | 6 refills | Status: DC | PRN

## 2023-10-18 ENCOUNTER — Ambulatory Visit: Payer: MEDICAID | Admitting: Women's Health

## 2023-10-18 ENCOUNTER — Encounter: Payer: Self-pay | Admitting: Women's Health

## 2023-10-18 VITALS — BP 111/76 | HR 102 | Wt 164.4 lb

## 2023-10-18 DIAGNOSIS — Z1379 Encounter for other screening for genetic and chromosomal anomalies: Secondary | ICD-10-CM

## 2023-10-18 DIAGNOSIS — Z363 Encounter for antenatal screening for malformations: Secondary | ICD-10-CM

## 2023-10-18 DIAGNOSIS — Z3480 Encounter for supervision of other normal pregnancy, unspecified trimester: Secondary | ICD-10-CM

## 2023-10-18 DIAGNOSIS — O99892 Other specified diseases and conditions complicating childbirth: Secondary | ICD-10-CM

## 2023-10-18 DIAGNOSIS — R8271 Bacteriuria: Secondary | ICD-10-CM

## 2023-10-18 DIAGNOSIS — Z3A16 16 weeks gestation of pregnancy: Secondary | ICD-10-CM | POA: Diagnosis not present

## 2023-10-18 DIAGNOSIS — Z3482 Encounter for supervision of other normal pregnancy, second trimester: Secondary | ICD-10-CM

## 2023-10-18 DIAGNOSIS — O99820 Streptococcus B carrier state complicating pregnancy: Secondary | ICD-10-CM

## 2023-10-18 NOTE — Patient Instructions (Addendum)
 Patricia Richards, thank you for choosing our office today! We appreciate the opportunity to meet your healthcare needs. You may receive a short survey by mail, e-mail, or through [employer]. If you are happy with your care we would appreciate if you could take just a few minutes to complete the survey questions. We read all of your comments and take your feedback very seriously. Thank you again for choosing our office.  Center for [employer] Team at [facility] at [facility] ([address] [place], [place] [address]) Entrance C, located off of [address] Free 24/7 valet parking   For Dizzy Spells:  This is usually related to either your blood sugar or your blood pressure dropping Make sure you are staying well hydrated and drinking enough water  so that your urine is clear Eat small frequent meals and snacks containing protein (meat, eggs, nuts, cheese) so that your blood sugar doesn't drop If you do get dizzy, sit/lay down and get you something to drink and a snack containing protein- you will usually start feeling better in 10-20 minutes   Go to [email or web address] to register for FREE online childbirth classes  Call the office [phone]) or go to [facility] if: You begin to severe cramping Your water  breaks.  Sometimes it is a big gush of fluid, sometimes it is just a trickle that keeps getting your panties wet or running down your legs You have vaginal bleeding.  It is normal to have a small amount of spotting if your cervix was checked.   [facility] Pediatricians/Family Doctors [facility]): [address] Meg Spina, [phone]           [facility]: [address], [phone]                [facility]): [address], [phone] (call to ask if accepting patients) [facility] Department: [address], [place], [phone]    [facility] Pediatricians/Family  Doctors [facility]): [phone] [address], [phone] [facility]: [address] [place], [phone] [facility]: [address], [phone]  [facility] Doctors  [facility]): [phone] [facility]: [address] [phone]   [facility] Doctors [facility]: [address] [phone]  [facility] Doctors  [facility]: [phone], [phone]  Home Blood Pressure Monitoring for Patients   Your provider has recommended that you check your blood pressure (BP) at least once a week at home. If you do not have a blood pressure cuff at home, one will be provided for you. Contact your provider if you have not received your monitor within 1 week.   Helpful Tips for Accurate Home Blood Pressure Checks  Don't smoke, exercise, or drink caffeine  30 minutes before checking your BP Use the restroom before checking your BP (a full bladder can raise your pressure) Relax in a comfortable upright chair Feet on the ground Left arm resting comfortably on a flat surface at the level of your heart Legs uncrossed Back supported Sit quietly and don't talk Place the cuff on your bare arm Adjust snuggly, so that only two fingertips can fit between your skin and the top of the cuff Check 2 readings separated by at least one minute Keep a log of your BP readings For a visual, please reference this diagram: http://ccnc.care/bpdiagram  Provider  Name: [facility]     Phone: [phone]  Zone 1: ALL CLEAR  Continue to monitor your symptoms:  BP reading is less than 140 (top number) or less than 90 (bottom number)  No right upper stomach pain No headaches or seeing spots No feeling nauseated or throwing up No swelling in face and hands  Zone 2: CAUTION Call your doctor's office for any of the following:  BP reading is greater than  140 (top number) or greater than 90 (bottom number)  Stomach pain under your ribs in the middle or right side Headaches or seeing spots Feeling nauseated or throwing up Swelling in face and hands  Zone 3: EMERGENCY  Seek immediate medical care if you have any of the following:  BP reading is greater than160 (top number) or greater than 110 (bottom number) Severe headaches not improving with Tylenol  Serious difficulty catching your breath Any worsening symptoms from Zone 2     Second Trimester of Pregnancy The second trimester is from week 14 through week 27 (months 4 through 6). The second trimester is often a time when you feel your best. Your body has adjusted to being pregnant, and you begin to feel better physically. Usually, morning sickness has lessened or quit completely, you may have more energy, and you may have an increase in appetite. The second trimester is also a time when the fetus is growing rapidly. At the end of the sixth month, the fetus is about 9 inches long and weighs about 1 pounds. You will likely begin to feel the baby move (quickening) between 16 and 20 weeks of pregnancy. Body changes during your second trimester Your body continues to go through many changes during your second trimester. The changes vary from woman to woman. Your weight will continue to increase. You will notice your lower abdomen bulging out. You may begin to get stretch marks on your hips, abdomen, and breasts. You may develop headaches that can be relieved by medicines. The medicines should be approved by your health care provider. You may urinate more often because the fetus is pressing on your bladder. You may develop or continue to have heartburn as a result of your pregnancy. You may develop constipation because certain hormones are causing the muscles that push waste through your intestines to slow down. You may develop hemorrhoids or swollen, bulging veins (varicose veins). You may have  back pain. This is caused by: Weight gain. Pregnancy hormones that are relaxing the joints in your pelvis. A shift in weight and the muscles that support your balance. Your breasts will continue to grow and they will continue to become tender. Your gums may bleed and may be sensitive to brushing and flossing. Dark spots or blotches (chloasma, mask of pregnancy) may develop on your face. This will likely fade after the baby is born. A dark line from your belly button to the pubic area (linea nigra) may appear. This will likely fade after the baby is born. You may have changes in your hair. These can include thickening of your hair, rapid growth, and changes in texture. Some women also have hair loss during or after pregnancy, or hair that feels dry or thin. Your hair will most likely return to normal after your baby is born.  What to expect at prenatal visits During a routine prenatal visit: You will be weighed to make sure you and the fetus are growing normally. Your blood pressure will be taken. Your abdomen will be measured to  track your baby's growth. The fetal heartbeat will be listened to. Any test results from the previous visit will be discussed.  Your health care provider may ask you: How you are feeling. If you are feeling the baby move. If you have had any abnormal symptoms, such as leaking fluid, bleeding, severe headaches, or abdominal cramping. If you are using any tobacco products, including cigarettes, chewing tobacco, and electronic cigarettes. If you have any questions.  Other tests that may be performed during your second trimester include: Blood tests that check for: Low iron levels (anemia). High blood sugar that affects pregnant women (gestational diabetes) between 47 and 28 weeks. Rh antibodies. This is to check for a protein on red blood cells (Rh factor). Urine tests to check for infections, diabetes, or protein in the urine. An ultrasound to confirm the proper  growth and development of the baby. An amniocentesis to check for possible genetic problems. Fetal screens for spina bifida and Down syndrome. HIV (human immunodeficiency virus) testing. Routine prenatal testing includes screening for HIV, unless you choose not to have this test.  Follow these instructions at home: Medicines Follow your health care provider's instructions regarding medicine use. Specific medicines may be either safe or unsafe to take during pregnancy. Take a prenatal vitamin that contains at least 600 micrograms (mcg) of folic acid. If you develop constipation, try taking a stool softener if your health care provider approves. Eating and drinking Eat a balanced diet that includes fresh fruits and vegetables, whole grains, good sources of protein such as meat, eggs, or tofu, and low-fat dairy. Your health care provider will help you determine the amount of weight gain that is right for you. Avoid raw meat and uncooked cheese. These carry germs that can cause birth defects in the baby. If you have low calcium  intake from food, talk to your health care provider about whether you should take a daily calcium  supplement. Limit foods that are high in fat and processed sugars, such as fried and sweet foods. To prevent constipation: Drink enough fluid to keep your urine clear or pale yellow. Eat foods that are high in fiber, such as fresh fruits and vegetables, whole grains, and beans. Activity Exercise only as directed by your health care provider. Most women can continue their usual exercise routine during pregnancy. Try to exercise for 30 minutes at least 5 days a week. Stop exercising if you experience uterine contractions. Avoid heavy lifting, wear low heel shoes, and practice good posture. A sexual relationship may be continued unless your health care provider directs you otherwise. Relieving pain and discomfort Wear a good support bra to prevent discomfort from breast  tenderness. Take warm sitz baths to soothe any pain or discomfort caused by hemorrhoids. Use hemorrhoid cream if your health care provider approves. Rest with your legs elevated if you have leg cramps or low back pain. If you develop varicose veins, wear support hose. Elevate your feet for 15 minutes, 3-4 times a day. Limit salt in your diet. Prenatal Care Write down your questions. Take them to your prenatal visits. Keep all your prenatal visits as told by your health care provider. This is important. Safety Wear your seat belt at all times when driving. Make a list of emergency phone numbers, including numbers for family, friends, the hospital, and police and fire departments. General instructions Ask your health care provider for a referral to a local prenatal education class. Begin classes no later than the beginning of month 6 of your  pregnancy. Ask for help if you have counseling or nutritional needs during pregnancy. Your health care provider can offer advice or refer you to specialists for help with various needs. Do not use hot tubs, steam rooms, or saunas. Do not douche or use tampons or scented sanitary pads. Do not cross your legs for long periods of time. Avoid cat litter boxes and soil used by cats. These carry germs that can cause birth defects in the baby and possibly loss of the fetus by miscarriage or stillbirth. Avoid all smoking, herbs, alcohol, and unprescribed drugs. Chemicals in these products can affect the formation and growth of the baby. Do not use any products that contain nicotine or tobacco, such as cigarettes and e-cigarettes. If you need help quitting, ask your health care provider. Visit your dentist if you have not gone yet during your pregnancy. Use a soft toothbrush to brush your teeth and be gentle when you floss. Contact a health care provider if: You have dizziness. You have mild pelvic cramps, pelvic pressure, or nagging pain in the abdominal area. You  have persistent nausea, vomiting, or diarrhea. You have a bad smelling vaginal discharge. You have pain when you urinate. Get help right away if: You have a fever. You are leaking fluid from your vagina. You have spotting or bleeding from your vagina. You have severe abdominal cramping or pain. You have rapid weight gain or weight loss. You have shortness of breath with chest pain. You notice sudden or extreme swelling of your face, hands, ankles, feet, or legs. You have not felt your baby move in over an hour. You have severe headaches that do not go away when you take medicine. You have vision changes. Summary The second trimester is from week 14 through week 27 (months 4 through 6). It is also a time when the fetus is growing rapidly. Your body goes through many changes during pregnancy. The changes vary from woman to woman. Avoid all smoking, herbs, alcohol, and unprescribed drugs. These chemicals affect the formation and growth your baby. Do not use any tobacco products, such as cigarettes, chewing tobacco, and e-cigarettes. If you need help quitting, ask your health care provider. Contact your health care provider if you have any questions. Keep all prenatal visits as told by your health care provider. This is important. This information is not intended to replace advice given to you by your health care provider. Make sure you discuss any questions you have with your health care provider. Document Released: 04/27/2001 Document Revised: 10/09/2015 Document Reviewed: 07/04/2012 Elsevier Interactive Patient Education  2017 [employer].

## 2023-10-18 NOTE — Progress Notes (Signed)
 LOW-RISK PREGNANCY VISIT Patient name: Patricia Richards MRN [id number]  Date of birth: [birth year] Chief Complaint:   Routine Prenatal Visit and Non-stress Test (2nd IT. When eat or drink something hight sugar feel like going pass out)  History of Present Illness:   Patricia Richards is a 33 y.o. [id number] female at [redacted]w[redacted]d with an Estimated Date of Delivery: 03/29/24 being seen today for ongoing management of a low-risk pregnancy.   Today she reports dizziness after high sugar foods. Contractions: Not present.  .  Movement: Absent. denies leaking of fluid.     09/20/2023    3:06 PM 04/07/2021    9:14 AM 02/13/2019    3:45 PM 07/26/2018   11:19 AM 03/02/2018    3:40 PM  Depression screen PHQ 2/9  Decreased Interest 2 1 0 2 1  Down, Depressed, Hopeless 2 1 0 2 1  PHQ - 2 Score 4 2 0 4 2  Altered sleeping 3 1   1   Tired, decreased energy 3 3   2   Change in appetite 3 2   0  Feeling bad or failure about yourself  3 2   1   Trouble concentrating 3 2   1   Moving slowly or fidgety/restless 3 3   0  Suicidal thoughts 0 0   0  PHQ-9 Score 22 15   7         09/20/2023    3:06 PM 04/07/2021    9:16 AM  GAD 7 : Generalized Anxiety Score  Nervous, Anxious, on Edge 3 3  Control/stop worrying 3 3  Worry too much - different things 3 3  Trouble relaxing 3 2  Restless 3 2  Easily annoyed or irritable 3 3  Afraid - awful might happen 3 3  Total GAD 7 Score 21 19      Review of Systems:   Pertinent items are noted in HPI Denies abnormal vaginal discharge w/ itching/odor/irritation, headaches, visual changes, shortness of breath, chest pain, abdominal pain, severe nausea/vomiting, or problems with urination or bowel movements unless otherwise stated above. Pertinent History Reviewed:  Reviewed past medical,surgical, social, obstetrical and family history.  Reviewed problem list, medications and allergies. Physical Assessment:   Vitals:   10/18/23 1416  BP: 111/76  Pulse: (!) 102  Weight:  164 lb 6.4 oz (74.6 kg)  Body mass index is 28.22 kg/m.        Physical Examination:   General appearance: Well appearing, and in no distress  Mental status: Alert, oriented to person, place, and time  Skin: Warm & dry  Cardiovascular: Normal heart rate noted  Respiratory: Normal respiratory effort, no distress  Abdomen: Soft, gravid, nontender  Pelvic: Cervical exam deferred         Extremities: Edema: None  Fetal Status: Fetal Heart Rate (bpm): 153   Movement: Absent    Chaperone: N/A No results found for this or any previous visit (from the past 24 hours).  Assessment & Plan:  1) Low-risk pregnancy [id number] at [redacted]w[redacted]d with an Estimated Date of Delivery: 03/29/24   2) Dep/anx, on klonopin   3) Dizziness after high sugar foods> increase protein   4) GBS+ ASB last visit> urine cx poc today   Meds: No orders of the defined types were placed in this encounter.  Labs/procedures today: urine culture and 2nd IT  Plan:  Continue routine obstetrical care  Next visit: prefers will be in person for u/s    Reviewed: Preterm labor symptoms and  general obstetric precautions including but not limited to vaginal bleeding, contractions, leaking of fluid and fetal movement were reviewed in detail with the patient.  All questions were answered. Does have home bp cuff. Office bp cuff given: not applicable. Check bp weekly, let us  know if consistently >140 and/or >90.  Follow-up: Return for As scheduled.  Future Appointments  Date Time Provider Department Center  11/11/2023  3:00 PM [facility] - ECHO 1 OUTPATIENT [facility] [place] [facility]  11/16/2023  3:00 PM [facility] - FTOBGYN US  [facility]G None  11/16/2023  3:50 PM Ferd Householder, CNM [facility]TO[facility]YN  01/10/2024  1:30 PM Strader, Dimple Francis, PA-C [facility] [place] [facility]    Orders Placed This Encounter  Procedures   Urine Culture   US  OB Comp + 14 Wk   INTEGRATED 2   Ferd Householder CNM, [facility] 10/18/2023 2:54 PM

## 2023-10-21 LAB — INTEGRATED 2
AFP MoM: 0.77
Alpha-Fetoprotein: 24.7 ng/mL
Crown Rump Length: 65.6 mm
DIA MoM: 0.79
DIA Value: 117.1 pg/mL
Estriol, Unconjugated: 1.09 ng/mL
Gest. Age on Collection Date: 12.7 wk
Gestational Age: 16.7 wk
Maternal Age at EDD: 31 a
Nuchal Translucency (NT): 1.6 mm
Nuchal Translucency MoM: 0.91
Number of Fetuses: 1
PAPP-A MoM: 0.44
PAPP-A Value: 343.8 ng/mL
Sonographer ID#: 309760
Test Results:: NEGATIVE
Weight: 165 [lb_av]
Weight: 165 [lb_av]
hCG MoM: 0.36
hCG Value: 10.9 [IU]/mL
uE3 MoM: 0.93

## 2023-10-21 LAB — URINE CULTURE

## 2023-10-24 ENCOUNTER — Encounter: Payer: Self-pay | Admitting: Women's Health

## 2023-10-24 ENCOUNTER — Ambulatory Visit: Payer: Self-pay | Admitting: Women's Health

## 2023-10-24 DIAGNOSIS — Z3480 Encounter for supervision of other normal pregnancy, unspecified trimester: Secondary | ICD-10-CM

## 2023-10-24 DIAGNOSIS — R8271 Bacteriuria: Secondary | ICD-10-CM

## 2023-10-24 MED ORDER — NITROFURANTOIN MONOHYD MACRO 100 MG PO CAPS: 100.0000 mg | ORAL_CAPSULE | Freq: Two times a day (BID) | ORAL | 0 refills | Status: DC

## 2023-10-27 ENCOUNTER — Encounter: Payer: Self-pay | Admitting: Internal Medicine

## 2023-10-27 ENCOUNTER — Encounter: Payer: Self-pay | Admitting: Women's Health

## 2023-11-02 ENCOUNTER — Other Ambulatory Visit: Payer: Self-pay | Admitting: Women's Health

## 2023-11-02 ENCOUNTER — Encounter: Payer: Self-pay | Admitting: Obstetrics & Gynecology

## 2023-11-02 DIAGNOSIS — Z79899 Other long term (current) drug therapy: Secondary | ICD-10-CM

## 2023-11-02 DIAGNOSIS — F1991 Other psychoactive substance use, unspecified, in remission: Secondary | ICD-10-CM

## 2023-11-02 MED ORDER — CLONAZEPAM 1 MG PO TABS: ORAL_TABLET | ORAL | 0 refills | Status: DC

## 2023-11-03 DIAGNOSIS — R002 Palpitations: Secondary | ICD-10-CM | POA: Diagnosis not present

## 2023-11-04 NOTE — Telephone Encounter (Signed)
 Patient called in requesting to review monitor results again.

## 2023-11-07 ENCOUNTER — Encounter: Payer: Self-pay | Admitting: Women's Health

## 2023-11-10 ENCOUNTER — Encounter: Payer: MEDICAID | Admitting: Obstetrics & Gynecology

## 2023-11-11 ENCOUNTER — Ambulatory Visit (HOSPITAL_COMMUNITY)
Admission: RE | Admit: 2023-11-11 | Discharge: 2023-11-11 | Disposition: A | Discharge status: Home / Self Care | Payer: MEDICAID | Source: Ambulatory Visit | Attending: Physician Assistant

## 2023-11-11 ENCOUNTER — Ambulatory Visit (HOSPITAL_COMMUNITY): Admission: RE | Admit: 2023-11-11 | Payer: MEDICAID | Source: Ambulatory Visit

## 2023-11-11 DIAGNOSIS — R0602 Shortness of breath: Secondary | ICD-10-CM | POA: Insufficient documentation

## 2023-11-11 LAB — ECHOCARDIOGRAM COMPLETE
AR max vel: 2.59 cm2
AV Area VTI: 2.65 cm2
AV Area mean vel: 2.45 cm2
AV Mean grad: 5 mmHg
AV Peak grad: 6.7 mmHg
Ao pk vel: 1.29 m/s
Area-P 1/2: 5.2 cm2
S' Lateral: 3 cm

## 2023-11-12 ENCOUNTER — Encounter: Payer: Self-pay | Admitting: Women's Health

## 2023-11-14 ENCOUNTER — Telehealth: Payer: Self-pay | Admitting: Physician Assistant

## 2023-11-14 NOTE — Telephone Encounter (Signed)
   Called and discussed ECHO results with patient. Overall, echo was normal and very reassuring. Patient is aware and understands.    Signed, Lorette CINDERELLA Kapur, PA-C 11/14/2023, 1:32 PM Pager: [phone]

## 2023-11-15 ENCOUNTER — Other Ambulatory Visit (HOSPITAL_COMMUNITY): Payer: MEDICAID

## 2023-11-16 ENCOUNTER — Other Ambulatory Visit: Payer: Self-pay | Admitting: Women's Health

## 2023-11-16 ENCOUNTER — Ambulatory Visit (INDEPENDENT_AMBULATORY_CARE_PROVIDER_SITE_OTHER): Payer: MEDICAID | Admitting: Women's Health

## 2023-11-16 ENCOUNTER — Encounter: Payer: Self-pay | Admitting: Women's Health

## 2023-11-16 ENCOUNTER — Other Ambulatory Visit (HOSPITAL_COMMUNITY)
Admission: RE | Admit: 2023-11-16 | Discharge: 2023-11-16 | Disposition: A | Discharge status: Home / Self Care | Payer: MEDICAID | Source: Ambulatory Visit | Attending: Women's Health | Admitting: Women's Health

## 2023-11-16 ENCOUNTER — Ambulatory Visit: Payer: MEDICAID

## 2023-11-16 VITALS — BP 114/77 | HR 98 | Wt 170.0 lb

## 2023-11-16 DIAGNOSIS — O26892 Other specified pregnancy related conditions, second trimester: Secondary | ICD-10-CM

## 2023-11-16 DIAGNOSIS — Z5189 Encounter for other specified aftercare: Secondary | ICD-10-CM

## 2023-11-16 DIAGNOSIS — N898 Other specified noninflammatory disorders of vagina: Secondary | ICD-10-CM

## 2023-11-16 DIAGNOSIS — Z79899 Other long term (current) drug therapy: Secondary | ICD-10-CM

## 2023-11-16 DIAGNOSIS — Z3482 Encounter for supervision of other normal pregnancy, second trimester: Secondary | ICD-10-CM | POA: Insufficient documentation

## 2023-11-16 DIAGNOSIS — Z3A20 20 weeks gestation of pregnancy: Secondary | ICD-10-CM

## 2023-11-16 DIAGNOSIS — O99820 Streptococcus B carrier state complicating pregnancy: Secondary | ICD-10-CM | POA: Diagnosis not present

## 2023-11-16 DIAGNOSIS — O99342 Other mental disorders complicating pregnancy, second trimester: Secondary | ICD-10-CM | POA: Diagnosis not present

## 2023-11-16 DIAGNOSIS — Z3480 Encounter for supervision of other normal pregnancy, unspecified trimester: Secondary | ICD-10-CM

## 2023-11-16 DIAGNOSIS — Z363 Encounter for antenatal screening for malformations: Secondary | ICD-10-CM

## 2023-11-16 DIAGNOSIS — R8271 Bacteriuria: Secondary | ICD-10-CM

## 2023-11-16 DIAGNOSIS — R42 Dizziness and giddiness: Secondary | ICD-10-CM

## 2023-11-16 DIAGNOSIS — F1991 Other psychoactive substance use, unspecified, in remission: Secondary | ICD-10-CM

## 2023-11-16 MED ORDER — CLONAZEPAM 1 MG PO TABS: ORAL_TABLET | ORAL | 0 refills | Status: DC

## 2023-11-16 NOTE — Progress Notes (Signed)
 LOW-RISK PREGNANCY VISIT Patient name: Patricia Richards MRN [id number]  Date of birth: Jun 02, 1990 Chief Complaint:   Routine Prenatal Visit and Pregnancy Ultrasound (Seeing spots. Urine culture)  History of Present Illness:   Patricia Richards is a 33 y.o. [id number] female at [redacted]w[redacted]d with an Estimated Date of Delivery: 03/29/24 being seen today for ongoing management of a low-risk pregnancy.   Today she reports continued dizziness, headaches, seeing spots, concerned about anemia/need for IV Fe. Has had appt w/ cardiology, all wnl. Normal echo 6/27. Vaginal d/c, occ itching. Last klonopin  refill was only for 60 instead of 90 (takes TID). Contractions: Not present.  .  Movement: Present. denies leaking of fluid.     09/20/2023    3:06 PM 04/07/2021    9:14 AM 02/13/2019    3:45 PM 07/26/2018   11:19 AM 03/02/2018    3:40 PM  Depression screen PHQ 2/9  Decreased Interest 2 1 0 2 1  Down, Depressed, Hopeless 2 1 0 2 1  PHQ - 2 Score 4 2 0 4 2  Altered sleeping 3 1   1   Tired, decreased energy 3 3   2   Change in appetite 3 2   0  Feeling bad or failure about yourself  3 2   1   Trouble concentrating 3 2   1   Moving slowly or fidgety/restless 3 3   0  Suicidal thoughts 0 0   0  PHQ-9 Score 22 15   7         09/20/2023    3:06 PM 04/07/2021    9:16 AM  GAD 7 : Generalized Anxiety Score  Nervous, Anxious, on Edge 3 3  Control/stop worrying 3 3  Worry too much - different things 3 3  Trouble relaxing 3 2  Restless 3 2  Easily annoyed or irritable 3 3  Afraid - awful might happen 3 3  Total GAD 7 Score 21 19      Review of Systems:   Pertinent items are noted in HPI Denies abnormal vaginal discharge w/ itching/odor/irritation, headaches, visual changes, shortness of breath, chest pain, abdominal pain, severe nausea/vomiting, or problems with urination or bowel movements unless otherwise stated above. Pertinent History Reviewed:  Reviewed past medical,surgical, social, obstetrical and  family history.  Reviewed problem list, medications and allergies. Physical Assessment:   Vitals:   11/16/23 1547  BP: 114/77  Pulse: 98  Weight: 170 lb (77.1 kg)  Body mass index is 29.18 kg/m.        Physical Examination:   General appearance: Well appearing, and in no distress  Mental status: Alert, oriented to person, place, and time  Skin: Warm & dry  Cardiovascular: Normal heart rate noted  Respiratory: Normal respiratory effort, no distress  Abdomen: Soft, gravid, nontender  Pelvic: Cervical exam deferred, self collected CV swab         Extremities: Edema: None  Fetal Status:     Movement: Present   US  20+6 wks,breech,fundal placenta gr 0,normal ovaries,FHR 138 bpm,SVP of fluid 6.2 cm,CX 4.7 cm,EFW 449 g 88%,anatomy complete,no obvious abnormalities   Chaperone: N/A No results found for this or any previous visit (from the past 24 hours).  Assessment & Plan:  1) Low-risk pregnancy [id number] at [redacted]w[redacted]d with an Estimated Date of Delivery: 03/29/24   2) Dizziness, seeing spots, headaches, eats chicken in am-otherwise not a lot of protein. To try increasing protein, stay well hydrated. Gave printed dizzy info. Has antivert  but says  makes sx worse. Has seen cardiology and had neg workup including normal echo. Requests iron panel be redrawn, ordered.   3) Vaginal d/c w/ occ itching> self collected CV swab  4) Recent ASB> urine cx poc today  5) Anxiety w/ long term klonopin  use> takes 1mg  TID, on 6/18 I refilled but for some reason only send 60 instead of 90, additional 30 rx'd today   Meds:  Meds ordered this encounter  Medications   clonazePAM  (KLONOPIN ) 1 MG tablet    Sig: TAKE (1) TABLET BY MOUTH THREE TIMES DAILY    Dispense:  30 tablet    Refill:  0    Only rx'd 60 pills (instead of 90) on 6/18   Labs/procedures today: U/S, urine culture, and labs  Plan:  Continue routine obstetrical care  Next visit: prefers in person    Reviewed: Preterm labor symptoms and  general obstetric precautions including but not limited to vaginal bleeding, contractions, leaking of fluid and fetal movement were reviewed in detail with the patient.  All questions were answered. Does have home bp cuff. Office bp cuff given: not applicable. Check bp weekly, let us  know if consistently >140 and/or >90.  Follow-up: Return in about 4 weeks (around 12/14/2023) for LROB, MD, in person.  Future Appointments  Date Time Provider Department Center  12/14/2023  2:50 PM Kizzie Suzen SAUNDERS, [place] [facility]  01/10/2024  1:30 PM Johnson, Laymon HERO, PA-C [facility] [place] [facility]    Orders Placed This Encounter  Procedures   Urine Culture   CBC   Iron, TIBC and Ferritin Panel   Suzen SAUNDERS Kizzie CNM, [facility] 11/16/2023 4:44 PM

## 2023-11-16 NOTE — Progress Notes (Signed)
 US  20+6 wks,breech,fundal placenta gr 0,normal ovaries,FHR 138 bpm,SVP of fluid 6.2 cm,CX 4.7 cm,EFW 449 g 88%,anatomy complete,no obvious abnormalities

## 2023-11-16 NOTE — Patient Instructions (Addendum)
 Patricia Richards, thank you for choosing our office today! We appreciate the opportunity to meet your healthcare needs. You may receive a short survey by mail, e-mail, or through [employer]. If you are happy with your care we would appreciate if you could take just a few minutes to complete the survey questions. We read all of your comments and take your feedback very seriously. Thank you again for choosing our office.  Center for [employer] Team at [facility] at [facility] ([address] [place], [place] [address]) Entrance C, located off of [address] Free 24/7 valet parking   For Dizzy Spells:  This is usually related to either your blood sugar or your blood pressure dropping Make sure you are staying well hydrated and drinking enough water  so that your urine is clear Eat small frequent meals and snacks containing protein (meat, eggs, nuts, cheese) so that your blood sugar doesn't drop If you do get dizzy, sit/lay down and get you something to drink and a snack containing protein- you will usually start feeling better in 10-20 minutes   Go to [email or web address] to register for FREE online childbirth classes  Call the office [phone]) or go to [facility] if: You begin to severe cramping Your water  breaks.  Sometimes it is a big gush of fluid, sometimes it is just a trickle that keeps getting your panties wet or running down your legs You have vaginal bleeding.  It is normal to have a small amount of spotting if your cervix was checked.   [facility] Pediatricians/Family Doctors [facility]): [address] Meg Spina, [phone]           [facility]: [address], [phone]                [facility]): [address], [phone] (call to ask if accepting patients) [facility] Department: [address], [place], [phone]    [facility] Pediatricians/Family  Doctors [facility]): [phone] [address], [phone] [facility]: [address] [place], [phone] [facility]: [address], [phone]  [facility] Doctors  [facility]): [phone] [facility]: [address] [phone]   [facility] Doctors [facility]: [address] [phone]  [facility] Doctors  [facility]: [phone], [phone]  Home Blood Pressure Monitoring for Patients   Your provider has recommended that you check your blood pressure (BP) at least once a week at home. If you do not have a blood pressure cuff at home, one will be provided for you. Contact your provider if you have not received your monitor within 1 week.   Helpful Tips for Accurate Home Blood Pressure Checks  Don't smoke, exercise, or drink caffeine  30 minutes before checking your BP Use the restroom before checking your BP (a full bladder can raise your pressure) Relax in a comfortable upright chair Feet on the ground Left arm resting comfortably on a flat surface at the level of your heart Legs uncrossed Back supported Sit quietly and don't talk Place the cuff on your bare arm Adjust snuggly, so that only two fingertips can fit between your skin and the top of the cuff Check 2 readings separated by at least one minute Keep a log of your BP readings For a visual, please reference this diagram: http://ccnc.care/bpdiagram  Provider  Name: [facility]     Phone: [phone]  Zone 1: ALL CLEAR  Continue to monitor your symptoms:  BP reading is less than 140 (top number) or less than 90 (bottom number)  No right upper stomach pain No headaches or seeing spots No feeling nauseated or throwing up No swelling in face and hands  Zone 2: CAUTION Call your doctor's office for any of the following:  BP reading is greater than  140 (top number) or greater than 90 (bottom number)  Stomach pain under your ribs in the middle or right side Headaches or seeing spots Feeling nauseated or throwing up Swelling in face and hands  Zone 3: EMERGENCY  Seek immediate medical care if you have any of the following:  BP reading is greater than160 (top number) or greater than 110 (bottom number) Severe headaches not improving with Tylenol  Serious difficulty catching your breath Any worsening symptoms from Zone 2     Second Trimester of Pregnancy The second trimester is from week 14 through week 27 (months 4 through 6). The second trimester is often a time when you feel your best. Your body has adjusted to being pregnant, and you begin to feel better physically. Usually, morning sickness has lessened or quit completely, you may have more energy, and you may have an increase in appetite. The second trimester is also a time when the fetus is growing rapidly. At the end of the sixth month, the fetus is about 9 inches long and weighs about 1 pounds. You will likely begin to feel the baby move (quickening) between 16 and 20 weeks of pregnancy. Body changes during your second trimester Your body continues to go through many changes during your second trimester. The changes vary from woman to woman. Your weight will continue to increase. You will notice your lower abdomen bulging out. You may begin to get stretch marks on your hips, abdomen, and breasts. You may develop headaches that can be relieved by medicines. The medicines should be approved by your health care provider. You may urinate more often because the fetus is pressing on your bladder. You may develop or continue to have heartburn as a result of your pregnancy. You may develop constipation because certain hormones are causing the muscles that push waste through your intestines to slow down. You may develop hemorrhoids or swollen, bulging veins (varicose veins). You may have  back pain. This is caused by: Weight gain. Pregnancy hormones that are relaxing the joints in your pelvis. A shift in weight and the muscles that support your balance. Your breasts will continue to grow and they will continue to become tender. Your gums may bleed and may be sensitive to brushing and flossing. Dark spots or blotches (chloasma, mask of pregnancy) may develop on your face. This will likely fade after the baby is born. A dark line from your belly button to the pubic area (linea nigra) may appear. This will likely fade after the baby is born. You may have changes in your hair. These can include thickening of your hair, rapid growth, and changes in texture. Some women also have hair loss during or after pregnancy, or hair that feels dry or thin. Your hair will most likely return to normal after your baby is born.  What to expect at prenatal visits During a routine prenatal visit: You will be weighed to make sure you and the fetus are growing normally. Your blood pressure will be taken. Your abdomen will be measured to  track your baby's growth. The fetal heartbeat will be listened to. Any test results from the previous visit will be discussed.  Your health care provider may ask you: How you are feeling. If you are feeling the baby move. If you have had any abnormal symptoms, such as leaking fluid, bleeding, severe headaches, or abdominal cramping. If you are using any tobacco products, including cigarettes, chewing tobacco, and electronic cigarettes. If you have any questions.  Other tests that may be performed during your second trimester include: Blood tests that check for: Low iron levels (anemia). High blood sugar that affects pregnant women (gestational diabetes) between 47 and 28 weeks. Rh antibodies. This is to check for a protein on red blood cells (Rh factor). Urine tests to check for infections, diabetes, or protein in the urine. An ultrasound to confirm the proper  growth and development of the baby. An amniocentesis to check for possible genetic problems. Fetal screens for spina bifida and Down syndrome. HIV (human immunodeficiency virus) testing. Routine prenatal testing includes screening for HIV, unless you choose not to have this test.  Follow these instructions at home: Medicines Follow your health care provider's instructions regarding medicine use. Specific medicines may be either safe or unsafe to take during pregnancy. Take a prenatal vitamin that contains at least 600 micrograms (mcg) of folic acid. If you develop constipation, try taking a stool softener if your health care provider approves. Eating and drinking Eat a balanced diet that includes fresh fruits and vegetables, whole grains, good sources of protein such as meat, eggs, or tofu, and low-fat dairy. Your health care provider will help you determine the amount of weight gain that is right for you. Avoid raw meat and uncooked cheese. These carry germs that can cause birth defects in the baby. If you have low calcium  intake from food, talk to your health care provider about whether you should take a daily calcium  supplement. Limit foods that are high in fat and processed sugars, such as fried and sweet foods. To prevent constipation: Drink enough fluid to keep your urine clear or pale yellow. Eat foods that are high in fiber, such as fresh fruits and vegetables, whole grains, and beans. Activity Exercise only as directed by your health care provider. Most women can continue their usual exercise routine during pregnancy. Try to exercise for 30 minutes at least 5 days a week. Stop exercising if you experience uterine contractions. Avoid heavy lifting, wear low heel shoes, and practice good posture. A sexual relationship may be continued unless your health care provider directs you otherwise. Relieving pain and discomfort Wear a good support bra to prevent discomfort from breast  tenderness. Take warm sitz baths to soothe any pain or discomfort caused by hemorrhoids. Use hemorrhoid cream if your health care provider approves. Rest with your legs elevated if you have leg cramps or low back pain. If you develop varicose veins, wear support hose. Elevate your feet for 15 minutes, 3-4 times a day. Limit salt in your diet. Prenatal Care Write down your questions. Take them to your prenatal visits. Keep all your prenatal visits as told by your health care provider. This is important. Safety Wear your seat belt at all times when driving. Make a list of emergency phone numbers, including numbers for family, friends, the hospital, and police and fire departments. General instructions Ask your health care provider for a referral to a local prenatal education class. Begin classes no later than the beginning of month 6 of your  pregnancy. Ask for help if you have counseling or nutritional needs during pregnancy. Your health care provider can offer advice or refer you to specialists for help with various needs. Do not use hot tubs, steam rooms, or saunas. Do not douche or use tampons or scented sanitary pads. Do not cross your legs for long periods of time. Avoid cat litter boxes and soil used by cats. These carry germs that can cause birth defects in the baby and possibly loss of the fetus by miscarriage or stillbirth. Avoid all smoking, herbs, alcohol, and unprescribed drugs. Chemicals in these products can affect the formation and growth of the baby. Do not use any products that contain nicotine or tobacco, such as cigarettes and e-cigarettes. If you need help quitting, ask your health care provider. Visit your dentist if you have not gone yet during your pregnancy. Use a soft toothbrush to brush your teeth and be gentle when you floss. Contact a health care provider if: You have dizziness. You have mild pelvic cramps, pelvic pressure, or nagging pain in the abdominal area. You  have persistent nausea, vomiting, or diarrhea. You have a bad smelling vaginal discharge. You have pain when you urinate. Get help right away if: You have a fever. You are leaking fluid from your vagina. You have spotting or bleeding from your vagina. You have severe abdominal cramping or pain. You have rapid weight gain or weight loss. You have shortness of breath with chest pain. You notice sudden or extreme swelling of your face, hands, ankles, feet, or legs. You have not felt your baby move in over an hour. You have severe headaches that do not go away when you take medicine. You have vision changes. Summary The second trimester is from week 14 through week 27 (months 4 through 6). It is also a time when the fetus is growing rapidly. Your body goes through many changes during pregnancy. The changes vary from woman to woman. Avoid all smoking, herbs, alcohol, and unprescribed drugs. These chemicals affect the formation and growth your baby. Do not use any tobacco products, such as cigarettes, chewing tobacco, and e-cigarettes. If you need help quitting, ask your health care provider. Contact your health care provider if you have any questions. Keep all prenatal visits as told by your health care provider. This is important. This information is not intended to replace advice given to you by your health care provider. Make sure you discuss any questions you have with your health care provider. Document Released: 04/27/2001 Document Revised: 10/09/2015 Document Reviewed: 07/04/2012 Elsevier Interactive Patient Education  2017 [employer].

## 2023-11-17 ENCOUNTER — Ambulatory Visit: Payer: Self-pay | Admitting: Women's Health

## 2023-11-17 LAB — CBC
Hematocrit: 36 % (ref 34.0–46.6)
Hemoglobin: 11.7 g/dL (ref 11.1–15.9)
MCH: 29.7 pg (ref 26.6–33.0)
MCHC: 32.5 g/dL (ref 31.5–35.7)
MCV: 91 fL (ref 79–97)
Platelets: 260 10*3/uL (ref 150–450)
RBC: 3.94 x10E6/uL (ref 3.77–5.28)
RDW: 13 % (ref 11.7–15.4)
WBC: 8.4 10*3/uL (ref 3.4–10.8)

## 2023-11-17 LAB — FERRITIN: Ferritin: 45 ng/mL (ref 15–150)

## 2023-11-17 LAB — IRON AND TIBC
Iron Saturation: 12 % — ABNORMAL LOW (ref 15–55)
Iron: 62 ug/dL (ref 27–159)
Total Iron Binding Capacity: 503 ug/dL — ABNORMAL HIGH (ref 250–450)
UIBC: 441 ug/dL — ABNORMAL HIGH (ref 131–425)

## 2023-11-18 LAB — URINE CULTURE

## 2023-11-21 ENCOUNTER — Ambulatory Visit: Payer: Self-pay | Admitting: Women's Health

## 2023-11-21 LAB — CERVICOVAGINAL ANCILLARY ONLY
Bacterial Vaginitis (gardnerella): NEGATIVE
Candida Glabrata: NEGATIVE
Candida Vaginitis: NEGATIVE
Chlamydia: NEGATIVE
Comment: NEGATIVE
Comment: NEGATIVE
Comment: NEGATIVE
Comment: NEGATIVE
Comment: NEGATIVE
Comment: NORMAL
Neisseria Gonorrhea: NEGATIVE
Trichomonas: NEGATIVE

## 2023-11-21 MED ORDER — FERROUS SULFATE 325 (65 FE) MG PO TABS: 325.0000 mg | ORAL_TABLET | ORAL | 6 refills | Status: DC

## 2023-12-06 ENCOUNTER — Other Ambulatory Visit: Payer: Self-pay | Admitting: Women's Health

## 2023-12-06 ENCOUNTER — Encounter: Payer: Self-pay | Admitting: Obstetrics & Gynecology

## 2023-12-06 ENCOUNTER — Encounter: Payer: Self-pay | Admitting: Women's Health

## 2023-12-08 ENCOUNTER — Other Ambulatory Visit: Payer: Self-pay | Admitting: Women's Health

## 2023-12-08 MED ORDER — CLONAZEPAM 1 MG PO TABS: ORAL_TABLET | ORAL | 0 refills | Status: DC

## 2023-12-14 ENCOUNTER — Encounter: Payer: MEDICAID | Admitting: Obstetrics & Gynecology

## 2023-12-14 ENCOUNTER — Encounter: Payer: Self-pay | Admitting: Women's Health

## 2023-12-14 ENCOUNTER — Encounter: Payer: MEDICAID | Admitting: Women's Health

## 2023-12-15 ENCOUNTER — Other Ambulatory Visit: Payer: Self-pay | Admitting: Women's Health

## 2023-12-15 MED ORDER — CLONAZEPAM 1 MG PO TABS: ORAL_TABLET | ORAL | 0 refills | Status: DC

## 2023-12-19 ENCOUNTER — Encounter: Payer: Self-pay | Admitting: Obstetrics & Gynecology

## 2023-12-21 ENCOUNTER — Encounter: Payer: Self-pay | Admitting: Obstetrics & Gynecology

## 2023-12-21 ENCOUNTER — Other Ambulatory Visit: Payer: Self-pay | Admitting: Obstetrics & Gynecology

## 2023-12-21 DIAGNOSIS — O219 Vomiting of pregnancy, unspecified: Secondary | ICD-10-CM

## 2023-12-21 MED ORDER — SCOPOLAMINE 1 MG/3DAYS TD PT72: 1.0000 | MEDICATED_PATCH | TRANSDERMAL | 0 refills | Status: AC

## 2023-12-21 NOTE — Progress Notes (Signed)
 Rx for scopolamine  patch as this too worked best for her nausea vomiting  Francina Beery, DO Attending Obstetrician & Gynecologist, Biochemist, clinical for [employer], [facility]

## 2023-12-22 ENCOUNTER — Encounter: Payer: Self-pay | Admitting: Obstetrics & Gynecology

## 2023-12-27 ENCOUNTER — Encounter: Payer: MEDICAID | Admitting: Women's Health

## 2023-12-28 ENCOUNTER — Encounter (HOSPITAL_COMMUNITY): Payer: Self-pay

## 2023-12-28 ENCOUNTER — Other Ambulatory Visit: Payer: Self-pay

## 2023-12-28 ENCOUNTER — Emergency Department (HOSPITAL_COMMUNITY)
Admission: EM | Admit: 2023-12-28 | Discharge: 2023-12-28 | Disposition: A | Discharge status: Home / Self Care | Payer: MEDICAID | Attending: Emergency Medicine | Admitting: Emergency Medicine

## 2023-12-28 DIAGNOSIS — O26892 Other specified pregnancy related conditions, second trimester: Secondary | ICD-10-CM | POA: Diagnosis present

## 2023-12-28 DIAGNOSIS — Z3A27 27 weeks gestation of pregnancy: Secondary | ICD-10-CM | POA: Diagnosis not present

## 2023-12-28 DIAGNOSIS — R42 Dizziness and giddiness: Secondary | ICD-10-CM | POA: Diagnosis not present

## 2023-12-28 LAB — COMPREHENSIVE METABOLIC PANEL WITH GFR
ALT: 13 U/L (ref 0–44)
AST: 16 U/L (ref 15–41)
Albumin: 3.4 g/dL — ABNORMAL LOW (ref 3.5–5.0)
Alkaline Phosphatase: 47 U/L (ref 38–126)
Anion gap: 11 (ref 5–15)
BUN: 8 mg/dL (ref 6–20)
CO2: 23 mmol/L (ref 22–32)
Calcium: 9.1 mg/dL (ref 8.9–10.3)
Chloride: 104 mmol/L (ref 98–111)
Creatinine, Ser: 0.44 mg/dL (ref 0.44–1.00)
GFR, Estimated: >60 mL/min (ref >60)
Glucose, Bld: 96 mg/dL (ref 70–99)
Potassium: 3.3 mmol/L — ABNORMAL LOW (ref 3.5–5.1)
Sodium: 138 mmol/L (ref 135–145)
Total Bilirubin: 0.4 mg/dL (ref 0.0–1.2)
Total Protein: 6.9 g/dL (ref 6.5–8.1)

## 2023-12-28 LAB — URINALYSIS, ROUTINE W REFLEX MICROSCOPIC
Bilirubin Urine: NEGATIVE
Glucose, UA: NEGATIVE mg/dL
Hgb urine dipstick: NEGATIVE
Ketones, ur: NEGATIVE mg/dL
Leukocytes,Ua: NEGATIVE
Nitrite: NEGATIVE
Protein, ur: NEGATIVE mg/dL
Specific Gravity, Urine: 1.009 (ref 1.005–1.030)
pH: 7 (ref 5.0–8.0)

## 2023-12-28 LAB — CBC
HCT: 35.2 % — ABNORMAL LOW (ref 36.0–46.0)
Hemoglobin: 11.4 g/dL — ABNORMAL LOW (ref 12.0–15.0)
MCH: 29.5 pg (ref 26.0–34.0)
MCHC: 32.4 g/dL (ref 30.0–36.0)
MCV: 91.2 fL (ref 80.0–100.0)
Platelets: 227 K/uL (ref 150–400)
RBC: 3.86 MIL/uL — ABNORMAL LOW (ref 3.87–5.11)
RDW: 13.9 % (ref 11.5–15.5)
WBC: 8.7 K/uL (ref 4.0–10.5)
nRBC: 0 % (ref 0.0–0.2)

## 2023-12-28 LAB — CBG MONITORING, ED: Glucose-Capillary: 96 mg/dL (ref 70–99)

## 2023-12-28 NOTE — ED Provider Notes (Signed)
 [place] EMERGENCY DEPARTMENT AT [facility] Provider Note   CSN: [id number] Arrival date & time: 12/28/23  1714     Patient presents with: Dizziness   Patricia Richards is a 33 y.o. female.  33 year old female presents to the ED with complaints of dizziness since May of this year.  Patient advised that she has had dizziness every single day since May.  Patient was given scopolamine  patch by her PCP without relief.  Her OB/GYN placed her on iron supplements and she discontinued because she thought they were making her dizziness worse.  Patient advises sometimes when she is driving she feels like the pain in her head is going to make her have a seizure.  Patient does not have diagnosed history of seizures.  She has been placed she is currently [redacted] weeks pregnant.  Patient has OB/GYN appointment tomorrow.    Prior to Admission medications   Medication Sig Start Date End Date Taking? Authorizing Provider  acetaminophen  (TYLENOL ) 325 MG tablet Take 2 tablets (650 mg total) by mouth every 4 (four) hours as needed for up to 180 doses (for pain scale < 4). 07/11/21   Gene Lucie BROCKS, CNM  amphetamine-dextroamphetamine (ADDERALL XR) 10 MG 24 hr capsule Take 10 mg by mouth every morning. Patient not taking: Reported on 11/16/2023 04/05/23   [provider]  Blood Pressure Monitor MISC For regular home bp monitoring during pregnancy 01/05/21   Kizzie Suzen SAUNDERS, CNM  citalopram (CELEXA) 40 MG tablet Take 40 mg by mouth daily. 10/21/23   [provider]  clonazePAM  (KLONOPIN ) 1 MG tablet TAKE (1) TABLET BY MOUTH THREE TIMES DAILY 12/15/23   Kizzie Suzen SAUNDERS, CNM  ferrous sulfate  325 (65 FE) MG tablet Take 1 tablet (325 mg total) by mouth every other day. 11/21/23   Kizzie Suzen SAUNDERS, CNM  lurasidone (LATUDA) 40 MG TABS tablet Take 40 mg by mouth daily. 10/21/23   [provider]  meclizine  (ANTIVERT ) 25 MG tablet Take 12.5 mg by mouth 3 (three) times daily as needed.  10/17/23   [provider]  Prenatal Vit-Fe Fumarate-FA (PRENATAL VITAMIN PO) Take by mouth.    [provider]  scopolamine  (TRANSDERM-SCOP) 1 MG/3DAYS Place 1 patch (1.5 mg total) onto the skin every 3 (three) days. 12/21/23 01/20/24  Ozan, Jennifer, DO    Allergies: Procardia  xl [nifedipine  er], Tramadol , Reglan [metoclopramide hcl], and Toradol  [ketorolac  tromethamine ]    Review of Systems  Neurological:  Positive for dizziness, light-headedness and headaches.  All other systems reviewed and are negative.   Updated Vital Signs BP 125/68 (BP Location: Right Arm)   Pulse (!) 106   Temp 97.9 F (36.6 C) (Oral)   Resp 17   Ht 5' 4 (1.626 m)   Wt 78.5 kg   LMP 06/17/2023 (Exact Date)   SpO2 99%   BMI 29.70 kg/m   Physical Exam Vitals and nursing note reviewed.  Constitutional:      General: She is not in acute distress.    Appearance: Normal appearance. She is not ill-appearing.  HENT:     Head: Normocephalic and atraumatic.  Eyes:     Extraocular Movements: Extraocular movements intact.     Conjunctiva/sclera: Conjunctivae normal.     Pupils: Pupils are equal, round, and reactive to light.  Cardiovascular:     Rate and Rhythm: Normal rate.  Pulmonary:     Effort: Pulmonary effort is normal. No respiratory distress.  Abdominal:     Tenderness: There  is no abdominal tenderness. There is no guarding.  Musculoskeletal:        General: Normal range of motion.     Cervical back: Normal range of motion.  Skin:    General: Skin is warm and dry.  Neurological:     General: No focal deficit present.     Mental Status: She is alert.     Motor: No weakness.  Psychiatric:        Mood and Affect: Mood normal.        Behavior: Behavior normal.     (all labs ordered are listed, but only abnormal results are displayed) Labs Reviewed  COMPREHENSIVE METABOLIC PANEL WITH GFR - Abnormal; Notable for the following components:      Result Value   Potassium 3.3 (*)     Albumin 3.4 (*)    All other components within normal limits  CBC - Abnormal; Notable for the following components:   RBC 3.86 (*)    Hemoglobin 11.4 (*)    HCT 35.2 (*)    All other components within normal limits  URINALYSIS, ROUTINE W REFLEX MICROSCOPIC  CBG MONITORING, ED    EKG: None  Radiology: No results found.   Procedures   Medications Ordered in the ED - No data to display  33 y.o. female presents to the ED for concern of Dizziness     This involves an extensive number of treatment options, and is a complaint that carries with it a high risk of complications and morbidity.  The differential diagnosis prior to evaluation includes, but is not limited to: Intracranial mass, migraine,  This is not an exhaustive differential.   Past Medical History / Co-morbidities / Social History: Hx of depression, alcohol abuse, SVT Social Determinants of Health include: Former tobacco smoker former alcohol former recreational drugs  Additional History:  Obtained by chart review.  Notably patient is being regularly for pregnancy by OB/GYN, cardiology for SVT, primary care for dizziness  Lab Tests: I ordered, and personally interpreted labs.  The pertinent results include: CMP CBC UA CBG.  No acute abnormalities.   Cardiac Monitoring: The patient was maintained on a cardiac monitor.  I personally viewed and interpreted the cardiac monitored which showed an underlying rhythm of: EKG was normal sinus rhythm.  Reviewed by attending.  ED Course / Critical Interventions: Pt nontoxic well-appearing on exam sitting in ED bed in no acute distress.  Patient advises that she has had intermittent dizziness since May and she was seen in the ED with a MRI head and cervical spine.  There were no abnormalities found at that time.  Patient is still reporting intermittent dizziness and some flashes seen in her vision.  Patient has been seen by PCP and prescribed scopolamine  patches with relief but  patient advised she felt she was having withdrawals after taking off.  She has been seen by her OB/GYN for being 27 months pregnant and prescribed iron supplement The patient discontinued herself.  No other complications during her pregnancy.  Patient denies any abdominal pain. Patient has no shortness of breath, chest pain, or focal deficits on neuroexam.  Previous MRI showed no acute abnormalities in the cervical spine or the brain.  Symptoms have been consistent since scan in May with no increase in severity.  EOM normal on exam and equal and reactive to light.    Upon reevaluation, after discussion with attending it was advised for her to follow-up with neurology and was given neurology referral.  Patient  has no obvious signs of neurodeficits on exam and denies any New symptoms on reevaluation.  Patient agreed with treatment plan and was discharged I have reviewed the patients home medicines and have made adjustments as needed.  Disposition: Considered admission and after reviewing the patient's encounter today, I feel that the patient would benefit from follow-up with neurology and continue seeing OB/GYN for current pregnancy.  Discussed course of treatment with the patient, whom demonstrated understanding.  Patient in agreement and has no further questions.    I discussed this case with my attending, Dr. Cleotilde, who agreed with the proposed treatment course and cosigned this note including patient's presenting symptoms, physical exam, and planned diagnostics and interventions.  Attending physician stated agreement with plan or made changes to plan which were implemented.     This chart was dictated using voice recognition software.  Despite best efforts to proofread, errors can occur which can change the documentation meaning.    Final diagnoses:  Dizziness    ED Discharge Orders          Ordered    Ambulatory referral to Neurology       Comments: An appointment is requested in  approximately: 1 week   12/28/23 1931               Myriam Fonda GORMAN DEVONNA 12/28/23 2230    Cleotilde Rogue, MD 12/29/23 1320

## 2023-12-28 NOTE — ED Triage Notes (Signed)
 Pt reports she has been dizzy everyday since May when she was seen here for same and she is [redacted] weeks pregnant.  Pt reports her obgyn thinks its her iron but the iron supplements make her dizziness worse.  Pt reports she is taking fluids adequately.  Pt reports [facility] hospital gave her a scopolamine  patch and it did work but she felt like she was having withdraw when she took it off so she was afraid to use it again.

## 2023-12-28 NOTE — Discharge Instructions (Addendum)
 Follow-up with [facility] [phone].  Continue care with OB/GYN.  Treat pain at home with Tylenol  as needed until further recommendation with neurology.  Return to ED for worsening symptoms or if you pass out, visual loss, or have severe weakness.

## 2023-12-29 ENCOUNTER — Other Ambulatory Visit: Payer: MEDICAID

## 2023-12-29 ENCOUNTER — Ambulatory Visit: Payer: MEDICAID | Admitting: Obstetrics & Gynecology

## 2023-12-29 VITALS — BP 117/75 | HR 99 | Wt 176.0 lb

## 2023-12-29 DIAGNOSIS — Z3A27 27 weeks gestation of pregnancy: Secondary | ICD-10-CM

## 2023-12-29 DIAGNOSIS — R42 Dizziness and giddiness: Secondary | ICD-10-CM

## 2023-12-29 DIAGNOSIS — O0992 Supervision of high risk pregnancy, unspecified, second trimester: Secondary | ICD-10-CM | POA: Diagnosis not present

## 2023-12-29 DIAGNOSIS — K219 Gastro-esophageal reflux disease without esophagitis: Secondary | ICD-10-CM

## 2023-12-29 DIAGNOSIS — Z131 Encounter for screening for diabetes mellitus: Secondary | ICD-10-CM

## 2023-12-29 DIAGNOSIS — Z23 Encounter for immunization: Secondary | ICD-10-CM

## 2023-12-29 DIAGNOSIS — Z3480 Encounter for supervision of other normal pregnancy, unspecified trimester: Secondary | ICD-10-CM

## 2023-12-29 DIAGNOSIS — Z79899 Other long term (current) drug therapy: Secondary | ICD-10-CM

## 2023-12-29 MED ORDER — CLONAZEPAM 1 MG PO TABS: ORAL_TABLET | ORAL | 0 refills | Status: DC

## 2023-12-29 MED ORDER — PANTOPRAZOLE SODIUM 20 MG PO TBEC: 20.0000 mg | DELAYED_RELEASE_TABLET | Freq: Every day | ORAL | 2 refills | Status: DC

## 2023-12-29 NOTE — Progress Notes (Signed)
 HIGH-RISK PREGNANCY VISIT Patient name: Patricia Richards MRN [id number]  Date of birth: 1990-07-19 Chief Complaint:   Routine Prenatal Visit  History of Present Illness:   Patricia Richards is a 33 y.o. [id number] female at [redacted]w[redacted]d with an Estimated Date of Delivery: 03/29/24 being seen today for ongoing management of a high-risk pregnancy complicated by     ICD-10-CM   1. Supervision of high risk pregnancy in second trimester  O09.92     2. Long term prescription benzodiazepine use  Z79.899      .    Today she reports no complaints. Contractions: Not present. Vag. Bleeding: None.  Movement: Present. denies leaking of fluid.      09/20/2023    3:06 PM 04/07/2021    9:14 AM 02/13/2019    3:45 PM 07/26/2018   11:19 AM 03/02/2018    3:40 PM  Depression screen PHQ 2/9  Decreased Interest 2 1 0 2 1  Down, Depressed, Hopeless 2 1 0 2 1  PHQ - 2 Score 4 2 0 4 2  Altered sleeping 3 1   1   Tired, decreased energy 3 3   2   Change in appetite 3 2   0  Feeling bad or failure about yourself  3 2   1   Trouble concentrating 3 2   1   Moving slowly or fidgety/restless 3 3   0  Suicidal thoughts 0 0   0  PHQ-9 Score 22 15   7         09/20/2023    3:06 PM 04/07/2021    9:16 AM  GAD 7 : Generalized Anxiety Score  Nervous, Anxious, on Edge 3 3  Control/stop worrying 3 3  Worry too much - different things 3 3  Trouble relaxing 3 2  Restless 3 2  Easily annoyed or irritable 3 3  Afraid - awful might happen 3 3  Total GAD 7 Score 21 19     Review of Systems:   Pertinent items are noted in HPI Denies abnormal vaginal discharge w/ itching/odor/irritation, headaches, visual changes, shortness of breath, chest pain, abdominal pain, severe nausea/vomiting, or problems with urination or bowel movements unless otherwise stated above. Pertinent History Reviewed:  Reviewed past medical,surgical, social, obstetrical and family history.  Reviewed problem list, medications and allergies. Physical  Assessment:   Vitals:   12/29/23 0834  BP: 117/75  Pulse: 99  Weight: 176 lb (79.8 kg)  Body mass index is 30.21 kg/m.           Physical Examination:   General appearance: alert, well appearing, and in no distress  Mental status: alert, oriented to person, place, and time  Skin: warm & dry   Extremities:      Cardiovascular: normal heart rate noted  Respiratory: normal respiratory effort, no distress  Abdomen: gravid, soft, non-tender  Pelvic: Cervical exam deferred         Fetal Status:     Movement: Present    Fetal Surveillance Testing today: FHR 146   Chaperone:     Results for orders placed or performed during the hospital encounter of 12/28/23 (from the past 24 hours)  Comprehensive metabolic panel   Collection Time: 12/28/23  6:10 PM  Result Value Ref Range   Sodium 138 135 - 145 mmol/L   Potassium 3.3 (L) 3.5 - 5.1 mmol/L   Chloride 104 98 - 111 mmol/L   CO2 23 22 - 32 mmol/L   Glucose, Bld 96 70 -  99 mg/dL   BUN 8 6 - 20 mg/dL   Creatinine, Ser 9.55 0.44 - 1.00 mg/dL   Calcium  9.1 8.9 - 10.3 mg/dL   Total Protein 6.9 6.5 - 8.1 g/dL   Albumin 3.4 (L) 3.5 - 5.0 g/dL   AST 16 15 - 41 U/L   ALT 13 0 - 44 U/L   Alkaline Phosphatase 47 38 - 126 U/L   Total Bilirubin 0.4 0.0 - 1.2 mg/dL   GFR, Estimated >39 >39 mL/min   Anion gap 11 5 - 15  CBC   Collection Time: 12/28/23  6:10 PM  Result Value Ref Range   WBC 8.7 4.0 - 10.5 K/uL   RBC 3.86 (L) 3.87 - 5.11 MIL/uL   Hemoglobin 11.4 (L) 12.0 - 15.0 g/dL   HCT 64.7 (L) 63.9 - 53.9 %   MCV 91.2 80.0 - 100.0 fL   MCH 29.5 26.0 - 34.0 pg   MCHC 32.4 30.0 - 36.0 g/dL   RDW 86.0 88.4 - 84.4 %   Platelets 227 150 - 400 K/uL   nRBC 0.0 0.0 - 0.2 %  CBG monitoring, ED   Collection Time: 12/28/23  6:41 PM  Result Value Ref Range   Glucose-Capillary 96 70 - 99 mg/dL  Urinalysis, Routine w reflex microscopic -Urine, Clean Catch   Collection Time: 12/28/23  6:42 PM  Result Value Ref Range   Color, Urine YELLOW  YELLOW   APPearance CLEAR CLEAR   Specific Gravity, Urine 1.009 1.005 - 1.030   pH 7.0 5.0 - 8.0   Glucose, UA NEGATIVE NEGATIVE mg/dL   Hgb urine dipstick NEGATIVE NEGATIVE   Bilirubin Urine NEGATIVE NEGATIVE   Ketones, ur NEGATIVE NEGATIVE mg/dL   Protein, ur NEGATIVE NEGATIVE mg/dL   Nitrite NEGATIVE NEGATIVE   Leukocytes,Ua NEGATIVE NEGATIVE    Assessment & Plan:  High-risk pregnancy: [id number] at [redacted]w[redacted]d with an Estimated Date of Delivery: 03/29/24      ICD-10-CM   1. Supervision of high risk pregnancy in second trimester  O09.92     2. Long term prescription benzodiazepine use  Z79.899          Meds: No orders of the defined types were placed in this encounter.   Orders:  Orders Placed This Encounter  Procedures   Tdap vaccine greater than or equal to 7yo IM     Labs/procedures today: PN2  Treatment Plan:  PT consult, for vestibular evaluation    Follow-up: No follow-ups on file.   Future Appointments  Date Time Provider Department Center  01/10/2024  1:30 PM Johnson, Laymon HERO, PA-C [facility]    Orders Placed This Encounter  Procedures   Tdap vaccine greater than or equal to 7yo IM   Orli Degrave H Ady Heimann  Attending Physician for the Center for [facility] 12/29/2023 9:00 AM

## 2023-12-30 LAB — CBC
Hematocrit: 33.7 % — ABNORMAL LOW (ref 34.0–46.6)
Hemoglobin: 11.1 g/dL (ref 11.1–15.9)
MCH: 30.1 pg (ref 26.6–33.0)
MCHC: 32.9 g/dL (ref 31.5–35.7)
MCV: 91 fL (ref 79–97)
Platelets: 235 x10E3/uL (ref 150–450)
RBC: 3.69 x10E6/uL — ABNORMAL LOW (ref 3.77–5.28)
RDW: 13.1 % (ref 11.7–15.4)
WBC: 8 x10E3/uL (ref 3.4–10.8)

## 2023-12-30 LAB — ANTIBODY SCREEN: Antibody Screen: NEGATIVE

## 2023-12-30 LAB — GLUCOSE TOLERANCE, 2 HOURS W/ 1HR
Glucose, 1 hour: 147 mg/dL (ref 70–179)
Glucose, 2 hour: 122 mg/dL (ref 70–152)
Glucose, Fasting: 79 mg/dL (ref 70–91)

## 2023-12-30 LAB — HIV ANTIBODY (ROUTINE TESTING W REFLEX): HIV Screen 4th Generation wRfx: NONREACTIVE

## 2023-12-30 LAB — RPR: RPR Ser Ql: NONREACTIVE

## 2024-01-04 ENCOUNTER — Encounter: Payer: Self-pay | Admitting: Advanced Practice Midwife

## 2024-01-05 MED ORDER — SUCRALFATE 1 G PO TABS: 1.0000 g | ORAL_TABLET | Freq: Four times a day (QID) | ORAL | 1 refills | Status: DC

## 2024-01-10 ENCOUNTER — Ambulatory Visit: Payer: MEDICAID | Attending: Student | Admitting: Student

## 2024-01-10 ENCOUNTER — Encounter: Payer: Self-pay | Admitting: Student

## 2024-01-10 VITALS — BP 120/76 | HR 98 | Ht 64.0 in | Wt 176.8 lb

## 2024-01-10 DIAGNOSIS — R42 Dizziness and giddiness: Secondary | ICD-10-CM | POA: Diagnosis present

## 2024-01-10 DIAGNOSIS — R002 Palpitations: Secondary | ICD-10-CM | POA: Diagnosis not present

## 2024-01-10 NOTE — Progress Notes (Signed)
 Cardiology Office Note    Date:  01/10/2024  ID:  Chea, Malan 1990/08/04, MRN [id number] Cardiologist: Vina Gull, MD Cardiology APP:  Johnson Laymon HERO, PA-C { : History of Present Illness:    Patricia Richards is a 33 y.o. female with past medical history of palpitations (PAC's and PVC's by prior monitor), bipolar disorder, panic attacks and prior substance use who presents to the office today for 58-month follow-up.  She was examined by Dena Kapur, PA in 09/2023 and was [redacted] weeks pregnant at that time and reported palpitations which would last for less than a minute and spontaneously resolve. Also reported episodes of chest pain over the past month which could last from hours to days along with intermittent dizziness with changing positions. A 14-day Zio patch was recommended for further assessment along with an echocardiogram. Options of a stress test were reviewed but was recommended to hold off on this given her pregnancy and overall atypical chest pain symptoms.  She did have an echocardiogram in 10/2023 which showed a preserved EF of 60 to 65% with no wall motion abnormalities. Right ventricular function was normal and she did not have any significant valve abnormalities. Her monitor showed normal sinus rhythm with an average heart rate of 93 bpm. She did have rare PAC's and PVC's but no significant arrhythmias.  In talking with the patient and one of her daughters today, she reports that her dizziness has improved over the past week. She feels like this was possibly just due to second trimester symptoms. Reports still having occasional episodes of chest discomfort but reports this is mostly along her upper rib cage and bilateral. Occurs at rest or with activity. No specific orthopnea, PND or pitting edema. Still has occasional palpitations which she describes as a skipped beat.  She mostly consumes water  throughout the day and has increased her intake to at least 4-5 bottles a  day. No caffeine  intake and she denies any recurrent alcohol use since 05/2023.  Studies Reviewed:   EKG: EKG is not ordered today.   Echocardiogram: 10/2023 IMPRESSIONS     1. Left ventricular ejection fraction, by estimation, is 60 to 65%. The  left ventricle has normal function. The left ventricle has no regional  wall motion abnormalities. Left ventricular diastolic parameters were  normal.   2. Right ventricular systolic function is normal. The right ventricular  size is normal.   3. The mitral valve is normal in structure. No evidence of mitral valve  regurgitation. No evidence of mitral stenosis.   4. The aortic valve has an indeterminant number of cusps. Aortic valve  regurgitation is not visualized. No aortic stenosis is present.   5. The inferior vena cava is normal in size with greater than 50%  respiratory variability, suggesting right atrial pressure of 3 mmHg.    Event Monitor: 10/2023 Patch Wear Time:  13 days and 16 hours (2025-05-21T14:29:58-0400 to 2025-06-04T06:53:46-0400)   Impression: Sinus rhythm   Rates 63 to 157 bpm   Average HR 93 bpm   Rare PAC, rare PVC Triggered event correlated with sinus rhythm, sinus rhythm with PAC   Physical Exam:   VS:  BP 120/76 (BP Location: Left Arm, Cuff Size: Normal)   Pulse 98   Ht 5' 4 (1.626 m)   Wt 176 lb 12.8 oz (80.2 kg)   LMP 06/17/2023 (Exact Date)   SpO2 98%   BMI 30.35 kg/m    Wt Readings from Last 3 Encounters:  01/10/24  176 lb 12.8 oz (80.2 kg)  12/29/23 176 lb (79.8 kg)  12/28/23 173 lb (78.5 kg)     GEN: Well nourished, well developed female appearing in no acute distress. NECK: No JVD; No carotid bruits CARDIAC: RRR, no murmurs, rubs, gallops RESPIRATORY:  Clear to auscultation without rales, wheezing or rhonchi  ABDOMEN: Currently pregnant.  EXTREMITIES: No clubbing or cyanosis. No pitting edema.  Distal pedal pulses are 2+ bilaterally.   Assessment and Plan:   1. Palpitations - She  has a history of palpitations but previously exacerbated earlier this year in the setting of pregnancy. Reviewed monitor and echocardiogram results in detail with the patient today as both were overall very reassuring as her monitor only showed rare PAC's and PVC's and echocardiogram showed no structural abnormalities. - Would overall continue with conservative management given no significant arrhythmias. Was encouraged to continue with adequate hydration throughout pregnancy.  2. Dizziness - She reports symptoms have improved over the past few weeks. Has previously scheduled follow-up with Neurology later this year for further evaluation.  Signed, Laymon CHRISTELLA Qua, PA-C

## 2024-01-10 NOTE — Patient Instructions (Signed)
 Medication Instructions:  Your physician recommends that you continue on your current medications as directed. Please refer to the Current Medication list given to you today.  *If you need a refill on your cardiac medications before your next appointment, please call your pharmacy*  Lab Work: NONE   If you have labs (blood work) drawn today and your tests are completely normal, you will receive your results only by: MyChart Message (if you have MyChart) OR A paper copy in the mail If you have any lab test that is abnormal or we need to change your treatment, we will call you to review the results.  Testing/Procedures: NONE   Follow-Up: At [facility], you and your health needs are our priority.  As part of our continuing mission to provide you with exceptional heart care, our providers are all part of one team.  This team includes your primary Cardiologist (physician) and Advanced Practice Providers or APPs (Physician Assistants and Nurse Practitioners) who all work together to provide you with the care you need, when you need it.  Your next appointment:   6 month(s)  Provider:   You may see Vina Gull, MD or one of the following Advanced Practice Providers on your designated Care Team:   Laymon Qua, PA-C  [place], [place] Olivia Pavy, [place]     We recommend signing up for the patient portal called MyChart.  Sign up information is provided on this After Visit Summary.  MyChart is used to connect with patients for Virtual Visits (Telemedicine).  Patients are able to view lab/test results, encounter notes, upcoming appointments, etc.  Non-urgent messages can be sent to your provider as well.   To learn more about what you can do with MyChart, go to [email or web address].   Other Instructions Thank you for choosing [facility]!

## 2024-01-12 ENCOUNTER — Other Ambulatory Visit (HOSPITAL_COMMUNITY)
Admission: RE | Admit: 2024-01-12 | Discharge: 2024-01-12 | Disposition: A | Discharge status: Home / Self Care | Payer: MEDICAID | Source: Ambulatory Visit | Attending: Advanced Practice Midwife | Admitting: Advanced Practice Midwife

## 2024-01-12 ENCOUNTER — Ambulatory Visit (INDEPENDENT_AMBULATORY_CARE_PROVIDER_SITE_OTHER): Payer: MEDICAID | Admitting: Advanced Practice Midwife

## 2024-01-12 VITALS — BP 113/71 | HR 66 | Wt 178.0 lb

## 2024-01-12 DIAGNOSIS — Z3A29 29 weeks gestation of pregnancy: Secondary | ICD-10-CM

## 2024-01-12 DIAGNOSIS — O26893 Other specified pregnancy related conditions, third trimester: Secondary | ICD-10-CM | POA: Diagnosis not present

## 2024-01-12 DIAGNOSIS — R790 Abnormal level of blood mineral: Secondary | ICD-10-CM

## 2024-01-12 DIAGNOSIS — N898 Other specified noninflammatory disorders of vagina: Secondary | ICD-10-CM | POA: Diagnosis present

## 2024-01-12 DIAGNOSIS — Z6791 Unspecified blood type, Rh negative: Secondary | ICD-10-CM

## 2024-01-12 DIAGNOSIS — Z3480 Encounter for supervision of other normal pregnancy, unspecified trimester: Secondary | ICD-10-CM

## 2024-01-12 MED ORDER — CLONAZEPAM 1 MG PO TABS: ORAL_TABLET | ORAL | 0 refills | Status: DC

## 2024-01-12 NOTE — Progress Notes (Signed)
   LOW-RISK PREGNANCY VISIT Patient name: Patricia Richards MRN [id number]  Date of birth: [birth year] Chief Complaint:   Routine Prenatal Visit  History of Present Illness:   Patricia Richards is a 33 y.o. [id number] female at [redacted]w[redacted]d with an Estimated Date of Delivery: 03/29/24 being seen today for ongoing management of a low-risk pregnancy.  Today she reports {sx:14538}. Contractions: Not present. Vag. Bleeding: None.  Movement: Present. {Actions; denies-reports:120008} leaking of fluid. Review of Systems:   Pertinent items are noted in HPI Denies abnormal vaginal discharge w/ itching/odor/irritation, headaches, visual changes, shortness of breath, chest pain, abdominal pain, severe nausea/vomiting, or problems with urination or bowel movements unless otherwise stated above. Pertinent History Reviewed:  Reviewed past medical,surgical, social, obstetrical and family history.  Reviewed problem list, medications and allergies. Physical Assessment:   Vitals:   01/12/24 1556  BP: 113/71  Pulse: 66  Weight: 178 lb (80.7 kg)  Body mass index is 30.55 kg/m.        Physical Examination:   General appearance: Well appearing, and in no distress  Mental status: Alert, oriented to person, place, and time  Skin: Warm & dry  Cardiovascular: Normal heart rate noted  Respiratory: Normal respiratory effort, no distress  Abdomen: Soft, gravid, nontender  Pelvic: {Blank single:19197::Cervical exam performed,Cervical exam deferred}         Extremities:   Chaperone:  {Chaperone:19197::N/A,pt declined,Latisha Cresenzo,Janet Young,Amanda Andrews,Peggy Dones,Sherry, RN}   Fetal Status:     Movement: Present      No results found for this or any previous visit (from the past 24 hours).  Assessment & Plan:    Pregnancy: [id number] at [redacted]w[redacted]d 1. Vaginal itching (Primary) *** - Cervicovaginal ancillary only  2. Supervision of other normal pregnancy, antepartum ***  3. [redacted] weeks gestation  of pregnancy ***     Meds: No orders of the defined types were placed in this encounter.  Labs/procedures today: ***  Plan:  Continue routine obstetrical care *** Next visit: prefers {Blank single:19197::in person,online,will be in person for ***}    Reviewed: {Blank single:19197::Term,Preterm} labor symptoms and general obstetric precautions including but not limited to vaginal bleeding, contractions, leaking of fluid and fetal movement were reviewed in detail with the patient.  All questions were answered. *** home bp cuff. Rx faxed to ***. Check bp weekly, let us  know if >140/90.   Follow-up: No follow-ups on file.  Future Appointments  Date Time Provider Department Center  04/06/2024 10:15 AM Gregg Lek, MD [facility] None    No orders of the defined types were placed in this encounter.  Cathlean Ely DNP, CNM 01/12/2024 4:23 PM

## 2024-01-12 NOTE — Patient Instructions (Signed)
[phone] is the phone number for Pregnancy Classes or hospital tours at [facility].   You will be referred to  [email or web address]   for more information on childbirth classes   At this site you may register for classes. You may sign up for a waiting list if classes are full. Please SIGN UP FOR THIS!.   When the waiting list becomes long, sometimes new classes can be added.  [facility] [place] Call to Register: [phone] or [phone]   or   Register Online: [email or web address] THESE CLASSES FILL UP VERY QUICKLY, SO SIGN UP AS SOON AS YOU CAN!!! Please visit [facility] pregnancy website at [email or web address]  Childbirth Classes  Option 1: Birth & Baby Series ? Series of 3 weekly classes, on the same day of the week (can choose Mon-Thurs) from 6-9pm ? Helps you and your support person prepare for childbirth ? Reviews newborn care, labor & birth, cesarean birth, pain management, and comfort techniques ? Cost: $60 per couple for insured or self-pay, $30 per couple for Medicaid  Option 2: Weekend Birth & Baby ? This class is a weekend version of our Birth & Baby series.  It is designed for parents who have a difficult time fitting several weeks of classes into their schedule.   ? Covers the care of your newborn and the basics of labor and childbirth ? Friday 6:30pm-8:30pm Saturday 9am-4pm, includes lunch for you and your partner  ? Cost: $75 per couple for insured or self-pay, $30 per couple for Medicaid  Option 3: Natural Childbirth ? This series of 5 weekly classes is for expectant parents who want to learn and practice natural methods of coping with the process of labor and childbirth.  Can choose Mon or Tues, 7-9pm.   ? Covers relaxation, breathing, massage, visualization, role of the partner, and helpful positioning ? Participants learn how to be confident  in their body's ability to give birth. Class empowers and helps parents make informed decisions about care. Includes discussion that will help new parents transition into the immediate postpartum period.  ? Cost: $75 per couple for insured or self-pay, $30 per couple for Medicaid  Option 4: Online Birth & Baby ? This online class offers you the freedom to complete a Birth & Baby series in the comfort of your own home.  The flexibility of this option allows you to review sections at your own pace, at times convenient to you and your support people.  It includes additional video information, animations, quizzes and extended activities. Get organized with helpful eClass tools, checklists, and trackers.  ? Cost: $60 for 60 days of online access                                                                            Other Available Classes  Baby & Me Enjoy this time to discuss newborn & infant parenting topics and family adjustment issues with other new mothers in a relaxed environment. Each week brings a new speaker or baby-centered activity. We encourage mothers and their babies (birth to crawling) to join us. You are welcome to visit this group even if you haven't delivered yet! It's wonderful to make new friends early   and watch other moms interact with their babies. No registration or fee.  Big Brother/Big Sister Let your children share in the joy of a new brother or sister in this special class designed just for them. Discussion includes how families care for babies: swaddling, holding, diapering, safety, as well as how they can be helpful in their new role. This class is designed for children ages 2 to 6, but any age is welcome. Please register each child individually. $5 Breastfeeding Support Group This group is a mother-to-mother support circle where moms have the opportunity to share their breastfeeding experiences. A Breastfeeding Support nurse is present for questions and concerns. An infant  scale is available for weight checks. No fee or registration.  Breastfeeding Your Baby Breastfeeding is a special time for mother and child. This class will help you feel ready to begin this important relationship. Your partner is encouraged to attend with you. Learn what to expect and feel more confident in the first days of breastfeeding your newborn. This class also addresses the most common fears and challenges of breastfeeding during the first few weeks, months, and beyond. $30 per couple Caring for Baby This class is for expectant and adoptive parents who want to learn and practice the most up-to-date newborn care for their babies. Focus is on birth through first six weeks of life. Topics include feeding, bathing, diapering, crying, umbilical cord care, circumcision care and safe sleep. Parents learn how to recognize symptoms of illness and when to call the pediatrician. Register only the mom-to-be and your partner can plan to come with you. (*Note: This class is included in the Birth & Baby series and the Weekend Birth & Baby classes.) $10 per couple Comfort Techniques & Tour This 2-hour interactive class is designed for those who either do not wish to take the Birth & Baby series or for those who prefer our online childbirth class, but don't want to miss the opportunity to learn and practice hands-on techniques. These skills can help relieve some of the discomfort of labor and encourage your baby to rotate toward the best position for birth. You and your partner will be able to try a variety of labor positions with birth balls and rebozos as well as practice breathing, relaxation, and visual techniques. $20 per couple Daddy Boot Camp This course offers Dads-to-be the tools and knowledge needed to feel confident on their journey to becoming new fathers. Experienced dads, who have been trained as coaches, teach dads-to-be how to hold, comfort, diapers, swaddle and play with their infant while being  able to support the new mom as well. $25 Grandparent Love Expecting a grandbaby? Learn about the latest infant care and safety recommendations and ways to support your own child as he or she transitions into the parenting role. $10 per person Infant and Child CPR Parents, grandparents, babysitters, and friends learn Cardio-Pulmonary Resuscitation skills for infants and children. You will also learn how to treat both conscious and unconscious choking infants and children. Register each participant individually. (Note: This Family & Friends program does not offer certification.) $20 per person Marvelous Multiples Expecting twins, triplets, or more? This free 2-hour class covers the differences in labor, birth, parenting, and breastfeeding issues that face multiples' parents.  [facility]aternity [facility] Center Virtual Tour  Online virtual tour of the new [facility] [place]  Mom Talk This free mom-led group offers support and connection to mothers as they journey through the adjustments and struggles of that   sometimes overwhelming first year after the birth of a child. A member of our staff will be present to share resources and additional support if needed, as you care for yourself and baby. You are welcome to visit this group before you deliver! It's wonderful to meet new friends early and watch other moms interact with their babies.  Waterbirth Class Interested in a waterbirth? This free informational class will help you discover whether waterbirth is the right fit for you and is required if you are planning a waterbirth. Education about waterbirth itself, supplies you may need, and what you may need from your support team is included in this class. Partners are encouraged to come.    

## 2024-01-13 ENCOUNTER — Other Ambulatory Visit: Payer: Self-pay | Admitting: Advanced Practice Midwife

## 2024-01-13 ENCOUNTER — Other Ambulatory Visit (HOSPITAL_COMMUNITY): Payer: Self-pay | Admitting: Obstetrics & Gynecology

## 2024-01-13 DIAGNOSIS — R790 Abnormal level of blood mineral: Secondary | ICD-10-CM | POA: Insufficient documentation

## 2024-01-13 DIAGNOSIS — R42 Dizziness and giddiness: Secondary | ICD-10-CM

## 2024-01-14 LAB — IRON AND TIBC
Iron Saturation: 13 % — ABNORMAL LOW (ref 15–55)
Iron: 69 ug/dL (ref 27–159)
Total Iron Binding Capacity: 538 ug/dL — ABNORMAL HIGH (ref 250–450)
UIBC: 469 ug/dL — ABNORMAL HIGH (ref 131–425)

## 2024-01-14 LAB — FERRITIN: Ferritin: 19 ng/mL (ref 15–150)

## 2024-01-16 ENCOUNTER — Ambulatory Visit: Payer: Self-pay | Admitting: Advanced Practice Midwife

## 2024-01-17 LAB — CERVICOVAGINAL ANCILLARY ONLY
Bacterial Vaginitis (gardnerella): NEGATIVE
Candida Glabrata: NEGATIVE
Candida Vaginitis: POSITIVE — AB
Chlamydia: NEGATIVE
Comment: NEGATIVE
Comment: NEGATIVE
Comment: NEGATIVE
Comment: NEGATIVE
Comment: NEGATIVE
Comment: NORMAL
Neisseria Gonorrhea: NEGATIVE
Trichomonas: NEGATIVE

## 2024-01-19 ENCOUNTER — Ambulatory Visit: Payer: Self-pay | Admitting: Advanced Practice Midwife

## 2024-01-19 MED ORDER — TERCONAZOLE 0.4 % VA CREA: 1.0000 | TOPICAL_CREAM | Freq: Every day | VAGINAL | 0 refills | Status: DC

## 2024-01-23 ENCOUNTER — Ambulatory Visit (HOSPITAL_COMMUNITY): Payer: MEDICAID

## 2024-01-23 NOTE — Therapy (Incomplete)
 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 ,gets off street   Panic attacks    Tachycardia    Urinary tract infection    gets them frequently   Vaginal Pap smear, abnormal    Past Surgical History:  Procedure Laterality Date   DILATION AND EVACUATION  11/28/2010   Patient Active Problem List   Diagnosis Date Noted   Abnormal iron saturation 01/13/2024   GBS bacteriuria 09/22/2023   Supervision of other normal pregnancy, antepartum 09/20/2023   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   Rh negative state in antepartum period 01/15/2021   Death of husband Oct 12, 2019   History of gestational diabetes 10/24/2018   Tachycardia 04/20/2018   History of illicit drug use 01/27/2017   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  ONSET DATE: ***  Rationale for Evaluation and Treatment: Rehabilitation  SUBJECTIVE:   SUBJECTIVE STATEMENT: *** Pt accompanied by: {accompnied:27141}  PERTINENT HISTORY: ***  PAIN:  Are you having pain? {OPRCPAIN:27236}  PRECAUTIONS: {Therapy precautions:24002}  RED FLAGS: {PT Red Flags:29287}   WEIGHT BEARING RESTRICTIONS: {Yes ***/No:24003}  FALLS: Has patient fallen in last 6 months?  {fallsyesno:27318}  LIVING ENVIRONMENT: Lives with: {OPRC lives with:25569::lives with their family} Lives in: {Lives in:25570} Stairs: {opstairs:27293} Has following equipment at home: {Assistive devices:23999}  PLOF: {PLOF:24004}  PATIENT GOALS: ***  OBJECTIVE:  Note: Objective measures were completed at Evaluation unless otherwise noted.  DIAGNOSTIC FINDINGS:  CLINICAL DATA:  Syncope/presyncope, cerebrovascular cause suspected; Cervical radiculopathy, no red flags weak / numb hand an leg   EXAM: MRI HEAD WITHOUT CONTRAST   MRI CERVICAL SPINE WITHOUT CONTRAST   TECHNIQUE: Multiplanar, multiecho pulse sequences of the brain and surrounding structures, and cervical spine, to include the craniocervical junction and cervicothoracic junction, were obtained without intravenous contrast.   COMPARISON:  CTA head/neck 01/04/2023.   FINDINGS: MRI HEAD FINDINGS   Brain: No acute infarction, hemorrhage, hydrocephalus, extra-axial collection or mass lesion. A couple small T2/FLAIR hyperintensities in the white matter are nonspecific but considered within normal limits for patient age.   Vascular: Major arterial flow voids are maintained at the skull base.   Skull and upper cervical spine: Normal marrow signal.   Sinuses/Orbits: Negative.   Other: No mastoid effusions.   MRI CERVICAL SPINE FINDINGS   Alignment: No substantial sagittal subluxation.   Vertebrae: No fracture, evidence of discitis, or bone lesion.   Cord: Normal cord signal.   Posterior Fossa, vertebral arteries, paraspinal tissues: Negative.   Disc levels: No significant canal or foraminal stenosis. Small posterior disc osteophyte complexes at C5-C6 and C6-C7.   IMPRESSION: 1. No evidence of acute intracranial abnormality. 2. No significant  stenosis in the cervical spine.  COGNITION: Overall cognitive status: {cognition:24006}   SENSATION: {sensation:27233}  EDEMA:  {edema:24020}  MUSCLE  TONE:  {LE tone:25568}  DTRs:  {DTR SITE:24025}  POSTURE:  {posture:25561}  Cervical ROM:    {AROM/PROM:27142} A/PROM (deg) eval  Flexion   Extension   Right lateral flexion   Left lateral flexion   Right rotation   Left rotation   (Blank rows = not tested)  STRENGTH: ***  LOWER EXTREMITY MMT:   MMT Right eval Left eval  Hip flexion    Hip abduction    Hip adduction    Hip internal rotation    Hip external rotation    Knee flexion    Knee extension    Ankle dorsiflexion    Ankle plantarflexion    Ankle inversion    Ankle eversion    (Blank rows = not tested)  BED MOBILITY:  {Bed mobility:24027}  TRANSFERS: Assistive device utilized: {Assistive devices:23999}  Sit to stand: {Levels of assistance:24026} Stand to sit: {Levels of assistance:24026} Chair to chair: {Levels of assistance:24026} Floor: {Levels of assistance:24026}  RAMP: {Levels of assistance:24026}  CURB: {Levels of assistance:24026}  GAIT: Gait pattern: {gait characteristics:25376} Distance walked: *** Assistive device utilized: {Assistive devices:23999} Level of assistance: {Levels of assistance:24026} Comments: ***  FUNCTIONAL TESTS:  {Functional tests:24029}  PATIENT SURVEYS:  ABC scale: {:PHR,OPRCABCSCALE}  DHI: {:PHR,OPRCDHI}  VESTIBULAR ASSESSMENT:  GENERAL OBSERVATION: ***   SYMPTOM BEHAVIOR:  Subjective history: ***  Non-Vestibular symptoms: {nonvestibular symptoms:25260}  Type of dizziness: {Type of Dizziness:25255}  Frequency: ***  Duration: ***  Aggravating factors: {Aggravating Factors:25258}  Relieving factors: {Relieving Factors:25259}  Progression of symptoms: {DESC; BETTER/WORSE:18575}  OCULOMOTOR EXAM:  Ocular Alignment: {Ocular Alignment:25262}  Ocular ROM: {RANGE OF MOTION:21649}  Spontaneous Nystagmus: {Spontaneous nystagmus:25263}  Gaze-Induced Nystagmus: {gaze-induced nystagmus:25264}  Smooth Pursuits: {smooth pursuit:25265}  Saccades:  {saccades:25266}  Convergence/Divergence: *** cm    VESTIBULAR - OCULAR REFLEX:   Slow VOR: {slow VOR:25290}  VOR Cancellation: {vor cancellation:25291}  Head-Impulse Test: {head impulse test:25272}  Dynamic Visual Acuity: {dynamic visual acuity:25273}   POSITIONAL TESTING: {Positional tests:25271}  MOTION SENSITIVITY:  Motion Sensitivity Quotient Intensity: 0 = none, 1 = Lightheaded, 2 = Mild, 3 = Moderate, 4 = Severe, 5 = Vomiting  Intensity  1. Sitting to supine   2. Supine to L side   3. Supine to R side   4. Supine to sitting   5. L Hallpike-Dix   6. Up from L    7. R Hallpike-Dix   8. Up from R    9. Sitting, head tipped to L knee   10. Head up from L knee   11. Sitting, head tipped to R knee   12. Head up from R knee   13. Sitting head turns x5   14.Sitting head nods x5   15. In stance, 180 turn to L    16. In stance, 180 turn to R     OTHOSTATICS: {Exam; orthostatics:31331}  FUNCTIONAL GAIT: {Functional tests:24029}  TREATMENT DATE:  01/23/2024  Vitals: BP: HR: O2 sat: Evaluation: -ROM measured, Strength assessed, HEP prescribed, pt educated on prognosis, findings, and importance of HEP compliance if given.  Manual Therapy: -CPA of Lumbar Spinal segments L3-L5, grade II-III mobilizations -STM of Lumbar Paraspinal musculature  Therapeutic Exercise: -Supine bridges 2 sets of 10 reps, 3 second holds, symptomatic, pt cued for max hip extension -Standing 3 way hip 1 sets 10 reps, bilaterally, pt cued for upright trunk and maintaining of neutral spine -Lateral stepping 3 laps 20 feet per lap, second 2 with RTB around ankles, pt cued for upright posture -Forward lunges, 1 set of 5 reps better performance going into RLE, pt cued for core activation and upright posture      Canalith Repositioning:  {Canalith Repositioning:25283} Gaze  Adaptation:  {gaze adaptation:25286} Habituation:  {habituation:25288} Other: ***  PATIENT EDUCATION: Education details: Pt was educated on findings of PT evaluation, prognosis, frequency of therapy visits and rationale, attendance policy, and HEP if given.   Person educated: {Person educated:25204} Education method: {Education Method:25205} Education comprehension: {Education Comprehension:25206}  HOME EXERCISE PROGRAM:  GOALS: Goals reviewed with patient? {yes/no:20286}  SHORT TERM GOALS: Target date: {follow up:25551}  Pt will be independent with home exercise program in order to improve balance and decrease dizziness symptoms in order to decrease fall risk and improve function at home and work. Baseline: *** Goal status: {GOALSTATUS:25110}  2.  *** Baseline: *** Goal status: {GOALSTATUS:25110}  LONG TERM GOALS: Target date: {follow up:25551}  Patient will have improved FOTO score of 6 points or greater in order to demonstrate improvements in patient's ADLs and functional performance.  Baseline: *** Goal status: {GOALSTATUS:25110}  2.  Patient will demonstrate reduced falls risk as evidenced by Dynamic Gait Index (DGI) >19/24. Baseline: *** Goal status: {GOALSTATUS:25110}  3.  Patient will reduce falls risk as indicated by Activities Specific Balance Confidence Scale (ABC) >67%.      Patient will have demonstrate decreased falls risk as indicated by Activities Specific Balance Confidence Scale score of 80% or greater. Baseline: *** Goal status: {GOALSTATUS:25110}  4.  Patient will reduce perceived disability to moderate levels as indicated by <70 on Dizziness Handicap Inventory [facility]).      Patient will reduce perceived disability to low levels as indicated by <40 on Dizziness Handicap Inventory. Baseline: *** Goal status: {GOALSTATUS:25110}  5.  Patient will report 50% or better improvement in their dizziness and imbalance symptoms overall in order for patient to be  able to perform ADLs and resume prior activities. Baseline: *** Goal status: {GOALSTATUS:25110}  6.  *** Baseline: *** Goal status: {GOALSTATUS:25110}   ASSESSMENT:  CLINICAL IMPRESSION: Patient is a 32 y.o. female who was seen today for physical therapy evaluation and treatment for R42 (ICD-10-CM) - Vertigo.   Patient demonstrates decreased LE strength, abnormal pain rating, and impaired balance. Patient also demonstrates difficulty with ambulation during today's session with decreased stride length and velocity noted. Patient also demonstrates ***. Patient requires ***. Patient would benefit from skilled physical therapy for increased endurance with ambulation, increased LE strength, and balance for improved gait quality, return to higher level of function with ADLs, and progress towards therapy goals.  OBJECTIVE IMPAIRMENTS: {opptimpairments:25111}.   ACTIVITY LIMITATIONS: {activitylimitations:27494}  PARTICIPATION LIMITATIONS: {participationrestrictions:25113}  PERSONAL FACTORS: {Personal factors:25162} are also affecting patient's functional outcome.   REHAB POTENTIAL: {rehabpotential:25112}  CLINICAL DECISION MAKING: {clinical decision making:25114}  EVALUATION COMPLEXITY: {Evaluation complexity:25115}   PLAN:  PT FREQUENCY: {rehab frequency:25116}  PT DURATION: {rehab duration:25117}  PLANNED INTERVENTIONS: {rehab planned interventions:25118::97110-Therapeutic exercises,97530- Therapeutic [phone]- Neuromuscular re-education,97535- Self Rjmz,02859- Manual therapy}  PLAN FOR NEXT SESSION: ***   Lang Ada, PT, DPT [facility] Office: [phone] 7:52 AM, 01/23/24

## 2024-01-26 ENCOUNTER — Ambulatory Visit: Payer: MEDICAID | Admitting: Advanced Practice Midwife

## 2024-01-26 ENCOUNTER — Encounter: Payer: Self-pay | Admitting: Advanced Practice Midwife

## 2024-01-26 VITALS — BP 117/74 | HR 101

## 2024-01-26 DIAGNOSIS — Z3480 Encounter for supervision of other normal pregnancy, unspecified trimester: Secondary | ICD-10-CM

## 2024-01-26 DIAGNOSIS — Z3483 Encounter for supervision of other normal pregnancy, third trimester: Secondary | ICD-10-CM | POA: Diagnosis not present

## 2024-01-26 DIAGNOSIS — D229 Melanocytic nevi, unspecified: Secondary | ICD-10-CM

## 2024-01-26 DIAGNOSIS — Z3A31 31 weeks gestation of pregnancy: Secondary | ICD-10-CM

## 2024-01-26 DIAGNOSIS — Z79899 Other long term (current) drug therapy: Secondary | ICD-10-CM

## 2024-01-26 MED ORDER — CLONAZEPAM 1 MG PO TABS: ORAL_TABLET | ORAL | 0 refills | Status: DC

## 2024-01-26 NOTE — Progress Notes (Addendum)
   LOW-RISK PREGNANCY VISIT Patient name: Patricia Richards MRN [id number]  Date of birth: 1990/12/16 Chief Complaint:   Routine Prenatal Visit  History of Present Illness:   Patricia Richards is a 33 y.o. [id number] female at [redacted]w[redacted]d with an Estimated Date of Delivery: 03/29/24 being seen today for ongoing management of a low-risk pregnancy.  Today she reports pelvic pressure/pain, esp when walking.  Recommended mat belt. . Contractions: Irritability.  .  Movement: Present. denies leaking of fluid. Has multiple moles that she says have changed a lot and would like to get them looked at.  Review of Systems:   Pertinent items are noted in HPI Denies abnormal vaginal discharge w/ itching/odor/irritation, headaches, visual changes, shortness of breath, chest pain, abdominal pain, severe nausea/vomiting, or problems with urination or bowel movements unless otherwise stated above. Pertinent History Reviewed:  Reviewed past medical,surgical, social, obstetrical and family history.  Reviewed problem list, medications and allergies. Physical Assessment:   Vitals:   01/26/24 1351  BP: 117/74  Pulse: (!) 101  There is no height or weight on file to calculate BMI.        Physical Examination:   General appearance: Well appearing, and in no distress  Mental status: Alert, oriented to person, place, and time  Skin: Warm & dry.  Mole on right temple and on back are bi-colored, irregular borders, rough.   Cardiovascular: Normal heart rate noted  Respiratory: Normal respiratory effort, no distress  Abdomen: Soft, gravid, nontender  Pelvic: Cervical exam deferred         Extremities: Edema: None Chaperone:  N/A   Fetal Status: Fetal Heart Rate (bpm): 142 Fundal Height: 32 cm Movement: Present      No results found for this or any previous visit (from the past 24 hours).  Assessment & Plan:    Pregnancy: [id number] at [redacted]w[redacted]d 1. Supervision of other normal pregnancy, antepartum (Primary)   2. [redacted] weeks  gestation of pregnancy   3. Long term prescription benzodiazepine use   Meds:  Meds ordered this encounter  Medications   clonazePAM  (KLONOPIN ) 1 MG tablet    Sig: TAKE (1) TABLET BY MOUTH THREE TIMES DAILY    Dispense:  63 tablet    Refill:  0    May fill on 02/03/24. 21d supply until next visit    Supervising Provider:   JAYNE VONN DEL [[id number]]   Labs/procedures today: none  Plan:  Continue routine obstetrical care; dermatology referral made.   Next visit: prefers in person    Reviewed: Preterm labor symptoms and general obstetric precautions including but not limited to vaginal bleeding, contractions, leaking of fluid and fetal movement were reviewed in detail with the patient.  All questions were answered. Has home bp cuff.. Check bp weekly, let us  know if >140/90.   Follow-up: No follow-ups on file.  Future Appointments  Date Time Provider Department Center  02/10/2024  9:00 AM Georgina Cadet, PT [facility] None  04/06/2024 10:15 AM Gregg Lek, MD [facility] None    No orders of the defined types were placed in this encounter.  Cathlean Ely DNP, CNM 01/26/2024 2:27 PM

## 2024-01-26 NOTE — Addendum Note (Signed)
 Addended by: NEWTON MERING on: 01/26/2024 10:05 PM   Modules accepted: Orders

## 2024-01-26 NOTE — Patient Instructions (Signed)
 [employer] taping for pregnancy:  Youtube has good vidoes of how tos for lower back, pelvic, hip pain; swelling of feet, etc

## 2024-02-09 ENCOUNTER — Encounter: Payer: Self-pay | Admitting: Advanced Practice Midwife

## 2024-02-09 ENCOUNTER — Ambulatory Visit (INDEPENDENT_AMBULATORY_CARE_PROVIDER_SITE_OTHER): Payer: MEDICAID | Admitting: Advanced Practice Midwife

## 2024-02-09 VITALS — BP 93/62 | HR 89 | Wt 179.0 lb

## 2024-02-09 DIAGNOSIS — Z3A33 33 weeks gestation of pregnancy: Secondary | ICD-10-CM | POA: Diagnosis not present

## 2024-02-09 DIAGNOSIS — Z3483 Encounter for supervision of other normal pregnancy, third trimester: Secondary | ICD-10-CM

## 2024-02-09 DIAGNOSIS — Z3480 Encounter for supervision of other normal pregnancy, unspecified trimester: Secondary | ICD-10-CM

## 2024-02-09 NOTE — Progress Notes (Addendum)
   LOW-RISK PREGNANCY VISIT Patient name: Patricia Richards MRN [id number]  Date of birth: [birth year] Chief Complaint:   Routine Prenatal Visit  History of Present Illness:   Patricia Richards is a 33 y.o. [id number] female at 104w0d with an Estimated Date of Delivery: 03/29/24 being seen today for ongoing management of a low-risk pregnancy.  Today she reports normal grand multiparity complaints.  Has found herself really dizzy after standing upright.  Suspect BP drops, as pt's BP runs low. Encouraged hydration, slower position changes. Has appt to start PT tomorrow.  INSTRUCTED TO ASK FOR KT TAPING TO SEE IF IT'S HELPFUL BEFORE SHE SPENDS THE MONEY ON A ROLL OF IT. .  .  Movement: Present. denies leaking of fluid. Review of Systems:   Pertinent items are noted in HPI Denies abnormal vaginal discharge w/ itching/odor/irritation, headaches, visual changes, shortness of breath, chest pain, abdominal pain, severe nausea/vomiting, or problems with urination or bowel movements unless otherwise stated above. Pertinent History Reviewed:  Reviewed past medical,surgical, social, obstetrical and family history.  Reviewed problem list, medications and allergies.    Physical Assessment:     Vitals:   02/09/24 1201  BP: 93/62  Pulse: 89  Weight: 179 lb (81.2 kg)  Body mass index is 30.73 kg/m.        Physical Examination:   General appearance: Well appearing, and in no distress  Mental status: Alert, oriented to person, place, and time  Skin: Warm & dry  Cardiovascular: Normal heart rate noted  Respiratory: Normal respiratory effort, no distress  Abdomen: Soft, gravid, nontender  Pelvic: Cervical exam deferred         Extremities: Edema: None Chaperone:  N/A   Fetal Status: Fetal Heart Rate (bpm): 148 Fundal Height: 31 cm Movement: Present      No results found for this or any previous visit (from the past 24 hours).  Assessment & Plan:    Pregnancy: [id number] at [redacted]w[redacted]d 1. Supervision of  other normal pregnancy, antepartum (Primary)   2. [redacted] weeks gestation of pregnancy  Completed waterbirth class.      Meds: No orders of the defined types were placed in this encounter.  Labs/procedures today: NONE  Plan:  Continue routine obstetrical care  Next visit: prefers in person    Reviewed: Preterm labor symptoms and general obstetric precautions including but not limited to vaginal bleeding, contractions, leaking of fluid and fetal movement were reviewed in detail with the patient.  All questions were answered.Has a home bp cuff.. Check bp weekly, let us  know if >140/90.   Follow-up: Return in about 2 weeks (around 02/23/2024) for LROB.  Future Appointments  Date Time Provider Department Center  02/10/2024  9:00 AM Georgina Cadet, PT [facility] None  02/23/2024 11:10 AM Newton Mering, CNM [facility]  03/06/2024 12:30 PM Alm Delon SAILOR, DO [facility] None  04/06/2024 10:15 AM Gregg Lek, MD [facility] None    No orders of the defined types were placed in this encounter.  Mering Newton DNP, CNM 02/09/2024 2:34 PM

## 2024-02-09 NOTE — Patient Instructions (Addendum)
 Patricia Richards, I greatly value your feedback.  If you receive a survey following your visit with us  today, we appreciate you taking the time to fill it out.  Thanks, Sherrell Ely, DNP, CNM  [facility] HAS MOVED!!! It is now [facility] at [facility] ([address] [place], [place] [address]) Entrance located off of [address] Free 24/7 valet parking   Go to [email or web address] to register for FREE online childbirth classes    Call the office [phone]) or go to [facility] if: You begin to have strong, frequent contractions Your water  breaks.  Sometimes it is a big gush of fluid, sometimes it is just a trickle that keeps getting your panties wet or running down your legs You have vaginal bleeding.  It is normal to have a small amount of spotting if your cervix was checked.  You don't feel your baby moving like normal.  If you don't, get you something to eat and drink and lay down and focus on feeling your baby move.  You should feel at least 10 movements in 2 hours.  If you don't, you should call the office or go to [facility].   Home Blood Pressure Monitoring for Patients   Your provider has recommended that you check your blood pressure (BP) at least once a week at home. If you do not have a blood pressure cuff at home, one will be provided for you. Contact your provider if you have not received your monitor within 1 week.   Helpful Tips for Accurate Home Blood Pressure Checks  Don't smoke, exercise, or drink caffeine  30 minutes before checking your BP Use the restroom before checking your BP (a full bladder can raise your pressure) Relax in a comfortable upright chair Feet on the ground Left arm resting comfortably on a flat surface at the level of your heart Legs uncrossed Back supported Sit quietly and don't talk Place the cuff on your bare arm Adjust snuggly, so that only two fingertips can fit between your skin and the top  of the cuff Check 2 readings separated by at least one minute Keep a log of your BP readings For a visual, please reference this diagram: [email or web address].care/bpdiagram  Provider Name: [facility]     Phone: [phone]  Zone 1: ALL CLEAR  Continue to monitor your symptoms:  BP reading is less than 140 (top number) or less than 90 (bottom number)  No right upper stomach pain No headaches or seeing spots No feeling nauseated or throwing up No swelling in face and hands  Zone 2: CAUTION Call your doctor's office for any of the following:  BP reading is greater than 140 (top number) or greater than 90 (bottom number)  Stomach pain under your ribs in the middle or right side Headaches or seeing spots Feeling nauseated or throwing up Swelling in face and hands  Zone 3: EMERGENCY  Seek immediate medical care if you have any of the following:  BP reading is greater than160 (top number) or greater than 110 (bottom number) Severe headaches not improving with Tylenol  Serious difficulty catching your breath Any worsening symptoms from Zone 2   [employer] taping for pregnancy:  Youtube has good vidoes of how tos for lower back, pelvic, hip pain; swelling of feet, etc

## 2024-02-10 ENCOUNTER — Ambulatory Visit (HOSPITAL_COMMUNITY): Payer: MEDICAID | Attending: Obstetrics & Gynecology

## 2024-02-10 ENCOUNTER — Other Ambulatory Visit: Payer: Self-pay

## 2024-02-10 ENCOUNTER — Encounter: Payer: Self-pay | Admitting: Advanced Practice Midwife

## 2024-02-10 ENCOUNTER — Encounter (HOSPITAL_COMMUNITY): Payer: Self-pay

## 2024-02-10 DIAGNOSIS — H8111 Benign paroxysmal vertigo, right ear: Secondary | ICD-10-CM | POA: Insufficient documentation

## 2024-02-10 DIAGNOSIS — Z7409 Other reduced mobility: Secondary | ICD-10-CM | POA: Insufficient documentation

## 2024-02-10 DIAGNOSIS — R42 Dizziness and giddiness: Secondary | ICD-10-CM | POA: Insufficient documentation

## 2024-02-10 DIAGNOSIS — H832X9 Labyrinthine dysfunction, unspecified ear: Secondary | ICD-10-CM | POA: Insufficient documentation

## 2024-02-10 NOTE — Therapy (Addendum)
 OUTPATIENT PHYSICAL THERAPY VESTIBULAR EVALUATION    Pt was called concerning her missed appointment 03/22/24 and pt states she would like to cancel all appointments until she has the baby. Pt educated she will be discharged from this episode of care and will need new referral if she be in need of therapy services in the future. Pt has not returned since the initial evaluation and treatment.  PHYSICAL THERAPY DISCHARGE SUMMARY  Visits from Start of Care: 0  Current functional level related to goals / functional outcomes: See evaluation, pt not returned   Remaining deficits: See evaluation, pt not returned   Education / Equipment: See evaluation, pt not returned   Patient agrees to discharge. Patient goals were not met. Patient is being discharged due to not returning since the last visit.  Patient Name: Patricia Richards MRN: [id number] DOB:1990-08-30, 33 y.o., female Today's Date: 02/10/2024  END OF SESSION:  PT End of Session - 02/10/24 1159     Visit Number 1    Date for Recertification  03/09/24    Authorization Type [employer]    Authorization Time Period seeking auth    Authorization - Visit Number 1    Progress Note Due on Visit 5    PT Start Time 0900    PT Stop Time 0941    PT Time Calculation (min) 41 min    Activity Tolerance Patient tolerated treatment well    Behavior During Therapy WFL for tasks assessed/performed          Past Medical History:  Diagnosis Date   Anemia    Bipolar 1 disorder (HCC)    Chronic back pain    Depression    on meds now, doing well   Gestational diabetes    Herpes    Kidney stones    Mental disorder    panic attacks  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 ,gets off street   Panic attacks    Tachycardia    Urinary tract infection    gets them frequently   Vaginal Pap smear, abnormal    Past Surgical History:  Procedure Laterality Date   DILATION AND EVACUATION  11/28/2010   Patient Active Problem List    Diagnosis Date Noted   Abnormal iron saturation 01/13/2024   GBS bacteriuria 09/22/2023   Supervision of other normal pregnancy, antepartum 09/20/2023   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   Rh negative state in antepartum period 01/08/2021   Death of husband 10/05/19   History of gestational diabetes 10/24/2018   Tachycardia 04/20/2018   History of illicit drug use 01/27/2017   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   Abnormal Pap smear of cervix 05/20/2015   Depression with anxiety 05/13/2015   Hallucinations 05/13/2015   History of suicidal ideation 05/13/2015   History of alcohol abuse 03/18/2015    PCP: [facility] Personal  REFERRING PROVIDER: Jayne Vonn DEL, MD  REFERRING DIAG: R42 (ICD-10-CM) - Vertigo  THERAPY DIAG:  BPPV (benign paroxysmal positional vertigo), right  Vestibular hypofunction, unspecified laterality  Impaired functional mobility and activity tolerance  ONSET DATE: May 2025  Rationale for Evaluation and Treatment: Rehabilitation  SUBJECTIVE:   SUBJECTIVE STATEMENT: Pt states the dizziness had her laying down and not moving much from May to August. Pt states she is 33 weeks and a day pregnant today. Pt states this is her 8th kid. Pt states she has had this issue (dizziness) even before her first child. Pt states she used  to drink a lot and just take meclizine  for the dizziness. Pt states she has only taken one meclizine  pill recently which caused side effect with another drug, actually made dizziness worse. Pt states she has not dealt with bad dizziness since August, she does continue to deal with floaters and ringing in the ears. Tinnitus is worsening and is always in the right ear. Pt states her dizziness is a drunk and spinning feeling. Eye appointment 18th of October, has been 2 years.  Pt accompanied by: self  PERTINENT HISTORY:  Illicit drug use Mental disorders  PAIN:  Are you having pain? No  PRECAUTIONS:  None  RED FLAGS: Right leg numbness, happens with all pregnancies   WEIGHT BEARING RESTRICTIONS: No  FALLS: Has patient fallen in last 6 months? Yes. Number of falls 2, due to hip  LIVING ENVIRONMENT: Lives with: lives with their family and lives with their spouse Lives in: Mobile home Stairs: Yes: External: 3 steps; on right going up Has following equipment at home: None  PLOF: Independent and Independent with basic ADLs  PATIENT GOALS: Decrease dizziness, decreased ringing in ears  OBJECTIVE:  Note: Objective measures were completed at Evaluation unless otherwise noted.  DIAGNOSTIC FINDINGS:  CLINICAL DATA:  Syncope/presyncope, cerebrovascular cause suspected; Cervical radiculopathy, no red flags weak / numb hand an leg   EXAM: MRI HEAD WITHOUT CONTRAST   MRI CERVICAL SPINE WITHOUT CONTRAST   TECHNIQUE: Multiplanar, multiecho pulse sequences of the brain and surrounding structures, and cervical spine, to include the craniocervical junction and cervicothoracic junction, were obtained without intravenous contrast.   COMPARISON:  CTA head/neck 01/04/2023.   FINDINGS: MRI HEAD FINDINGS   Brain: No acute infarction, hemorrhage, hydrocephalus, extra-axial collection or mass lesion. A couple small T2/FLAIR hyperintensities in the white matter are nonspecific but considered within normal limits for patient age.   Vascular: Major arterial flow voids are maintained at the skull base.   Skull and upper cervical spine: Normal marrow signal.   Sinuses/Orbits: Negative.   Other: No mastoid effusions.   MRI CERVICAL SPINE FINDINGS   Alignment: No substantial sagittal subluxation.   Vertebrae: No fracture, evidence of discitis, or bone lesion.   Cord: Normal cord signal.   Posterior Fossa, vertebral arteries, paraspinal tissues: Negative.   Disc levels: No significant canal or foraminal stenosis. Small posterior disc osteophyte complexes at C5-C6 and C6-C7.    IMPRESSION: 1. No evidence of acute intracranial abnormality. 2. No significant stenosis in the cervical spine.   Cervical ROM:  no dizziness but floaters present  Active A/PROM (deg) eval  Flexion WFL  Extension WFL  Right lateral flexion WFL  Left lateral flexion WFL  Right rotation WFL  Left rotation WFL  (Blank rows = not tested)     PATIENT SURVEYS:  DHI: TBA  VESTIBULAR ASSESSMENT:    OCULOMOTOR EXAM:  Ocular Alignment: normal  Ocular ROM: No Limitations  Spontaneous Nystagmus: absent  Gaze-Induced Nystagmus: absent  Smooth Pursuits: intact  Saccades: intact  Convergence/Divergence: 20 cm    VESTIBULAR - OCULAR REFLEX:   Slow VOR: Positive Bilaterally horizontal turns  VOR Cancellation: Comment: positive, spinning  Head-Impulse Test:   Dynamic Visual Acuity:    POSITIONAL TESTING: Right Dix-Hallpike: no nystagmus and but positive for dizziness Left Dix-Hallpike: no nystagmus  MOTION SENSITIVITY:  Motion Sensitivity Quotient Intensity: 0 = none, 1 = Lightheaded, 2 = Mild, 3 = Moderate, 4 = Severe, 5 = Vomiting  Intensity  1. Sitting to supine   2. Supine  to L side   3. Supine to R side   4. Supine to sitting   5. L Hallpike-Dix   6. Up from L    7. R Hallpike-Dix   8. Up from R    9. Sitting, head tipped to L knee   10. Head up from L knee   11. Sitting, head tipped to R knee   12. Head up from R knee   13. Sitting head turns x5   14.Sitting head nods x5   15. In stance, 180 turn to L    16. In stance, 180 turn to R     OTHOSTATICS: not done  FUNCTIONAL GAIT:                                                                                                                            TREATMENT DATE:  01/23/2024  Evaluation: -ROM measured, Strength assessed, HEP prescribed, pt educated on prognosis, findings, and importance of HEP compliance if given.     Canalith Repositioning:  Epley Right: Number of Reps: 1 and Response to Treatment:  comment: symptoms decreased, slight dizziness with position one and position two  Habituation:  Seated Vertical Head Movements: comment: negative, Seated Horizontal Head Movements: symptom description: funny feeling in head and dizziness, and Other:   Other: VOR cancellization, increased in dizziness  PATIENT EDUCATION: Education details: Pt was educated on findings of PT evaluation, prognosis, frequency of therapy visits and rationale, attendance policy, and HEP if given.   Person educated: Patient Education method: Explanation, Verbal cues, and Handouts Education comprehension: verbalized understanding, verbal cues required, and needs further education  HOME EXERCISE PROGRAM: Access Code: [email or web address] URL: [email or web address] Date: 02/10/2024 Prepared by: Lang Ada  Exercises - Seated Gaze Stabilization with Head Rotation  - 1 x daily - 7 x weekly - 3 sets - 10 reps - Seated VOR Cancellation  - 1 x daily - 7 x weekly - 3 sets - 10 reps  GOALS: Goals reviewed with patient? No  SHORT TERM GOALS: Target date: 02/24/24  Pt will be independent with home exercise program in order to improve balance and decrease dizziness symptoms in order to decrease fall risk and improve function at home and work. Baseline:  Goal status: INITIAL  2.  Pt will report no falls for the next 2 weeks due to dizziness for improved quality of life and increased overall wellbeing. Baseline:  Goal status: INITIAL  LONG TERM GOALS: Target date: 03/09/24  Patient will demonstrate negative R [facility] test for decreased dizziness and improved quality of life.  Baseline: Positive for dizziness in position one and two Goal status: INITIAL  2.  Patient will demonstrate reduced falls risk as evidenced by Functional Gait Assessment >/=22/30. Baseline: none Goal status: INITIAL   3.   Patient will reduce perceived disability to low levels as indicated by <40 on Dizziness Handicap  Inventory. Baseline: see objective Goal status: INITIAL  4.  Patient will report 50% or better improvement in their dizziness and imbalance symptoms overall in order for patient to be able to perform ADLs and resume prior activities. Baseline:  Goal status: INITIAL  ASSESSMENT:  CLINICAL IMPRESSION: Patient is a 33 y.o. female who was seen today for physical therapy evaluation and treatment for R42 (ICD-10-CM) - Vertigo.   Patient demonstrates increased dizziness, tinnitus, abnormal VOR, and impaired functional mobility. Patient also demonstrates abnormal convergence distance, pt states she has not had ocular exam in 2 years but has one next month. To be noted pt dealing with increased frequency of floaters and tinnitus. Patient also demonstrates positive test result for R [facility], dizziness. Symptoms improved following performance of Epley maneuver. Patient requires education on inner ear system, prognosis, and importance of HEP compliance. Patient would benefit from skilled physical therapy for decreased dizziness, increased functional mobility, and balance for decreased fall risk, return to higher level of function with ADLs, and progress towards therapy goals.  OBJECTIVE IMPAIRMENTS: decreased activity tolerance, decreased balance, decreased coordination, and dizziness.   ACTIVITY LIMITATIONS: bending, squatting, transfers, and bed mobility  PARTICIPATION LIMITATIONS: meal prep, cleaning, driving, community activity, and yard work  PERSONAL FACTORS: Age, Fitness, Past/current experiences, and Time since onset of injury/illness/exacerbation are also affecting patient's functional outcome.   REHAB POTENTIAL: Fair chronic in nature  CLINICAL DECISION MAKING: Stable/uncomplicated  EVALUATION COMPLEXITY: Low   PLAN:  PT FREQUENCY: 1x/week  PT DURATION: 4 weeks  PLANNED INTERVENTIONS: 97110-Therapeutic exercises, 97530- Therapeutic activity, 97112- Neuromuscular re-education,  97535- Self Care, 02859- Manual therapy, Patient/Family education, Balance training, and Vestibular training  PLAN FOR NEXT SESSION: DHI, retest R dix hall pike, progress VOR training, answer any questions about inner ear system, encourage eye exam.   Lang Ada, PT, DPT [facility] Office: [phone] 12:04 PM, 02/22/2024   Managed Medicaid Authorization Request Treatment Start Date: 2024/02/22  Visit Dx Codes: H81.11; H83.2X9; Z74.09  Functional Tool Score: DHI: TBA  For all possible CPT codes, reference the Planned Interventions line above.     Check all conditions that are expected to impact treatment: {Conditions expected to impact treatment:Psychological or psychiatric disorders and Unknown   If treatment provided at initial evaluation, no treatment charged due to lack of authorization.

## 2024-02-23 ENCOUNTER — Encounter: Payer: Self-pay | Admitting: Advanced Practice Midwife

## 2024-02-23 ENCOUNTER — Ambulatory Visit: Payer: MEDICAID | Admitting: Advanced Practice Midwife

## 2024-02-23 ENCOUNTER — Other Ambulatory Visit (HOSPITAL_COMMUNITY)
Admission: RE | Admit: 2024-02-23 | Discharge: 2024-02-23 | Disposition: A | Discharge status: Home / Self Care | Payer: MEDICAID | Source: Ambulatory Visit | Attending: Advanced Practice Midwife | Admitting: Advanced Practice Midwife

## 2024-02-23 VITALS — BP 114/74 | HR 102 | Wt 182.0 lb

## 2024-02-23 DIAGNOSIS — O26893 Other specified pregnancy related conditions, third trimester: Secondary | ICD-10-CM | POA: Insufficient documentation

## 2024-02-23 DIAGNOSIS — R300 Dysuria: Secondary | ICD-10-CM

## 2024-02-23 DIAGNOSIS — Z113 Encounter for screening for infections with a predominantly sexual mode of transmission: Secondary | ICD-10-CM

## 2024-02-23 DIAGNOSIS — Z3A35 35 weeks gestation of pregnancy: Secondary | ICD-10-CM

## 2024-02-23 DIAGNOSIS — N898 Other specified noninflammatory disorders of vagina: Secondary | ICD-10-CM | POA: Diagnosis not present

## 2024-02-23 DIAGNOSIS — Z3480 Encounter for supervision of other normal pregnancy, unspecified trimester: Secondary | ICD-10-CM

## 2024-02-23 LAB — POCT URINALYSIS DIPSTICK OB
Glucose, UA: NEGATIVE
Ketones, UA: NEGATIVE
Leukocytes, UA: NEGATIVE
Nitrite, UA: NEGATIVE

## 2024-02-23 MED ORDER — CLONAZEPAM 1 MG PO TABS: ORAL_TABLET | ORAL | 0 refills | Status: DC

## 2024-02-23 MED ORDER — VALACYCLOVIR HCL 500 MG PO TABS
500.0000 mg | ORAL_TABLET | Freq: Two times a day (BID) | ORAL | 1 refills | Status: DC
Start: 2024-02-23 — End: 2024-04-05

## 2024-02-23 NOTE — Progress Notes (Signed)
 LOW-RISK PREGNANCY VISIT Patient name: Patricia Richards MRN [id number]  Date of birth: [birth year] Chief Complaint:   Routine Prenatal Visit (Dysuria, milky white discharge, itching)  History of Present Illness:   Patricia Richards is a 33 y.o. [id number] female at [redacted]w[redacted]d with an Estimated Date of Delivery: 03/29/24 being seen today for ongoing management of a low-risk pregnancy.  Today she reports some burning w/voiding and DC . Self swab collected. Contractions: Irregular. Vag. Bleeding: None.  Movement: Present. denies leaking of fluid. Review of Systems:   Pertinent items are noted in HPI Denies abnormal vaginal discharge w/ itching/odor/irritation, headaches, visual changes, shortness of breath, chest pain, abdominal pain, severe nausea/vomiting, or problems with urination or bowel movements unless otherwise stated above. Pertinent History Reviewed:  Reviewed past medical,surgical, social, obstetrical and family history.  Reviewed problem list, medications and allergies.    Physical Assessment:     Vitals:   02/23/24 1123  BP: 114/74  Pulse: (!) 102  Weight: 182 lb (82.6 kg)  Body mass index is 31.24 kg/m.        Physical Examination:   General appearance: Well appearing, and in no distress  Mental status: Alert, oriented to person, place, and time  Skin: Warm & dry  Cardiovascular: Normal heart rate noted  Respiratory: Normal respiratory effort, no distress  Abdomen: Soft, gravid, nontender  Pelvic: Cervical exam deferred         Extremities: Edema: None Chaperone:  N/A   Fetal Status: Fetal Heart Rate (bpm): 150 Fundal Height: 35 cm Movement: Present       Results for orders placed or performed in visit on 02/23/24 (from the past 24 hours)  POC Urinalysis Dipstick OB   Collection Time: 02/23/24 11:33 AM  Result Value Ref Range   Color, UA     Clarity, UA     Glucose, UA Negative Negative   Bilirubin, UA     Ketones, UA neg    Spec Grav, UA     Blood, UA  moderate    pH, UA     POC,PROTEIN,UA Trace Negative, Trace, Small (1+), Moderate (2+), Large (3+), 4+   Urobilinogen, UA     Nitrite, UA neg    Leukocytes, UA Negative Negative   Appearance     Odor      Assessment & Plan:    Pregnancy: [id number] at [redacted]w[redacted]d 1. [redacted] weeks gestation of pregnancy (Primary)  - Cervicovaginal ancillary only - POC Urinalysis Dipstick OB - Urine Culture  2. Supervision of other normal pregnancy, antepartum   3. Vaginal discharge during pregnancy in third trimester  - Cervicovaginal ancillary only  4. Screening for STD (sexually transmitted disease)  - Cervicovaginal ancillary only  5. Vaginal itching  - Cervicovaginal ancillary only  6. Dysuria  - POC Urinalysis Dipstick OB - Urine Culture     Meds: No orders of the defined types were placed in this encounter.  Labs/procedures today: see above  Plan:  Continue routine obstetrical care = Next visit: prefers in person    Reviewed: Term labor symptoms and general obstetric precautions including but not limited to vaginal bleeding, contractions, leaking of fluid and fetal movement were reviewed in detail with the patient.  All questions were answered. Has home bp cuff.. Check bp weekly, let us  know if >140/90.   Follow-up: No follow-ups on file.  Future Appointments  Date Time Provider Department Center  02/24/2024  9:45 AM Georgina Cadet, PT [facility] None  03/06/2024 12:30 PM Alm,  Delon SAILOR, DO [facility] None  03/07/2024  3:00 PM Georgina Cadet, PT [facility] None  03/14/2024  1:30 PM Georgina Cadet, PT [facility] None  03/21/2024 10:30 AM Georgina Cadet, PT [facility] None  04/06/2024 10:15 AM Gregg Lek, MD [facility] None    Orders Placed This Encounter  Procedures   Urine Culture   POC Urinalysis Dipstick OB   Cathlean Ely DNP, CNM 02/23/2024 12:04 PM

## 2024-02-23 NOTE — Patient Instructions (Addendum)
 Start taking valtrex  (I sent in a prescription) twice a day until the baby is born   AM I IN LABOR? What is labor? Labor is the work that your body does to birth your baby. Your uterus (the womb) contracts. Your cervix (the mouth of the uterus) opens. You will push your baby out into the world.  What do contractions (labor pains) feel like? When they first start, contractions usually feel like cramps during your period. Sometimes you feel pain in your back. Most often, contractions feel like muscles pulling painfully in your lower belly. At first, the contractions will probably be 15 to 20 minutes apart. They will not feel too painful. As labor goes on, the contractions get stronger, closer together, and more painful.  How do I time the contractions? Time your contractions by counting the number of minutes from the start of one contraction to the start of the next contraction.  What should I do when the contractions start? If it is night and you can sleep, sleep. If it happens during the day, here are some things you can do to take care of yourself at home: ? Walk. If the pains you are having are real labor, walking will make the contractions come faster and harder. If the contractions are not going to continue and be real labor, walking will make the contractions slow down. ? Take a shower or bath. This will help you relax. ? Eat. Labor is a big event. It takes a lot of energy. ? Drink water . Not drinking enough water  can cause false labor (contractions that hurt but do not open your cervix). If this is true labor, drinking water  will help you have strength to get through your labor. ? Take a nap. Get all the rest you can. ? Get a massage. If your labor is in your back, a strong massage on your lower back may feel very good. Getting a foot massage is always good. ? Don't panic. You can do this. Your body was made for this. You are strong!  When should I go to the hospital or call my health  care provider? ? Your contractions have been 5 minutes apart or less for at least 1 hour. ? If several contractions are so painful you cannot walk or talk during one. ? Your bag of waters breaks. (You may have a big gush of water  or just water  that runs down your legs when you walk.)  Are there other reasons to call my health care provider? Yes, you should call your health care provider or go to the hospital if you start to bleed like you are having a period-- blood that soaks your underwear or runs down your legs, if you have sudden severe pain, if your baby has not moved for several hours, or if you are leaking green fluid. The rule is as follows: If you are very concerned about something, call.

## 2024-02-24 ENCOUNTER — Ambulatory Visit (HOSPITAL_COMMUNITY): Payer: MEDICAID | Attending: Obstetrics & Gynecology

## 2024-02-24 LAB — CERVICOVAGINAL ANCILLARY ONLY
Bacterial Vaginitis (gardnerella): NEGATIVE
Candida Glabrata: NEGATIVE
Candida Vaginitis: NEGATIVE
Chlamydia: NEGATIVE
Comment: NEGATIVE
Comment: NEGATIVE
Comment: NEGATIVE
Comment: NEGATIVE
Comment: NEGATIVE
Comment: NORMAL
Neisseria Gonorrhea: NEGATIVE
Trichomonas: NEGATIVE

## 2024-02-25 LAB — URINE CULTURE

## 2024-03-01 ENCOUNTER — Encounter: Payer: Self-pay | Admitting: Women's Health

## 2024-03-01 ENCOUNTER — Ambulatory Visit: Payer: MEDICAID | Admitting: Women's Health

## 2024-03-01 VITALS — BP 110/73 | HR 85 | Wt 180.8 lb

## 2024-03-01 DIAGNOSIS — O321XX0 Maternal care for breech presentation, not applicable or unspecified: Secondary | ICD-10-CM

## 2024-03-01 DIAGNOSIS — Z3A36 36 weeks gestation of pregnancy: Secondary | ICD-10-CM | POA: Diagnosis not present

## 2024-03-01 DIAGNOSIS — Z3483 Encounter for supervision of other normal pregnancy, third trimester: Secondary | ICD-10-CM

## 2024-03-01 DIAGNOSIS — Z3480 Encounter for supervision of other normal pregnancy, unspecified trimester: Secondary | ICD-10-CM

## 2024-03-01 NOTE — Patient Instructions (Signed)
 Patricia Richards, thank you for choosing our office today! We appreciate the opportunity to meet your healthcare needs. You may receive a short survey by mail, e-mail, or through [employer]. If you are happy with your care we would appreciate if you could take just a few minutes to complete the survey questions. We read all of your comments and take your feedback very seriously. Thank you again for choosing our office.  Center for [employer] Team at [facility]  [facility] at [facility] ([address] [place], [place] [address]) Entrance C, located off of [address] [employer] [address] valet parking   So your baby is breech? Here are some things you can try to encourage baby to turn head down: [email or web address].[email or web address] (different positions to try and more information) [facility] [place] ([phone]) $120 new patients, do not file insurances [facility] [phone]) Webster's technique- has to be done by a chiropractor certified in the technique Dive into the deep end of a swimming pool, or do a hand-stand in a swimming pool (the water  should be deep enough that your belly is completely covered)   CLASSES: Go to [email or web address] to register for classes (childbirth, breastfeeding, waterbirth, infant CPR, daddy bootcamp, etc.)  Call the office [phone]) or go to [facility] if: You begin to have strong, frequent contractions Your water  breaks.  Sometimes it is a big gush of fluid, sometimes it is just a trickle that keeps getting your panties wet or running down your legs You have vaginal bleeding.  It is normal to have a small amount of spotting if your cervix was checked.  You don't feel your baby moving like normal.  If you don't, get you something to eat and drink and lay down and focus on feeling your baby move.   If your baby is still not moving like normal, you should call the office or go to [facility].  Call the office [phone]) or  go to [facility] hospital for these signs of pre-eclampsia: Severe headache that does not go away with Tylenol  Visual changes- seeing spots, double, blurred vision Pain under your right breast or upper abdomen that does not go away with Tums or heartburn medicine Nausea and/or vomiting Severe swelling in your hands, feet, and face   Tdap Vaccine It is recommended that you get the Tdap vaccine during the third trimester of EACH pregnancy to help protect your baby from getting pertussis (whooping cough) 27-36 weeks is the BEST time to do this so that you can pass the protection on to your baby. During pregnancy is better than after pregnancy, but if you are unable to get it during pregnancy it will be offered at the hospital.  You can get this vaccine with us , at the health department, your family doctor, or some local pharmacies Everyone who will be around your baby should also be up-to-date on their vaccines before the baby comes. Adults (who are not pregnant) only need 1 dose of Tdap during adulthood.   [facility] Pediatricians/Family Doctors [facility]): [address] Luba BROCKS, [phone]           [facility]: [address], [phone]                [facility] Medicine [facility]): [address], [phone] (call to ask if accepting patients) [facility] Department: [address], [place], [phone]    [facility] Pediatricians/Family Doctors [facility]): [phone], [phone] [facility]: [address] [place], [phone] [facility]: [address], [phone]  [facility] Doctors  [facility]): [phone] [facility]: [address] [phone]   [facility] Doctors [facility]: [address], [phone]  [facility] Doctors  [facility]: [phone], [phone]  Home Blood Pressure Monitoring for Patients   Your provider has recommended that you check your blood pressure (BP) at least once a week at home. If you do not have a blood pressure cuff at home, one will be provided for you. Contact your provider if you have not received your monitor within 1 week.   Helpful Tips for Accurate Home Blood Pressure Checks  Don't smoke, exercise, or drink caffeine  30 minutes before checking your BP Use the restroom before checking your BP (a full bladder can raise your pressure) Relax in a comfortable upright chair Feet on the ground Left arm resting comfortably on a flat surface at the level of your heart Legs uncrossed Back supported Sit quietly and don't talk Place the cuff on your bare arm Adjust snuggly, so that only two fingertips can fit between your skin and the top of the cuff Check 2 readings separated by at least one minute Keep a log of your BP readings For a visual, please reference this diagram: http://ccnc.care/bpdiagram  Provider Name: [facility]     Phone: [phone]  Zone 1: ALL CLEAR  Continue to monitor your symptoms:  BP reading is less than 140 (top number) or less than 90 (bottom number)  No right upper stomach pain No headaches or seeing spots No feeling nauseated or throwing up No swelling in face and hands  Zone 2: CAUTION Call your doctor's office for any of the following:  BP reading is greater than 140 (top number) or greater than 90 (bottom number)  Stomach pain under your ribs in the middle or right side Headaches or seeing spots Feeling nauseated or throwing up Swelling in face and hands  Zone 3: EMERGENCY  Seek immediate medical care if you have any of the following:  BP reading is greater than160 (top number) or greater than 110 (bottom number) Severe headaches not improving with Tylenol  Serious difficulty catching your breath Any worsening symptoms from Zone 2   Preterm Labor and Birth Information  The normal length of a pregnancy is 39-41 weeks. Preterm labor is when labor starts before 37 completed weeks of pregnancy. What are the risk factors for preterm labor? Preterm labor is more likely to occur in women who: Have certain infections during pregnancy such as a bladder infection, sexually transmitted infection, or infection inside the uterus (chorioamnionitis). Have a shorter-than-normal cervix. Have gone into preterm labor before. Have had surgery on their cervix. Are younger than age 8 or older than age 60. Are African American. Are pregnant with twins or multiple babies (multiple gestation). Take street drugs or smoke while pregnant. Do not gain enough weight while pregnant. Became pregnant shortly after having been pregnant. What are the symptoms of preterm labor? Symptoms of preterm labor include: Cramps similar to those that can happen during a menstrual period. The cramps may happen with diarrhea. Pain in the abdomen or lower back. Regular uterine contractions that may feel like tightening of the abdomen. A feeling of increased pressure in the pelvis. Increased watery or bloody mucus discharge from the vagina. Water  breaking (  ruptured amniotic sac). Why is it important to recognize signs of preterm labor? It is important to recognize signs of preterm labor because babies who are born prematurely may not be fully developed. This can put them at an increased risk for: Long-term (chronic) heart and lung problems. Difficulty immediately after birth with regulating body systems, including blood sugar, body temperature, heart rate, and breathing rate. Bleeding in the brain. Cerebral palsy. Learning difficulties. Death. These risks are highest for babies who are born before 34 weeks of pregnancy. How is preterm labor treated? Treatment depends on the length of your pregnancy, your condition, and the health of your baby. It may  involve: Having a stitch (suture) placed in your cervix to prevent your cervix from opening too early (cerclage). Taking or being given medicines, such as: Hormone medicines. These may be given early in pregnancy to help support the pregnancy. Medicine to stop contractions. Medicines to help mature the baby's lungs. These may be prescribed if the risk of delivery is high. Medicines to prevent your baby from developing cerebral palsy. If the labor happens before 34 weeks of pregnancy, you may need to stay in the hospital. What should I do if I think I am in preterm labor? If you think that you are going into preterm labor, call your health care provider right away. How can I prevent preterm labor in future pregnancies? To increase your chance of having a full-term pregnancy: Do not use any tobacco products, such as cigarettes, chewing tobacco, and e-cigarettes. If you need help quitting, ask your health care provider. Do not use street drugs or medicines that have not been prescribed to you during your pregnancy. Talk with your health care provider before taking any herbal supplements, even if you have been taking them regularly. Make sure you gain a healthy amount of weight during your pregnancy. Watch for infection. If you think that you might have an infection, get it checked right away. Make sure to tell your health care provider if you have gone into preterm labor before. This information is not intended to replace advice given to you by your health care provider. Make sure you discuss any questions you have with your health care provider. Document Revised: 08/25/2018 Document Reviewed: 09/24/2015 Elsevier Patient Education  2020 [employer].

## 2024-03-01 NOTE — Progress Notes (Signed)
 LOW-RISK PREGNANCY VISIT Patient name: Patricia Richards MRN [id number]  Date of birth: 07-28-1990 Chief Complaint:   Routine Prenatal Visit  History of Present Illness:   Patricia Richards is a 33 y.o. [id number] female at [redacted]w[redacted]d with an Estimated Date of Delivery: 03/29/24 being seen today for ongoing management of a low-risk pregnancy.   Today she reports pelvic pressure. Contractions: Irritability. Vag. Bleeding: None.  Movement: Present. denies leaking of fluid.     09/20/2023    3:06 PM 04/07/2021    9:14 AM 02/13/2019    3:45 PM 07/26/2018   11:19 AM 03/02/2018    3:40 PM  Depression screen PHQ 2/9  Decreased Interest 2 1 0 2 1  Down, Depressed, Hopeless 2 1 0 2 1  PHQ - 2 Score 4 2 0 4 2  Altered sleeping 3 1   1   Tired, decreased energy 3 3   2   Change in appetite 3 2   0  Feeling bad or failure about yourself  3 2   1   Trouble concentrating 3 2   1   Moving slowly or fidgety/restless 3 3   0  Suicidal thoughts 0 0   0  PHQ-9 Score 22 15   7         09/20/2023    3:06 PM 04/07/2021    9:16 AM  GAD 7 : Generalized Anxiety Score  Nervous, Anxious, on Edge 3 3  Control/stop worrying 3 3  Worry too much - different things 3 3  Trouble relaxing 3 2  Restless 3 2  Easily annoyed or irritable 3 3  Afraid - awful might happen 3 3  Total GAD 7 Score 21 19      Review of Systems:   Pertinent items are noted in HPI Denies abnormal vaginal discharge w/ itching/odor/irritation, headaches, visual changes, shortness of breath, chest pain, abdominal pain, severe nausea/vomiting, or problems with urination or bowel movements unless otherwise stated above. Pertinent History Reviewed:  Reviewed past medical,surgical, social, obstetrical and family history.  Reviewed problem list, medications and allergies. Physical Assessment:   Vitals:   03/01/24 1010  BP: 110/73  Pulse: 85  Weight: 180 lb 12.8 oz (82 kg)  Body mass index is 31.03 kg/m.        Physical Examination:    General appearance: Well appearing, and in no distress  Mental status: Alert, oriented to person, place, and time  Skin: Warm & dry  Cardiovascular: Normal heart rate noted  Respiratory: Normal respiratory effort, no distress  Abdomen: Soft, gravid, nontender  Pelvic: Cervical exam performed  Dilation: 1 Effacement (%): Thick Station: Ballotable  Extremities:   *Breech confirmed by informal TA u/s, vtx in maternal RUQ*  Fetal Status: Fetal Heart Rate (bpm): 150 Fundal Height: 36 cm Movement: Present Presentation: Complete Breech  Chaperone: Alan Fischer No results found for this or any previous visit (from the past 24 hours).  Assessment & Plan:  1) Low-risk pregnancy [id number] at [redacted]w[redacted]d with an Estimated Date of Delivery: 03/29/24   2) Dep/anx, on long term klonopin   3) GBS+ urine> poc neg  4) Plans waterbirth> went to class and sent certificate, understands providers at hospital during admission will determine eligibility   5) Breech presentation> discussed ECV vs PCS, wants ECV, scheduled for 10/25 AM w/ Dr. Ozan, orders placed and form faxed   Meds: No orders of the defined types were placed in this encounter.  Labs/procedures today: SVE  Plan:  Continue routine  obstetrical care  Next visit: prefers in person    Reviewed: Preterm labor symptoms and general obstetric precautions including but not limited to vaginal bleeding, contractions, leaking of fluid and fetal movement were reviewed in detail with the patient.  All questions were answered. Does have home bp cuff. Office bp cuff given: not applicable. Check bp weekly, let us  know if consistently >140 and/or >90.  Follow-up: Return for As scheduled.  Future Appointments  Date Time Provider Department Center  03/06/2024 12:30 PM Alm Delon SAILOR, DO [facility] None  03/07/2024  3:00 PM Georgina Cadet, PT [facility] None  03/08/2024  1:50 PM Newton Mering, CNM [facility]  03/10/2024  8:00 AM [facility] SCHED ROOM  [facility] None  03/14/2024  1:30 PM Georgina Cadet, PT [facility] None  03/15/2024  1:50 PM Kizzie Suzen SAUNDERS, CNM [facility]  03/21/2024 10:30 AM Georgina Cadet, PT [facility] None  03/22/2024  1:50 PM Kizzie Suzen SAUNDERS, CNM [facility]  04/06/2024 10:15 AM Gregg Lek, MD [facility] None    No orders of the defined types were placed in this encounter.  Suzen SAUNDERS Kizzie CNM, [facility] 03/01/2024 11:21 AM

## 2024-03-02 ENCOUNTER — Telehealth (HOSPITAL_COMMUNITY): Payer: Self-pay | Admitting: *Deleted

## 2024-03-02 ENCOUNTER — Encounter (HOSPITAL_COMMUNITY): Payer: Self-pay | Admitting: *Deleted

## 2024-03-02 NOTE — Telephone Encounter (Signed)
 Preadmission screen

## 2024-03-06 ENCOUNTER — Ambulatory Visit: Payer: MEDICAID | Admitting: Dermatology

## 2024-03-07 ENCOUNTER — Encounter (HOSPITAL_COMMUNITY): Payer: MEDICAID

## 2024-03-07 ENCOUNTER — Telehealth (HOSPITAL_COMMUNITY): Payer: Self-pay

## 2024-03-07 NOTE — Telephone Encounter (Signed)
 Pt was called concerning her missed appointment this afternoon, states she has fractured her pelvis and has not been able to walk too much. Pt states she would like to keep to scheduled appointments. Pt educated on no show policy and importance of calling if she can not make it to next weeks appointment. No show #1.  Arren Laminack, PT, DPT [facility] Office: [phone] 3:50 PM, 03/07/24

## 2024-03-08 ENCOUNTER — Ambulatory Visit (INDEPENDENT_AMBULATORY_CARE_PROVIDER_SITE_OTHER): Payer: MEDICAID | Admitting: Advanced Practice Midwife

## 2024-03-08 VITALS — BP 111/70 | HR 96 | Wt 183.0 lb

## 2024-03-08 DIAGNOSIS — Z79899 Other long term (current) drug therapy: Secondary | ICD-10-CM | POA: Diagnosis not present

## 2024-03-08 DIAGNOSIS — Z3A37 37 weeks gestation of pregnancy: Secondary | ICD-10-CM | POA: Diagnosis not present

## 2024-03-08 DIAGNOSIS — Z3483 Encounter for supervision of other normal pregnancy, third trimester: Secondary | ICD-10-CM | POA: Diagnosis not present

## 2024-03-08 DIAGNOSIS — B009 Herpesviral infection, unspecified: Secondary | ICD-10-CM

## 2024-03-08 DIAGNOSIS — Z3480 Encounter for supervision of other normal pregnancy, unspecified trimester: Secondary | ICD-10-CM

## 2024-03-08 MED ORDER — CLONAZEPAM 1 MG PO TABS: ORAL_TABLET | ORAL | 0 refills | Status: DC

## 2024-03-08 NOTE — Patient Instructions (Signed)
  Things to Try After 37 weeks for Cervical Ripening (to get your cervix ready for labor) : May try one or all:     Try the [employer] at [email or web address]://glass.com/.com daily to improve baby's position and encourage the onset of labor.  Cervical Ripening: May try one or both Red Raspberry Leaf capsules or tea:  two 300mg  or 400mg  tablets with each meal, 2-3 times a day, or 1-3 cups of tea daily  Potential Side Effects Of Raspberry Leaf:  Most women do not experience any side effects from drinking raspberry leaf tea. However, nausea and loose stools are possible   Evening Primrose Oil capsules: take 1 capsule by mouth and place one capsule in the vagina every night.    Some of the potential side effects:  Upset stomach  Loose stools or diarrhea  Headaches  Nausea    3.  6 Dates a day (may taste better if warmed in microwave until soft). Found where raisins are in the grocery store

## 2024-03-08 NOTE — Progress Notes (Addendum)
 LOW-RISK PREGNANCY VISIT Patient name: Patricia Richards MRN [id number]  Date of birth: 1990/06/12 Chief Complaint:   Routine Prenatal Visit  History of Present Illness:   Patricia Richards is a 33 y.o. [id number] female at [redacted]w[redacted]d with an Estimated Date of Delivery: 03/29/24 being seen today for ongoing management of a low-risk pregnancy.  Today she reports no complaints. Contractions: Irritability. Vag. Bleeding: None.  Movement: Present. Denies leaking of fluid. Review of Systems:   Pertinent items are noted in HPI Denies abnormal vaginal discharge w/ itching/odor/irritation, headaches, visual changes, shortness of breath, chest pain, abdominal pain, severe nausea/vomiting, or problems with urination or bowel movements unless otherwise stated above. Pertinent History Reviewed:  Reviewed past medical,surgical, social, obstetrical and family history.  Reviewed problem list, medications and allergies. Physical Assessment:     Vitals:   03/08/24 1345  BP: 111/70  Pulse: 96  Weight: 183 lb (83 kg)  Body mass index is 31.41 kg/m.        Physical Examination:   General appearance: Well appearing, and in no distress  Mental status: Alert, oriented to person, place, and time  Skin: Warm & dry  Cardiovascular: Normal heart rate noted  Respiratory: Normal respiratory effort, no distress  Abdomen: Soft, gravid, nontender  Pelvic: Dilation: 1 Effacement (%): Thick Station: Ballotable  Extremities:  trace edema Chaperone: Sherrell Ely, CNM   Fetal Status: Fetal Heart Rate (bpm): 140 (bedside US  by Sherrell HOWARD)   Movement: Present Presentation: Vertex   No results found for this or any previous visit (from the past 24 hours).  Assessment & Plan:   Pregnancy: [id number] at [redacted]w[redacted]d  1. Supervision of other normal pregnancy, antepartum - Feeling well overall, reporting regular fetal movement - Breech at 36w visit, US  today confirms vertex presentation, will cancel scheduled version  2.  [redacted] weeks gestation of pregnancy (Primary) - Continue weekly visits - Elective IOL scheduled for 39.2 per request . 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 Long term prescription benzodiazepine use - Refill for 3 weeks to begin 03/15/24 - clonazePAM  (KLONOPIN ) 1 MG tablet; May fill 03/15/24  TAKE (1) TABLET BY MOUTH THREE TIMES DAILY  Dispense: 63 tablet; Refill: 0    Meds:  Meds ordered this encounter  Medications   clonazePAM  (KLONOPIN ) 1 MG tablet    Sig: May fill 03/15/24  TAKE (1) TABLET BY MOUTH THREE TIMES DAILY    Dispense:  63 tablet    Refill:  0    21d supply until next visit    Supervising Provider:   JAYNE VONN DEL [[id number]]   Labs/procedures today: none  Plan:  Continue routine obstetrical care weekly Next visit: In-person    Reviewed: Term labor symptoms and general obstetric precautions including but not limited to vaginal bleeding, contractions, leaking of fluid and fetal movement were reviewed in detail with the patient.  All questions were answered. Has home bp cuff. Check bp weekly, let us  know if >140/90.   Follow-up: No follow-ups on file.  Future Appointments  Date Time Provider Department Center  03/14/2024  1:30 PM Georgina Cadet, PT [facility] None  03/15/2024  1:50 PM Kizzie Suzen JONELLE, CNM [facility]  03/21/2024 10:30 AM Georgina Cadet, PT [facility] None  03/22/2024  1:50 PM Kizzie Suzen JONELLE, CNM [facility]  03/29/2024 10:50 AM Ely Mering, CNM [facility]  04/05/2024  6:30 AM [facility] SCHED ROOM [facility] None  04/06/2024 10:15 AM Gregg Lek, MD [facility]  None  10/11/2024  1:30 PM Alm Delon SAILOR, DO [facility] None    No orders of the defined types were placed in this encounter.  Vernell Ruddle, SNM 03/08/2024 7:09 PM

## 2024-03-10 ENCOUNTER — Observation Stay (HOSPITAL_COMMUNITY): Admission: RE | Admit: 2024-03-10 | Payer: MEDICAID | Source: Ambulatory Visit

## 2024-03-10 ENCOUNTER — Observation Stay (HOSPITAL_COMMUNITY): Admission: RE | Admit: 2024-03-10 | Payer: MEDICAID | Source: Home / Self Care | Admitting: Family Medicine

## 2024-03-12 ENCOUNTER — Ambulatory Visit: Payer: MEDICAID | Admitting: Women's Health

## 2024-03-12 ENCOUNTER — Encounter: Payer: Self-pay | Admitting: Women's Health

## 2024-03-12 VITALS — BP 113/75 | HR 97 | Wt 183.0 lb

## 2024-03-12 DIAGNOSIS — N898 Other specified noninflammatory disorders of vagina: Secondary | ICD-10-CM

## 2024-03-12 DIAGNOSIS — Z3A37 37 weeks gestation of pregnancy: Secondary | ICD-10-CM

## 2024-03-12 DIAGNOSIS — R21 Rash and other nonspecific skin eruption: Secondary | ICD-10-CM

## 2024-03-12 DIAGNOSIS — O26893 Other specified pregnancy related conditions, third trimester: Secondary | ICD-10-CM | POA: Diagnosis not present

## 2024-03-12 DIAGNOSIS — Z3483 Encounter for supervision of other normal pregnancy, third trimester: Secondary | ICD-10-CM

## 2024-03-12 LAB — POCT WET PREP (WET MOUNT)

## 2024-03-12 MED ORDER — TRIAMCINOLONE ACETONIDE 0.1 % EX OINT: 1.0000 | TOPICAL_OINTMENT | Freq: Two times a day (BID) | CUTANEOUS | 0 refills | Status: DC

## 2024-03-12 MED ORDER — METRONIDAZOLE 500 MG PO TABS: 500.0000 mg | ORAL_TABLET | Freq: Two times a day (BID) | ORAL | 0 refills | Status: DC

## 2024-03-12 NOTE — Patient Instructions (Signed)
Adean, thank you for choosing our office today! We appreciate the opportunity to meet your healthcare needs. You may receive a short survey by mail, e-mail, or through MyChart. If you are happy with your care we would appreciate if you could take just a few minutes to complete the survey questions. We read all of your comments and take your feedback very seriously. Thank you again for choosing our office.  °[facility] ° °[facility] °([address] [place], [place] [address]) °Entrance C, located off of [address] °Free 24/7 valet parking  ° °CLASSES: Go to [email or web address] to register for classes (childbirth, breastfeeding, waterbirth, infant CPR, daddy bootcamp, etc.) ° °Call the office ([phone]) or go to [facility] if: °You begin to have strong, frequent contractions °Your water breaks.  Sometimes it is a big gush of fluid, sometimes it is just a trickle that keeps getting your panties wet or running down your legs °You have vaginal bleeding.  It is normal to have a small amount of spotting if your cervix was checked.  °You don't feel your baby moving like normal.  If you don't, get you something to eat and drink and lay down and focus on feeling your baby move.   If your baby is still not moving like normal, you should call the office or go to [facility]. ° °Call the office ([phone]) or go to [facility] for these signs of pre-eclampsia: °Severe headache that does not go away with Tylenol °Visual changes- seeing spots, double, blurred vision °Pain under your right breast or upper abdomen that does not go away with Tums or heartburn medicine °Nausea and/or vomiting °Severe swelling in your hands, feet, and face  ° °[place] Pediatricians/Family Doctors °[facility] ([facility]): [address], [phone]           °[facility]: [address], [phone]                 °[facility] ([facility]): [address], [phone] (call to ask if accepting patients) °[facility]: [address], [place], [phone]   ° °[place] Pediatricians/Family Doctors °[facility] ([facility]): [address], [phone] °[facility]: [address], [phone] °[facility]: [address], [phone] ° °[place] [facility]  °[facility] ([facility]): [phone] °[facility]: [address], [phone]  ° °[place] [facility] °[facility]: [address], [phone] ° °[place] [facility]  °[facility]: [address], [phone] ° °Home Blood Pressure Monitoring for Patients  ° °Your provider has recommended that you check your blood pressure (BP) at least once a week at home. If you do not have a blood pressure cuff at home, one will be provided for you. Contact your provider if you have not received your monitor within 1 week.  ° °Helpful Tips for Accurate Home Blood Pressure Checks  °Don't smoke, exercise, or drink caffeine 30 minutes before checking your BP °Use the restroom before checking your BP (a full bladder can raise your pressure) °Relax in a comfortable upright chair °Feet on the ground °Left arm resting comfortably on a flat surface at the level of your heart °Legs uncrossed °Back supported °Sit quietly and don't talk °Place the cuff on your bare arm °Adjust snuggly, so that only two fingertips   can fit between your skin and the top of the cuff °Check 2 readings separated by at least one minute °Keep a log of your BP readings °For a visual, please reference this diagram: http://ccnc.care/bpdiagram ° °Provider Name: [facility]     Phone: [phone] ° °Zone 1: ALL CLEAR  °Continue to monitor your symptoms:  °BP reading is less than 140 (top number) or less than 90 (bottom number)  °No right  upper stomach pain °No headaches or seeing spots °No feeling nauseated or throwing up °No swelling in face and hands ° °Zone 2: CAUTION °Call your doctor's office for any of the following:  °BP reading is greater than 140 (top number) or greater than 90 (bottom number)  °Stomach pain under your ribs in the middle or right side °Headaches or seeing spots °Feeling nauseated or throwing up °Swelling in face and hands ° °Zone 3: EMERGENCY  °Seek immediate medical care if you have any of the following:  °BP reading is greater than160 (top number) or greater than 110 (bottom number) °Severe headaches not improving with Tylenol °Serious difficulty catching your breath °Any worsening symptoms from Zone 2  ° °Braxton Hicks Contractions °Contractions of the uterus can occur throughout pregnancy, but they are not always a sign that you are in labor. You may have practice contractions called Braxton Hicks contractions. These false labor contractions are sometimes confused with true labor. °What are Braxton Hicks contractions? °Braxton Hicks contractions are tightening movements that occur in the muscles of the uterus before labor. Unlike true labor contractions, these contractions do not result in opening (dilation) and thinning of the cervix. Toward the end of pregnancy (32-34 weeks), Braxton Hicks contractions can happen more often and may become stronger. These contractions are sometimes difficult to tell apart from true labor because they can be very uncomfortable. You should not feel embarrassed if you go to the hospital with false labor. °Sometimes, the only way to tell if you are in true labor is for your health care provider to look for changes in the cervix. The health care provider will do a physical exam and may monitor your contractions. If you are not in true labor, the exam should show that your cervix is not dilating and your water has not broken. °If there are no other health problems associated with your  pregnancy, it is completely safe for you to be sent home with false labor. You may continue to have Braxton Hicks contractions until you go into true labor. °How to tell the difference between true labor and false labor °True labor °Contractions last 30-70 seconds. °Contractions become very regular. °Discomfort is usually felt in the top of the uterus, and it spreads to the lower abdomen and low back. °Contractions do not go away with walking. °Contractions usually become more intense and increase in frequency. °The cervix dilates and gets thinner. °False labor °Contractions are usually shorter and not as strong as true labor contractions. °Contractions are usually irregular. °Contractions are often felt in the front of the lower abdomen and in the groin. °Contractions may go away when you walk around or change positions while lying down. °Contractions get weaker and are shorter-lasting as time goes on. °The cervix usually does not dilate or become thin. °Follow these instructions at home: ° °Take over-the-counter and prescription medicines only as told by your health care provider. °Keep up with your usual exercises and follow other instructions from your health care provider. °Eat and drink lightly if you think   you are going into labor. °If Braxton Hicks contractions are making you uncomfortable: °Change your position from lying down or resting to walking, or change from walking to resting. °Sit and rest in a tub of warm water. °Drink enough fluid to keep your urine pale yellow. Dehydration may cause these contractions. °Do slow and deep breathing several times an hour. °Keep all follow-up prenatal visits as told by your health care provider. This is important. °Contact a health care provider if: °You have a fever. °You have continuous pain in your abdomen. °Get help right away if: °Your contractions become stronger, more regular, and closer together. °You have fluid leaking or gushing from your vagina. °You pass  blood-tinged mucus (bloody show). °You have bleeding from your vagina. °You have low back pain that you never had before. °You feel your baby’s head pushing down and causing pelvic pressure. °Your baby is not moving inside you as much as it used to. °Summary °Contractions that occur before labor are called Braxton Hicks contractions, false labor, or practice contractions. °Braxton Hicks contractions are usually shorter, weaker, farther apart, and less regular than true labor contractions. True labor contractions usually become progressively stronger and regular, and they become more frequent. °Manage discomfort from Braxton Hicks contractions by changing position, resting in a warm bath, drinking plenty of water, or practicing deep breathing. °This information is not intended to replace advice given to you by your health care provider. Make sure you discuss any questions you have with your health care provider. °Document Revised: 04/15/2017 Document Reviewed: 09/16/2016 °Elsevier Patient Education © 2020 [employer] Inc[employer] °  °

## 2024-03-12 NOTE — Progress Notes (Signed)
 Work-in LOW-RISK PREGNANCY VISIT Patient name: Patricia Richards MRN [id number]  Date of birth: Oct 07, 1990 Chief Complaint:   Routine Prenatal Visit  History of Present Illness:   CHARLES NIESE is a 33 y.o. [id number] female at [redacted]w[redacted]d with an Estimated Date of Delivery: 03/29/24 being seen today for ongoing management of a low-risk pregnancy.   Today she is being seen as a work-in for itchy painful rash all over body x 2-3d, happened w/ 1st baby, dx w/ PUPPS and induced. Vaginal odor and d/c w/ itching/irritation. Contractions: Irregular. Vag. Bleeding: None.  Movement: Present. denies leaking of fluid.     09/20/2023    3:06 PM 04/07/2021    9:14 AM 02/13/2019    3:45 PM 07/26/2018   11:19 AM 03/02/2018    3:40 PM  Depression screen PHQ 2/9  Decreased Interest 2 1 0 2 1  Down, Depressed, Hopeless 2 1 0 2 1  PHQ - 2 Score 4 2 0 4 2  Altered sleeping 3 1   1   Tired, decreased energy 3 3   2   Change in appetite 3 2   0  Feeling bad or failure about yourself  3 2   1   Trouble concentrating 3 2   1   Moving slowly or fidgety/restless 3 3   0  Suicidal thoughts 0 0   0  PHQ-9 Score 22 15   7         09/20/2023    3:06 PM 04/07/2021    9:16 AM  GAD 7 : Generalized Anxiety Score  Nervous, Anxious, on Edge 3 3  Control/stop worrying 3 3  Worry too much - different things 3 3  Trouble relaxing 3 2  Restless 3 2  Easily annoyed or irritable 3 3  Afraid - awful might happen 3 3  Total GAD 7 Score 21 19      Review of Systems:   Pertinent items are noted in HPI Denies abnormal vaginal discharge w/ itching/odor/irritation, headaches, visual changes, shortness of breath, chest pain, abdominal pain, severe nausea/vomiting, or problems with urination or bowel movements unless otherwise stated above. Pertinent History Reviewed:  Reviewed past medical,surgical, social, obstetrical and family history.  Reviewed problem list, medications and allergies. Physical Assessment:   Vitals:    03/12/24 1515  BP: 113/75  Pulse: 97  Weight: 183 lb (83 kg)  Body mass index is 31.41 kg/m.        Physical Examination:   General appearance: Well appearing, and in no distress  Mental status: Alert, oriented to person, place, and time  Skin: Warm & dry, fine non coalescing red rash on entire body (co-exam w/ Ozan)   Cardiovascular: Normal heart rate noted  Respiratory: Normal respiratory effort, no distress  Abdomen: Soft, gravid, nontender  Pelvic: spec ecam: large amount thin malodorous d/c, pt requests wet prep vs CV swab  Dilation: 1.5 Effacement (%): Thick Station: Ballotable  Extremities: Edema: Trace  Fetal Status: Fetal Heart Rate (bpm): 142 Fundal Height: 37 cm Movement: Present Presentation: Vertex by informal bedside u/s  Chaperone: Rutherford Rover Results for orders placed or performed in visit on 03/12/24 (from the past 24 hours)  POCT Wet Prep [facility])   Collection Time: 03/12/24  3:47 PM  Result Value Ref Range   Source Wet Prep POC     WBC, Wet Prep HPF POC     Bacteria Wet Prep HPF POC     Bacteria Wet Prep Morphology POC  Clue Cells Wet Prep HPF POC Many (A) None   Clue Cells Wet Prep Whiff POC     Yeast Wet Prep HPF POC     KOH Wet Prep POC     Trichomonas Wet Prep HPF POC      Assessment & Plan:  1) Low-risk pregnancy [id number] at [redacted]w[redacted]d with an Estimated Date of Delivery: 03/29/24   2) BV, rx flagyl   3) 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 Ozan recommended po steroids +benadryl - pt declined, doesn't like way they may her feel, rx triamcinolone   Meds:  Meds ordered this encounter  Medications   metroNIDAZOLE  (FLAGYL ) 500 MG tablet    Sig: Take 1 tablet (500 mg total) by mouth 2 (two) times daily.    Dispense:  14 tablet    Refill:  0   triamcinolone ointment (KENALOG) 0.1 %    Sig: Apply 1  Application topically 2 (two) times daily.    Dispense:  30 g    Refill:  0   Labs/procedures today: spec exam, SVE, and wet prep  Plan:  Continue routine obstetrical care, has eIOL scheduled 11/8  Next visit: prefers in person    Reviewed: Term labor symptoms and general obstetric precautions including but not limited to vaginal bleeding, contractions, leaking of fluid and fetal movement were reviewed in detail with the patient.  All questions were answered. Does have home bp cuff. Office bp cuff given: not applicable. Check bp weekly, let us  know if consistently >140 and/or >90.  Follow-up: Return for cancel Thurs; LROB w/ CNM next Monday.  Future Appointments  Date Time Provider Department Center  03/14/2024  1:30 PM Georgina Cadet, PT [facility] None  03/19/2024 10:10 AM Kizzie Suzen SAUNDERS, CNM [facility]  03/21/2024 10:30 AM Georgina Cadet, PT [facility] None  03/22/2024  1:50 PM Kizzie Suzen SAUNDERS, CNM [facility]  03/29/2024 10:50 AM Newton Mering, CNM [facility]  04/05/2024  6:30 AM [facility] SCHED ROOM [facility] None  04/06/2024 10:15 AM Gregg Lek, MD [facility] None  10/11/2024  1:30 PM Alm Delon SAILOR, DO [facility] None    Orders Placed This Encounter  Procedures   POCT Wet Prep [facility])   Suzen SAUNDERS Kizzie CNM, [facility] 03/12/2024 4:26 PM

## 2024-03-14 ENCOUNTER — Encounter: Payer: Self-pay | Admitting: Advanced Practice Midwife

## 2024-03-14 ENCOUNTER — Encounter (HOSPITAL_COMMUNITY): Payer: MEDICAID

## 2024-03-15 ENCOUNTER — Encounter: Payer: MEDICAID | Admitting: Women's Health

## 2024-03-19 ENCOUNTER — Encounter: Payer: Self-pay | Admitting: Women's Health

## 2024-03-19 ENCOUNTER — Ambulatory Visit: Payer: MEDICAID | Admitting: Women's Health

## 2024-03-19 VITALS — BP 110/73 | HR 91 | Wt 183.0 lb

## 2024-03-19 DIAGNOSIS — O99513 Diseases of the respiratory system complicating pregnancy, third trimester: Secondary | ICD-10-CM

## 2024-03-19 DIAGNOSIS — Z3A38 38 weeks gestation of pregnancy: Secondary | ICD-10-CM

## 2024-03-19 DIAGNOSIS — R21 Rash and other nonspecific skin eruption: Secondary | ICD-10-CM

## 2024-03-19 DIAGNOSIS — N760 Acute vaginitis: Secondary | ICD-10-CM | POA: Diagnosis not present

## 2024-03-19 DIAGNOSIS — N898 Other specified noninflammatory disorders of vagina: Secondary | ICD-10-CM

## 2024-03-19 DIAGNOSIS — J029 Acute pharyngitis, unspecified: Secondary | ICD-10-CM

## 2024-03-19 DIAGNOSIS — B9689 Other specified bacterial agents as the cause of diseases classified elsewhere: Secondary | ICD-10-CM | POA: Diagnosis not present

## 2024-03-19 DIAGNOSIS — O99343 Other mental disorders complicating pregnancy, third trimester: Secondary | ICD-10-CM

## 2024-03-19 DIAGNOSIS — O99891 Other specified diseases and conditions complicating pregnancy: Secondary | ICD-10-CM

## 2024-03-19 DIAGNOSIS — F418 Other specified anxiety disorders: Secondary | ICD-10-CM

## 2024-03-19 DIAGNOSIS — Z3483 Encounter for supervision of other normal pregnancy, third trimester: Secondary | ICD-10-CM

## 2024-03-19 LAB — POCT WET PREP (WET MOUNT)
Clue Cells Wet Prep Whiff POC: NEGATIVE
Trichomonas Wet Prep HPF POC: ABSENT

## 2024-03-19 NOTE — Progress Notes (Signed)
 LOW-RISK PREGNANCY VISIT Patient name: Patricia Richards MRN [id number]  Date of birth: 06-30-1990 Chief Complaint:   Routine Prenatal Visit  History of Present Illness:   Patricia Richards is a 33 y.o. [id number] female at [redacted]w[redacted]d with an Estimated Date of Delivery: 03/29/24 being seen today for ongoing management of a low-risk pregnancy.   Today she reports rash a little better, using Aveeno and Dove soap. Throat red, painful w/ blisters x 2d, no fever. Not sure if still has bv, took 4d of pills but can't swallow now d/t throat, wants wet prep.  Contractions: Irritability. Vag. Bleeding: None.  Movement: Present. denies leaking of fluid.     09/20/2023    3:06 PM 04/07/2021    9:14 AM 02/13/2019    3:45 PM 07/26/2018   11:19 AM 03/02/2018    3:40 PM  Depression screen PHQ 2/9  Decreased Interest 2 1 0 2 1  Down, Depressed, Hopeless 2 1 0 2 1  PHQ - 2 Score 4 2 0 4 2  Altered sleeping 3 1   1   Tired, decreased energy 3 3   2   Change in appetite 3 2   0  Feeling bad or failure about yourself  3 2   1   Trouble concentrating 3 2   1   Moving slowly or fidgety/restless 3 3   0  Suicidal thoughts 0 0   0  PHQ-9 Score 22 15   7         09/20/2023    3:06 PM 04/07/2021    9:16 AM  GAD 7 : Generalized Anxiety Score  Nervous, Anxious, on Edge 3 3  Control/stop worrying 3 3  Worry too much - different things 3 3  Trouble relaxing 3 2  Restless 3 2  Easily annoyed or irritable 3 3  Afraid - awful might happen 3 3  Total GAD 7 Score 21 19      Review of Systems:   Pertinent items are noted in HPI Denies abnormal vaginal discharge w/ itching/odor/irritation, headaches, visual changes, shortness of breath, chest pain, abdominal pain, severe nausea/vomiting, or problems with urination or bowel movements unless otherwise stated above. Pertinent History Reviewed:  Reviewed past medical,surgical, social, obstetrical and family history.  Reviewed problem list, medications and  allergies. Physical Assessment:   Vitals:   03/19/24 1019  BP: 110/73  Pulse: 91  Weight: 183 lb (83 kg)  Body mass index is 31.41 kg/m.        Physical Examination:   General appearance: Well appearing, and in no distress  Mental status: Alert, oriented to person, place, and time  Skin: Warm & dry  Throat: erythematous w/ white lesions  Cardiovascular: Normal heart rate noted  Respiratory: Normal respiratory effort, no distress  Abdomen: Soft, gravid, nontender  Pelvic: Cervical exam performed  Dilation: 1.5 Effacement (%): Thick Station: Ballotable, wet prep obtained  Extremities:    Fetal Status: Fetal Heart Rate (bpm): 159 Fundal Height: 38 cm Movement: Present Presentation: Vertex  Chaperone: Alan Fischer Results for orders placed or performed in visit on 03/19/24 (from the past 24 hours)  POCT Wet Prep [facility])   Collection Time: 03/19/24 11:04 AM  Result Value Ref Range   Source Wet Prep POC vaginal    WBC, Wet Prep HPF POC none    Bacteria Wet Prep HPF POC     Bacteria Wet Prep Morphology POC     Clue Cells Wet Prep HPF POC None None  Clue Cells Wet Prep Whiff POC Negative Whiff    Yeast Wet Prep HPF POC None None   KOH Wet Prep POC     Trichomonas Wet Prep HPF POC Absent Absent    Assessment & Plan:  1) Low-risk pregnancy [id number] at [redacted]w[redacted]d with an Estimated Date of Delivery: 03/29/24   2) Resolved BV, wet prep neg (done per pt request), was only able to take 4d of metronidazole   3) 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  Labs/procedures today: SVE and wet prep  Plan: has eIOL scheduled for 11/8, will keep other  appts for now in case doesn't get called in  Reviewed: Term labor symptoms and general obstetric precautions including but not limited to vaginal bleeding, contractions, leaking of fluid and fetal movement were reviewed in detail with the patient.  All questions were answered. Does have home bp cuff. Office bp cuff given: not applicable. Check bp weekly, let us  know if consistently >140 and/or >90.  Follow-up: Return for cancel 11/6.  Future Appointments  Date Time Provider Department Center  03/21/2024 10:30 AM Georgina Cadet, PT [facility] None  03/29/2024 10:50 AM Newton Mering, CNM [facility]  04/05/2024  6:30 AM [facility] SCHED ROOM [facility] None  04/06/2024 10:15 AM Gregg Lek, MD [facility] None  10/11/2024  1:30 PM Alm Delon SAILOR, DO [facility] None    Orders Placed This Encounter  Procedures   POCT Wet Prep [facility])   Suzen JONELLE Fetters CNM, [facility] 03/19/2024 11:08 AM

## 2024-03-19 NOTE — Patient Instructions (Signed)
Adean, thank you for choosing our office today! We appreciate the opportunity to meet your healthcare needs. You may receive a short survey by mail, e-mail, or through MyChart. If you are happy with your care we would appreciate if you could take just a few minutes to complete the survey questions. We read all of your comments and take your feedback very seriously. Thank you again for choosing our office.  °[facility] ° °[facility] °([address] [place], [place] [address]) °Entrance C, located off of [address] °Free 24/7 valet parking  ° °CLASSES: Go to [email or web address] to register for classes (childbirth, breastfeeding, waterbirth, infant CPR, daddy bootcamp, etc.) ° °Call the office ([phone]) or go to [facility] if: °You begin to have strong, frequent contractions °Your water breaks.  Sometimes it is a big gush of fluid, sometimes it is just a trickle that keeps getting your panties wet or running down your legs °You have vaginal bleeding.  It is normal to have a small amount of spotting if your cervix was checked.  °You don't feel your baby moving like normal.  If you don't, get you something to eat and drink and lay down and focus on feeling your baby move.   If your baby is still not moving like normal, you should call the office or go to [facility]. ° °Call the office ([phone]) or go to [facility] for these signs of pre-eclampsia: °Severe headache that does not go away with Tylenol °Visual changes- seeing spots, double, blurred vision °Pain under your right breast or upper abdomen that does not go away with Tums or heartburn medicine °Nausea and/or vomiting °Severe swelling in your hands, feet, and face  ° °[place] Pediatricians/Family Doctors °[facility] ([facility]): [address], [phone]           °[facility]: [address], [phone]                 °[facility] ([facility]): [address], [phone] (call to ask if accepting patients) °[facility]: [address], [place], [phone]   ° °[place] Pediatricians/Family Doctors °[facility] ([facility]): [address], [phone] °[facility]: [address], [phone] °[facility]: [address], [phone] ° °[place] [facility]  °[facility] ([facility]): [phone] °[facility]: [address], [phone]  ° °[place] [facility] °[facility]: [address], [phone] ° °[place] [facility]  °[facility]: [address], [phone] ° °Home Blood Pressure Monitoring for Patients  ° °Your provider has recommended that you check your blood pressure (BP) at least once a week at home. If you do not have a blood pressure cuff at home, one will be provided for you. Contact your provider if you have not received your monitor within 1 week.  ° °Helpful Tips for Accurate Home Blood Pressure Checks  °Don't smoke, exercise, or drink caffeine 30 minutes before checking your BP °Use the restroom before checking your BP (a full bladder can raise your pressure) °Relax in a comfortable upright chair °Feet on the ground °Left arm resting comfortably on a flat surface at the level of your heart °Legs uncrossed °Back supported °Sit quietly and don't talk °Place the cuff on your bare arm °Adjust snuggly, so that only two fingertips   can fit between your skin and the top of the cuff °Check 2 readings separated by at least one minute °Keep a log of your BP readings °For a visual, please reference this diagram: http://ccnc.care/bpdiagram ° °Provider Name: [facility]     Phone: [phone] ° °Zone 1: ALL CLEAR  °Continue to monitor your symptoms:  °BP reading is less than 140 (top number) or less than 90 (bottom number)  °No right  upper stomach pain °No headaches or seeing spots °No feeling nauseated or throwing up °No swelling in face and hands ° °Zone 2: CAUTION °Call your doctor's office for any of the following:  °BP reading is greater than 140 (top number) or greater than 90 (bottom number)  °Stomach pain under your ribs in the middle or right side °Headaches or seeing spots °Feeling nauseated or throwing up °Swelling in face and hands ° °Zone 3: EMERGENCY  °Seek immediate medical care if you have any of the following:  °BP reading is greater than160 (top number) or greater than 110 (bottom number) °Severe headaches not improving with Tylenol °Serious difficulty catching your breath °Any worsening symptoms from Zone 2  ° °Braxton Hicks Contractions °Contractions of the uterus can occur throughout pregnancy, but they are not always a sign that you are in labor. You may have practice contractions called Braxton Hicks contractions. These false labor contractions are sometimes confused with true labor. °What are Braxton Hicks contractions? °Braxton Hicks contractions are tightening movements that occur in the muscles of the uterus before labor. Unlike true labor contractions, these contractions do not result in opening (dilation) and thinning of the cervix. Toward the end of pregnancy (32-34 weeks), Braxton Hicks contractions can happen more often and may become stronger. These contractions are sometimes difficult to tell apart from true labor because they can be very uncomfortable. You should not feel embarrassed if you go to the hospital with false labor. °Sometimes, the only way to tell if you are in true labor is for your health care provider to look for changes in the cervix. The health care provider will do a physical exam and may monitor your contractions. If you are not in true labor, the exam should show that your cervix is not dilating and your water has not broken. °If there are no other health problems associated with your  pregnancy, it is completely safe for you to be sent home with false labor. You may continue to have Braxton Hicks contractions until you go into true labor. °How to tell the difference between true labor and false labor °True labor °Contractions last 30-70 seconds. °Contractions become very regular. °Discomfort is usually felt in the top of the uterus, and it spreads to the lower abdomen and low back. °Contractions do not go away with walking. °Contractions usually become more intense and increase in frequency. °The cervix dilates and gets thinner. °False labor °Contractions are usually shorter and not as strong as true labor contractions. °Contractions are usually irregular. °Contractions are often felt in the front of the lower abdomen and in the groin. °Contractions may go away when you walk around or change positions while lying down. °Contractions get weaker and are shorter-lasting as time goes on. °The cervix usually does not dilate or become thin. °Follow these instructions at home: ° °Take over-the-counter and prescription medicines only as told by your health care provider. °Keep up with your usual exercises and follow other instructions from your health care provider. °Eat and drink lightly if you think   you are going into labor. °If Braxton Hicks contractions are making you uncomfortable: °Change your position from lying down or resting to walking, or change from walking to resting. °Sit and rest in a tub of warm water. °Drink enough fluid to keep your urine pale yellow. Dehydration may cause these contractions. °Do slow and deep breathing several times an hour. °Keep all follow-up prenatal visits as told by your health care provider. This is important. °Contact a health care provider if: °You have a fever. °You have continuous pain in your abdomen. °Get help right away if: °Your contractions become stronger, more regular, and closer together. °You have fluid leaking or gushing from your vagina. °You pass  blood-tinged mucus (bloody show). °You have bleeding from your vagina. °You have low back pain that you never had before. °You feel your baby’s head pushing down and causing pelvic pressure. °Your baby is not moving inside you as much as it used to. °Summary °Contractions that occur before labor are called Braxton Hicks contractions, false labor, or practice contractions. °Braxton Hicks contractions are usually shorter, weaker, farther apart, and less regular than true labor contractions. True labor contractions usually become progressively stronger and regular, and they become more frequent. °Manage discomfort from Braxton Hicks contractions by changing position, resting in a warm bath, drinking plenty of water, or practicing deep breathing. °This information is not intended to replace advice given to you by your health care provider. Make sure you discuss any questions you have with your health care provider. °Document Revised: 04/15/2017 Document Reviewed: 09/16/2016 °Elsevier Patient Education © 2020 [employer] Inc[employer] °  °

## 2024-03-21 ENCOUNTER — Encounter (HOSPITAL_COMMUNITY): Payer: MEDICAID

## 2024-03-22 ENCOUNTER — Encounter: Payer: MEDICAID | Admitting: Women's Health

## 2024-03-22 ENCOUNTER — Telehealth (HOSPITAL_COMMUNITY): Payer: Self-pay

## 2024-03-22 NOTE — Telephone Encounter (Signed)
 Pt was called concerning her missed appointment yesterday and pt states she would like to cancel all appointments until she has the baby. Pt educated she will be discharged from this episode of care and will need new referral if she be in need of therapy services in the future. Pt has not returned since the initial evaluation and treatment.    Lang Ada, PT, DPT [facility] Office: [phone] 7:44 AM, 03/22/24

## 2024-03-27 ENCOUNTER — Telehealth (HOSPITAL_COMMUNITY): Payer: Self-pay | Admitting: *Deleted

## 2024-03-27 ENCOUNTER — Encounter (HOSPITAL_COMMUNITY): Payer: Self-pay | Admitting: *Deleted

## 2024-03-27 NOTE — Telephone Encounter (Signed)
 Preadmission screen

## 2024-03-29 ENCOUNTER — Encounter (HOSPITAL_COMMUNITY): Payer: Self-pay | Admitting: Obstetrics and Gynecology

## 2024-03-29 ENCOUNTER — Encounter: Payer: MEDICAID | Admitting: Women's Health

## 2024-03-29 ENCOUNTER — Ambulatory Visit: Payer: MEDICAID | Admitting: Advanced Practice Midwife

## 2024-03-29 ENCOUNTER — Inpatient Hospital Stay (HOSPITAL_COMMUNITY)
Admission: AD | Admit: 2024-03-29 | Discharge: 2024-04-01 | DRG: 805 | Disposition: A | Discharge status: Home / Self Care | Payer: MEDICAID | Attending: Obstetrics and Gynecology | Admitting: Obstetrics and Gynecology

## 2024-03-29 DIAGNOSIS — F419 Anxiety disorder, unspecified: Secondary | ICD-10-CM

## 2024-03-29 DIAGNOSIS — Z3A40 40 weeks gestation of pregnancy: Secondary | ICD-10-CM | POA: Diagnosis not present

## 2024-03-29 DIAGNOSIS — Z3480 Encounter for supervision of other normal pregnancy, unspecified trimester: Principal | ICD-10-CM

## 2024-03-29 DIAGNOSIS — Z8249 Family history of ischemic heart disease and other diseases of the circulatory system: Secondary | ICD-10-CM

## 2024-03-29 DIAGNOSIS — O99343 Other mental disorders complicating pregnancy, third trimester: Secondary | ICD-10-CM

## 2024-03-29 DIAGNOSIS — O9902 Anemia complicating childbirth: Secondary | ICD-10-CM | POA: Diagnosis present

## 2024-03-29 DIAGNOSIS — O99824 Streptococcus B carrier state complicating childbirth: Secondary | ICD-10-CM | POA: Diagnosis present

## 2024-03-29 DIAGNOSIS — O88219 Thromboembolism in pregnancy, unspecified trimester: Secondary | ICD-10-CM | POA: Insufficient documentation

## 2024-03-29 DIAGNOSIS — R8271 Bacteriuria: Secondary | ICD-10-CM | POA: Diagnosis present

## 2024-03-29 DIAGNOSIS — D509 Iron deficiency anemia, unspecified: Secondary | ICD-10-CM | POA: Diagnosis present

## 2024-03-29 DIAGNOSIS — Z86711 Personal history of pulmonary embolism: Secondary | ICD-10-CM | POA: Insufficient documentation

## 2024-03-29 DIAGNOSIS — O26899 Other specified pregnancy related conditions, unspecified trimester: Secondary | ICD-10-CM

## 2024-03-29 DIAGNOSIS — O26893 Other specified pregnancy related conditions, third trimester: Principal | ICD-10-CM | POA: Diagnosis present

## 2024-03-29 DIAGNOSIS — Z6791 Unspecified blood type, Rh negative: Secondary | ICD-10-CM

## 2024-03-29 DIAGNOSIS — O480 Post-term pregnancy: Secondary | ICD-10-CM | POA: Diagnosis not present

## 2024-03-29 DIAGNOSIS — Z641 Problems related to multiparity: Secondary | ICD-10-CM

## 2024-03-29 DIAGNOSIS — Z3483 Encounter for supervision of other normal pregnancy, third trimester: Secondary | ICD-10-CM

## 2024-03-29 DIAGNOSIS — Z833 Family history of diabetes mellitus: Secondary | ICD-10-CM

## 2024-03-29 DIAGNOSIS — Z87891 Personal history of nicotine dependence: Secondary | ICD-10-CM

## 2024-03-29 DIAGNOSIS — O321XX0 Maternal care for breech presentation, not applicable or unspecified: Secondary | ICD-10-CM

## 2024-03-29 DIAGNOSIS — Z79899 Other long term (current) drug therapy: Secondary | ICD-10-CM

## 2024-03-29 DIAGNOSIS — A6000 Herpesviral infection of urogenital system, unspecified: Secondary | ICD-10-CM | POA: Diagnosis present

## 2024-03-29 DIAGNOSIS — I2699 Other pulmonary embolism without acute cor pulmonale: Secondary | ICD-10-CM | POA: Diagnosis not present

## 2024-03-29 DIAGNOSIS — O9832 Other infections with a predominantly sexual mode of transmission complicating childbirth: Secondary | ICD-10-CM | POA: Diagnosis present

## 2024-03-29 DIAGNOSIS — O8883 Other embolism in the puerperium: Secondary | ICD-10-CM | POA: Diagnosis not present

## 2024-03-29 NOTE — Patient Instructions (Signed)
 [facility] offers online women's holistic mental health counseling and therapy provided by licensed mental health counselors and therapists.   You can refer yourself using the link below: (if it isn't clickable from your mychart account, copy and paste it in a new browser).  If you have ANY problems, please let me know and I will help troubleshoot.   [email or web address]  OR  [email or web address]

## 2024-03-29 NOTE — MAU Note (Signed)
 Patricia Richards is a 33 y.o. at [redacted]w[redacted]d here in MAU reporting contractions since 12noon today after office visit and membrane sweep. Reports good FM and denies LOF or VB. She was 2.5cm today   LMP: na Onset of complaint: 12noon Pain score: 5 Vitals:   03/29/24 2306 03/29/24 2307  BP:  124/70  Pulse: 91   Resp: 18   Temp: 97.7 F (36.5 C)   SpO2: 99%      FHT: 177  Lab orders placed from triage: labor eval

## 2024-03-30 ENCOUNTER — Encounter (HOSPITAL_COMMUNITY): Payer: Self-pay | Admitting: Family Medicine

## 2024-03-30 ENCOUNTER — Other Ambulatory Visit: Payer: Self-pay

## 2024-03-30 ENCOUNTER — Inpatient Hospital Stay (HOSPITAL_COMMUNITY): Payer: MEDICAID

## 2024-03-30 DIAGNOSIS — Z8249 Family history of ischemic heart disease and other diseases of the circulatory system: Secondary | ICD-10-CM | POA: Diagnosis not present

## 2024-03-30 DIAGNOSIS — Z3A40 40 weeks gestation of pregnancy: Secondary | ICD-10-CM | POA: Diagnosis not present

## 2024-03-30 DIAGNOSIS — O88219 Thromboembolism in pregnancy, unspecified trimester: Secondary | ICD-10-CM | POA: Insufficient documentation

## 2024-03-30 DIAGNOSIS — O9832 Other infections with a predominantly sexual mode of transmission complicating childbirth: Secondary | ICD-10-CM | POA: Diagnosis present

## 2024-03-30 DIAGNOSIS — Z6791 Unspecified blood type, Rh negative: Secondary | ICD-10-CM | POA: Diagnosis not present

## 2024-03-30 DIAGNOSIS — O9902 Anemia complicating childbirth: Secondary | ICD-10-CM | POA: Diagnosis present

## 2024-03-30 DIAGNOSIS — O99824 Streptococcus B carrier state complicating childbirth: Secondary | ICD-10-CM | POA: Diagnosis present

## 2024-03-30 DIAGNOSIS — I2699 Other pulmonary embolism without acute cor pulmonale: Secondary | ICD-10-CM | POA: Diagnosis not present

## 2024-03-30 DIAGNOSIS — Z86711 Personal history of pulmonary embolism: Secondary | ICD-10-CM | POA: Insufficient documentation

## 2024-03-30 DIAGNOSIS — O26893 Other specified pregnancy related conditions, third trimester: Secondary | ICD-10-CM | POA: Diagnosis present

## 2024-03-30 DIAGNOSIS — D509 Iron deficiency anemia, unspecified: Secondary | ICD-10-CM | POA: Diagnosis present

## 2024-03-30 DIAGNOSIS — O8883 Other embolism in the puerperium: Secondary | ICD-10-CM | POA: Diagnosis not present

## 2024-03-30 DIAGNOSIS — A6000 Herpesviral infection of urogenital system, unspecified: Secondary | ICD-10-CM | POA: Diagnosis present

## 2024-03-30 DIAGNOSIS — Z87891 Personal history of nicotine dependence: Secondary | ICD-10-CM | POA: Diagnosis not present

## 2024-03-30 DIAGNOSIS — Z833 Family history of diabetes mellitus: Secondary | ICD-10-CM | POA: Diagnosis not present

## 2024-03-30 LAB — COMPREHENSIVE METABOLIC PANEL WITH GFR
ALT: 10 U/L (ref 0–44)
AST: 16 U/L (ref 15–41)
Albumin: 2.8 g/dL — ABNORMAL LOW (ref 3.5–5.0)
Alkaline Phosphatase: 76 U/L (ref 38–126)
Anion gap: 14 (ref 5–15)
BUN: 6 mg/dL (ref 6–20)
CO2: 19 mmol/L — ABNORMAL LOW (ref 22–32)
Calcium: 9 mg/dL (ref 8.9–10.3)
Chloride: 104 mmol/L (ref 98–111)
Creatinine, Ser: 0.67 mg/dL (ref 0.44–1.00)
GFR, Estimated: >60 mL/min (ref >60)
Glucose, Bld: 92 mg/dL (ref 70–99)
Potassium: 3.5 mmol/L (ref 3.5–5.1)
Sodium: 137 mmol/L (ref 135–145)
Total Bilirubin: 0.2 mg/dL (ref 0.0–1.2)
Total Protein: 6.2 g/dL — ABNORMAL LOW (ref 6.5–8.1)

## 2024-03-30 LAB — CBC
HCT: 31.2 % — ABNORMAL LOW (ref 36.0–46.0)
Hemoglobin: 10.5 g/dL — ABNORMAL LOW (ref 12.0–15.0)
MCH: 29.7 pg (ref 26.0–34.0)
MCHC: 33.7 g/dL (ref 30.0–36.0)
MCV: 88.1 fL (ref 80.0–100.0)
Platelets: 205 K/uL (ref 150–400)
RBC: 3.54 MIL/uL — ABNORMAL LOW (ref 3.87–5.11)
RDW: 14.3 % (ref 11.5–15.5)
WBC: 9.8 K/uL (ref 4.0–10.5)
nRBC: 0 % (ref 0.0–0.2)

## 2024-03-30 LAB — RPR: RPR Ser Ql: NONREACTIVE

## 2024-03-30 LAB — TYPE AND SCREEN
ABO/RH(D): A NEG
Antibody Screen: POSITIVE

## 2024-03-30 LAB — BRAIN NATRIURETIC PEPTIDE: B Natriuretic Peptide: 31.2 pg/mL (ref 0.0–100.0)

## 2024-03-30 MED ORDER — COCONUT OIL OIL: 1.0000 | TOPICAL_OIL | Status: DC | PRN

## 2024-03-30 MED ORDER — DIBUCAINE (PERIANAL) 1 % EX OINT: 1.0000 | TOPICAL_OINTMENT | CUTANEOUS | Status: DC | PRN

## 2024-03-30 MED ORDER — OXYCODONE-ACETAMINOPHEN 5-325 MG PO TABS: 2.0000 | ORAL_TABLET | ORAL | Status: DC | PRN

## 2024-03-30 MED ORDER — MISOPROSTOL 25 MCG QUARTER TABLET
25.0000 ug | ORAL_TABLET | ORAL | Status: DC
  Administered 2024-03-30: 25 ug via VAGINAL
  Filled 2024-03-30: qty 1

## 2024-03-30 MED ORDER — CLONAZEPAM 1 MG PO TABS: 1.0000 mg | ORAL_TABLET | Freq: Three times a day (TID) | ORAL | Status: DC

## 2024-03-30 MED ORDER — FLEET ENEMA RE ENEM: 1.0000 | ENEMA | RECTAL | Status: DC | PRN

## 2024-03-30 MED ORDER — FENTANYL CITRATE (PF) 100 MCG/2ML IJ SOLN: 100.0000 ug | INTRAMUSCULAR | Status: DC | PRN

## 2024-03-30 MED ORDER — OXYTOCIN-SODIUM CHLORIDE 30-0.9 UT/500ML-% IV SOLN: 2.5000 [IU]/h | INTRAVENOUS | Status: DC

## 2024-03-30 MED ORDER — TERBUTALINE SULFATE 1 MG/ML IJ SOLN: 0.2500 mg | Freq: Once | INTRAMUSCULAR | Status: DC | PRN

## 2024-03-30 MED ORDER — TERBUTALINE SULFATE 1 MG/ML IJ SOLN: 0.2500 mg | Freq: Once | INTRAMUSCULAR | Status: DC

## 2024-03-30 MED ORDER — PENICILLIN G POT IN DEXTROSE 60000 UNIT/ML IV SOLN
3.0000 10*6.[IU] | INTRAVENOUS | Status: DC
  Administered 2024-03-30: 3 10*6.[IU] via INTRAVENOUS
  Filled 2024-03-30: qty 50

## 2024-03-30 MED ORDER — LACTATED RINGERS IV SOLN: INTRAVENOUS | Status: DC

## 2024-03-30 MED ORDER — LIDOCAINE HCL (PF) 1 % IJ SOLN: 30.0000 mL | INTRAMUSCULAR | Status: DC | PRN

## 2024-03-30 MED ORDER — OXYTOCIN BOLUS FROM INFUSION
333.0000 mL | Freq: Once | INTRAVENOUS | Status: DC
  Administered 2024-03-30: 333 mL via INTRAVENOUS

## 2024-03-30 MED ORDER — ONDANSETRON HCL 4 MG/2ML IJ SOLN: 4.0000 mg | INTRAMUSCULAR | Status: DC | PRN

## 2024-03-30 MED ORDER — IOHEXOL 350 MG/ML SOLN
75.0000 mL | Freq: Once | INTRAVENOUS | Status: AC | PRN
  Administered 2024-03-30: 75 mL via INTRAVENOUS

## 2024-03-30 MED ORDER — ACETAMINOPHEN 325 MG PO TABS
650.0000 mg | ORAL_TABLET | ORAL | Status: DC | PRN
  Administered 2024-03-30 – 2024-04-01 (×6): 650 mg via ORAL
  Filled 2024-03-30 (×6): qty 2

## 2024-03-30 MED ORDER — HYDROXYZINE HCL 50 MG PO TABS: 50.0000 mg | ORAL_TABLET | Freq: Four times a day (QID) | ORAL | Status: DC | PRN

## 2024-03-30 MED ORDER — ACETAMINOPHEN 325 MG PO TABS: 650.0000 mg | ORAL_TABLET | ORAL | Status: DC | PRN

## 2024-03-30 MED ORDER — WITCH HAZEL-GLYCERIN EX PADS: 1.0000 | MEDICATED_PAD | CUTANEOUS | Status: DC | PRN

## 2024-03-30 MED ORDER — OXYCODONE HCL 5 MG PO TABS: 5.0000 mg | ORAL_TABLET | ORAL | Status: DC | PRN

## 2024-03-30 MED ORDER — SENNOSIDES-DOCUSATE SODIUM 8.6-50 MG PO TABS
2.0000 | ORAL_TABLET | Freq: Every day | ORAL | Status: DC
  Administered 2024-03-31: 2 via ORAL
  Filled 2024-03-30 (×2): qty 2

## 2024-03-30 MED ORDER — ALBUTEROL SULFATE (2.5 MG/3ML) 0.083% IN NEBU: 2.5000 mg | INHALATION_SOLUTION | RESPIRATORY_TRACT | Status: DC | PRN

## 2024-03-30 MED ORDER — OXYCODONE HCL 5 MG PO TABS: 10.0000 mg | ORAL_TABLET | ORAL | Status: DC | PRN

## 2024-03-30 MED ORDER — CLONAZEPAM 1 MG PO TABS: 1.0000 mg | ORAL_TABLET | Freq: Three times a day (TID) | ORAL | 0 refills | Status: AC

## 2024-03-30 MED ORDER — SIMETHICONE 80 MG PO CHEW: 80.0000 mg | CHEWABLE_TABLET | ORAL | Status: DC | PRN

## 2024-03-30 MED ORDER — CLONAZEPAM 1 MG PO TABS: ORAL_TABLET | ORAL | 0 refills | Status: DC

## 2024-03-30 MED ORDER — MISOPROSTOL 50MCG HALF TABLET
50.0000 ug | ORAL_TABLET | ORAL | Status: DC | PRN
  Administered 2024-03-30: 50 ug via ORAL
  Filled 2024-03-30: qty 1

## 2024-03-30 MED ORDER — DIPHENHYDRAMINE HCL 25 MG PO CAPS: 25.0000 mg | ORAL_CAPSULE | Freq: Four times a day (QID) | ORAL | Status: DC | PRN

## 2024-03-30 MED ORDER — SOD CITRATE-CITRIC ACID 500-334 MG/5ML PO SOLN: 30.0000 mL | ORAL | Status: DC | PRN

## 2024-03-30 MED ORDER — IBUPROFEN 600 MG PO TABS: 600.0000 mg | ORAL_TABLET | Freq: Four times a day (QID) | ORAL | Status: DC

## 2024-03-30 MED ORDER — ONDANSETRON HCL 4 MG/2ML IJ SOLN: 4.0000 mg | Freq: Four times a day (QID) | INTRAMUSCULAR | Status: DC | PRN

## 2024-03-30 MED ORDER — MEASLES, MUMPS & RUBELLA VAC ~~LOC~~ SUSR: 0.5000 mL | Freq: Once | SUBCUTANEOUS | Status: DC

## 2024-03-30 MED ORDER — BENZOCAINE-MENTHOL 20-0.5 % EX AERO
1.0000 | INHALATION_SPRAY | CUTANEOUS | Status: DC | PRN
  Filled 2024-03-30: qty 56

## 2024-03-30 MED ORDER — ZOLPIDEM TARTRATE 5 MG PO TABS: 5.0000 mg | ORAL_TABLET | Freq: Every evening | ORAL | Status: DC | PRN

## 2024-03-30 MED ORDER — PRENATAL MULTIVITAMIN CH
1.0000 | ORAL_TABLET | Freq: Every day | ORAL | Status: DC
  Administered 2024-03-31 – 2024-04-01 (×2): 1 via ORAL
  Filled 2024-03-30 (×2): qty 1

## 2024-03-30 MED ORDER — ONDANSETRON HCL 4 MG PO TABS: 4.0000 mg | ORAL_TABLET | ORAL | Status: DC | PRN

## 2024-03-30 MED ORDER — SODIUM CHLORIDE 0.9 % IV SOLN
5.0000 10*6.[IU] | Freq: Once | INTRAVENOUS | Status: AC
  Administered 2024-03-30: 5 10*6.[IU] via INTRAVENOUS
  Filled 2024-03-30: qty 5

## 2024-03-30 MED ORDER — ENOXAPARIN SODIUM 80 MG/0.8ML IJ SOSY
80.0000 mg | PREFILLED_SYRINGE | Freq: Two times a day (BID) | INTRAMUSCULAR | Status: DC
  Administered 2024-03-30 – 2024-03-31 (×2): 80 mg via SUBCUTANEOUS
  Filled 2024-03-30 (×2): qty 0.8

## 2024-03-30 MED ORDER — OXYCODONE-ACETAMINOPHEN 5-325 MG PO TABS: 1.0000 | ORAL_TABLET | ORAL | Status: DC | PRN

## 2024-03-30 MED ORDER — CLONAZEPAM 1 MG PO TABS
1.0000 mg | ORAL_TABLET | Freq: Three times a day (TID) | ORAL | Status: DC
  Administered 2024-03-30 – 2024-04-01 (×6): 1 mg via ORAL
  Filled 2024-03-30 (×6): qty 1

## 2024-03-30 MED ORDER — MEDROXYPROGESTERONE ACETATE 150 MG/ML IM SUSP: 150.0000 mg | INTRAMUSCULAR | Status: DC | PRN

## 2024-03-30 MED ORDER — LACTATED RINGERS IV SOLN: 500.0000 mL | INTRAVENOUS | Status: DC | PRN

## 2024-03-30 MED ORDER — METHYLERGONOVINE MALEATE 0.2 MG PO TABS
0.2000 mg | ORAL_TABLET | Freq: Four times a day (QID) | ORAL | Status: AC
  Administered 2024-03-30 – 2024-03-31 (×4): 0.2 mg via ORAL
  Filled 2024-03-30 (×4): qty 1

## 2024-03-30 NOTE — Progress Notes (Addendum)
 LOW-RISK PREGNANCY VISIT Patient name: Patricia Richards MRN [id number]  Date of birth: 1991-01-08 Chief Complaint:   Non-stress Test and Routine Prenatal Visit  History of Present Illness:   Patricia Richards is a 33 y.o. [id number] female at [redacted]w[redacted]d with an Estimated Date of Delivery: 03/29/24 being seen today for ongoing management of a low-risk pregnancy.  Today she reports normal pregnancy complaints.  On eIOL list, hasn't been called in.. Contractions: Irregular. Vag. Bleeding: None.  Movement: Present. denies leaking of fluid. Review of Systems:   Pertinent items are noted in HPI Denies abnormal vaginal discharge w/ itching/odor/irritation, headaches, visual changes, shortness of breath, chest pain, abdominal pain, severe nausea/vomiting, or problems with urination or bowel movements unless otherwise stated above. Pertinent History Reviewed:  Reviewed past medical,surgical, social, obstetrical and family history.  Reviewed problem list, medications and allergies.    Physical Assessment:     Vitals:   03/29/24 1131  BP: 132/82  Pulse: (!) 105  Weight: 182 lb (82.6 kg)  Body mass index is 31.24 kg/m.        Physical Examination:   General appearance: Well appearing, and in no distress  Mental status: Alert, oriented to person, place, and time  Skin: Warm & dry  Cardiovascular: Normal heart rate noted  Respiratory: Normal respiratory effort, no distress  Abdomen: Soft, gravid, nontender  Pelvic: Cervical exam performed  Dilation: 2.5 Effacement (%): Thick Station: -2  Extremities: Edema: None  membranes stripped Chaperone:  Aleck, RN   Fetal Status: Fetal Heart Rate (bpm): 145 Fundal Height: 39 cm Movement: Present Presentation: Vertex  NST: FHR baseline 145 bpm, Variability: moderate, Accelerations:present, Decelerations:  Absent= Cat 1 /Reactive    No results found for this or any previous visit (from the past 24 hours).  Assessment & Plan:    Pregnancy: [id number]  at [redacted]w[redacted]d  Discussed CBT and anxiety--[facility] info given. Has appt in January w/PCP--will take over benzo rx:    Meds ordered this encounter  Medications   clonazePAM  (KLONOPIN ) 1 MG tablet    Sig: TAKE (1) TABLET BY MOUTH THREE TIMES DAILY may fill 04-05-24    Dispense:  90 tablet    Refill:  0    30 day supply.  May fill on 04/05/24    Supervising Provider:   DUNN, KATHLEEN T [[id number]]   clonazePAM  (KLONOPIN ) 1 MG tablet    Sig: Take 1 tablet (1 mg total) by mouth 3 (three) times daily. May fill on 05/05/24.  30 day supply    Dispense:  90 tablet    Refill:  0    Supervising Provider:   ABIGAIL ROLLO DASEN [[id number]]       Meds: No orders of the defined types were placed in this encounter.  Labs/procedures today: none  Plan: waiting on eIOL.     Reviewed: Term labor symptoms and general obstetric precautions including but not limited to vaginal bleeding, contractions, leaking of fluid and fetal movement were reviewed in detail with the patient.  All questions were answered. Has home bp cuff. . Check bp weekly, let us  know if >140/90.   Follow-up: No follow-ups on file.  Future Appointments  Date Time Provider Department Center  04/05/2024  6:30 AM [facility] SCHED ROOM [facility] None  04/06/2024 10:15 AM Gregg Lek, MD [facility] None  10/11/2024  1:30 PM Alm Delon SAILOR, DO [facility] None    No orders of the defined types were placed in this encounter.  Cathlean Ely DNP, CNM 03/30/2024 12:19  AM

## 2024-03-30 NOTE — Progress Notes (Signed)
 Labor Progress Note Patricia Richards is a 33 y.o. [id number] at [redacted]w[redacted]d presented for for SOL.  S:  Patient feeling very uncomfortable with contractions, lying on her side. Requesting her bag be broken.   O:  BP (!) 98/56 (BP Location: Right Arm)   Pulse 89   Temp 97.9 F (36.6 C) (Oral)   Resp 18   Ht 5' 4 (1.626 m)   Wt 82.5 kg   LMP 06/17/2023 (Exact Date)   SpO2 99%   BMI 31.21 kg/m  EFM: 150/moderate variability/ + accels, - decels  CVE: Dilation: 3.5 Effacement (%): 50 Station: -3 Presentation: Vertex Exam by:: Mattel RN   A&P: 33 y.o. [id number] [redacted]w[redacted]d admitted for SOL.  #Labor: Cervix unchanged from prior exam. AROM'ed the patient.  #Pain: Patient in pain, but wanting to have natural water  birth.  #FWB: CAT I #GBS positive  [name], MD 5:01 AM

## 2024-03-30 NOTE — Progress Notes (Addendum)
 Called to evaluate shortness of breath immediate postpartum. Well appearing, no hypoxia, lungs clear, not in distress. Bleeding normal and not anemic to start, doubt this is 2/2 blood loss. Overall reassuring picture but as this is the immediate postpartum period PE is on our ddx as is peripartum cardiomyopathy (though no overt s/s of chf), will eval with CTA and bnp.  Update: CTA shows small PE. Patient remains minimally syptomatic. Is breastfeeding. Will plan on iv heparin overnight and if remains stable tomorrow would start apixaban then, will also f/u u/s for DVT.

## 2024-03-30 NOTE — Progress Notes (Signed)
 On admission to [facility], pt with new complaints of seeing spots and blurred vision. VS stable, initial assessment WNL, fundus firm with small bleeding present on pads. Pt had c/o SOB immediately after delivery, CMP and BNP ordered by MD [facility] and CT scan. This RN updated MD. No additional orders received.

## 2024-03-30 NOTE — H&P (Signed)
 Patricia Richards is a 33 y.o. female [phone] with IUP at [redacted]w[redacted]d by US  presenting for contractions and pressure.  Membranes stripped this am in office, on eIOL list for several days.  She reports positive fetal movement. She denies leakage of fluid or vaginal bleeding.  Prenatal History/Complications:  EAB x 1, SAB x 1, term SVB x 7 (w/ h/o A2DM x 2).    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 ,gets off street   Panic attacks    Tachycardia    Urinary tract infection    gets them frequently   Vaginal Pap smear, abnormal     Past Surgical History: Past Surgical History:  Procedure Laterality Date   DILATION AND EVACUATION  11/28/2010   WISDOM TOOTH EXTRACTION      Obstetrical History: OB History     Gravida  10   Para  7   Term  7   Preterm  0   AB  2   Living  7      SAB  1   IAB  1   Ectopic  0   Multiple  0   Live Births  7            Social History: Social History   Socioeconomic History   Marital status: Widowed    Spouse name: Darina   Number of children: 4   Years of education: Not on file   Highest education level: Not on file  Occupational History   Not on file  Tobacco Use   Smoking status: Former    Current packs/day: 0.00    Types: Cigarettes    Quit date: 12/15/2020    Years since quitting: 3.2   Smokeless tobacco: Never  Vaping Use   Vaping status: Former  Substance and Sexual Activity   Alcohol use: Not Currently    Alcohol/week: 12.0 standard drinks of alcohol    Types: 12 Cans of beer per week    Comment: During pregnancy, months ago. Malt liquour/liquor   Drug use: Not Currently    Types: Other-see comments,  Marijuana, Cocaine, MDMA (Ecstacy)    Comment: none since April 2018   Sexual activity: Yes    Birth control/protection: None, Condom  Other Topics Concern   Not on file  Social History Narrative   Not on file   Social Drivers of Health   Financial Resource Strain: Low Risk  (09/20/2023)   Overall Financial Resource Strain (CARDIA)    Difficulty of Paying Living Expenses: Not very hard  Food Insecurity: No Food Insecurity (09/20/2023)   Hunger Vital Sign    Worried About Running Out of Food in the Last Year: Never true    Ran Out of Food in the Last Year: Never true  Transportation Needs: No Transportation Needs (09/20/2023)   PRAPARE - Administrator, Civil Service (Medical): No    Lack of Transportation (Non-Medical): No  Physical Activity: Insufficiently Active (09/20/2023)   Exercise Vital Sign    Days of Exercise per Week: 1 day    Minutes  of Exercise per Session: 40 min  Stress: Stress Concern Present (09/20/2023)   [employer] of Occupational Health - Occupational Stress Questionnaire    Feeling of Stress : Very much  Social Connections: Socially Isolated (09/20/2023)   Social Connection and Isolation Panel    Frequency of Communication with Friends and Family: More than three times a week    Frequency of Social Gatherings with Friends and Family: Twice a week    Attends Religious Services: Never    Database Administrator or Organizations: No    Attends Banker Meetings: Never    Marital Status: Widowed    Family History: Family History  Problem Relation Age of Onset   Cerebral palsy Mother    Hyperlipidemia Father    Hypertension Father    Cancer Father        skin   Mental illness Father    Diabetes Father    Other Brother        problems with gums   Cancer Maternal Grandmother    Mental illness Maternal Grandmother    Stroke Maternal Grandmother    Heart attack Paternal Grandmother    Down syndrome Cousin    Bipolar disorder Cousin      Allergies: Allergies  Allergen Reactions   Procardia  Xl [Nifedipine  Er] Itching   Tramadol  Other (See Comments)    CAUSES REALLY BAD MOOD SWINGS AND HALLUCINATIONS   Reglan [Metoclopramide Hcl] Other (See Comments)    hallucinations   Toradol  [Ketorolac  Tromethamine ] Other (See Comments)    Hallucinations     Medications Prior to Admission  Medication Sig Dispense Refill Last Dose/Taking   acetaminophen  (TYLENOL ) 325 MG tablet Take 2 tablets (650 mg total) by mouth every 4 (four) hours as needed for up to 180 doses (for pain scale < 4). 180 tablet 0 03/29/2024   clonazePAM  (KLONOPIN ) 1 MG tablet May fill 03/15/24  TAKE (1) TABLET BY MOUTH THREE TIMES DAILY 63 tablet 0 03/29/2024   Blood Pressure Monitor MISC For regular home bp monitoring during pregnancy (Patient not taking: Reported on 03/29/2024) 1 each 0    metroNIDAZOLE  (FLAGYL ) 500 MG tablet Take 1 tablet (500 mg total) by mouth 2 (two) times daily. (Patient not taking: Reported on 03/29/2024) 14 tablet 0    pantoprazole  (PROTONIX ) 20 MG tablet Take 1 tablet (20 mg total) by mouth daily. 30 tablet 2    Prenatal Vit-Fe Fumarate-FA (PRENATAL VITAMIN PO) Take by mouth. (Patient not taking: Reported on 03/29/2024)      sucralfate  (CARAFATE ) 1 g tablet Take 1 tablet (1 g total) by mouth 4 (four) times daily. (Patient not taking: Reported on 03/29/2024) 120 tablet 1    triamcinolone ointment (KENALOG) 0.1 % Apply 1 Application topically 2 (two) times daily. (Patient taking differently: Apply 1 Application topically as needed.) 30 g 0    valACYclovir  (VALTREX ) 500 MG tablet Take 1 tablet (500 mg total) by mouth 2 (two) times daily. (Patient not taking: No sig reported) 60 tablet 1 More than a month    Review of Systems   Constitutional: Negative for fever and chills Eyes: Negative for visual disturbances Respiratory: Negative for shortness of breath, dyspnea Cardiovascular: Negative for chest pain or palpitations   Gastrointestinal: Negative for vomiting, diarrhea and constipation.  POSITIVE for abdominal pain (contractions) Genitourinary: Negative for dysuria and urgency Musculoskeletal: Negative for back pain, joint pain, myalgias  Neurological: Negative for dizziness and headaches  Blood pressure 116/77, pulse 88, temperature 97.7 F (36.5  C), resp. rate 18, height 5' 4 (1.626 m), weight 82.5 kg, last menstrual period 06/17/2023, SpO2 98%. General appearance: alert, cooperative, and no distress Lungs: normal respiratory effort Heart: regular rate and rhythm Abdomen: soft, non-tender; bowel sounds normal Extremities: Homans sign is negative, no sign of DVT DTR's 2+ Presentation: cephalic Fetal monitoring  Baseline: 145 bpm, Variability: Good {> 6 bpm), Accelerations: Reactive, and Decelerations: Absent Uterine activity  6-7 minutes Dilation: 3 Effacement (%): 50 Station: -2 Exam by:: Donnal Stai RN   Prenatal labs: ABO, Rh: A/Negative/-- (05/06 1625) Antibody: Negative (08/14 0810) Rubella: 1.43 (05/06 1625) RPR: Non Reactive (08/14 0810)  HBsAg: Negative (05/06 1625)  HIV: Non Reactive (08/14 0810)  GBS:   in urine  NURSING  PROVIDER  Office Location [facility] Dating by U/S at 7 wks  [facility] Model Traditional Anatomy U/S Normal female '[place]'  Initiated care at  [employer]  English              LAB RESULTS   Support Person  Genetics NIPS: LR female AFP:     NT/IT (FT only) neg    Carrier Screen Horizon: 01/05/21 neg  Rhogam  A/Negative/-- (05/06 1625) A1C/GTT Early HgbA1C: 5.2 Third trimester 2 hr GTT: normal  Flu Vaccine declined    TDaP Vaccine  12/29/23 Blood Type A/Negative/-- (05/06 1625)  RSV Vaccine  Antibody Negative (08/14 0810)  COVID Vaccine  Rubella 1.43 (05/06 1625)  Feeding Plan bottle RPR Non Reactive (08/14 0810)  Contraception NuvaRing vs BTS HBsAg Negative (05/06 1625)  Circumcision N/a HIV Non Reactive (08/14 0810)  Pediatrician   CCHD HCVAb Non Reactive (05/06 1625)  Prenatal Classes discussed    BTL Consent N/a Pap 03/22/23 neg  BTL Pre-payment  GC/CT Initial:  -/- 36wks:  -/-  VBAC Consent N/a GBS  POS (urine) For PCN allergy, check sensitivities   BRx Optimized? 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  Assessment: BEKKA QIAN is a 33 y.o. [phone] with an IUP at [redacted]w[redacted]d presenting for contractions/pressure/elective IOL  Plan: #Labor: plan oral cytotec /AROM after GBS ppx #Pain:  Plans waterbirth #FWB Cat 1 #ID: GBS: PCN  #MOF:  bottle #MOC: nuva ring    Cathlean Ely 03/30/2024, 12:07 AM

## 2024-03-30 NOTE — Progress Notes (Signed)
 PHARMACY - ANTICOAGULATION CONSULT NOTE  Pharmacy Consult for Enoxaparin Indication: pulmonary embolus  Allergies  Allergen Reactions   Procardia  Xl [Nifedipine  Er] Itching   Tramadol  Other (See Comments)    CAUSES REALLY BAD MOOD SWINGS AND HALLUCINATIONS   Reglan [Metoclopramide Hcl] Other (See Comments)    hallucinations   Toradol  [Ketorolac  Tromethamine ] Other (See Comments)    Hallucinations     Patient Measurements: Height: 5' 4 (162.6 cm) Weight: 82.5 kg (181 lb 12.8 oz) IBW/kg (Calculated) : 54.7 HEPARIN DW (KG): 72.6  Vital Signs: Temp: 98.4 F (36.9 C) (11/14 1150) Temp Source: Oral (11/14 1150) BP: 110/81 (11/14 1322) Pulse Rate: 88 (11/14 1322)  Labs: Recent Labs    03/30/24 0032 03/30/24 1124  HGB 10.5*  --   HCT 31.2*  --   PLT 205  --   CREATININE  --  0.67    Estimated Creatinine Clearance: 105.8 mL/min (by C-G formula based on SCr of 0.67 mg/dL).   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 CTA reveals small PE in patient who is breastfeeding. Pharmacy consulted to start therapeutic lovenox in the setting of acute PE.   Goal of Therapy:  Monitor platelets by anticoagulation protocol: Yes   Plan:  Start Lovenox 80 mg (1mg /kg) Q12h Monitor resolution of symptoms, CBC and s/s of bleeding  Patricia Richards 03/30/2024,3:52 PM

## 2024-03-30 NOTE — Discharge Summary (Signed)
 Postpartum Discharge Summary       Patient Name: Patricia Richards DOB: 07-15-1990 MRN: [id number]  Date of admission: 03/29/2024 Delivery date:03/30/2024 Delivering provider: DUREL CONARD DEL Date of discharge: 04/01/2024  Admitting diagnosis: Indication for care in labor or delivery [O75.9] Intrauterine pregnancy: [redacted]w[redacted]d     Secondary diagnosis:  Active Problems:   Long term prescription benzodiazepine use   Rh negative state in antepartum period   Indication for care in labor or delivery  Additional problems: None    Discharge diagnosis: Term Pregnancy Delivered                                              Post partum procedures:CT-Angio Augmentation: AROM Complications: None Hospital course: Induction of Labor With Vaginal Delivery   33 y.o. yo [id number] at [redacted]w[redacted]d was admitted to the hospital 03/29/2024 for induction of labor.  Indication for induction: Elective.  Patient had an labor course complicated by none. Membrane Rupture Time/Date: 4:56 AM,03/30/2024  Delivery Method:Vaginal, Spontaneous Operative Delivery:N/A Episiotomy: None Lacerations:  None Details of delivery can be found in separate delivery note.  Patient had a postpartum course complicated by pulmonary embolism. Patient is discharged home 04/01/24.  Newborn Data: Birth date:03/30/2024 Birth time:10:05 AM Gender:Female Living status:Living Apgars:8 ,9  Weight:8 lb 12.4 oz (3.98 kg) Magnesium  Sulfate received: No BMZ received: No Rhophylac :Yes MMR:N/A T-DaP:Given prenatally Flu: N/A RSV Vaccine received: No Transfusion:No  Immunizations received: Immunization History  Administered Date(s) Administered   Influenza,inj,Quad PF,6+ Mos 03/21/2017   MMR 12/19/2013   Rho (D) Immune Globulin  06/23/2017, 07/28/2018, 06/14/2019, 05/05/2021, 01/12/2024   Tdap 10/09/2013, 08/28/2017, 08/04/2018, 06/14/2019, 04/07/2021, 12/29/2023    Physical exam  Vitals:   03/31/24 1406 03/31/24 1625 03/31/24 2035  04/01/24 0602  BP: 110/79 115/75 115/80 97/66  Pulse: 86 85 91 85  Resp: 18 17 17 18   Temp: 97.8 F (36.6 C)  97.9 F (36.6 C) 97.8 F (36.6 C)  TempSrc: Oral  Oral Oral  SpO2: 100% 97% 99% 99%  Weight:      Height:       General: alert, cooperative, and no distress Lochia: not assessed Uterine Fundus: firm Incision: N/A DVT Evaluation: No evidence of DVT seen on physical exam. No significant calf/ankle edema.  Labs: Lab Results  Component Value Date   WBC 12.8 (H) 03/31/2024   HGB 11.2 (L) 03/31/2024   HCT 33.4 (L) 03/31/2024   MCV 88.1 03/31/2024   PLT 201 03/31/2024      Latest Ref Rng & Units 03/30/2024   11:24 AM  CMP  Glucose 70 - 99 mg/dL 92   BUN 6 - 20 mg/dL 6   Creatinine 9.55 - 8.99 mg/dL 9.32   Sodium 864 - 854 mmol/L 137   Potassium 3.5 - 5.1 mmol/L 3.5   Chloride 98 - 111 mmol/L 104   CO2 22 - 32 mmol/L 19   Calcium  8.9 - 10.3 mg/dL 9.0   Total Protein 6.5 - 8.1 g/dL 6.2   Total Bilirubin 0.0 - 1.2 mg/dL 0.2   Alkaline Phos 38 - 126 U/L 76   AST 15 - 41 U/L 16   ALT 0 - 44 U/L 10    Edinburgh Score:    04/01/2024    6:02 AM  Edinburgh Postnatal Depression Scale Screening Tool  I have been able to laugh and see the funny side  of things. --   No data recorded  After visit meds:  Allergies as of 04/01/2024       Reactions   Procardia  Xl [nifedipine  Er] Itching   Tramadol  Other (See Comments)   CAUSES REALLY BAD MOOD SWINGS AND HALLUCINATIONS   Reglan [metoclopramide Hcl] Other (See Comments)   hallucinations   Toradol  [ketorolac  Tromethamine ] Other (See Comments)   Hallucinations        Medication List     STOP taking these medications    sucralfate  1 g tablet Commonly known as: Carafate        TAKE these medications    acetaminophen  325 MG tablet Commonly known as: Tylenol  Take 2 tablets (650 mg total) by mouth every 4 (four) hours as needed for up to 180 doses (for pain scale < 4).   apixaban 5 MG Tabs  tablet Commonly known as: ELIQUIS Take 2 tablets (10 mg total) by mouth 2 (two) times daily.   apixaban 5 MG Tabs tablet Commonly known as: ELIQUIS Take 1 tablet (5 mg total) by mouth 2 (two) times daily. Start taking on: April 05, 2024   apixaban Kit Commonly known as: ELIQUIS 1 kit by Does not apply route once for 1 dose.   Blood Pressure Monitor Misc For regular home bp monitoring during pregnancy   clonazePAM  1 MG tablet Commonly known as: KLONOPIN  TAKE (1) TABLET BY MOUTH THREE TIMES DAILY may fill 04-05-24 What changed: additional instructions   clonazePAM  1 MG tablet Commonly known as: KlonoPIN  Take 1 tablet (1 mg total) by mouth 3 (three) times daily. May fill on 05/05/24.  30 day supply What changed: You were already taking a medication with the same name, and this prescription was added. Make sure you understand how and when to take each.   oxyCODONE  5 MG immediate release tablet Commonly known as: Oxy IR/ROXICODONE  Take 1 tablet (5 mg total) by mouth every 4 (four) hours as needed (pain scale 4-7).   pantoprazole  20 MG tablet Commonly known as: Protonix  Take 1 tablet (20 mg total) by mouth daily.   PRENATAL VITAMIN PO Take by mouth.   triamcinolone ointment 0.1 % Commonly known as: KENALOG Apply 1 Application topically 2 (two) times daily. What changed:  when to take this reasons to take this   valACYclovir  500 MG tablet Commonly known as: Valtrex  Take 1 tablet (500 mg total) by mouth 2 (two) times daily.      Discharge home in stable condition Infant Feeding: Breast Infant Disposition:home with mother Discharge instruction: per After Visit Summary and Postpartum booklet. Activity: Advance as tolerated. Pelvic rest for 6 weeks.  Diet: routine diet  Future Appointments: Future Appointments  Date Time Provider Department Center  04/06/2024 10:15 AM Gregg Lek, MD [facility] None  10/11/2024  1:30 PM Alm Delon SAILOR, DO [facility] None    Follow up Visit: Please schedule this patient for a In person postpartum visit in 4-6 weeks with the following provider: Suzen Fetters or Sherrell Ely. Additional Postpartum F/U:Postpartum Depression checkup  Low risk pregnancy complicated by: N/A Delivery mode:  Vaginal, Spontaneous Anticipated Birth Control:  Declined   03/30/2024 Cornell JONELLE Finder, CNM  Harlene LITTIE Duncans MSN, CNM Advanced Practice Provider, Center for [facility] 04/01/2024 8:46 AM

## 2024-03-30 NOTE — Progress Notes (Signed)
 CT showed a PE in right lung on Patricia Richards, Bilat LE venus US  pending. This RN had extensive conversations with MD Kandis and MD Anyanwu , including pharmacy consult regarding medication management moving forward. Decision was made to start lovenox BID and PO methergine  series in the setting of treating PE and Patricia Richards's increased bleeding. Both MD also state there is no additional nursing intervention needed at this time.

## 2024-03-30 NOTE — Progress Notes (Signed)
 Patient states that she is having shortness of breath on exertion when she gets up and walks to the bathroom.  I walked to patient to the bathroom with the pulse ox on, patient's heart rate increased to 115, pulse ox stayed at 98, with a slight drop to 93 while standing for a a brief moment but it came back up immediately to 98% on room air.  Dr. Lorrane notified, and instructions given to notify him if patient continue to have persistent heart racing, pain when breathing, or worsening shortness of breathe.  Will continue to monitor.     Ammara Raj, RN

## 2024-03-30 NOTE — Progress Notes (Signed)
 Pt with new complaint of headache, 5/10 severity. VS stable. CT transport at bedside for pt. Will administer medication after returning to room.

## 2024-03-30 NOTE — Progress Notes (Signed)
 Pt called this RN to room with questions. Pt felt a couple gushes and wanted assistance to restroom. RN observed a couple small clots on pad and fundus was U/E with small trickle of blood. RN and pt ambulated to bathroom, and pt felt large clot come out. Orange sized clot passed, with moderate bleeding while on toilet. Pt became dizzy when standing after using toilet, stedy used to get pt back to bed. Pads and clots weighed on triton, EBL 223. VS remained stable throughout. Pt with continued c/o seeing spots, dizziness, SOB, and generally not feeling well. Assessment now WNL. CT called, ready for pt and sending wheelchair transport.

## 2024-03-30 NOTE — Lactation Note (Signed)
 This note was copied from a baby's chart. Lactation Consultation Note  Patient Name: Patricia Richards Unijb'd Date: 03/30/2024 Age:30 hours   Mom chooses to formula feed.  Maternal Data    Feeding Nipple Type: Extra Slow Flow  LATCH Score                    Lactation Tools Discussed/Used    Interventions    Discharge    Consult Status Consult Status: Complete    Shane Badeaux G 03/30/2024, 7:36 PM

## 2024-03-31 ENCOUNTER — Inpatient Hospital Stay (HOSPITAL_COMMUNITY): Payer: MEDICAID

## 2024-03-31 DIAGNOSIS — I2699 Other pulmonary embolism without acute cor pulmonale: Secondary | ICD-10-CM

## 2024-03-31 DIAGNOSIS — Z641 Problems related to multiparity: Secondary | ICD-10-CM

## 2024-03-31 LAB — CBC
HCT: 33.4 % — ABNORMAL LOW (ref 36.0–46.0)
Hemoglobin: 11.2 g/dL — ABNORMAL LOW (ref 12.0–15.0)
MCH: 29.6 pg (ref 26.0–34.0)
MCHC: 33.5 g/dL (ref 30.0–36.0)
MCV: 88.1 fL (ref 80.0–100.0)
Platelets: 201 K/uL (ref 150–400)
RBC: 3.79 MIL/uL — ABNORMAL LOW (ref 3.87–5.11)
RDW: 14.5 % (ref 11.5–15.5)
WBC: 12.8 K/uL — ABNORMAL HIGH (ref 4.0–10.5)
nRBC: 0 % (ref 0.0–0.2)

## 2024-03-31 MED ORDER — APIXABAN 5 MG PO TABS
10.0000 mg | ORAL_TABLET | Freq: Two times a day (BID) | ORAL | Status: DC
  Administered 2024-03-31 – 2024-04-01 (×3): 10 mg via ORAL
  Filled 2024-03-31 (×4): qty 2

## 2024-03-31 MED ORDER — FERROUS GLUCONATE 324 (38 FE) MG PO TABS
324.0000 mg | ORAL_TABLET | ORAL | Status: DC
  Administered 2024-03-31: 324 mg via ORAL
  Filled 2024-03-31: qty 1

## 2024-03-31 MED ORDER — APIXABAN (ELIQUIS) EDUCATION KIT FOR DVT/PE PATIENTS
PACK | Freq: Once | Status: DC
  Filled 2024-03-31: qty 1

## 2024-03-31 MED ORDER — RHO D IMMUNE GLOBULIN 1500 UNIT/2ML IJ SOSY
300.0000 ug | PREFILLED_SYRINGE | Freq: Once | INTRAMUSCULAR | Status: AC
  Administered 2024-03-31: 300 ug via INTRAVENOUS
  Filled 2024-03-31: qty 2

## 2024-03-31 MED ORDER — APIXABAN 5 MG PO TABS: 5.0000 mg | ORAL_TABLET | Freq: Two times a day (BID) | ORAL | Status: DC

## 2024-03-31 NOTE — Progress Notes (Signed)
 POSTPARTUM PROGRESS NOTE Postpartum Day 1  Subjective: Patricia Richards is a 33 y.o. [id number] s/p SVD at [redacted]w[redacted]d.  She reports she is doing well. No acute events overnight. She denies any problems with ambulating, voiding or po intake. Denies nausea or vomiting.  Pain is well controlled.  Lochia is appropriate.  Objective: Blood pressure 110/79, pulse 86, temperature 97.8 F (36.6 C), temperature source Oral, resp. rate 18, height 5' 4 (1.626 m), weight 82.5 kg, last menstrual period 06/17/2023, SpO2 100%, unknown if currently breastfeeding.  Physical Exam:  General: alert, cooperative and no distress Heart:regular rate Resp: nonlabored Uterine Fundus: firm, appropriately tender Extremities:  none edema Skin: warm, dry  Recent Labs    03/30/24 0032  HGB 10.5*  HCT 31.2*    Assessment/Plan: Patricia Richards is a 33 y.o. [id number] s/p SVDat [redacted]w[redacted]d   PPD#1 - meeting milestones - continue routine postpartum care  Iron-deficiency anemia Hgb on admit 10.5. EBL at delivery 200, QBL 220 PP.  - po iron q2d - feeling dizzy with standing, check PPD1 CBC  Feeding: formula Contraception: TBD   Dispo: Plan for discharge PPD2.  LOS: 1 day   Barabara Maier, DO FM-OB Fellow Center for [employer]

## 2024-03-31 NOTE — Progress Notes (Signed)
 VASCULAR LAB    Bilateral lower extremity venous duplex has been performed.  See CV proc for preliminary results.   Joice Nazario, RVT 03/31/2024, 12:39 PM

## 2024-03-31 NOTE — Progress Notes (Signed)
 Patient is requesting that the infant go to the nursery because she states that she is sleepy and she is complaining of pain in her right hip and leg.  Tylenol  as ordered by MD given to patient. Infant is in the crib and the support person is present in the room, however he is sleeping. No one is available in the nursery due to nursery nurse being in a C-Section.  I updated the patient. Will continue to monitor.     Temara Lanum, RN
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
## 2024-04-01 ENCOUNTER — Encounter: Payer: Self-pay | Admitting: Internal Medicine

## 2024-04-01 ENCOUNTER — Other Ambulatory Visit (HOSPITAL_COMMUNITY): Payer: Self-pay

## 2024-04-01 LAB — RH IG WORKUP (INCLUDES ABO/RH)
Fetal Screen: NEGATIVE
Gestational Age(Wks): 40.1
Unit division: 0

## 2024-04-01 MED ORDER — APIXABAN 5 MG PO TABS
5.0000 mg | ORAL_TABLET | Freq: Two times a day (BID) | ORAL | 0 refills | Status: DC
  Filled 2024-04-01: qty 170, 85d supply, fill #0

## 2024-04-01 MED ORDER — APIXABAN (ELIQUIS) EDUCATION KIT FOR DVT/PE PATIENTS
1.0000 | PACK | Freq: Once | 0 refills | Status: AC
  Filled 2024-04-01: qty 1, 1d supply, fill #0

## 2024-04-01 MED ORDER — OXYCODONE HCL 5 MG PO TABS
5.0000 mg | ORAL_TABLET | ORAL | 0 refills | Status: DC | PRN
  Filled 2024-04-01: qty 10, 5d supply, fill #0

## 2024-04-01 MED ORDER — APIXABAN 5 MG PO TABS
ORAL_TABLET | ORAL | 0 refills | Status: DC
  Filled 2024-04-01: qty 190, 90d supply, fill #0
  Filled 2024-04-01: qty 178, 87d supply, fill #0

## 2024-04-01 NOTE — Progress Notes (Signed)
 Patient ID: ARIELL GUNNELS, female   DOB: 1990/10/18, 33 y.o.   MRN: [id number]  Post Partum Day 2 :S/P Vaginal Delivery  Subjective: Patient up ad lib, denies SOB, syncope, or dizziness. Reports consuming regular diet without issues and denies N/V. Denies issues with urination and reports bleeding is barely there.  Patient is breastfeeding and reports going well, no issues with nipples.  Desires no method for postpartum contraception.  Pain is being appropriately managed with use of tylenol , but patient feels pain is intensified with breastfeeding.  Objective: Vitals:   03/31/24 1406 03/31/24 1625 03/31/24 2035 04/01/24 0602  BP: 110/79 115/75 115/80 97/66  Pulse: 86 85 91 85  Resp: 18 17 17 18   Temp: 97.8 F (36.6 C)  97.9 F (36.6 C) 97.8 F (36.6 C)  TempSrc: Oral  Oral Oral  SpO2: 100% 97% 99% 99%  Weight:      Height:       Recent Labs    03/30/24 0032 03/31/24 1646  HGB 10.5* 11.2*  HCT 31.2* 33.4*    Physical Exam:  General: alert, cooperative, and no distress Mood/Affect: Appropriate/Flat Lungs: clear to auscultation, no wheezes, rales or rhonchi, symmetric air entry.  Heart: normal rate and regular rhythm. Breast: not examined. Abdomen:  + bowel sounds, Soft Uterine Fundus: firm, Displaced to right at U. Lochia: Not assessed Laceration: Not Assessed Skin: Warm, Dry DVT Evaluation: No evidence of DVT seen on physical exam. No significant calf/ankle edema.  Assessment S/P Vaginal Delivery-Day 2 Normal Involution BreastFeeding PE  Plan: -Informed that oxycodone  is available, but she needs to request from nurse.  -However, will send script to pharmacy. -Discussed Eliquis administration.  Informed that she is expected to take for 90 days at least.  -Patient requests medications be sent to pharmacy near hospital. -Will send via M2B program.  -L&D team to be updated on patient status.   Harlene LITTIE Duncans, MSN, CNM 04/01/2024, 8:16 AM

## 2024-04-02 ENCOUNTER — Encounter: Payer: Self-pay | Admitting: Women's Health

## 2024-04-03 ENCOUNTER — Telehealth: Payer: Self-pay

## 2024-04-03 DIAGNOSIS — R0609 Other forms of dyspnea: Secondary | ICD-10-CM

## 2024-04-03 NOTE — Telephone Encounter (Signed)
 Echo order placed per providers requested.

## 2024-04-04 NOTE — Addendum Note (Signed)
 Addended by: KENETH KNEE C on: 04/04/2024 02:22 PM   Modules accepted: Orders

## 2024-04-05 ENCOUNTER — Inpatient Hospital Stay (HOSPITAL_COMMUNITY): Payer: MEDICAID

## 2024-04-05 ENCOUNTER — Inpatient Hospital Stay (HOSPITAL_COMMUNITY)
Admission: AD | Admit: 2024-04-05 | Discharge: 2024-04-05 | Disposition: A | Discharge status: Home / Self Care | Payer: MEDICAID | Attending: Obstetrics & Gynecology | Admitting: Obstetrics & Gynecology

## 2024-04-05 ENCOUNTER — Encounter (HOSPITAL_COMMUNITY): Payer: Self-pay | Admitting: Obstetrics & Gynecology

## 2024-04-05 ENCOUNTER — Other Ambulatory Visit: Payer: Self-pay | Admitting: Obstetrics & Gynecology

## 2024-04-05 ENCOUNTER — Inpatient Hospital Stay (HOSPITAL_COMMUNITY): Admission: RE | Admit: 2024-04-05 | Payer: MEDICAID | Source: Home / Self Care | Admitting: Family Medicine

## 2024-04-05 ENCOUNTER — Ambulatory Visit (INDEPENDENT_AMBULATORY_CARE_PROVIDER_SITE_OTHER): Payer: MEDICAID | Admitting: *Deleted

## 2024-04-05 ENCOUNTER — Other Ambulatory Visit: Payer: Self-pay

## 2024-04-05 ENCOUNTER — Other Ambulatory Visit: Payer: Self-pay | Admitting: Advanced Practice Midwife

## 2024-04-05 VITALS — BP 126/87 | HR 88

## 2024-04-05 DIAGNOSIS — R0602 Shortness of breath: Secondary | ICD-10-CM | POA: Insufficient documentation

## 2024-04-05 DIAGNOSIS — Z8659 Personal history of other mental and behavioral disorders: Secondary | ICD-10-CM

## 2024-04-05 DIAGNOSIS — Z86711 Personal history of pulmonary embolism: Secondary | ICD-10-CM | POA: Diagnosis not present

## 2024-04-05 DIAGNOSIS — R0789 Other chest pain: Secondary | ICD-10-CM | POA: Insufficient documentation

## 2024-04-05 DIAGNOSIS — O9089 Other complications of the puerperium, not elsewhere classified: Secondary | ICD-10-CM | POA: Diagnosis not present

## 2024-04-05 DIAGNOSIS — Z7901 Long term (current) use of anticoagulants: Secondary | ICD-10-CM | POA: Insufficient documentation

## 2024-04-05 DIAGNOSIS — Z0130 Encounter for examination of blood pressure without abnormal findings: Secondary | ICD-10-CM

## 2024-04-05 LAB — URINALYSIS, ROUTINE W REFLEX MICROSCOPIC
Bacteria, UA: NONE SEEN
Bilirubin Urine: NEGATIVE
Glucose, UA: NEGATIVE mg/dL
Ketones, ur: NEGATIVE mg/dL
Leukocytes,Ua: NEGATIVE
Nitrite: NEGATIVE
Protein, ur: NEGATIVE mg/dL
Specific Gravity, Urine: 1.009 (ref 1.005–1.030)
pH: 7 (ref 5.0–8.0)

## 2024-04-05 LAB — WET PREP, GENITAL
Clue Cells Wet Prep HPF POC: NONE SEEN
Sperm: NONE SEEN
Trich, Wet Prep: NONE SEEN
WBC, Wet Prep HPF POC: <10 (ref <10)
Yeast Wet Prep HPF POC: NONE SEEN

## 2024-04-05 LAB — TROPONIN I (HIGH SENSITIVITY): Troponin I (High Sensitivity): 2 ng/L (ref <18)

## 2024-04-05 LAB — COMPREHENSIVE METABOLIC PANEL WITH GFR
ALT: 26 U/L (ref 0–44)
AST: 31 U/L (ref 15–41)
Albumin: 3.6 g/dL (ref 3.5–5.0)
Alkaline Phosphatase: 78 U/L (ref 38–126)
Anion gap: 11 (ref 5–15)
BUN: 10 mg/dL (ref 6–20)
CO2: 25 mmol/L (ref 22–32)
Calcium: 10 mg/dL (ref 8.9–10.3)
Chloride: 100 mmol/L (ref 98–111)
Creatinine, Ser: 0.58 mg/dL (ref 0.44–1.00)
GFR, Estimated: >60 mL/min (ref >60)
Glucose, Bld: 91 mg/dL (ref 70–99)
Potassium: 3.8 mmol/L (ref 3.5–5.1)
Sodium: 136 mmol/L (ref 135–145)
Total Bilirubin: 0.5 mg/dL (ref 0.0–1.2)
Total Protein: 7.5 g/dL (ref 6.5–8.1)

## 2024-04-05 LAB — CBC
HCT: 33.8 % — ABNORMAL LOW (ref 36.0–46.0)
Hemoglobin: 10.9 g/dL — ABNORMAL LOW (ref 12.0–15.0)
MCH: 28.8 pg (ref 26.0–34.0)
MCHC: 32.2 g/dL (ref 30.0–36.0)
MCV: 89.2 fL (ref 80.0–100.0)
Platelets: 294 K/uL (ref 150–400)
RBC: 3.79 MIL/uL — ABNORMAL LOW (ref 3.87–5.11)
RDW: 13.7 % (ref 11.5–15.5)
WBC: 7 K/uL (ref 4.0–10.5)
nRBC: 0 % (ref 0.0–0.2)

## 2024-04-05 LAB — BRAIN NATRIURETIC PEPTIDE: B Natriuretic Peptide: 17.5 pg/mL (ref 0.0–100.0)

## 2024-04-05 MED ORDER — AMLODIPINE BESYLATE 2.5 MG PO TABS: 2.5000 mg | ORAL_TABLET | Freq: Every day | ORAL | 11 refills | Status: DC

## 2024-04-05 MED ORDER — PHEXXI 1.8-1-0.4 % VA GEL: VAGINAL | 12 refills | Status: DC

## 2024-04-05 MED ORDER — OXYCODONE HCL 5 MG PO TABS: 5.0000 mg | ORAL_TABLET | Freq: Three times a day (TID) | ORAL | 0 refills | Status: DC | PRN

## 2024-04-05 MED ORDER — OXYCODONE-ACETAMINOPHEN 5-325 MG PO TABS: 2.0000 | ORAL_TABLET | Freq: Once | ORAL | Status: DC

## 2024-04-05 MED ORDER — IOHEXOL 350 MG/ML SOLN
75.0000 mL | Freq: Once | INTRAVENOUS | Status: AC | PRN
  Administered 2024-04-05: 75 mL via INTRAVENOUS

## 2024-04-05 NOTE — MAU Note (Signed)
 Patricia Richards is a 33 y.o. at Unknown here in MAU reporting: she has a blood clot in her right lung and is having SOB.  Reports has pain from under right breast down toward her RLQ, unsure if it's related to blood clot. States taking Eliquis as prescribed, last taken @ 0900 this morning.  Also c/o mid upper back pain that dull and constant.  Reports took Tylenol  for pain last night, some relief noted.  S/P NVD 03/30/2024  LMP: NA Onset of complaint: Tuesday Pain score: 5 Vitals:   04/05/24 1314  BP: 119/77  Pulse: 78  Resp: 20  Temp: 98.2 F (36.8 C)  SpO2: 100%     FHT: NA  Lab orders placed from triage: None

## 2024-04-05 NOTE — Progress Notes (Signed)
 Saw RN/Ozan for BP check.  Anxious about PE.  Feels much better now physically (no SOB, back pain). Pulse is normal. Ozan rx'd BP med d/t home BP being a little high and to help decrease pt's anxiety about it being high.  [facility] appt set up for pt for Monday at 6pm.

## 2024-04-05 NOTE — Progress Notes (Signed)
 See RN note Rx sent in for Norvasc Plan to f/u in 1 wks  Jaidence Geisler, DO Attending Obstetrician & Gynecologist, [facility] for [facility], [facility]
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
## 2024-04-05 NOTE — Progress Notes (Signed)
   NURSE VISIT- BLOOD PRESSURE CHECK  SUBJECTIVE:  Patricia Richards is a 33 y.o. [id number] female here for BP check. She is postpartum, delivery date 03/30/24    HYPERTENSION ROS:  Pregnant/postpartum:  Severe headaches that don't go away with tylenol /other medicines: No  Visual changes (seeing spots/double/blurred vision) No  Severe pain under right breast breast or in center of upper chest No  Severe nausea/vomiting No  Taking medicines as instructed yes    OBJECTIVE:  BP 126/87 (BP Location: Right Arm, Patient Position: Sitting, Cuff Size: Normal)   Pulse 88   LMP 06/17/2023 (Exact Date)   Breastfeeding No   Appearance alert, well appearing, and in no distress.  ASSESSMENT: Postpartum  blood pressure check  PLAN: Discussed with Dr. Ozan   Recommendations: new prescription will be sent   Follow-up: as scheduled   Alan LITTIE Fischer  04/05/2024 10:28 AM

## 2024-04-06 ENCOUNTER — Ambulatory Visit: Payer: MEDICAID

## 2024-04-06 ENCOUNTER — Encounter: Payer: Self-pay | Admitting: Neurology

## 2024-04-06 ENCOUNTER — Ambulatory Visit: Payer: MEDICAID | Admitting: Neurology

## 2024-04-11 ENCOUNTER — Ambulatory Visit: Payer: MEDICAID

## 2024-04-16 ENCOUNTER — Ambulatory Visit: Payer: MEDICAID | Admitting: *Deleted

## 2024-04-16 VITALS — BP 111/80 | HR 93

## 2024-04-16 DIAGNOSIS — Z0130 Encounter for examination of blood pressure without abnormal findings: Secondary | ICD-10-CM

## 2024-04-16 DIAGNOSIS — Z8679 Personal history of other diseases of the circulatory system: Secondary | ICD-10-CM

## 2024-04-16 NOTE — Progress Notes (Signed)
   NURSE VISIT- BLOOD PRESSURE CHECK  SUBJECTIVE:  Patricia Richards is a 33 y.o. [id number] female here for BP check. She is postpartum, delivery date 03/30/24.  Not taking Amlodipine  as she did not want to take medication she didn't need.  HYPERTENSION ROS:  Postpartum:  Severe headaches that don't go away with tylenol /other medicines: No  Visual changes (seeing spots/double/blurred vision) still seeing spots but not worse Severe pain under right breast breast or in center of upper chest No  Severe nausea/vomiting No  Taking medicines as instructed no  OBJECTIVE:  BP 111/80 (BP Location: Right Arm, Patient Position: Sitting, Cuff Size: Normal)   Pulse 93   SpO2 95%   Breastfeeding No   Appearance alert, well appearing, and in no distress.  ASSESSMENT: Postpartum  blood pressure check  PLAN: Discussed with Dr. Ozan   Recommendations: no changes needed, can continue not taking Amlodipine .  Follow-up: as scheduled    Abisai Coble  04/16/2024 4:22 PM

## 2024-04-18 ENCOUNTER — Inpatient Hospital Stay (HOSPITAL_COMMUNITY)
Admission: AD | Admit: 2024-04-18 | Discharge: 2024-04-18 | Disposition: A | Discharge status: Home / Self Care | Payer: MEDICAID | Attending: Obstetrics and Gynecology | Admitting: Obstetrics and Gynecology

## 2024-04-18 ENCOUNTER — Encounter (HOSPITAL_COMMUNITY): Payer: Self-pay | Admitting: Obstetrics and Gynecology

## 2024-04-18 DIAGNOSIS — O88219 Thromboembolism in pregnancy, unspecified trimester: Secondary | ICD-10-CM

## 2024-04-18 DIAGNOSIS — R0602 Shortness of breath: Secondary | ICD-10-CM

## 2024-04-18 DIAGNOSIS — R079 Chest pain, unspecified: Secondary | ICD-10-CM

## 2024-04-18 DIAGNOSIS — M79602 Pain in left arm: Secondary | ICD-10-CM

## 2024-04-18 DIAGNOSIS — M549 Dorsalgia, unspecified: Secondary | ICD-10-CM

## 2024-04-18 HISTORY — DX: Other pulmonary embolism without acute cor pulmonale: I26.99

## 2024-04-18 LAB — COMPREHENSIVE METABOLIC PANEL WITH GFR
ALT: 19 U/L (ref 0–44)
AST: 18 U/L (ref 15–41)
Albumin: 3.6 g/dL (ref 3.5–5.0)
Alkaline Phosphatase: 55 U/L (ref 38–126)
Anion gap: 12 (ref 5–15)
BUN: 9 mg/dL (ref 6–20)
CO2: 25 mmol/L (ref 22–32)
Calcium: 9 mg/dL (ref 8.9–10.3)
Chloride: 103 mmol/L (ref 98–111)
Creatinine, Ser: 0.88 mg/dL (ref 0.44–1.00)
GFR, Estimated: >60 mL/min (ref >60)
Glucose, Bld: 95 mg/dL (ref 70–99)
Potassium: 3.7 mmol/L (ref 3.5–5.1)
Sodium: 140 mmol/L (ref 135–145)
Total Bilirubin: 0.5 mg/dL (ref 0.0–1.2)
Total Protein: 6.4 g/dL — ABNORMAL LOW (ref 6.5–8.1)

## 2024-04-18 LAB — CBC
HCT: 35.2 % — ABNORMAL LOW (ref 36.0–46.0)
Hemoglobin: 11.3 g/dL — ABNORMAL LOW (ref 12.0–15.0)
MCH: 28.3 pg (ref 26.0–34.0)
MCHC: 32.1 g/dL (ref 30.0–36.0)
MCV: 88.2 fL (ref 80.0–100.0)
Platelets: 284 K/uL (ref 150–400)
RBC: 3.99 MIL/uL (ref 3.87–5.11)
RDW: 13.3 % (ref 11.5–15.5)
WBC: 6.8 K/uL (ref 4.0–10.5)
nRBC: 0 % (ref 0.0–0.2)

## 2024-04-18 LAB — D-DIMER, QUANTITATIVE: D-Dimer, Quant: <0.27 ug{FEU}/mL (ref 0.00–0.50)

## 2024-04-18 LAB — TROPONIN I (HIGH SENSITIVITY): Troponin I (High Sensitivity): <2 ng/L (ref <18)

## 2024-04-18 MED ORDER — PHEXXI 1.8-1-0.4 % VA GEL: VAGINAL | 15 refills | Status: DC

## 2024-04-18 NOTE — Progress Notes (Signed)
 Written and verbal d/c instructions given and pt voiced understanding.
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
## 2024-04-18 NOTE — MAU Note (Addendum)
 Patricia Richards is a 33 y.o. at Unknown here in MAU reporting thinking maybe her anxiety has kicked in some. Her significant other had to go back to work to [employer] and pt is at home with the children. Her B/P at home per pt was 158/108. She called EMS who took her B/P 3 times and got 120s/80s. Pt had PE 03/30/24 and is currently on blood thinners. States 6 days later it was gone but pt is still on blood thinners. Having upper back pain that radiates down her L arm. Some numbness to her L arm at times. States since this wkend she has some chest discomfort and she is more winded. She is still unsure if all is related to anxiety as she was doing well last wk. States this was a lot of tension between pt and her boyfriend all wkend before he left Monday morning. Back pain and shoulder pain worse with movement.   LMP: na Onset of complaint: Saturday Pain score: 7 Vitals:   04/18/24 0159 04/18/24 0202  BP:  127/83  Pulse: 71   Resp: 17   Temp: 97.7 F (36.5 C)   SpO2: 100%      FHT: na  Lab orders placed from triage: none

## 2024-04-20 ENCOUNTER — Other Ambulatory Visit: Payer: Self-pay | Admitting: Advanced Practice Midwife

## 2024-04-25 ENCOUNTER — Ambulatory Visit (HOSPITAL_COMMUNITY)
Admission: RE | Admit: 2024-04-25 | Discharge: 2024-04-25 | Disposition: A | Discharge status: Home / Self Care | Payer: MEDICAID | Source: Ambulatory Visit | Attending: Student | Admitting: Student

## 2024-04-25 ENCOUNTER — Other Ambulatory Visit: Payer: Self-pay | Admitting: Obstetrics & Gynecology

## 2024-04-25 DIAGNOSIS — R0609 Other forms of dyspnea: Secondary | ICD-10-CM | POA: Insufficient documentation

## 2024-04-25 LAB — ECHOCARDIOGRAM COMPLETE
AR max vel: 1.92 cm2
AV Area VTI: 2.11 cm2
AV Area mean vel: 2.06 cm2
AV Mean grad: 3.1 mmHg
AV Peak grad: 6.5 mmHg
Ao pk vel: 1.28 m/s
Area-P 1/2: 2.42 cm2
S' Lateral: 2.6 cm

## 2024-04-25 NOTE — Progress Notes (Signed)
*  PRELIMINARY RESULTS* Echocardiogram 2D Echocardiogram has been performed.  Patricia Richards 04/25/2024, 4:14 PM

## 2024-04-26 ENCOUNTER — Ambulatory Visit: Payer: Self-pay | Admitting: Internal Medicine

## 2024-04-26 ENCOUNTER — Encounter: Payer: Self-pay | Admitting: Women's Health

## 2024-04-26 ENCOUNTER — Encounter: Payer: Self-pay | Admitting: Internal Medicine

## 2024-05-01 ENCOUNTER — Encounter: Payer: Self-pay | Admitting: Women's Health

## 2024-05-07 ENCOUNTER — Encounter: Payer: Self-pay | Admitting: Women's Health

## 2024-05-07 ENCOUNTER — Other Ambulatory Visit: Payer: Self-pay | Admitting: Women's Health

## 2024-05-07 ENCOUNTER — Ambulatory Visit: Payer: MEDICAID | Admitting: Women's Health

## 2024-05-07 DIAGNOSIS — Z7901 Long term (current) use of anticoagulants: Secondary | ICD-10-CM | POA: Diagnosis not present

## 2024-05-07 DIAGNOSIS — Z1332 Encounter for screening for maternal depression: Secondary | ICD-10-CM

## 2024-05-07 DIAGNOSIS — Z30011 Encounter for initial prescription of contraceptive pills: Secondary | ICD-10-CM | POA: Diagnosis not present

## 2024-05-07 DIAGNOSIS — H539 Unspecified visual disturbance: Secondary | ICD-10-CM | POA: Diagnosis not present

## 2024-05-07 DIAGNOSIS — Z8759 Personal history of other complications of pregnancy, childbirth and the puerperium: Secondary | ICD-10-CM

## 2024-05-07 DIAGNOSIS — O99893 Other specified diseases and conditions complicating puerperium: Secondary | ICD-10-CM | POA: Diagnosis not present

## 2024-05-07 DIAGNOSIS — Z86711 Personal history of pulmonary embolism: Secondary | ICD-10-CM

## 2024-05-07 DIAGNOSIS — R42 Dizziness and giddiness: Secondary | ICD-10-CM | POA: Diagnosis not present

## 2024-05-07 MED ORDER — SLYND 4 MG PO TABS: 1.0000 | ORAL_TABLET | Freq: Every day | ORAL | 3 refills | Status: DC

## 2024-05-07 MED ORDER — SCOPOLAMINE 1 MG/3DAYS TD PT72: 1.0000 | MEDICATED_PATCH | TRANSDERMAL | 0 refills | Status: AC

## 2024-05-07 NOTE — Progress Notes (Signed)
 "  POSTPARTUM VISIT Patient name: Patricia Richards MRN [id number]  Date of birth: June 20, 1990 Chief Complaint:   Postpartum Care  History of Present Illness:   Patricia Richards is a 33 y.o. [id number] Caucasian female being seen today for a postpartum visit. She is 5 weeks postpartum following a spontaneous vaginal delivery waterbirth at 40.1 gestational weeks. IOL: no, for n/a. Anesthesia: none.  Laceration: none.  Complications: immediate SOB upon standing to get out of tub, PE dx and started on eloquis. Inpatient contraception: no.   Pregnancy complicated by klonopin  use, dizziness/seeing spots, IDA. Tobacco use: no. Substance use disorder: hx, none current. Last pap smear: 03/22/23 and results were neg. Next pap smear due: 2027 Patient's last menstrual period was 06/17/2023 (exact date).  Postpartum course has been complicated by immediate pp PE, on eloquis. Reports sob/seeing spots that happened entire pregnancy improved briefly, but have returned. Had echo 12/10 d/t DOE, results wnl.  Bleeding none. Bowel function is normal. Bladder function is normal. Urinary incontinence? no, fecal incontinence? no Patient is sexually active. Last sexual activity: 2d ago, didn't use anything. Desired contraception: POPs. Patient does want a pregnancy in the future.  Desired family size is 9 children.   Upstream - 05/07/24 1059       Pregnancy Intention Screening   Does the patient want to become pregnant in the next year? Ok Either Way    Does the patient's partner want to become pregnant in the next year? Ok Either Way    Would the patient like to discuss contraceptive options today? No      Contraception Wrap Up   Current Method No Method - Other Reason    End Method No Method - Other Reason    Contraception Counseling Provided No         The pregnancy intention screening data noted above was reviewed. Potential methods of contraception were discussed. The patient elected to proceed with No Method -  Other Reason.  Edinburgh Postpartum Depression Screening: equivocal, more anxiety than depression, on klonopin . Denies SI/HI/II.   Edinburgh Postnatal Depression Scale - 05/07/24 1043       Edinburgh Postnatal Depression Scale:  In the Past 7 Days   I have been able to laugh and see the funny side of things. 0    I have looked forward with enjoyment to things. 0    I have blamed myself unnecessarily when things went wrong. 1    I have been anxious or worried for no good reason. 3    I have felt scared or panicky for no good reason. 3    Things have been getting on top of me. 1    I have been so unhappy that I have had difficulty sleeping. 0    I have felt sad or miserable. 1    I have been so unhappy that I have been crying. 1    The thought of harming myself has occurred to me. 0    Edinburgh Postnatal Depression Scale Total 10             09/20/2023    3:06 PM 04/07/2021    9:16 AM  GAD 7 : Generalized Anxiety Score  Nervous, Anxious, on Edge 3 3  Control/stop worrying 3 3  Worry too much - different things 3 3  Trouble relaxing 3 2  Restless 3 2  Easily annoyed or irritable 3 3  Afraid - awful might happen 3 3  Total GAD 7  Score 21 19     Baby's course has been uncomplicated. Baby is feeding by bottle. Infant has a pediatrician/family doctor? Yes.   Review of Systems:   Pertinent items are noted in HPI Denies Abnormal vaginal discharge w/ itching/odor/irritation, headaches, visual changes, shortness of breath, chest pain, abdominal pain, severe nausea/vomiting, or problems with urination or bowel movements. Pertinent History Reviewed:  Reviewed past medical,surgical, obstetrical and family history.  Reviewed problem list, medications and allergies. OB History  Gravida Para Term Preterm AB Living  10 8 8  0 2 8  SAB IAB Ectopic Multiple Live Births  1 1 0 0 8    # Outcome Date GA Lbr Len/2nd Weight Sex Type Anes PTL Lv  10 Term 03/30/24 [redacted]w[redacted]d 21:31 / 00:34 8 lb 12.4  oz (3.98 kg) F Vag-Spont None  LIV  9 Term 07/09/21 [redacted]w[redacted]d 06:05 / 00:18 7 lb 12.5 oz (3.53 kg) M Vag-Spont None  LIV     Birth Comments: None  8 SAB 01/15/20     SAB     7 Term 08/14/19 [redacted]w[redacted]d 05:12 / 00:05 7 lb 9.3 oz (3.44 kg) M Vag-Spont None  LIV     Complications: Gestational diabetes  6 Term 09/19/18 [redacted]w[redacted]d 02:55 / 00:09 7 lb 13 oz (3.544 kg) F Vag-Spont EPI  LIV     Complications: Gestational diabetes  5 Term 08/26/17 [redacted]w[redacted]d 01:27 / 00:15 8 lb 12.2 oz (3.975 kg) M Vag-Spont EPI N LIV  4 Term 11/21/15 [redacted]w[redacted]d  8 lb 13 oz (3.997 kg) M Vag-Spont EPI N LIV  3 Term 12/18/13 [redacted]w[redacted]d 14:05 / 01:03 7 lb 9 oz (3.43 kg) M Vag-Spont EPI N LIV     Birth Comments: caput  2 Term 05/28/10 [redacted]w[redacted]d  8 lb 7 oz (3.827 kg) M Vag-Vacuum EPI N LIV     Birth Comments: states induced early due to PUPPS  1 IAB 2012           Physical Assessment:   Vitals:   05/07/24 1038 05/07/24 1124  BP: 139/87 117/83  Pulse: 86 74  Weight: 162 lb (73.5 kg)   Height: 5' 4 (1.626 m)   Body mass index is 27.81 kg/m.       Physical Examination:   General appearance: alert, well appearing, and in no distress  Mental status: alert, oriented to person, place, and time  Skin: warm & dry   Cardiovascular: normal heart rate noted   Respiratory: normal respiratory effort, no distress   Breasts: deferred, no complaints   Abdomen: soft, non-tender   Pelvic: examination not indicated. Thin prep pap obtained: No  Rectal: not examined  Extremities: Edema: none   Chaperone: N/A       No results found for this or any previous visit (from the past 24 hours).  Assessment & Plan:  1) Postpartum exam 2) 5 wks s/p spontaneous vaginal delivery 3) bottle feeding 4) Depression screening 5) Contraception management: rx Slynd  to [facility], 1 sample given, condoms x 2wks. Check HPT x next 2wks, if positive let us  know (unprotected sex 2d ago).  6) Immediate pp PE> continue eloquis as directed (total 90d) 7) Dizziness, seeing spots>  entire pregnancy, improved briefly pp, but has returned. Discussed w/ [facility], recommends MRI, pt ok w/ this, ordered and note routed to [facility] to schedule/call pt w/ date time. Has appt w/ neuro in March as well 8) H/O IDA> hgb 11.3 (12/3)  Essential components of care per ACOG recommendations:  1.  Mood  and well being:  If positive depression screen, discussed and plan developed.  If using tobacco we discussed reduction/cessation and risk of relapse If current substance abuse, we discussed and referral to local resources was offered.   2. Infant care and feeding:  If breastfeeding, discussed returning to work, pumping, breastfeeding-associated pain, guidance regarding return to fertility while lactating if not using another method. If needed, patient was provided with a letter to be allowed to pump q 2-3hrs to support lactation in a private location with access to a refrigerator to store breastmilk.   Recommended that all caregivers be immunized for flu, pertussis and other preventable communicable diseases If pt does not have material needs met for her/baby, referred to local resources for help obtaining these.  3. Sexuality, contraception and birth spacing Provided guidance regarding sexuality, management of dyspareunia, and resumption of intercourse Discussed avoiding interpregnancy interval <38mths and recommended birth spacing of 18 months  4. Sleep and fatigue Discussed coping options for fatigue and sleep disruption Encouraged family/partner/community support of 4 hrs of uninterrupted sleep to help with mood and fatigue  5. Physical recovery  If pt had a C/S, assessed incisional pain and providing guidance on normal vs prolonged recovery If pt had a laceration, perineal healing and pain reviewed.  If urinary or fecal incontinence, discussed management and referred to PT or uro/gyn if indicated  Patient is safe to resume physical activity. Discussed attainment of healthy weight.  6.   Chronic disease management Discussed pregnancy complications if any, and their implications for future childbearing and long-term maternal health. Review recommendations for prevention of recurrent pregnancy complications, such as 17 hydroxyprogesterone caproate to reduce risk for recurrent PTB not applicable, or aspirin to reduce risk of preeclampsia not applicable. Pt had GDM: no. If yes, 2hr GTT scheduled: not applicable. Reviewed medications and non-pregnant dosing including consideration of whether pt is breastfeeding using a reliable resource such as LactMed: not applicable Referred for f/u w/ PCP or subspecialist providers as indicated: yes  7. Health maintenance Mammogram at 33yo or earlier if indicated Pap smears as indicated  Meds:  Meds ordered this encounter  Medications   Drospirenone  (SLYND ) 4 MG TABS    Sig: Take 1 tablet (4 mg total) by mouth daily.    Dispense:  90 tablet    Refill:  3    Follow-up: Return in about 1 year (around 05/07/2025) for Physical.   Orders Placed This Encounter  Procedures   MR BRAIN W WO CONTRAST    Suzen JONELLE Fetters CNM, [facility] 05/07/2024 12:41 PM   CT "

## 2024-05-12 ENCOUNTER — Other Ambulatory Visit: Payer: Self-pay

## 2024-05-12 ENCOUNTER — Inpatient Hospital Stay (HOSPITAL_COMMUNITY)
Admission: AD | Admit: 2024-05-12 | Discharge: 2024-05-13 | Disposition: A | Discharge status: Home / Self Care | Payer: MEDICAID | Attending: Obstetrics and Gynecology | Admitting: Obstetrics and Gynecology

## 2024-05-12 DIAGNOSIS — R42 Dizziness and giddiness: Secondary | ICD-10-CM | POA: Insufficient documentation

## 2024-05-12 DIAGNOSIS — I2699 Other pulmonary embolism without acute cor pulmonale: Secondary | ICD-10-CM | POA: Insufficient documentation

## 2024-05-12 DIAGNOSIS — N920 Excessive and frequent menstruation with regular cycle: Secondary | ICD-10-CM | POA: Insufficient documentation

## 2024-05-12 DIAGNOSIS — O8823 Thromboembolism in the puerperium: Secondary | ICD-10-CM | POA: Insufficient documentation

## 2024-05-12 DIAGNOSIS — Z7901 Long term (current) use of anticoagulants: Secondary | ICD-10-CM | POA: Insufficient documentation

## 2024-05-12 DIAGNOSIS — O9089 Other complications of the puerperium, not elsewhere classified: Secondary | ICD-10-CM | POA: Diagnosis not present

## 2024-05-12 DIAGNOSIS — R0602 Shortness of breath: Secondary | ICD-10-CM | POA: Diagnosis present

## 2024-05-12 LAB — CBC
HCT: 37.8 % (ref 36.0–46.0)
Hemoglobin: 12.1 g/dL (ref 12.0–15.0)
MCH: 28.2 pg (ref 26.0–34.0)
MCHC: 32 g/dL (ref 30.0–36.0)
MCV: 88.1 fL (ref 80.0–100.0)
Platelets: 268 K/uL (ref 150–400)
RBC: 4.29 MIL/uL (ref 3.87–5.11)
RDW: 13.3 % (ref 11.5–15.5)
WBC: 6.4 K/uL (ref 4.0–10.5)
nRBC: 0 % (ref 0.0–0.2)

## 2024-05-12 LAB — D-DIMER, QUANTITATIVE: D-Dimer, Quant: <0.27 ug{FEU}/mL (ref 0.00–0.50)

## 2024-05-12 LAB — PRO BRAIN NATRIURETIC PEPTIDE: Pro Brain Natriuretic Peptide: <50 pg/mL

## 2024-05-12 NOTE — MAU Provider Note (Signed)
 " History     CSN: [id number]  Arrival date and time: 05/12/24 2126   Event Date/Time   First Provider Initiated Contact with Patient 05/12/24 2339      Chief Complaint  Patient presents with   Vaginal Bleeding   HPI Patricia Richards is a 33 y.o. [id number] female at 6 weeks post partum who presents for vaginal bleeding. Menses started yesterday. Reports that she moderately fills a pad every hour but not saturating pads or passing clots. Currently on anticoagulation due to PE diagnosed in PP Period.  Also reports continued SOB & dizziness. Has hx of vertigo, has not been taking meds & is supposed to be scheduled for imaging. SOB has been ongoing since PE diagnosis.   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 ,gets off street   Panic attacks    Pulmonary embolus (HCC)    Tachycardia    Urinary tract infection    gets them frequently   Vaginal Pap smear, abnormal     Past Surgical History:  Procedure Laterality Date   DILATION AND EVACUATION  11/28/2010   WISDOM TOOTH EXTRACTION      Family History  Problem Relation Age of Onset   Heart attack Paternal Grandmother    Cancer Maternal Grandmother    Mental illness Maternal Grandmother    Stroke Maternal Grandmother    Hyperlipidemia Father    Hypertension Father    Cancer Father        skin   Mental illness Father    Diabetes Father    Cerebral palsy Mother    Other Brother        problems with gums   Down syndrome Cousin    Bipolar disorder Cousin     Social  History[1]  Allergies: Allergies[2]  No medications prior to admission.    Review of Systems  All other systems reviewed and are negative.  Physical Exam   Blood pressure 115/81, pulse 79, temperature (!) 97.4 F (36.3 C), temperature source Oral, resp. rate 18, height 5' 4 (1.626 m), weight 70.3 kg, SpO2 99%, not currently breastfeeding.  Physical Exam Vitals and nursing note reviewed.  Constitutional:      General: She is not in acute distress.    Appearance: She is well-developed. She is not ill-appearing.  HENT:     Head: Normocephalic and atraumatic.  Eyes:     General: No scleral icterus.       Right eye: No discharge.        Left eye: No discharge.     Conjunctiva/sclera: Conjunctivae normal.  Pulmonary:     Effort: Pulmonary effort is normal. No respiratory distress.  Neurological:     General:  No focal deficit present.     Mental Status: She is alert.  Psychiatric:        Mood and Affect: Mood normal.        Behavior: Behavior normal.     MAU Course  Procedures Results for orders placed or performed during the hospital encounter of 05/12/24 (from the past 24 hours)  CBC     Status: None   Collection Time: 05/12/24 11:22 PM  Result Value Ref Range   WBC 6.4 4.0 - 10.5 K/uL   RBC 4.29 3.87 - 5.11 MIL/uL   Hemoglobin 12.1 12.0 - 15.0 g/dL   HCT 62.1 63.9 - 53.9 %   MCV 88.1 80.0 - 100.0 fL   MCH 28.2 26.0 - 34.0 pg   MCHC 32.0 30.0 - 36.0 g/dL   RDW 86.6 88.4 - 84.4 %   Platelets 268 150 - 400 K/uL   nRBC 0.0 0.0 - 0.2 %  D-dimer, quantitative     Status: None   Collection Time: 05/12/24 11:22 PM  Result Value Ref Range   D-Dimer, Quant <0.27 0.00 - 0.50 ug/mL-FEU  Pro Brain natriuretic peptide     Status: None   Collection Time: 05/12/24 11:22 PM  Result Value Ref Range   Pro Brain Natriuretic Peptide <50.0 <300.0 pg/mL    MDM   Assessment and Plan   1. Menorrhagia with regular cycle   2. Vertigo    -Hemoglobin stable at 12.1. Platelets  normal -BNP & D dimer normal. Lung sounds clear. VSS.  -Patient declined management of vertigo with plans to follow up outpatient. As she just had appointment & this week was a holiday, expect to have imaging scheduled in the coming weeks.  -Discussed management of VB. Encouraged to start slynd . Will f/u outpatient if bleeding doesn't  improve.   Rocky Satterfield NP 05/13/2024, 1:24 AM      [1]  Social History Tobacco Use   Smoking status: Former    Current packs/day: 0.00    Types: Cigarettes    Quit date: 12/15/2020    Years since quitting: 3.4   Smokeless tobacco: Never  Vaping Use   Vaping status: Former  Substance Use Topics   Alcohol use: Not Currently    Alcohol/week: 12.0 standard drinks of alcohol    Types: 12 Cans of beer per week    Comment: During pregnancy, months ago. Malt liquour/liquor   Drug use: Not Currently    Types: Other-see comments, Marijuana, Cocaine, MDMA (Ecstacy)    Comment: none since April 2018  [2]  Allergies Allergen Reactions   Procardia  Xl [Nifedipine  Er] Itching   Tramadol  Other (See Comments)    CAUSES REALLY BAD MOOD SWINGS AND HALLUCINATIONS   Reglan [Metoclopramide Hcl] Other (See Comments)    hallucinations   Toradol  [Ketorolac  Tromethamine ] Other (See Comments)    Hallucinations    "

## 2024-05-12 NOTE — MAU Note (Addendum)
 Patricia Richards is a 33 y.o. at Unknown here in MAU reporting: heavy bleeding that started yesterday stating that she was filling a pad every two hours. Reports that she has been feeling dizzy and seeing spots since the bleeding started. Also reports shortness of breath for about 2 weeks. 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 Denies abdominal pain, vaginal discharge, or odor.  Onset of complaint: yesterday Pain score: 0/10 Vitals:   05/12/24 2216  BP: 124/87  Pulse: 91  Resp: 18  Temp: (!) 97.4 F (36.3 C)  SpO2: 99%     Lab orders placed from triage: none

## 2024-05-13 DIAGNOSIS — N920 Excessive and frequent menstruation with regular cycle: Secondary | ICD-10-CM | POA: Diagnosis not present

## 2024-05-22 ENCOUNTER — Ambulatory Visit (INDEPENDENT_AMBULATORY_CARE_PROVIDER_SITE_OTHER): Payer: MEDICAID | Admitting: Obstetrics & Gynecology

## 2024-05-22 ENCOUNTER — Encounter: Payer: Self-pay | Admitting: Obstetrics & Gynecology

## 2024-05-22 DIAGNOSIS — N814 Uterovaginal prolapse, unspecified: Secondary | ICD-10-CM

## 2024-05-22 DIAGNOSIS — O99893 Other specified diseases and conditions complicating puerperium: Secondary | ICD-10-CM | POA: Diagnosis not present

## 2024-05-22 NOTE — Progress Notes (Signed)
 Subjective:     Patricia Richards is a 34 y.o. female who presents for a postpartum visit. She is 7 weeks postpartum following a spontaneous vaginal delivery. I have fully reviewed the prenatal and intrapartum course. The delivery was at 40 gestational weeks. Outcome: spontaneous vaginal delivery. Anesthesia: none. Postpartum course has been normal. Baby's course has been normal. Baby is feeding by bottle. Bleeding no bleeding. Bowel function is normal. Bladder function is normal. Patient is sexually active. Contraception method is oral progesterone-only contraceptive. Postpartum depression screening: negative.  [id number]   Review of Systems Pertinent items are noted in HPI.   Objective:    BP 132/87 (BP Location: Right Arm, Patient Position: Sitting, Cuff Size: Normal)   Pulse 86   Ht 5' 4 (1.626 m)   Wt 162 lb (73.5 kg)   LMP 05/11/2024   Breastfeeding No   BMI 27.81 kg/m   General:  alert, cooperative, and no distress   Breasts:  inspection negative, no nipple discharge or bleeding, no masses or nodularity palpable  Lungs:   Heart:    Abdomen: Soft benign   Vulva:  normal  Vagina: not evaluated  Cervix:  absent Grade 2 relaxation  Corpus: not examined  Adnexa:  normal adnexa  Rectal Exam: Normal rectovaginal exam        Assessment:    Normal postpartum exam. Pap smear not done at today's visit.   Plan:    1. Contraception: oral progesterone-only contraceptive 2.  3. Follow up in: prn  or as needed.

## 2024-05-28 ENCOUNTER — Encounter: Payer: Self-pay | Admitting: Advanced Practice Midwife

## 2024-05-28 MED ORDER — APIXABAN 5 MG PO TABS: 5.0000 mg | ORAL_TABLET | Freq: Two times a day (BID) | ORAL | 4 refills | Status: AC

## 2024-06-04 ENCOUNTER — Encounter: Payer: Self-pay | Admitting: Women's Health

## 2024-06-04 ENCOUNTER — Other Ambulatory Visit: Payer: Self-pay | Admitting: Women's Health

## 2024-06-04 MED ORDER — PHEXXI 1.8-1-0.4 % VA GEL: 1.0000 | Freq: Once | VAGINAL | 11 refills | Status: AC

## 2024-06-08 ENCOUNTER — Emergency Department (HOSPITAL_COMMUNITY): Payer: MEDICAID

## 2024-06-08 ENCOUNTER — Emergency Department (HOSPITAL_COMMUNITY)
Admission: EM | Admit: 2024-06-08 | Discharge: 2024-06-08 | Disposition: A | Discharge status: Home / Self Care | Payer: MEDICAID | Attending: Emergency Medicine | Admitting: Emergency Medicine

## 2024-06-08 DIAGNOSIS — R002 Palpitations: Secondary | ICD-10-CM

## 2024-06-08 DIAGNOSIS — M79604 Pain in right leg: Secondary | ICD-10-CM

## 2024-06-08 DIAGNOSIS — R0602 Shortness of breath: Secondary | ICD-10-CM | POA: Insufficient documentation

## 2024-06-08 DIAGNOSIS — Z7901 Long term (current) use of anticoagulants: Secondary | ICD-10-CM | POA: Diagnosis not present

## 2024-06-08 DIAGNOSIS — R600 Localized edema: Secondary | ICD-10-CM | POA: Insufficient documentation

## 2024-06-08 LAB — COMPREHENSIVE METABOLIC PANEL WITH GFR
ALT: 24 U/L (ref 0–44)
AST: 17 U/L (ref 15–41)
Albumin: 4.7 g/dL (ref 3.5–5.0)
Alkaline Phosphatase: 57 U/L (ref 38–126)
Anion gap: 12 (ref 5–15)
BUN: 9 mg/dL (ref 6–20)
CO2: 25 mmol/L (ref 22–32)
Calcium: 9.4 mg/dL (ref 8.9–10.3)
Chloride: 103 mmol/L (ref 98–111)
Creatinine, Ser: 0.71 mg/dL (ref 0.44–1.00)
GFR, Estimated: >60 mL/min
Glucose, Bld: 83 mg/dL (ref 70–99)
Potassium: 3.8 mmol/L (ref 3.5–5.1)
Sodium: 140 mmol/L (ref 135–145)
Total Bilirubin: 0.4 mg/dL (ref 0.0–1.2)
Total Protein: 7.2 g/dL (ref 6.5–8.1)

## 2024-06-08 LAB — CBC WITH DIFFERENTIAL/PLATELET
Abs Immature Granulocytes: 0.01 K/uL (ref 0.00–0.07)
Basophils Absolute: 0 K/uL (ref 0.0–0.1)
Basophils Relative: 0 %
Eosinophils Absolute: 0 K/uL (ref 0.0–0.5)
Eosinophils Relative: 0 %
HCT: 38.1 % (ref 36.0–46.0)
Hemoglobin: 12.2 g/dL (ref 12.0–15.0)
Immature Granulocytes: 0 %
Lymphocytes Relative: 39 %
Lymphs Abs: 2.1 K/uL (ref 0.7–4.0)
MCH: 27.4 pg (ref 26.0–34.0)
MCHC: 32 g/dL (ref 30.0–36.0)
MCV: 85.6 fL (ref 80.0–100.0)
Monocytes Absolute: 0.2 K/uL (ref 0.1–1.0)
Monocytes Relative: 5 %
Neutro Abs: 3 K/uL (ref 1.7–7.7)
Neutrophils Relative %: 56 %
Platelets: 244 K/uL (ref 150–400)
RBC: 4.45 MIL/uL (ref 3.87–5.11)
RDW: 13.2 % (ref 11.5–15.5)
WBC: 5.4 K/uL (ref 4.0–10.5)
nRBC: 0 % (ref 0.0–0.2)

## 2024-06-08 LAB — D-DIMER, QUANTITATIVE: D-Dimer, Quant: <0.27 ug{FEU}/mL (ref 0.00–0.50)

## 2024-06-08 NOTE — Discharge Instructions (Signed)
 You were evaluated in the ED today for leg pain and palpitations and shortness of breath.  Your workup was overall very reassuring, genetically there was no sign of a blood clot in your leg or your lungs.  Follow-up close with your primary care doctor, come back to the ER for new or worsening symptoms.

## 2024-06-08 NOTE — ED Triage Notes (Signed)
 Pt comes in for palpitations and SOB. Pt has had right hip pain for a year and has worsen over the past couple of days. Pain radiates to right calf area.   Pt woke up in a cold sweat with palpations and sob. Every since, she has had sob.    Pt has hx of PE after giving birth. A&Ox4.   Pt wants to rule out a blood clot. Pt takes Eliquis .   A&Ox4.

## 2024-06-08 NOTE — ED Provider Notes (Signed)
 " [place] [facility] AT [facility] Provider Note   CSN: [id number] Arrival date & time: 06/08/24  1141     Patient presents with: Shortness of Breath   Patricia Richards is a 34 y.o. female. 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  Presents to ER today complaining of on and off right leg pain that has been worse over the past couple of days with tenderness of the calf and palpitations and shortness of breath that started last night, states she has had palpitations before but they usually last couple seconds, last night this lasted for about 30 seconds and she still feels short of breath.  Patient reports that she was diagnosed with PE after giving birth on 03/30/2024, states she had a follow-up CT on the 20th that showed no clot.  Confirmed with imaging.    Shortness of Breath      Prior to Admission medications  Medication Sig Start Date End Date Taking? Authorizing Provider  acetaminophen  (TYLENOL ) 325 MG tablet Take 2 tablets (650 mg total) by mouth every 4 (four) hours as needed for up to 180 doses (for pain scale < 4). 07/11/21   Gene Lucie BROCKS, CNM  apixaban  (ELIQUIS ) 5 MG TABS tablet Take 1 tablet (5 mg total) by mouth 2 (two) times daily. 05/28/24   Jayne Vonn DEL, MD  clonazePAM  (KLONOPIN ) 1 MG tablet Take 1 tablet (1 mg total) by mouth 3 (three) times daily. May fill on 05/05/24.  30 day supply 03/30/24   Cresenzo-Dishmon, Cathlean, CNM  pantoprazole  (PROTONIX ) 20 MG tablet TAKE ONE TABLET BY MOUTH ONCE DAILY 04/25/24   Jayne Vonn DEL, MD  scopolamine  (TRANSDERM-SCOP) 1 MG/3DAYS Place 1 patch (1 mg total) onto the skin every 3 (three) days. As needed for dizziness 05/07/24   Kizzie Suzen SAUNDERS, CNM    Allergies: Procardia  xl [nifedipine  er], Tramadol , Reglan [metoclopramide hcl], and Toradol  [ketorolac  tromethamine ]    Review of Systems  Respiratory:  Positive for shortness of  breath.     Updated Vital Signs BP 125/83 (BP Location: Right Arm)   Pulse 72   Temp (!) 97.5 F (36.4 C) (Oral)   Resp 16   Ht 5' 4 (1.626 m)   Wt 72.7 kg   LMP 05/11/2024   SpO2 100%   Breastfeeding No   BMI 27.50 kg/m   Physical Exam Vitals and nursing note reviewed.  Constitutional:      General: She is not in acute distress.    Appearance: She is well-developed.  HENT:     Head: Normocephalic and atraumatic.  Eyes:     Conjunctiva/sclera: Conjunctivae normal.  Cardiovascular:     Rate and Rhythm: Normal rate and regular rhythm.     Heart sounds: No murmur heard. Pulmonary:     Effort: Pulmonary effort is normal. No respiratory distress.     Breath sounds: Normal breath sounds.  Abdominal:     Palpations: Abdomen is soft.     Tenderness: There is no abdominal tenderness.  Musculoskeletal:        General: No swelling.     Cervical back: Neck supple.     Right lower leg: No tenderness. No edema.     Left lower leg: Tenderness present. Edema present.  Skin:    General: Skin is warm and dry.     Capillary Refill: Capillary refill takes less than 2 seconds.  Neurological:  General: No focal deficit present.     Mental Status: She is alert.  Psychiatric:        Mood and Affect: Mood normal.     (all labs ordered are listed, but only abnormal results are displayed) Labs Reviewed - No data to display  EKG: None  Radiology: No results found.   Procedures   Medications Ordered in the ED - No data to display                                  Medical Decision Making Differential diagnosis includes but limited to DVT, PE, pneumonia, asthma exacerbation, anxiety, other  Course: Patient presents with concern for possible blood clot due to pain in the right calf and some palpitations that occurred last night lasting about 30 seconds with associated some shortness of breath.  No cough, no wheezing, she is well-appearing she is not hypoxic and has been  compliant with her anticoagulants.  Ultrasound was negative for DVT, discussed the very low likelihood of PE but patient requested D-dimer for reassurance due to history of PE, this was ordered and is negative at less than 0.27, CBC and CMP are also normal, her chest x-ray showed no pulmonary edema or infiltrate.  Advised on follow-up and return precautions.  Amount and/or Complexity of Data Reviewed External Data Reviewed: labs, radiology and notes. Labs: ordered. Radiology: ordered.        Final diagnoses:  None    ED Discharge Orders     None          Suellen Sherran LABOR, [place] 06/08/24 1750  "

## 2024-06-20 ENCOUNTER — Encounter: Payer: Self-pay | Admitting: Advanced Practice Midwife

## 2024-07-18 ENCOUNTER — Ambulatory Visit: Payer: MEDICAID | Admitting: Neurology

## 2024-08-02 ENCOUNTER — Ambulatory Visit: Payer: MEDICAID | Admitting: Neurology

## 2024-10-11 ENCOUNTER — Ambulatory Visit: Payer: MEDICAID | Admitting: Dermatology
